# Patient Record
Sex: Female | Born: 1950
Health system: Southern US, Community
[De-identification: ages and names within clinical notes are randomized; demographics above are authoritative.]

## PROBLEM LIST (undated history)

## (undated) DIAGNOSIS — H547 Unspecified visual loss: Secondary | ICD-10-CM

## (undated) DIAGNOSIS — F209 Schizophrenia, unspecified: Secondary | ICD-10-CM

## (undated) DIAGNOSIS — E109 Type 1 diabetes mellitus without complications: Secondary | ICD-10-CM

## (undated) DIAGNOSIS — R42 Dizziness and giddiness: Secondary | ICD-10-CM

## (undated) DIAGNOSIS — K219 Gastro-esophageal reflux disease without esophagitis: Secondary | ICD-10-CM

## (undated) DIAGNOSIS — E785 Hyperlipidemia, unspecified: Secondary | ICD-10-CM

## (undated) DIAGNOSIS — G4730 Sleep apnea, unspecified: Secondary | ICD-10-CM

## (undated) DIAGNOSIS — I10 Essential (primary) hypertension: Secondary | ICD-10-CM

## (undated) DIAGNOSIS — I609 Nontraumatic subarachnoid hemorrhage, unspecified: Secondary | ICD-10-CM

## (undated) DIAGNOSIS — I480 Paroxysmal atrial fibrillation: Secondary | ICD-10-CM

## (undated) HISTORY — DX: Essential (primary) hypertension: I10

## (undated) HISTORY — DX: Unspecified visual loss: H54.7

## (undated) HISTORY — DX: Schizophrenia, unspecified: F20.9

## (undated) HISTORY — DX: Paroxysmal atrial fibrillation: I48.0

## (undated) HISTORY — DX: Nontraumatic subarachnoid hemorrhage, unspecified: I60.9

## (undated) HISTORY — DX: Hyperlipidemia, unspecified: E78.5

## (undated) HISTORY — DX: Dizziness and giddiness: R42

## (undated) HISTORY — DX: Sleep apnea, unspecified: G47.30

## (undated) HISTORY — PX: BRAIN TUMOR EXCISION: SHX577

## (undated) HISTORY — DX: Gastro-esophageal reflux disease without esophagitis: K21.9

## (undated) HISTORY — DX: Type 1 diabetes mellitus without complications: E10.9

---

## 1997-10-30 ENCOUNTER — Ambulatory Visit (HOSPITAL_COMMUNITY): Admission: RE | Admit: 1997-10-30 | Discharge: 1997-10-30 | Payer: Self-pay | Admitting: Cardiology

## 1998-04-30 ENCOUNTER — Encounter: Payer: Self-pay | Admitting: Neurological Surgery

## 1998-05-02 ENCOUNTER — Inpatient Hospital Stay (HOSPITAL_COMMUNITY): Admission: RE | Admit: 1998-05-02 | Discharge: 1998-05-14 | Payer: Self-pay | Admitting: Neurological Surgery

## 1998-05-03 ENCOUNTER — Encounter: Payer: Self-pay | Admitting: Neurological Surgery

## 1998-05-04 ENCOUNTER — Encounter: Payer: Self-pay | Admitting: Neurological Surgery

## 1998-06-08 ENCOUNTER — Encounter: Payer: Self-pay | Admitting: Emergency Medicine

## 1998-06-08 ENCOUNTER — Inpatient Hospital Stay (HOSPITAL_COMMUNITY): Admission: EM | Admit: 1998-06-08 | Discharge: 1998-06-13 | Payer: Self-pay | Admitting: Emergency Medicine

## 1998-07-31 ENCOUNTER — Encounter: Admission: RE | Admit: 1998-07-31 | Discharge: 1998-10-29 | Payer: Self-pay | Admitting: Radiation Oncology

## 1998-08-16 ENCOUNTER — Inpatient Hospital Stay (HOSPITAL_COMMUNITY): Admission: AD | Admit: 1998-08-16 | Discharge: 1998-08-22 | Payer: Self-pay | Admitting: *Deleted

## 1999-01-25 ENCOUNTER — Encounter: Payer: Self-pay | Admitting: Neurological Surgery

## 1999-01-25 ENCOUNTER — Ambulatory Visit (HOSPITAL_COMMUNITY): Admission: RE | Admit: 1999-01-25 | Discharge: 1999-01-25 | Payer: Self-pay | Admitting: Neurological Surgery

## 1999-02-11 ENCOUNTER — Encounter: Admission: RE | Admit: 1999-02-11 | Discharge: 1999-02-11 | Payer: Self-pay | Admitting: Infectious Diseases

## 1999-02-19 ENCOUNTER — Encounter: Admission: RE | Admit: 1999-02-19 | Discharge: 1999-02-19 | Payer: Self-pay | Admitting: Infectious Diseases

## 1999-02-20 ENCOUNTER — Encounter: Admission: RE | Admit: 1999-02-20 | Discharge: 1999-05-21 | Payer: Self-pay | Admitting: Radiation Oncology

## 1999-06-29 ENCOUNTER — Ambulatory Visit (HOSPITAL_COMMUNITY): Admission: RE | Admit: 1999-06-29 | Discharge: 1999-06-29 | Payer: Self-pay | Admitting: Radiation Oncology

## 1999-09-09 ENCOUNTER — Inpatient Hospital Stay (HOSPITAL_COMMUNITY): Admission: EM | Admit: 1999-09-09 | Discharge: 1999-09-11 | Payer: Self-pay | Admitting: *Deleted

## 2000-01-19 ENCOUNTER — Ambulatory Visit (HOSPITAL_COMMUNITY): Admission: RE | Admit: 2000-01-19 | Discharge: 2000-01-19 | Payer: Self-pay | Admitting: Radiation Oncology

## 2000-05-08 ENCOUNTER — Ambulatory Visit (HOSPITAL_COMMUNITY): Admission: RE | Admit: 2000-05-08 | Discharge: 2000-05-08 | Payer: Self-pay | Admitting: Radiation Oncology

## 2000-11-09 ENCOUNTER — Encounter: Payer: Self-pay | Admitting: Family Medicine

## 2000-11-09 ENCOUNTER — Ambulatory Visit (HOSPITAL_COMMUNITY): Admission: RE | Admit: 2000-11-09 | Discharge: 2000-11-09 | Payer: Self-pay | Admitting: Family Medicine

## 2002-05-01 ENCOUNTER — Encounter: Payer: Self-pay | Admitting: Family Medicine

## 2002-05-01 ENCOUNTER — Ambulatory Visit (HOSPITAL_COMMUNITY): Admission: RE | Admit: 2002-05-01 | Discharge: 2002-05-01 | Payer: Self-pay | Admitting: Family Medicine

## 2002-08-08 ENCOUNTER — Encounter: Admission: RE | Admit: 2002-08-08 | Discharge: 2002-08-08 | Payer: Self-pay | Admitting: Oncology

## 2003-03-12 ENCOUNTER — Emergency Department (HOSPITAL_COMMUNITY): Admission: EM | Admit: 2003-03-12 | Discharge: 2003-03-12 | Payer: Self-pay | Admitting: Internal Medicine

## 2003-06-21 ENCOUNTER — Ambulatory Visit (HOSPITAL_COMMUNITY): Admission: RE | Admit: 2003-06-21 | Discharge: 2003-06-21 | Payer: Self-pay | Admitting: Family Medicine

## 2003-10-17 ENCOUNTER — Encounter: Admission: RE | Admit: 2003-10-17 | Discharge: 2003-11-09 | Payer: Self-pay | Admitting: Oncology

## 2003-10-22 ENCOUNTER — Emergency Department (HOSPITAL_COMMUNITY): Admission: EM | Admit: 2003-10-22 | Discharge: 2003-10-22 | Payer: Self-pay | Admitting: Emergency Medicine

## 2003-11-20 ENCOUNTER — Ambulatory Visit: Payer: Self-pay | Admitting: Psychiatry

## 2003-12-06 ENCOUNTER — Ambulatory Visit (HOSPITAL_COMMUNITY): Admission: RE | Admit: 2003-12-06 | Discharge: 2003-12-06 | Payer: Self-pay | Admitting: General Surgery

## 2003-12-20 HISTORY — PX: US ECHOCARDIOGRAPHY: HXRAD669

## 2004-04-25 ENCOUNTER — Emergency Department (HOSPITAL_COMMUNITY): Admission: EM | Admit: 2004-04-25 | Discharge: 2004-04-25 | Payer: Self-pay | Admitting: Emergency Medicine

## 2004-05-15 ENCOUNTER — Ambulatory Visit: Payer: Self-pay | Admitting: Psychiatry

## 2004-10-27 ENCOUNTER — Ambulatory Visit (HOSPITAL_COMMUNITY): Admission: RE | Admit: 2004-10-27 | Discharge: 2004-10-27 | Payer: Self-pay | Admitting: General Surgery

## 2004-11-13 ENCOUNTER — Ambulatory Visit (HOSPITAL_COMMUNITY): Admission: RE | Admit: 2004-11-13 | Discharge: 2004-11-13 | Payer: Self-pay | Admitting: Family Medicine

## 2004-11-13 ENCOUNTER — Ambulatory Visit: Payer: Self-pay | Admitting: Psychiatry

## 2005-05-14 ENCOUNTER — Ambulatory Visit (HOSPITAL_COMMUNITY): Payer: Self-pay | Admitting: Psychiatry

## 2005-06-01 ENCOUNTER — Ambulatory Visit (HOSPITAL_COMMUNITY): Payer: Self-pay | Admitting: Oncology

## 2005-06-01 ENCOUNTER — Encounter: Admission: RE | Admit: 2005-06-01 | Discharge: 2005-06-01 | Payer: Self-pay | Admitting: Oncology

## 2005-06-10 ENCOUNTER — Encounter: Payer: Self-pay | Admitting: Cardiology

## 2005-08-13 ENCOUNTER — Ambulatory Visit (HOSPITAL_COMMUNITY): Payer: Self-pay | Admitting: Psychiatry

## 2005-11-16 ENCOUNTER — Ambulatory Visit (HOSPITAL_COMMUNITY): Admission: RE | Admit: 2005-11-16 | Discharge: 2005-11-16 | Payer: Self-pay | Admitting: Family Medicine

## 2005-11-17 ENCOUNTER — Ambulatory Visit (HOSPITAL_COMMUNITY): Payer: Self-pay | Admitting: Psychiatry

## 2006-03-02 ENCOUNTER — Ambulatory Visit (HOSPITAL_COMMUNITY): Payer: Self-pay | Admitting: Psychiatry

## 2006-05-25 ENCOUNTER — Ambulatory Visit (HOSPITAL_COMMUNITY): Payer: Self-pay | Admitting: Psychiatry

## 2006-08-17 ENCOUNTER — Ambulatory Visit (HOSPITAL_COMMUNITY): Payer: Self-pay | Admitting: Psychiatry

## 2006-11-11 ENCOUNTER — Ambulatory Visit (HOSPITAL_COMMUNITY): Payer: Self-pay | Admitting: Psychiatry

## 2006-11-19 ENCOUNTER — Ambulatory Visit (HOSPITAL_COMMUNITY): Admission: RE | Admit: 2006-11-19 | Discharge: 2006-11-19 | Payer: Self-pay | Admitting: Family Medicine

## 2006-12-01 ENCOUNTER — Ambulatory Visit (HOSPITAL_COMMUNITY): Admission: RE | Admit: 2006-12-01 | Discharge: 2006-12-01 | Payer: Self-pay | Admitting: Family Medicine

## 2007-02-01 ENCOUNTER — Ambulatory Visit (HOSPITAL_COMMUNITY): Payer: Self-pay | Admitting: Psychiatry

## 2007-02-01 HISTORY — PX: NM MYOCAR PERF WALL MOTION: HXRAD629

## 2007-04-28 ENCOUNTER — Ambulatory Visit (HOSPITAL_COMMUNITY): Payer: Self-pay | Admitting: Psychiatry

## 2007-06-06 ENCOUNTER — Ambulatory Visit (HOSPITAL_COMMUNITY): Admission: RE | Admit: 2007-06-06 | Discharge: 2007-06-06 | Payer: Self-pay | Admitting: Family Medicine

## 2007-07-21 ENCOUNTER — Ambulatory Visit (HOSPITAL_COMMUNITY): Payer: Self-pay | Admitting: Psychiatry

## 2007-11-24 ENCOUNTER — Ambulatory Visit (HOSPITAL_COMMUNITY): Payer: Self-pay | Admitting: Psychiatry

## 2008-03-20 ENCOUNTER — Ambulatory Visit (HOSPITAL_COMMUNITY): Payer: Self-pay | Admitting: Psychiatry

## 2008-07-12 ENCOUNTER — Ambulatory Visit (HOSPITAL_COMMUNITY): Payer: Self-pay | Admitting: Psychiatry

## 2008-10-30 ENCOUNTER — Ambulatory Visit (HOSPITAL_COMMUNITY): Payer: Self-pay | Admitting: Psychiatry

## 2009-01-29 ENCOUNTER — Ambulatory Visit (HOSPITAL_COMMUNITY): Payer: Self-pay | Admitting: Psychiatry

## 2009-04-25 ENCOUNTER — Ambulatory Visit (HOSPITAL_COMMUNITY): Payer: Self-pay | Admitting: Psychiatry

## 2009-07-23 ENCOUNTER — Ambulatory Visit (HOSPITAL_COMMUNITY): Payer: Self-pay | Admitting: Psychiatry

## 2009-10-17 ENCOUNTER — Ambulatory Visit (HOSPITAL_COMMUNITY): Payer: Self-pay | Admitting: Psychiatry

## 2009-11-21 ENCOUNTER — Ambulatory Visit (HOSPITAL_COMMUNITY): Payer: Self-pay | Admitting: Psychiatry

## 2010-01-23 ENCOUNTER — Ambulatory Visit (HOSPITAL_COMMUNITY): Payer: Self-pay | Admitting: Psychiatry

## 2010-04-22 ENCOUNTER — Encounter (HOSPITAL_COMMUNITY): Payer: Self-pay | Admitting: Psychiatry

## 2010-04-29 ENCOUNTER — Encounter (INDEPENDENT_AMBULATORY_CARE_PROVIDER_SITE_OTHER): Payer: BC Managed Care – PPO | Admitting: Psychiatry

## 2010-04-29 DIAGNOSIS — F333 Major depressive disorder, recurrent, severe with psychotic symptoms: Secondary | ICD-10-CM

## 2010-06-27 NOTE — Discharge Summary (Signed)
[place]. [facility]  Patient:    Rachel [name], Rachel [name]                 MRN: [id number] Adm. Date:  [id number] Disc. Date: [id number] Attending:  Lenise Herald H Dictator:   Marya Fossa, P.A. CC:         Lenise Herald, M.D.             Dr. Greta Doom, [place]                           Discharge Summary  ADDENDUM  DATE OF BIRTH:  [birth year]  A 2-D echocardiogram was performed on the patient before she left [facility].  This was read by Dr. Jenne Campus as normal LV size, normal systolic function with an EF of 60%.  Mild TR and mild MR.  Normal aortic valve. Mildly dilated and depressed RV systolic function.  No significant pericardial effusion. DD:  09/11/99 TD:  09/13/99 Job: [id number]

## 2010-06-27 NOTE — H&P (Signed)
Rachel [name], ZAHRADNIK          ACCOUNT NO.:  [id number]   MEDICAL RECORD NO.:  [id number]          PATIENT TYPE:  AMB   LOCATION:  DAY                           FACILITY:  [facility]   PHYSICIAN:  Jerolyn Shin C. Katrinka Blazing, M.D.   DATE OF BIRTH:  1950-12-02   DATE OF ADMISSION:  DATE OF DISCHARGE:  LH                                HISTORY & PHYSICAL   HISTORY OF PRESENT ILLNESS:  A 60 year old female referred for screening  colonoscopy.  The patient has no difficulty with bowel movements, except for  occasional episodes of constipation.  She has some gas pain.  There is no  nausea or vomiting.  No family history of colon cancer.   PAST HISTORY:  1.  Diabetes mellitus.  2.  Hypertension.  3.  Chronic atrial fibrillation.  4.  Schizophrenia.  5.  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   MEDICATIONS:  1.  Xanax.  2.  Paxil 30 mg daily.  3.  KCl 10 mEq daily.  4.  Diltiazem 180 mg daily.  5.  Pepcid 20 mg daily.  6.  Digoxin 0.125 mg daily.  7.  Aspirin 325 mg daily.  8.  Trazodone 50 mg q.h.s.  9.  Risperdal 1 mg q.h.s.  10. Neurontin 300 mg q.h.s.   SURGERY:  Decompression at the base of the brain for history of cytoma.   ALLERGIES:  1.  SULFA.  2.  IBUPROFEN.   PHYSICAL EXAMINATION:  VITAL SIGNS:  Blood pressure 120/80, pulse 76,  respirations 18, weight 212 pounds.  GENERAL:  The patient appears to be a somewhat withdrawn female with flat  affect.  She does __________.  HEENT:  Otherwise unremarkable.  NECK:  Supple.  CHEST:  Clear.  HEART:  Regular rate and rhythm without murmur, gallop, or rub.  ABDOMEN:  Soft, obese, nontender.  No masses.  EXTREMITIES:  No clubbing, cyanosis, or edema.  NEUROLOGIC:  Tight musculature in upper and lower extremities.  She had a  wide based, unsteady gait.  She has hyperreflexia in upper and lower  extremities.  There is no lateralizing motor weakness.   IMPRESSION:  1.  Need for screening colonoscopy.  2.  Diabetes mellitus.  3.   Hypertension.  4.  History of chronic atrial fibrillation.  5.  Schizophrenia.  6.  History of cytoma status post surgical decompression with radiation and      chemotherapy.   PLAN:  Screening colonoscopy.     Lero   LCS/MEDQ  D:  12/05/2003  T:  12/06/2003  Job:  [id number]

## 2010-06-27 NOTE — Discharge Summary (Signed)
[place]. [facility]  Patient:    Rachel [name], Rachel [name]                 MRN: [id number] Adm. Date:  [id number] Disc. Date: [id number] Attending:  Lenise Herald H Dictator:   Marya Fossa, P.A. CC:         Dr. Greta Doom, [place]   Discharge Summary  DATE OF BIRTH:  04/19/2050  ADMISSION DIAGNOSES: 1. New onset rapid atrial fibrillation, status post emergent direct current    cardioversion. 2. Cardiogenic shock, resolved. 3. Hypertension. 4. Schizophrenia. 5. History of brain tumor, status post resection and chemotherapy. 6. Insulin-dependent diabetes mellitus.  DISCHARGE DIAGNOSES: 1. Status post direct current cardioversion from rapid atrial fibrillation    with cardiogenic shock to normal sinus rhythm.  Not a Coumadin candidate    due to schizophrenia. 2. Cardiogenic shock, resolved. 3. Hypertension. 4. Schizophrenia. 5. History of brain tumor, status post resection and chemotherapy. 6. Insulin-dependent diabetes mellitus. 7. Hypokalemia, repleted.  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  She presented to the [facility] ER after having a syncopal episode while walking at home. It was witnessed.  The patient was lethargic and unable to fully answer questions.  In the emergency room, she was noted to be in atrial fibrillation with a rapid ventricular response with a ventricular rate 150-180 and systolic blood pressure of 70-80.  No history of coronary artery disease or arrhythmia per family.  No prior history of atrial fibrillation.  She does have a history of syncope in the past six months x 2 and told it was due to hypokalemia.  No chest pain.  Mild shortness of breath.  The patient underwent  emergency DC cardioversion x 1 with 300 joules to sinus tachycardia at 105-115 with a systolic pressure of 90 by Lenise Herald, M.D., at [facility] after being given Versed IV.  The patient was alert and oriented postprocedure and was to be transferred to [facility] for further management to rule out MI and check 2-D echocardiogram and TSH.  PROCEDURES:  Direct current cardioversion emergently at [facility] on September 09, 1999.  COMPLICATIONS:  None.  CONSULTATIONS:  Stefani Dama, M.D., to clear for anticoagulation.  HOSPITAL COURSE:  The patient was stabilized at [facility] after DC cardioversion for atrial fibrillation and hypotension and stabilized to sinus rhythm with stable blood pressure.  She was then transferred to [facility] to a telemetry bed.  Laboratory studies revealed a sodium of 137, potassium 3.7, BUN 8, creatinine 1.0, and glucose 156.  WBC 11.6, hemoglobin 11.4, platelets 192.  Cardiac enzymes negative, except for MB and mild troponin leak.  CK 82, 88, and 81. MB 8.5, 8.3, and 5.5.  Troponin I 0.29, 0.36, and 0.26.  Total cholesterol 155, triglycerides 53, HDL 68, LDL 76.  Drug screen positive for benzodiazepines.  TSH 1.36.  The patient remained stable and in sinus rhythm on p.o. Cardizem and IV heparin.  Digoxin p.o. was also started.  Stefani Dama, M.D., was called and he felt from a neurosurgical standpoint that the patient would be okay to go on anticoagulation status post craniotomy remotely.  However, because of her history of schizophrenia, Lenise Herald, M.D., felt that it  would be best to put the patient on aspirin only.  DISCHARGE MEDICATIONS:  1. Diltiazem CD 120 mg a day.  2. Enteric-coated aspirin 325 mg a day.  3. Trazodone 50 mg one-half a day.  4. Premarin 0.625 mg a day.  5. Provera 2.5 mg a day.  6. Pepcid 20 mg a day.  7. Paxil 30 mg a  day.  8. Lanoxin 0.125 mg a day.  9. Risperdal 1 mg a day. 10. Xanax 0.25 mg twice a day. 11. Insulin as at home.  SPECIAL INSTRUCTIONS:  She is to stop the Hyzaar as she had a K of 3.5 on September 10, 1999, which was treated with 40 mg of p.o. potassium.  FOLLOW-UP:  We will check a BMP and digoxin level as an outpatient next Wednesday.  Follow-up appointment with Lenise Herald, M.D., on September 30, 1999, at 11:55 a[place].  DIET:  She should follow a low-fat, low-cholesterol, low-salt, diabetic diet.  ACTIVITY:  She may perform activity as tolerated.  WOUND CARE:  She can apply hydrocortisone cream to her DC cardioversion burn if needed. DD:  09/11/99 TD:  09/13/99 Job: [id number]

## 2010-07-24 ENCOUNTER — Encounter (INDEPENDENT_AMBULATORY_CARE_PROVIDER_SITE_OTHER): Payer: BC Managed Care – PPO | Admitting: Psychiatry

## 2010-07-24 DIAGNOSIS — F333 Major depressive disorder, recurrent, severe with psychotic symptoms: Secondary | ICD-10-CM

## 2010-09-11 ENCOUNTER — Encounter (INDEPENDENT_AMBULATORY_CARE_PROVIDER_SITE_OTHER): Payer: BC Managed Care – PPO | Admitting: Psychiatry

## 2010-09-11 DIAGNOSIS — F333 Major depressive disorder, recurrent, severe with psychotic symptoms: Secondary | ICD-10-CM

## 2010-10-09 ENCOUNTER — Encounter (HOSPITAL_COMMUNITY): Payer: BC Managed Care – PPO | Admitting: Psychiatry

## 2010-10-28 ENCOUNTER — Encounter (INDEPENDENT_AMBULATORY_CARE_PROVIDER_SITE_OTHER): Payer: BC Managed Care – PPO | Admitting: Psychiatry

## 2010-10-28 DIAGNOSIS — F333 Major depressive disorder, recurrent, severe with psychotic symptoms: Secondary | ICD-10-CM

## 2010-11-27 ENCOUNTER — Encounter (INDEPENDENT_AMBULATORY_CARE_PROVIDER_SITE_OTHER): Payer: BC Managed Care – PPO | Admitting: Psychiatry

## 2010-11-27 DIAGNOSIS — F333 Major depressive disorder, recurrent, severe with psychotic symptoms: Secondary | ICD-10-CM

## 2011-01-15 ENCOUNTER — Ambulatory Visit (INDEPENDENT_AMBULATORY_CARE_PROVIDER_SITE_OTHER): Payer: BC Managed Care – PPO | Admitting: Psychiatry

## 2011-01-15 DIAGNOSIS — F331 Major depressive disorder, recurrent, moderate: Secondary | ICD-10-CM

## 2011-01-15 MED ORDER — PAROXETINE HCL 40 MG PO TABS: 40.0000 mg | ORAL_TABLET | Freq: Every day | ORAL | Status: DC

## 2011-01-15 MED ORDER — TRAZODONE HCL 150 MG PO TABS: 150.0000 mg | ORAL_TABLET | Freq: Every day | ORAL | Status: DC

## 2011-01-15 MED ORDER — ALPRAZOLAM 0.25 MG PO TABS: 0.2500 mg | ORAL_TABLET | Freq: Three times a day (TID) | ORAL | Status: DC | PRN

## 2011-01-15 NOTE — Progress Notes (Signed)
Patient came for her followup appointment. She comes with her husband. She is now taking Risperdal 1 mg at bedtime. As per husband patient is doing better on her current medication. She is still uses her left to 2 mg Risperdal which was given few months ago. Since her dose has been decreased she is using remaining Risperdal in half. She denies any anger or agitation or mood swings. She continues to have nightmare but they are less intense and less frequent. She reported no side effects of medication. Her blood pressure and blood sugar has been improved from the past. She reported no side effects of medication.  Mental status examination Patient is casually dressed and fairly groomed. Patient is legally blind in need her husband support and assistance for walking. She described her mood is anxious and her affect is mood congruent. She denies any active or passive suicidal thinking and homicidal thinking. Her thought process is logical linear and goal-directed. There are no psychotic symptoms present. She denies any auditory or visual hallucination. She's alert and oriented x3 her insight judgment and impulse control is okay. There no tremors shakes or extrapyramidal side effects noted.  Assessment Maj. depressive disorder with psychotic features and complete remission  Plan I recommended to try Risperdal 0.5 mg. Eventually we will discontinue her Risperdal a few months. Patient will continue her Xanax Paxil and trazodone at present dosage. A new prescription of all 3 medications are given. I explained risks and benefits of medication in detail. I recommended to call if she has any question or concern about the medication or if she start to feel worsening of her depression or any time having paranoid thinking or hallucinations. I will see her again in 3 months

## 2011-01-23 ENCOUNTER — Other Ambulatory Visit (HOSPITAL_COMMUNITY): Payer: Self-pay | Admitting: Psychiatry

## 2011-02-19 ENCOUNTER — Encounter (HOSPITAL_COMMUNITY): Payer: BC Managed Care – PPO | Admitting: Psychiatry

## 2011-02-19 ENCOUNTER — Ambulatory Visit (HOSPITAL_COMMUNITY): Payer: BC Managed Care – PPO | Admitting: Psychiatry

## 2011-03-05 ENCOUNTER — Ambulatory Visit (HOSPITAL_COMMUNITY)
Admission: RE | Admit: 2011-03-05 | Discharge: 2011-03-05 | Disposition: A | Discharge status: Home / Self Care | Payer: BC Managed Care – PPO | Source: Ambulatory Visit | Attending: Family Medicine | Admitting: Family Medicine

## 2011-03-05 ENCOUNTER — Other Ambulatory Visit (HOSPITAL_COMMUNITY): Payer: Self-pay | Admitting: Family Medicine

## 2011-03-05 DIAGNOSIS — R52 Pain, unspecified: Secondary | ICD-10-CM

## 2011-03-05 DIAGNOSIS — S199XXA Unspecified injury of neck, initial encounter: Secondary | ICD-10-CM | POA: Insufficient documentation

## 2011-03-05 DIAGNOSIS — W19XXXA Unspecified fall, initial encounter: Secondary | ICD-10-CM | POA: Insufficient documentation

## 2011-03-05 DIAGNOSIS — R221 Localized swelling, mass and lump, neck: Secondary | ICD-10-CM | POA: Insufficient documentation

## 2011-03-05 DIAGNOSIS — S0993XA Unspecified injury of face, initial encounter: Secondary | ICD-10-CM | POA: Insufficient documentation

## 2011-03-05 DIAGNOSIS — R220 Localized swelling, mass and lump, head: Secondary | ICD-10-CM | POA: Insufficient documentation

## 2011-03-05 DIAGNOSIS — R51 Headache: Secondary | ICD-10-CM | POA: Insufficient documentation

## 2011-04-14 ENCOUNTER — Ambulatory Visit (INDEPENDENT_AMBULATORY_CARE_PROVIDER_SITE_OTHER): Payer: BC Managed Care – PPO | Admitting: Psychiatry

## 2011-04-14 DIAGNOSIS — F329 Major depressive disorder, single episode, unspecified: Secondary | ICD-10-CM

## 2011-04-14 MED ORDER — ALPRAZOLAM 0.25 MG PO TABS: 0.2500 mg | ORAL_TABLET | Freq: Three times a day (TID) | ORAL | Status: DC | PRN

## 2011-04-14 MED ORDER — PAROXETINE HCL 40 MG PO TABS: 40.0000 mg | ORAL_TABLET | Freq: Every day | ORAL | Status: DC

## 2011-04-14 MED ORDER — RISPERIDONE 1 MG PO TABS: 1.0000 mg | ORAL_TABLET | Freq: Every day | ORAL | Status: DC

## 2011-04-14 MED ORDER — TRAZODONE HCL 150 MG PO TABS: 150.0000 mg | ORAL_TABLET | Freq: Every day | ORAL | Status: DC

## 2011-04-14 NOTE — Progress Notes (Signed)
Patient came for her followup appointment with her husband. She tried reducing Risperdal to 0.5 mg however she cannot sleep very well. She complained of racing thoughts nervousness and her depression getting worse. Patient is back on 1 mg of Risperdal. She is doing much better with 1 mg.  She denies any agitation anger or mood swings. She denies any paranoid thinking. There no tremors or shakes present. She has recently seen her primary care physician and now taking digoxin, Crestor and Diovan. I review her blood work which was done in January shows blood glucose of 163. She's also taking insulin husband is very concerned with lowering Risperdal and does not want to change the dose. Her mood and depression as much stable with 1 mg Risperdal.  Current psychiatric medication Paxil 40 mg daily Trazodone 150 mg at bedtime Xanax 0.25 mg 3 times a day Risperdal 1 mg at bedtime  Mental status examination Patient is casually dressed and fairly groomed. Patient is legally blind and need her husband`s support and assistance for walking. She described her mood is neutral and her affect is mood congruent. She denies any active or passive suicidal thinking and homicidal thinking. Her thought process is logical linear and goal-directed. There are no psychotic symptoms present. She denies any auditory or visual hallucination. She's alert and oriented x3 her insight judgment and impulse control is okay. There no tremors shakes or extrapyramidal side effects noted.  Assessment Axis I Maj. depressive disorder with psychotic features Axis II deferred Axis III see medical history Axis IV mild to moderate   Plan I will increase Risperdal to 1 mg to target the psychosis and depressive symptoms. At this time patient is not ready to take low dose of Risperdal. I encourage her to control her blood sugar with diet exercise and compliant with her insulin. Her weight is unchanged from the past. I've explained risks and  benefits of medication. I recommended to call if she has any question or concern about the medication or if she start to feel worsening of her depression or any time having paranoid thinking or hallucinations. I will see her again in 3 months

## 2011-07-14 ENCOUNTER — Ambulatory Visit (INDEPENDENT_AMBULATORY_CARE_PROVIDER_SITE_OTHER): Payer: BC Managed Care – PPO | Admitting: Psychiatry

## 2011-07-14 ENCOUNTER — Encounter (HOSPITAL_COMMUNITY): Payer: Self-pay | Admitting: Psychiatry

## 2011-07-14 VITALS — Wt 196.0 lb

## 2011-07-14 DIAGNOSIS — F329 Major depressive disorder, single episode, unspecified: Secondary | ICD-10-CM

## 2011-07-14 MED ORDER — ALPRAZOLAM 0.25 MG PO TABS: 0.2500 mg | ORAL_TABLET | Freq: Three times a day (TID) | ORAL | Status: DC | PRN

## 2011-07-14 MED ORDER — PAROXETINE HCL 40 MG PO TABS: 40.0000 mg | ORAL_TABLET | Freq: Every day | ORAL | Status: DC

## 2011-07-14 MED ORDER — RISPERIDONE 1 MG PO TABS: 1.0000 mg | ORAL_TABLET | Freq: Every day | ORAL | Status: DC

## 2011-07-14 MED ORDER — TRAZODONE HCL 150 MG PO TABS: 150.0000 mg | ORAL_TABLET | Freq: Every day | ORAL | Status: DC

## 2011-07-14 NOTE — Progress Notes (Signed)
Chief complaint Medication management and followup.  History of presenting illness Patient is 60 year-old Caucasian unemployed married female who came with her husband for her followup appointment .  Patient is legally blind.  She require support and assistance .  Her husband usually comes with her on appointments.  Patient is overall doing better on her current psychiatric medication.  She denies any recent agitation anger or any hallucination.  She has chronic insomnia however her paranoia and depression has been stable.  We had tried cutting down her Risperdal in the past however she started to have racing thoughts and paranoid thinking.  She is taking 1 mg of Risperdal and overall she is doing better.  She has no tremors or shakes.  Her last blood work was done in January 2013 by Dr. Loleta Chance.  Her glucose was 163 however her creatinine was normal get she has been seeing her primary care physician regularly.  Her last hemoglobin A1c was 8.3 which was done in August 2013.  Patient is scheduled to see her primary care physician in few weeks.  Her weight is 196 pounds.  Current psychiatric medication Paxil 40 mg daily Trazodone 150 mg at bedtime Xanax 0.25 mg 3 times a day Risperdal 1 mg at bedtime  Past psychiatric history Patient has been seeing in this office since 1998.  She was admitted at [facility] due to severe depression and psychotic features.  At that time she was having suicidal thoughts .  Since then she has been stable on current psychiatric medication.  Medical history Patient has history of vertigo, GERD, hyperlipidemia, diabetes mellitus.  Patient is also legally blind.  She see Dr. Jorge Ny for her annual checkup.  Mental status examination Patient is casually dressed and fairly groomed. Patient is legally blind and need her husband`s support and assistance for walking. She described her mood is neutral and her affect is mood congruent. She denies any active or passive  suicidal thinking and homicidal thinking. Her thought process is logical linear and goal-directed. There are no psychotic symptoms present. She denies any auditory or visual hallucination.  She has poverty of thought content.  She's alert and oriented x3 her insight judgment and impulse control is okay. There no tremors shakes or extrapyramidal side effects noted.  Assessment Axis I Maj. depressive disorder with psychotic features Axis II deferred Axis III see medical history Axis IV mild to moderate   Plan I will continue her current psychiatric medication.  Patient does not want to reduce her medication at this time.  I recommend to call us if she is a question or concern about the medication or if she feels worsening of the symptoms.  I will see her again in 3 months.   i

## 2011-10-01 ENCOUNTER — Encounter (HOSPITAL_COMMUNITY): Payer: Self-pay | Admitting: Psychiatry

## 2011-10-01 ENCOUNTER — Ambulatory Visit (INDEPENDENT_AMBULATORY_CARE_PROVIDER_SITE_OTHER): Payer: BC Managed Care – PPO | Admitting: Psychiatry

## 2011-10-01 DIAGNOSIS — F323 Major depressive disorder, single episode, severe with psychotic features: Secondary | ICD-10-CM

## 2011-10-01 DIAGNOSIS — F329 Major depressive disorder, single episode, unspecified: Secondary | ICD-10-CM

## 2011-10-01 MED ORDER — TRAZODONE HCL 150 MG PO TABS: 150.0000 mg | ORAL_TABLET | Freq: Every day | ORAL | Status: DC

## 2011-10-01 MED ORDER — PAROXETINE HCL 40 MG PO TABS: 40.0000 mg | ORAL_TABLET | Freq: Every day | ORAL | Status: DC

## 2011-10-01 MED ORDER — ALPRAZOLAM 0.25 MG PO TABS: 0.2500 mg | ORAL_TABLET | Freq: Three times a day (TID) | ORAL | Status: DC | PRN

## 2011-10-01 MED ORDER — RISPERIDONE 1 MG PO TABS: 1.0000 mg | ORAL_TABLET | Freq: Every day | ORAL | Status: DC

## 2011-10-01 NOTE — Progress Notes (Signed)
Chief complaint Medication management and followup.  History of presenting illness Patient came for her followup appointment with her husband.  She's been compliant with the medication and reported no side effects.  She's been seeing primary care physician Dr. Loleta Chance for management of diabetes.  She is scheduled to have blood work in October.  She likes her current psychiatric medication.  She has chronic insomnia.  She's also legally blind and require her husband support for walking.  Her husband is very supportive.  She denies any recent agitation anger mood swing.  She denies any recent paranoia or any hallucination.  She is no tremors or shakes.  Her last blood work which was done in January 2000 13 shows hemoglobin A1c 8.3.  She will have another blood work in October this year.  Current psychiatric medication Paxil 40 mg daily Trazodone 150 mg at bedtime Xanax 0.25 mg 3 times a day Risperdal 1 mg at bedtime  Past psychiatric history Patient has been seeing in this office since 1998.  She was admitted at [facility] due to severe depression and psychotic features.  At that time she was having suicidal thoughts .  Since then she has been stable on current psychiatric medication.  Medical history Patient has history of vertigo, GERD, hyperlipidemia, diabetes mellitus.  Patient is also legally blind.  She see Dr. Jorge Ny for her annual checkup.  Mental status examination Patient is casually dressed and fairly groomed. Patient is legally blind and need her husband`s support and assistance for walking. She described her mood is neutral and her affect is mood congruent. She denies any active or passive suicidal thinking and homicidal thinking. Her thought process is logical linear and goal-directed. There are no psychotic symptoms present. She denies any auditory or visual hallucination.  She has poverty of thought content.  She's alert and oriented x3 her insight judgment and impulse control  is okay. There no tremors shakes or extrapyramidal side effects noted.  Assessment Axis I Maj. depressive disorder with psychotic features Axis II deferred Axis III see medical history Axis IV mild to moderate   Plan I review her chart and last progress note.  She is fairly stable on her current psychiatric medication.  House continue her current psychiatric medication.  I encourage her to have her blood work faxed to [place] in the future.  I explained the risks and benefits of medication recommend to call us if she is a question or concern about the medication or if she feels worsening of the symptoms.  I will see her again in 3 months.

## 2011-12-29 ENCOUNTER — Ambulatory Visit (INDEPENDENT_AMBULATORY_CARE_PROVIDER_SITE_OTHER): Payer: BC Managed Care – PPO | Admitting: Psychiatry

## 2011-12-29 ENCOUNTER — Encounter (HOSPITAL_COMMUNITY): Payer: Self-pay | Admitting: Psychiatry

## 2011-12-29 VITALS — BP 130/84 | HR 68 | Ht 67.5 in | Wt 198.2 lb

## 2011-12-29 DIAGNOSIS — F5105 Insomnia due to other mental disorder: Secondary | ICD-10-CM | POA: Insufficient documentation

## 2011-12-29 DIAGNOSIS — R6882 Decreased libido: Secondary | ICD-10-CM

## 2011-12-29 DIAGNOSIS — F329 Major depressive disorder, single episode, unspecified: Secondary | ICD-10-CM

## 2011-12-29 DIAGNOSIS — F323 Major depressive disorder, single episode, severe with psychotic features: Secondary | ICD-10-CM

## 2011-12-29 MED ORDER — PAROXETINE HCL 40 MG PO TABS: 40.0000 mg | ORAL_TABLET | Freq: Every day | ORAL | Status: DC

## 2011-12-29 MED ORDER — RISPERIDONE 1 MG PO TABS: 1.0000 mg | ORAL_TABLET | Freq: Every day | ORAL | Status: DC

## 2011-12-29 MED ORDER — ALPRAZOLAM 0.25 MG PO TABS: 0.2500 mg | ORAL_TABLET | Freq: Three times a day (TID) | ORAL | Status: DC | PRN

## 2011-12-29 MED ORDER — CYPROHEPTADINE HCL 4 MG PO TABS: 4.0000 mg | ORAL_TABLET | ORAL | Status: DC | PRN

## 2011-12-29 MED ORDER — TRAZODONE HCL 150 MG PO TABS: 150.0000 mg | ORAL_TABLET | Freq: Every day | ORAL | Status: DC

## 2011-12-29 NOTE — Patient Instructions (Signed)
Try the Periactin about 2 hours before the spark returns.  May try two if one doesn't help enough.

## 2011-12-29 NOTE — Progress Notes (Signed)
Chief complaint Medication management and followup.  History of presenting illness Patient came for her followup appointment with her husband.  Her husband reports that she is doing okay on the current regimen.  She is on Trazodone, Neurontin, Risperdal and Xanax for insomna.  Everything else is going pretty well.  Her mood has been pretty positive.  They deny any problems with decreased libido.  They say that they are able to be sexually active.  Her anxiety is under good control.  Later the pt says that they do not have sex and her desire has faded pretty badly.  Current psychiatric medication Paxil 40 mg daily Trazodone 150 mg at bedtime Xanax 0.25 mg 3 times a day Risperdal 1 mg at bedtime  Past psychiatric history Patient has been seeing in this office since 1998.  She was admitted at [facility] due to severe depression and psychotic features.  At that time she was having suicidal thoughts .  Since then she has been stable on current psychiatric medication.  Medical history Patient has history of vertigo, GERD, hyperlipidemia, diabetes mellitus.  Patient is also legally blind.  She see Dr. Jorge Ny for her annual checkup.  Mental status examination Patient is casually dressed and fairly groomed. Patient is legally blind and need her husband`s support and assistance for walking. She described her mood is neutral and her affect is mood congruent. She denies any active or passive suicidal thinking and homicidal thinking. Her thought process is logical linear and goal-directed. There are no psychotic symptoms present. She denies any auditory or visual hallucination.  She has poverty of thought content.  She's alert and oriented x3 her insight judgment and impulse control is okay. There no tremors shakes or extrapyramidal side effects noted.  Assessment Axis I Maj. depressive disorder with psychotic features Axis II deferred Axis III see medical history Axis IV mild to moderate    Plan I reviewed CC, tobacco/med/surg Hx, meds effects/ side effects, problem list, therapies and responses also reviewed the FOUR medications that she is on for insomnia.  The husband seems to think that she needs all four to get to sleep.  Will add Periactin for the lack of libido.

## 2012-01-05 ENCOUNTER — Telehealth (HOSPITAL_COMMUNITY): Payer: Self-pay | Admitting: Psychiatry

## 2012-01-05 NOTE — Telephone Encounter (Signed)
Sent fax clarifying directions TID PRN

## 2012-01-26 ENCOUNTER — Telehealth (HOSPITAL_COMMUNITY): Payer: Self-pay | Admitting: Psychiatry

## 2012-01-26 NOTE — Telephone Encounter (Signed)
Husband called wanting to cancel the appointment for tomorrow and instead wanted to just schedule in March.  I am not willing to renew the Xanax for that long with out seeing her.  Relayed this info to Ruby who took the call.

## 2012-01-27 ENCOUNTER — Ambulatory Visit (HOSPITAL_COMMUNITY): Payer: Self-pay | Admitting: Psychiatry

## 2012-01-29 ENCOUNTER — Telehealth (HOSPITAL_COMMUNITY): Payer: Self-pay | Admitting: *Deleted

## 2012-04-01 ENCOUNTER — Encounter (HOSPITAL_COMMUNITY): Payer: Self-pay | Admitting: Psychiatry

## 2012-04-01 ENCOUNTER — Ambulatory Visit (INDEPENDENT_AMBULATORY_CARE_PROVIDER_SITE_OTHER): Payer: BC Managed Care – PPO | Admitting: Psychiatry

## 2012-04-01 VITALS — Wt 199.8 lb

## 2012-04-01 DIAGNOSIS — Z79899 Other long term (current) drug therapy: Secondary | ICD-10-CM

## 2012-04-01 DIAGNOSIS — R6882 Decreased libido: Secondary | ICD-10-CM

## 2012-04-01 DIAGNOSIS — F5105 Insomnia due to other mental disorder: Secondary | ICD-10-CM

## 2012-04-01 DIAGNOSIS — F32A Depression, unspecified: Secondary | ICD-10-CM

## 2012-04-01 DIAGNOSIS — F323 Major depressive disorder, single episode, severe with psychotic features: Secondary | ICD-10-CM

## 2012-04-01 DIAGNOSIS — F329 Major depressive disorder, single episode, unspecified: Secondary | ICD-10-CM

## 2012-04-01 LAB — HEMOGLOBIN A1C
Hgb A1c MFr Bld: 10.3 % — ABNORMAL HIGH (ref <5.7)
Mean Plasma Glucose: 249 mg/dL — ABNORMAL HIGH (ref <117)

## 2012-04-01 MED ORDER — RISPERIDONE 1 MG PO TABS: 1.0000 mg | ORAL_TABLET | Freq: Every day | ORAL | Status: DC

## 2012-04-01 MED ORDER — ALPRAZOLAM 0.25 MG PO TABS: 0.2500 mg | ORAL_TABLET | Freq: Three times a day (TID) | ORAL | Status: DC | PRN

## 2012-04-01 MED ORDER — PAROXETINE HCL 40 MG PO TABS: 40.0000 mg | ORAL_TABLET | Freq: Every day | ORAL | Status: DC

## 2012-04-01 MED ORDER — TRAZODONE HCL 150 MG PO TABS: 150.0000 mg | ORAL_TABLET | Freq: Every day | ORAL | Status: DC

## 2012-04-01 MED ORDER — CYPROHEPTADINE HCL 4 MG PO TABS: 4.0000 mg | ORAL_TABLET | ORAL | Status: DC | PRN

## 2012-04-01 NOTE — Patient Instructions (Signed)
Walking 8 minutes a day and increasing this gradually, BUT keeping a record and bringing that record in to the next visit would be very helpful.  Explore Pand[employer] on the Internet.for music that you like, that relaxes you.  Relaxation is the ultimate solution for you.  You can seek it through tub baths, bubble baths, essential oils or incense, walking or chatting with friends, listening to soft music, watching a candle burn and just letting all thoughts go and appreciating the true essence of the Creator.   Yoga is a very helpful exercise method.  On TV or on line [employer] is a source of high quality information about yoga and videos on yoga.  Renee Ramus is the world's number one video yoga instructor according to some experts.  There are exceptional health benefits that can be achieved through yoga.  The main principles of yoga is acceptance, no competition, no comparison, and no judgement.  It is exceptional in helping people meditate and get to a very relaxed state.   Call if problems or concerns.

## 2012-04-01 NOTE — Progress Notes (Addendum)
[facility] [id number] Progress Note Rachel [name] MRN: [id number] DOB: Jul 01, 1950 Age: 62 y.o.  Date: 04/01/2012 Start Time: 10:15 AM End Time: 10:40 AM  Chief Complaint: Chief Complaint  Patient presents with  . Depression  . Follow-up  . Medication Refill   Subjective: "I'm doing good". Depression 0/10 and Anxiety 0/10, where 1 is the best and 10 is the worst.  Pain is 0/10 also.  History of presenting illness Patient came for her followup appointment with her husband.  Apparently the insurance didn't cover the Periactin and they did not get that filled.  Pt reports that she is compliant with the psychotropic medications with good benefit and no noticeable side effects.  She has been having some headaches.  Tylenol usually helps them.  Discussed a walking plan for her.  She has a treadmill in the home.  Suggested that they get videos   Current psychiatric medication Paxil 40 mg daily Trazodone 150 mg at bedtime Xanax 0.25 mg 3 times a day Risperdal 1 mg at bedtime  Past psychiatric history Patient has been seeing in this office since 1998.  She was admitted at [facility] due to severe depression and psychotic features.  At that time she was having suicidal thoughts .  Since then she has been stable on current psychiatric medication.  Medical history Patient has history of vertigo, GERD, hyperlipidemia, diabetes mellitus.  Patient is also legally blind.  She see Dr. Jorge Ny for her annual checkup.  Mental status examination Patient is casually dressed and fairly groomed. Patient is legally blind and need her husband`s support and assistance for walking. She described her mood is neutral and her affect is mood congruent. She denies any active or passive suicidal thinking and homicidal thinking. Her thought process is logical linear and goal-directed. There are no psychotic symptoms present. She denies any auditory or visual hallucination.  She has poverty of  thought content.  She's alert and oriented x3 her insight judgment and impulse control is okay. There no tremors shakes or extrapyramidal side effects noted.  Lab Results: No results found for this or any previous visit (from the past 8736 hour(s)). Has labs ordered.  Will add A1c to that.   Assessment Axis I Maj. depressive disorder with psychotic features Axis II deferred Axis III see medical history Axis IV mild to moderate   Plan: I took her vitals.  I reviewed CC, tobacco/med/surg Hx, meds effects/ side effects, problem list, therapies and responses as well as current situation/symptoms discussed options. See orders and pt instructions for more details.  Medical Decision Making Problem Points:  Established problem, stable/improving (1), Review of last therapy session (1) and Review of psycho-social stressors (1) Data Points:  Review or order medicine tests (1) Review of medication regiment & side effects (2)  I certify that outpatient services furnished can reasonably be expected to improve the patient's condition.   Orson Aloe, MD, [facility]  Addendum:  04/05/2012 Hemoglobin A1c is elevated.  Left message on phone indicating the initials of pt and that a lab was of concern and needed to be reported/discussed with PCP. Orson Aloe, MD, [facility]  Addendum:  04/18/2012 Again called and spoke with pt and asked if she had discussed this with her PCP.  She reported that she had not yet.  She was encouraged to do so soon. Orson Aloe, MD, [facility]

## 2012-04-08 ENCOUNTER — Ambulatory Visit (HOSPITAL_COMMUNITY): Payer: Self-pay | Admitting: Psychiatry

## 2012-06-14 ENCOUNTER — Encounter (HOSPITAL_COMMUNITY): Payer: Self-pay | Admitting: Psychiatry

## 2012-06-14 ENCOUNTER — Ambulatory Visit (INDEPENDENT_AMBULATORY_CARE_PROVIDER_SITE_OTHER): Payer: BC Managed Care – PPO | Admitting: Psychiatry

## 2012-06-14 VITALS — BP 128/79 | HR 76 | Ht 66.5 in | Wt 197.0 lb

## 2012-06-14 DIAGNOSIS — R6882 Decreased libido: Secondary | ICD-10-CM

## 2012-06-14 DIAGNOSIS — F5105 Insomnia due to other mental disorder: Secondary | ICD-10-CM

## 2012-06-14 DIAGNOSIS — F323 Major depressive disorder, single episode, severe with psychotic features: Secondary | ICD-10-CM

## 2012-06-14 DIAGNOSIS — F329 Major depressive disorder, single episode, unspecified: Secondary | ICD-10-CM

## 2012-06-14 MED ORDER — PAROXETINE HCL 40 MG PO TABS: 40.0000 mg | ORAL_TABLET | Freq: Every day | ORAL | Status: DC

## 2012-06-14 MED ORDER — TRAZODONE HCL 100 MG PO TABS: 200.0000 mg | ORAL_TABLET | Freq: Every day | ORAL | Status: DC

## 2012-06-14 MED ORDER — CYPROHEPTADINE HCL 4 MG PO TABS: 4.0000 mg | ORAL_TABLET | ORAL | Status: DC | PRN

## 2012-06-14 MED ORDER — RISPERIDONE 1 MG PO TABS: 1.0000 mg | ORAL_TABLET | Freq: Every day | ORAL | Status: DC

## 2012-06-14 MED ORDER — ALPRAZOLAM 0.25 MG PO TABS: 0.2500 mg | ORAL_TABLET | Freq: Three times a day (TID) | ORAL | Status: DC | PRN

## 2012-06-14 NOTE — Progress Notes (Signed)
[facility] [address] Progress Note Rachel [name] MRN: [id number] DOB: November 24, 1950 Age: 62 y.o.  Date: 06/14/2012 Start Time: 1:18 PM End Time: 1:53 PM  Chief Complaint: Chief Complaint  Patient presents with  . Depression  . Follow-up  . Other  . Medication Refill   Subjective: "I'm doing good". Depression 2/10 and Anxiety 2/10, where 1 is the best and 10 is the worst.  Pain is 2/10 also, all over her body.  History of presenting illness Patient came for her followup appointment with her husband.  Pt reports that she is compliant with the psychotropic medications with good benefit and no noticeable side effects.  She has been having some headaches.  Periactin for sinuses was covered by insurance and it is helping her be more alert.  The periactin did not have any effect with the libido.  Discussed either adding Yohombine for the libido or switching out the antidepressant for Wellbutrin.  We sort of came to a conclusion that adding Jhonnie Garner might be the easiest first step for her libido, then we could switch to Wellbutrin next time.   She asks for more assistance in getting to sleep at night.  Discussed the possibilities of increasing Trazodone vs Risperdal for that.   She notes memory problems.  Discussed substituting Neurontin for the Xanax.  Husband felt that she took a long time to find the meds that work good for her.  Current psychiatric medication Paxil 40 mg daily Trazodone 150 mg at bedtime Xanax 0.25 mg 3 times a day Risperdal 1 mg at bedtime Periactin 4 mg 1 a day helps sinuses, will have Dr Loleta Chance take over writing this.   Past psychiatric history Patient has been seeing in this office since 1998.  She was admitted at [facility] due to severe depression and psychotic features.  At that time she was having suicidal thoughts .  Since then she has been stable on current psychiatric medication. Allergies: Allergies  Allergen Reactions  . Sulfa  Antibiotics Anaphylaxis  Medical History: Past Medical History  Diagnosis Date  . Diabetes mellitus type I   . Hyperlipidemia   . Vertigo   . GERD (gastroesophageal reflux disease)   Patient has history of vertigo, GERD, hyperlipidemia, diabetes mellitus.  Patient is also legally blind.  She see Dr. Jorge Ny for her annual checkup and will follow up for elevated Hemo A1c with him. Surgical History: Past Surgical History  Procedure Laterality Date  . Brain tumor excision    Reviewed during this appointment. Vitals: BP 128/79  Pulse 76  Ht 5' 6.5" (1.689 m)  Wt 197 lb (89.359 kg)  BMI 31.32 kg/m2 Wt down almost 3 pounds.  Mental status examination Patient is casually dressed and fairly groomed. Patient is legally blind and need her husband`s support and assistance for walking. She described her mood is neutral and her affect is mood congruent. She denies any active or passive suicidal thinking and homicidal thinking. Her thought process is logical linear and goal-directed. There are no psychotic symptoms present. She denies any auditory or visual hallucination.  She has poverty of thought content.  She's alert and oriented x3 her insight judgment and impulse control is okay. There no tremors shakes or extrapyramidal side effects noted.  Lab Results:  Results for orders placed in visit on 04/01/12 (from the past 8736 hour(s))  HEMOGLOBIN A1C   Collection Time    04/01/12 11:05 AM      Result Value Range   Hemoglobin A1C  10.3 (*) <5.7 %   Mean Plasma Glucose 249 (*) <117 mg/dL   Assessment Axis I Maj. depressive disorder with psychotic features Axis II deferred Axis III see medical history Axis IV mild to moderate   Plan: I took her vitals.  I reviewed CC, tobacco/med/surg Hx, meds effects/ side effects, problem list, therapies and responses as well as current situation/symptoms discussed options. Find a way to get insurance ot cover Yohimbine.  Increase Trazodone for more  sedation.  Cont other meds, have Dr Loleta Chance cover the Periactin.  See orders and pt instructions for more details.  MEDICATIONS this encounter: Meds ordered this encounter  Medications  . traZODone (DESYREL) 100 MG tablet    Sig: Take 2 tablets (200 mg total) by mouth at bedtime.    Dispense:  180 tablet    Refill:  0    90 day supply  . risperiDONE (RISPERDAL) 1 MG tablet    Sig: Take 1 tablet (1 mg total) by mouth daily.    Dispense:  90 tablet    Refill:  0  . PARoxetine (PAXIL) 40 MG tablet    Sig: Take 1 tablet (40 mg total) by mouth daily at 8 pm.    Dispense:  90 tablet    Refill:  0  . cyproheptadine (PERIACTIN) 4 MG tablet    Sig: Take 1 tablet (4 mg total) by mouth as needed (for sinus headaches.).    Dispense:  90 tablet    Refill:  0  . ALPRAZolam (XANAX) 0.25 MG tablet    Sig: Take 1 tablet (0.25 mg total) by mouth 3 (three) times daily as needed for sleep.    Dispense:  270 tablet    Refill:  0    Medical Decision Making Problem Points:  Established problem, stable/improving (1), Established problem, worsening (2), Review of last therapy session (1) and Review of psycho-social stressors (1) Data Points:  Review or order medicine tests (1) Review of medication regiment & side effects (2) Review of new medications or change in dosage (2)  I certify that outpatient services furnished can reasonably be expected to improve the patient's condition.   Orson Aloe, MD, [facility]

## 2012-06-14 NOTE — Patient Instructions (Addendum)
Ask Dr Loleta Chance to consider writing for the Periactin for her allergies.  It didn't work so well for the libido.  Look up on line info about cloudy consciousness and forgetfulness with Xanax as well as the use of Neurontin for anxiety in place of Xanax.

## 2012-06-20 ENCOUNTER — Ambulatory Visit (HOSPITAL_COMMUNITY): Payer: Self-pay | Admitting: Psychiatry

## 2012-07-06 ENCOUNTER — Other Ambulatory Visit: Payer: Self-pay | Admitting: *Deleted

## 2012-07-06 MED ORDER — DILTIAZEM HCL ER COATED BEADS 180 MG PO CP24: 180.0000 mg | ORAL_CAPSULE | Freq: Every day | ORAL | Status: DC

## 2012-07-08 ENCOUNTER — Other Ambulatory Visit: Payer: Self-pay | Admitting: *Deleted

## 2012-07-08 MED ORDER — DILTIAZEM HCL ER COATED BEADS 180 MG PO CP24: 180.0000 mg | ORAL_CAPSULE | Freq: Every day | ORAL | Status: DC

## 2012-09-15 ENCOUNTER — Ambulatory Visit (HOSPITAL_COMMUNITY): Payer: BC Managed Care – PPO | Admitting: Psychiatry

## 2012-09-15 ENCOUNTER — Ambulatory Visit (HOSPITAL_COMMUNITY): Payer: Self-pay | Admitting: Psychiatry

## 2012-09-29 ENCOUNTER — Encounter: Payer: Self-pay | Admitting: *Deleted

## 2012-10-03 ENCOUNTER — Encounter: Payer: Self-pay | Admitting: Cardiovascular Disease

## 2012-10-03 ENCOUNTER — Ambulatory Visit (INDEPENDENT_AMBULATORY_CARE_PROVIDER_SITE_OTHER): Payer: BC Managed Care – PPO | Admitting: Psychiatry

## 2012-10-03 ENCOUNTER — Encounter (HOSPITAL_COMMUNITY): Payer: Self-pay | Admitting: Psychiatry

## 2012-10-03 VITALS — BP 110/80 | Ht 67.0 in | Wt 194.0 lb

## 2012-10-03 DIAGNOSIS — F329 Major depressive disorder, single episode, unspecified: Secondary | ICD-10-CM

## 2012-10-03 DIAGNOSIS — F5105 Insomnia due to other mental disorder: Secondary | ICD-10-CM

## 2012-10-03 DIAGNOSIS — F200 Paranoid schizophrenia: Secondary | ICD-10-CM

## 2012-10-03 DIAGNOSIS — F489 Nonpsychotic mental disorder, unspecified: Secondary | ICD-10-CM

## 2012-10-03 MED ORDER — RISPERIDONE 1 MG PO TABS: 1.0000 mg | ORAL_TABLET | Freq: Every day | ORAL | Status: DC

## 2012-10-03 MED ORDER — PAROXETINE HCL 40 MG PO TABS: 40.0000 mg | ORAL_TABLET | Freq: Every day | ORAL | Status: DC

## 2012-10-03 MED ORDER — TRAZODONE HCL 100 MG PO TABS: 100.0000 mg | ORAL_TABLET | Freq: Every day | ORAL | Status: DC

## 2012-10-03 MED ORDER — ALPRAZOLAM 0.25 MG PO TABS: 0.2500 mg | ORAL_TABLET | Freq: Three times a day (TID) | ORAL | Status: DC | PRN

## 2012-10-03 NOTE — Patient Instructions (Signed)
Stop meclizine

## 2012-10-03 NOTE — Progress Notes (Signed)
Patient ID: Rachel [name], female   DOB: 1950/12/06, 62 y.o.   MRN: [id number] [facility] [address] Progress Note Rachel [name] MRN: [id number] DOB: 07-02-1950 Age: 62 y.o.  Date: 10/03/2012 Start Time: 1:18 PM End Time: 1:53 PM  Chief Complaint: Chief Complaint  Patient presents with  . Schizophrenia  . Medication Refill   Subjective: "I'm doing okay." This patient is a 62 year old married black female lives with her husband in [place]. She has not worked in years but is not on disability. She is a very poor historian and seems dull and oversedated today.  Apparently she has had mental illness for many years. At one point she was depressed but also had psychotic symptoms that sound congruent with schizophrenia. Her husband thinks she is doing pretty well on her current medications but she tends to sleep too much during the day. We've looked at her med list and found that her trazodone might be too high. She does better if she only takes 100 mg instead of 200 mg. She also takes meclizine in the morning which may be contributing to drowsiness. Neither one of them remember why she is on less.  The patient denies being depressed but her energy is poor and she is drowsy. She doesn't sleep well she'll start to have visual hallucinations at night. This happens if she sleeps throughout the whole day. She denies auditory hallucinations or paranoia.  Current psychiatric medication Paxil 40 mg daily Trazodone 100 mg at bedtime Xanax 0.25 mg 3 times a day Risperdal 1 mg at bedtime    Past psychiatric history Patient has been seeing in this office since 1998.  She was admitted at [facility] due to severe depression and psychotic features.  At that time she was having suicidal thoughts .  Since then she has been stable on current psychiatric medication. Allergies: Allergies  Allergen Reactions  . Sulfa Antibiotics Anaphylaxis  . Ibuprofen   Medical  History: Past Medical History  Diagnosis Date  . Diabetes mellitus type I   . Hyperlipidemia   . Vertigo   . GERD (gastroesophageal reflux disease)   . Systemic hypertension   . Paroxysmal atrial fibrillation   . Schizophrenia   Patient has history of vertigo, GERD, hyperlipidemia, diabetes mellitus.  Patient is also legally blind.  She see Dr. Jorge Ny for her annual checkup and will follow up for elevated Hemo A1c with him. Surgical History: Past Surgical History  Procedure Laterality Date  . Brain tumor excision    . US echocardiography  12/20/2003    mild mitral annular ca+,mild MR,TR,PI,AOV mildly sclerotic  . Nm myocar perf wall motion  02/01/2007    no significant ischemia  Reviewed during this appointment. Vitals: BP 110/80  Ht 5\' 7"  (1.702 m)  Wt 194 lb (87.998 kg)  BMI 30.38 kg/m2 Wt down almost 3 pounds.  Mental status examination Patient is casually dressed and fairly groomed. Patient is legally blind and need her husband`s support and assistance for walking. She described her mood is neutral and her affect is mood congruent. Wearing dark glasses and is constantly chewing, and shifting in her seat. Her affect is blunted. Her speech is sparse and she answers in monosyllables She denies any active or passive suicidal thinking and homicidal thinking. Her thought process is  Difficult to assess because she says a little There are no psychotic symptoms present. She denies any auditory or visual hallucination.  She has poverty of thought content.  She's alert and  oriented x3 her insight judgment and impulse control is okay. There no tremors shakes or extrapyramidal side effects noted.  Lab Results:  Results for orders placed in visit on 04/01/12 (from the past 8736 hour(s))  HEMOGLOBIN A1C   Collection Time    04/01/12 11:05 AM      Result Value Range   Hemoglobin A1C 10.3 (*) <5.7 %   Mean Plasma Glucose 249 (*) <117 mg/dL   Assessment Axis I probable schizophrenia Axis  II deferred Axis III see medical history Axis IV mild to moderate  Axis V 60 Plan: I took her vitals.  I reviewed CC, tobacco/med/surg Hx, meds effects/ side effects, problem list, therapies and responses as well as current situation/symptoms discussed options. Discontinue meclizine, continue the trazodone at the current dose of 100 mg each bedtime and continue other meds. She'll return in 3 months.  See orders and pt instructions for more details.  MEDICATIONS this encounter: Meds ordered this encounter  Medications  . ALPRAZolam (XANAX) 0.25 MG tablet    Sig: Take 1 tablet (0.25 mg total) by mouth 3 (three) times daily as needed for sleep.    Dispense:  270 tablet    Refill:  0  . DISCONTD: PARoxetine (PAXIL) 40 MG tablet    Sig: Take 1 tablet (40 mg total) by mouth daily at 8 pm.    Dispense:  90 tablet    Refill:  0  . DISCONTD: risperiDONE (RISPERDAL) 1 MG tablet    Sig: Take 1 tablet (1 mg total) by mouth daily.    Dispense:  90 tablet    Refill:  0  . DISCONTD: traZODone (DESYREL) 100 MG tablet    Sig: Take 1 tablet (100 mg total) by mouth at bedtime.    Dispense:  90 tablet    Refill:  0    90 day supply  . DISCONTD: PARoxetine (PAXIL) 40 MG tablet    Sig: Take 1 tablet (40 mg total) by mouth daily at 8 pm.    Dispense:  90 tablet    Refill:  0  . DISCONTD: risperiDONE (RISPERDAL) 1 MG tablet    Sig: Take 1 tablet (1 mg total) by mouth daily.    Dispense:  90 tablet    Refill:  0  . DISCONTD: traZODone (DESYREL) 100 MG tablet    Sig: Take 1 tablet (100 mg total) by mouth at bedtime.    Dispense:  90 tablet    Refill:  0    90 day supply  . traZODone (DESYREL) 100 MG tablet    Sig: Take 1 tablet (100 mg total) by mouth at bedtime.    Dispense:  90 tablet    Refill:  0    90 day supply  . DISCONTD: risperiDONE (RISPERDAL) 1 MG tablet    Sig: Take 1 tablet (1 mg total) by mouth daily.    Dispense:  90 tablet    Refill:  0  . PARoxetine (PAXIL) 40 MG tablet     Sig: Take 1 tablet (40 mg total) by mouth daily at 8 pm.    Dispense:  90 tablet    Refill:  0  . risperiDONE (RISPERDAL) 1 MG tablet    Sig: Take 1 tablet (1 mg total) by mouth daily.    Dispense:  90 tablet    Refill:  0    Medical Decision Making Problem Points:  Established problem, stable/improving (1), Established problem, worsening (2), Review of last therapy session (1) and  Review of psycho-social stressors (1) Data Points:  Review or order medicine tests (1) Review of medication regiment & side effects (2) Review of new medications or change in dosage (2)  I certify that outpatient services furnished can reasonably be expected to improve the patient's condition.   Diannia Ruder, MD

## 2012-10-04 ENCOUNTER — Encounter: Payer: Self-pay | Admitting: Cardiovascular Disease

## 2012-10-04 ENCOUNTER — Ambulatory Visit (INDEPENDENT_AMBULATORY_CARE_PROVIDER_SITE_OTHER): Payer: BC Managed Care – PPO | Admitting: Cardiovascular Disease

## 2012-10-04 VITALS — BP 126/60 | HR 76 | Resp 16 | Ht 67.0 in | Wt 194.1 lb

## 2012-10-04 DIAGNOSIS — E785 Hyperlipidemia, unspecified: Secondary | ICD-10-CM

## 2012-10-04 DIAGNOSIS — Z794 Long term (current) use of insulin: Secondary | ICD-10-CM

## 2012-10-04 DIAGNOSIS — I480 Paroxysmal atrial fibrillation: Secondary | ICD-10-CM

## 2012-10-04 DIAGNOSIS — Z8669 Personal history of other diseases of the nervous system and sense organs: Secondary | ICD-10-CM

## 2012-10-04 DIAGNOSIS — Z87898 Personal history of other specified conditions: Secondary | ICD-10-CM

## 2012-10-04 DIAGNOSIS — E119 Type 2 diabetes mellitus without complications: Secondary | ICD-10-CM

## 2012-10-04 DIAGNOSIS — I10 Essential (primary) hypertension: Secondary | ICD-10-CM

## 2012-10-04 DIAGNOSIS — I4891 Unspecified atrial fibrillation: Secondary | ICD-10-CM

## 2012-10-04 NOTE — Patient Instructions (Addendum)
Your physician recommends that you schedule a follow-up appointment in: ONE YEAR 

## 2012-10-11 ENCOUNTER — Encounter: Payer: Self-pay | Admitting: Cardiovascular Disease

## 2012-10-11 DIAGNOSIS — E785 Hyperlipidemia, unspecified: Secondary | ICD-10-CM | POA: Insufficient documentation

## 2012-10-11 DIAGNOSIS — E119 Type 2 diabetes mellitus without complications: Secondary | ICD-10-CM | POA: Insufficient documentation

## 2012-10-11 DIAGNOSIS — Z87898 Personal history of other specified conditions: Secondary | ICD-10-CM | POA: Insufficient documentation

## 2012-10-11 DIAGNOSIS — I480 Paroxysmal atrial fibrillation: Secondary | ICD-10-CM | POA: Insufficient documentation

## 2012-10-11 DIAGNOSIS — I10 Essential (primary) hypertension: Secondary | ICD-10-CM | POA: Insufficient documentation

## 2012-10-11 NOTE — Assessment & Plan Note (Signed)
Excellent control.   

## 2012-10-11 NOTE — Progress Notes (Signed)
Patient ID: Rachel [name], female   DOB: 17-Jan-1951, 62 y.o.   MRN: [id number]      Reason for office visit Followup atrial fibrillation  Rachel [name] is a 62 year old woman with neurological sequelae following surgery and chemotherapy for a extensive brain tumor. She is diagnosed with schizophrenia. She has had paroxysmal atrial fibrillation and has systemic hypertension and insulin requiring diabetes mellitus. From a cardiovascular standpoint there have been no new events since her last appointment one year ago. A large part of the review of systems is obtained from her husband since she is not very communicative. She does not appear to be in any distress and confirms her husband's reports with shakes or nods of the head.   Allergies  Allergen Reactions  . Sulfa Antibiotics Anaphylaxis  . Ibuprofen     Current Outpatient Prescriptions  Medication Sig Dispense Refill  . ALPRAZolam (XANAX) 0.25 MG tablet Take 1 tablet (0.25 mg total) by mouth 3 (three) times daily as needed for sleep.  270 tablet  0  . CRESTOR 10 MG tablet       . digoxin (LANOXIN) 0.125 MG tablet       . diltiazem (CARDIZEM CD) 180 MG 24 hr capsule Take 1 capsule (180 mg total) by mouth daily.  90 capsule  6  . famotidine (PEPCID) 20 MG tablet 20 mg daily.       Marland Kitchen gabapentin (NEURONTIN) 300 MG capsule Take 300 mg by mouth daily.       Marland Kitchen KLOR-CON 10 10 MEQ tablet Take 10 mEq by mouth daily.       Marland Kitchen NOVOLIN N 100 UNIT/ML injection Inject 12 Units into the skin 2 (two) times daily at 8 am and 10 pm.       . PARoxetine (PAXIL) 40 MG tablet Take 1 tablet (40 mg total) by mouth daily at 8 pm.  90 tablet  0  . risperiDONE (RISPERDAL) 1 MG tablet Take 1 tablet (1 mg total) by mouth daily.  90 tablet  0  . traZODone (DESYREL) 100 MG tablet Take 1 tablet (100 mg total) by mouth at bedtime.  90 tablet  0  . valsartan-hydrochlorothiazide (DIOVAN-HCT) 160-12.5 MG per tablet       . VOLTAREN 1 % GEL        No current  facility-administered medications for this visit.    Past Medical History  Diagnosis Date  . Diabetes mellitus type I   . Hyperlipidemia   . Vertigo   . GERD (gastroesophageal reflux disease)   . Systemic hypertension   . Paroxysmal atrial fibrillation   . Schizophrenia     Past Surgical History  Procedure Laterality Date  . Brain tumor excision    . US echocardiography  12/20/2003    mild mitral annular ca+,mild MR,TR,PI,AOV mildly sclerotic  . Nm myocar perf wall motion  02/01/2007    no significant ischemia    Family History  Problem Relation Age of Onset  . ADD / ADHD Neg Hx   . Alcohol abuse Neg Hx   . Drug abuse Neg Hx   . Anxiety disorder Neg Hx   . Bipolar disorder Neg Hx   . Dementia Neg Hx   . Depression Neg Hx   . OCD Neg Hx   . Paranoid behavior Neg Hx   . Schizophrenia Neg Hx   . Seizures Neg Hx   . Sexual abuse Neg Hx   . Physical abuse Neg Hx     History  Social History  . Marital Status: Married    Spouse Name: N/A    Number of Children: N/A  . Years of Education: N/A   Occupational History  . Not on file.   Social History Main Topics  . Smoking status: Never Smoker   . Smokeless tobacco: Never Used  . Alcohol Use: No  . Drug Use: No  . Sexual Activity: Not on file   Other Topics Concern  . Not on file   Social History Narrative  . No narrative on file    Review of systems: The patient specifically denies any chest pain at rest or with exertion, dyspnea at rest or with exertion, orthopnea, paroxysmal nocturnal dyspnea, syncope, palpitations, focal neurological deficits, intermittent claudication, unexplained weight gain, cough, hemoptysis or wheezing. She has rare ankle swelling  The patient also denies abdominal pain, nausea, vomiting, dysphagia, diarrhea, constipation, polyuria, polydipsia, dysuria, hematuria, frequency, urgency, abnormal bleeding or bruising, fever, chills, unexpected weight changes,  change in skin or hair  texture, change in voice quality, auditory or visual problems, allergic reactions or rashes, new musculoskeletal complaints other than usual "aches and pains".   PHYSICAL EXAM BP 126/60  Pulse 76  Resp 16  Ht 5\' 7"  (1.702 m)  Wt 194 lb 1.6 oz (88.043 kg)  BMI 30.39 kg/m2  General: Alert, oriented x3, no distress Head: no evidence of trauma, PERRL, EOMI, no exophtalmos or lid lag, no myxedema, no xanthelasma; normal ears, nose and oropharynx Neck: normal jugular venous pulsations and no hepatojugular reflux; brisk carotid pulses without delay and no carotid bruits Chest: clear to auscultation, no signs of consolidation by percussion or palpation, normal fremitus, symmetrical and full respiratory excursions Cardiovascular: normal position and quality of the apical impulse, regular rhythm, normal first and second heart sounds, no murmurs, rubs or gallops Abdomen: no tenderness or distention, no masses by palpation, no abnormal pulsatility or arterial bruits, normal bowel sounds, no hepatosplenomegaly Extremities: no clubbing, cyanosis or edema; 2+ radial, ulnar and brachial pulses bilaterally; 2+ right femoral, posterior tibial and dorsalis pedis pulses; 2+ left femoral, posterior tibial and dorsalis pedis pulses; no subclavian or femoral bruits Neurological: grossly nonfocal   EKG: Sinus rhythm, tiny Q wave in lead aVF, deep Q wave in lead 3 but no Q waves in leads 2 T-wave inversion from leads V2 to V6. QT C4 134 ms. Anterior T wave abnormality has waxed and waned in intensity on EKGs dating back many years.  Note normal myocardial perfusion by nuclear stress testing 2008 and normal left ventricular systolic function.  Lipid Panel January 2014 total cholesterol 142, triglycerides 58, HDL 59, LDL 71  BMET January 2014 creatinine 0.8, normal liver function tests and electrolytes    ASSESSMENT AND PLAN Paroxysmal atrial fibrillation No clinically apparent events. Low CHADS2Vasc2 score.  Would not recommend warfarin or a novel anticoagulant. Aspirin would be sufficient for stroke prophylaxis at this point. As she gets older than her risk of embolic events increases, the risk-benefit will have to be carefully analyzed. I do not think we can rule out her to be compliant with anticoagulation therapy, but her husband does take excellent care of her and she has not had any injuries or bleeding problems that I am aware of.  Dyslipidemia Excellent recent lipid profile despite the presence of borderline diabetes  HTN (hypertension) Excellent control    Orders Placed This Encounter  Procedures  . EKG 12-Lead   No orders of the defined types were placed in this encounter.  Holli Humbles, MD, [place] [facility] [place] [phone] office [phone] pager

## 2012-10-11 NOTE — Assessment & Plan Note (Addendum)
No clinically apparent events. Relatively low CHADS2Vasc2 score. Risks of stroke and bleeding fairly evenly balanced. Would not recommend warfarin or a novel anticoagulant. Aspirin would be sufficient for stroke prophylaxis at this point. As she gets older and her risk of embolic events increases, the risk-benefit will have to be carefully analyzed. I do not think we can rule out her to be compliant with anticoagulation therapy, but her husband does take excellent care of her and she has not had any injuries or bleeding problems that I am aware of.

## 2012-10-11 NOTE — Assessment & Plan Note (Signed)
Excellent recent lipid profile despite the presence of borderline diabetes

## 2012-12-30 ENCOUNTER — Encounter (HOSPITAL_COMMUNITY): Payer: Self-pay | Admitting: Psychiatry

## 2012-12-30 ENCOUNTER — Ambulatory Visit (INDEPENDENT_AMBULATORY_CARE_PROVIDER_SITE_OTHER): Payer: BC Managed Care – PPO | Admitting: Psychiatry

## 2012-12-30 VITALS — BP 130/84 | Ht 67.0 in | Wt 192.0 lb

## 2012-12-30 DIAGNOSIS — F329 Major depressive disorder, single episode, unspecified: Secondary | ICD-10-CM

## 2012-12-30 DIAGNOSIS — F5105 Insomnia due to other mental disorder: Secondary | ICD-10-CM

## 2012-12-30 DIAGNOSIS — F489 Nonpsychotic mental disorder, unspecified: Secondary | ICD-10-CM

## 2012-12-30 MED ORDER — TRAZODONE HCL 100 MG PO TABS: 100.0000 mg | ORAL_TABLET | Freq: Every day | ORAL | Status: DC

## 2012-12-30 MED ORDER — RISPERIDONE 1 MG PO TABS: 1.0000 mg | ORAL_TABLET | Freq: Every day | ORAL | Status: DC

## 2012-12-30 MED ORDER — PAROXETINE HCL 40 MG PO TABS: 40.0000 mg | ORAL_TABLET | Freq: Every day | ORAL | Status: DC

## 2012-12-30 MED ORDER — ALPRAZOLAM 0.25 MG PO TABS: ORAL_TABLET | ORAL | Status: DC

## 2012-12-30 NOTE — Progress Notes (Signed)
Patient ID: Rachel [name], female   DOB: 06-15-1950, 62 y.o.   MRN: [id number] Patient ID: Rachel [name], female   DOB: [birth year], 62 y.o.   MRN: [id number] [name] [address] Progress Note Rachel [name] MRN: [id number] DOB: 1950/07/08 Age: 62 y.o.  Date: 12/30/2012 Start Time: 1:18 PM End Time: 1:53 PM  Chief Complaint: Chief Complaint  Patient presents with  . Anxiety  . Depression  . Schizophrenia  . Follow-up   Subjective: "I'm doing okay." This patient is a 62 year old married black female lives with her husband in [place]. She has not worked in years but is not on disability. She is a very poor historian and seems dull and oversedated today.  Apparently she has had mental illness for many years. At one point she was depressed but also had psychotic symptoms that sound congruent with schizophrenia. Her husband thinks she is doing pretty well on her current medications but she tends to sleep too much during the day. We've looked at her med list and found that her trazodone might be too high. She does better if she only takes 100 mg instead of 200 mg. She also takes meclizine in the morning which may be contributing to drowsiness. Neither one of them remember why she is on less.  The patient denies being depressed but her energy is poor and she is drowsy. She doesn't sleep well she'll start to have visual hallucinations at night. This happens if she sleeps throughout the whole day. She denies auditory hallucinations or paranoia.  The patient returns after 3 months. She is again here with her husband. She is more alert today than she was last time is no longer drowsy. She claims she's not sleeping well but the husband states that she stays up to one in the morning but then sleeps until noon. He works the night shift and she is uncomfortable being alone. She is again rather blunted and doesn't say much but she is more responsive than she was last time. She  denies depression or auditory or visual hallucinations. She has significant visual problems and I suggested they look into services for the blind. Her husband did not seem very responsive to this. They both deny that she has any side effects her medication such as stiffness jerking or twitching  Current psychiatric medication Paxil 40 mg daily Trazodone 100 mg at bedtime Xanax 0.25 mg 3 times a day Risperdal 1 mg at bedtime    Past psychiatric history Patient has been seeing in this office since 1998.  She was admitted at [facility] due to severe depression and psychotic features.  At that time she was having suicidal thoughts .  Since then she has been stable on current psychiatric medication. Allergies: Allergies  Allergen Reactions  . Sulfa Antibiotics Anaphylaxis  . Ibuprofen   Medical History: Past Medical History  Diagnosis Date  . Diabetes mellitus type I   . Hyperlipidemia   . Vertigo   . GERD (gastroesophageal reflux disease)   . Systemic hypertension   . Paroxysmal atrial fibrillation   . Schizophrenia   Patient has history of vertigo, GERD, hyperlipidemia, diabetes mellitus.  Patient is also legally blind.  She see Dr. Jorge Ny for her annual checkup and will follow up for elevated Hemo A1c with him. Surgical History: Past Surgical History  Procedure Laterality Date  . Brain tumor excision    . US echocardiography  12/20/2003    mild mitral annular ca+,mild MR,TR,PI,AOV mildly sclerotic  .  Nm myocar perf wall motion  02/01/2007    no significant ischemia  Reviewed during this appointment. Vitals: BP 130/84  Ht 5\' 7"  (1.702 m)  Wt 192 lb (87.091 kg)  BMI 30.06 kg/m2 Wt down almost 3 pounds.  Mental status examination Patient is casually dressed and fairly groomed. Patient is legally blind and need her husband`s  assistance for walking. She described her mood is neutral and her affect is mood congruent. She is constantly chewing, and shifting in her seat.  Her affect is blunted. Her speech is sparse and she answers in monosyllables She denies any active or passive suicidal thinking and homicidal thinking. Her thought process is  Difficult to assess because she says a little There are no psychotic symptoms present. She denies any auditory or visual hallucination.  She has poverty of thought content.  She's alert and oriented x3 her insight judgment and impulse control is okay. There no tremors shakes or extrapyramidal side effects noted.  Lab Results:  Results for orders placed in visit on 04/01/12 (from the past 8736 hour(s))  HEMOGLOBIN A1C   Collection Time    04/01/12 11:05 AM      Result Value Range   Hemoglobin A1C 10.3 (*) <5.7 %   Mean Plasma Glucose 249 (*) <117 mg/dL   Assessment Axis I probable schizophrenia Axis II deferred Axis III see medical history Axis IV mild to moderate  Axis V 60 Plan: I took her vitals.  I reviewed CC, tobacco/med/surg Hx, meds effects/ side effects, problem list, therapies and responses as well as current situation/symptoms discussed options. Discontinue meclizine, continue the trazodone at the current dose of 100 mg each bedtime and continue other meds. She'll return in 3 months.  See orders and pt instructions for more details.  MEDICATIONS this encounter: Meds ordered this encounter  Medications  . traZODone (DESYREL) 100 MG tablet    Sig: Take 1 tablet (100 mg total) by mouth at bedtime.    Dispense:  90 tablet    Refill:  1    90 day supply  . risperiDONE (RISPERDAL) 1 MG tablet    Sig: Take 1 tablet (1 mg total) by mouth daily.    Dispense:  90 tablet    Refill:  1  . PARoxetine (PAXIL) 40 MG tablet    Sig: Take 1 tablet (40 mg total) by mouth daily at 8 pm.    Dispense:  90 tablet    Refill:  1  . ALPRAZolam (XANAX) 0.25 MG tablet    Sig: Take three times a day    Dispense:  270 tablet    Refill:  0    Medical Decision Making Problem Points:  Established problem,  stable/improving (1), Established problem, worsening (2), Review of last therapy session (1) and Review of psycho-social stressors (1) Data Points:  Review or order medicine tests (1) Review of medication regiment & side effects (2) Review of new medications or change in dosage (2)  I certify that outpatient services furnished can reasonably be expected to improve the patient's condition.   Diannia Ruder, MD

## 2013-01-16 ENCOUNTER — Telehealth: Payer: Self-pay | Admitting: Cardiovascular Disease

## 2013-01-16 MED ORDER — ROSUVASTATIN CALCIUM 10 MG PO TABS: 10.0000 mg | ORAL_TABLET | Freq: Every day | ORAL | Status: DC

## 2013-01-16 NOTE — Telephone Encounter (Signed)
Please refill Crestor at [facility]. Please call patient when done. Thanks.

## 2013-01-16 NOTE — Telephone Encounter (Signed)
E-sent prescription. Notified patient

## 2013-02-04 ENCOUNTER — Other Ambulatory Visit: Payer: Self-pay | Admitting: Cardiovascular Disease

## 2013-02-06 NOTE — Telephone Encounter (Signed)
Rx was sent to pharmacy electronically. 

## 2013-03-02 ENCOUNTER — Ambulatory Visit (INDEPENDENT_AMBULATORY_CARE_PROVIDER_SITE_OTHER): Payer: BC Managed Care – PPO | Admitting: Psychiatry

## 2013-03-02 ENCOUNTER — Encounter (HOSPITAL_COMMUNITY): Payer: Self-pay | Admitting: Psychiatry

## 2013-03-02 VITALS — BP 120/82 | Ht 67.0 in | Wt 189.0 lb

## 2013-03-02 DIAGNOSIS — F489 Nonpsychotic mental disorder, unspecified: Secondary | ICD-10-CM

## 2013-03-02 DIAGNOSIS — F200 Paranoid schizophrenia: Secondary | ICD-10-CM

## 2013-03-02 DIAGNOSIS — F5105 Insomnia due to other mental disorder: Secondary | ICD-10-CM

## 2013-03-02 DIAGNOSIS — F329 Major depressive disorder, single episode, unspecified: Secondary | ICD-10-CM

## 2013-03-02 MED ORDER — PAROXETINE HCL 40 MG PO TABS: 40.0000 mg | ORAL_TABLET | Freq: Every day | ORAL | Status: DC

## 2013-03-02 MED ORDER — ALPRAZOLAM 0.25 MG PO TABS: ORAL_TABLET | ORAL | Status: DC

## 2013-03-02 MED ORDER — RISPERIDONE 1 MG PO TABS: 1.0000 mg | ORAL_TABLET | Freq: Two times a day (BID) | ORAL | Status: DC

## 2013-03-02 MED ORDER — TRAZODONE HCL 100 MG PO TABS: 100.0000 mg | ORAL_TABLET | Freq: Every day | ORAL | Status: DC

## 2013-03-02 NOTE — Progress Notes (Signed)
Patient ID: DANIELLA DEWBERRY, female   DOB: January 05, 1951, 63 y.o.   MRN: [id number] Patient ID: GABRIELE ZWILLING, female   DOB: 08-11-1950, 63 y.o.   MRN: [id number] Patient ID: ADAIA MATTHIES, female   DOB: [birth year], 63 y.o.   MRN: [id number] [facility] [address] Progress Note LINNAEA AHN MRN: [id number] DOB: [birth year] Age: 63 y.o.  Date: 03/02/2013 Start Time: 1:18 PM End Time: 1:53 PM  Chief Complaint: Chief Complaint  Patient presents with  . Anxiety  . Depression  . Hallucinations  . Follow-up   Subjective: "I'm doing okay." This patient is a 63 year old married black female lives with her husband in [place]. She has not worked in years but is not on disability. She is a very poor historian and seems dull and oversedated today.  Apparently she has had mental illness for many years. At one point she was depressed but also had psychotic symptoms that sound congruent with schizophrenia. Her husband thinks she is doing pretty well on her current medications but she tends to sleep too much during the day. We've looked at her med list and found that her trazodone might be too high. She does better if she only takes 100 mg instead of 200 mg. She also takes meclizine in the morning which may be contributing to drowsiness. Neither one of them remember why she is on less.  The patient denies being depressed but her energy is poor and she is drowsy. She doesn't sleep well she'll start to have visual hallucinations at night. This happens if she sleeps throughout the whole day. She denies auditory hallucinations or paranoia.  The patient returns after 3 months. She she still looks somewhat blank and apathetic but this may be somewhat due to her visual impairment. Her husband states that right after Christmas she had visual hallucinations of children. This went on for about a week. He claims he called me about this but there is no record of it in the chart. The symptoms  resolved. I wonder if she has visualization because she cannot actually see very well. She does not have any auditory hallucinations right now. Her mood is generally good but she sleeps whenever her husband is at work but gets up and does things when he is home. She denies being depressed or suicidal. She denies any twitching or jerking but she still walks stiffly. She does not appear drowsy today like she has in the past. I told them both that we could increase Risperdal for the times when she does have some symptoms therefore I will give her enough to take 2 a day for these time periods  Current psychiatric medication Paxil 40 mg daily Trazodone 100 mg at bedtime Xanax 0.25 mg 3 times a day Risperdal 1 mg at bedtime    Past psychiatric history Patient has been seeing in this office since 1998.  She was admitted at [facility] due to severe depression and psychotic features.  At that time she was having suicidal thoughts .  Since then she has been stable on current psychiatric medication. Allergies: Allergies  Allergen Reactions  . Sulfa Antibiotics Anaphylaxis  . Ibuprofen   Medical History: Past Medical History  Diagnosis Date  . Diabetes mellitus type I   . Hyperlipidemia   . Vertigo   . GERD (gastroesophageal reflux disease)   . Systemic hypertension   . Paroxysmal atrial fibrillation   . Schizophrenia   Patient has history of vertigo, GERD, hyperlipidemia, diabetes mellitus.  Patient is also legally blind.  She see Dr. Karren Burly for her annual checkup and will follow up for elevated Hemo A1c with him. Surgical History: Past Surgical History  Procedure Laterality Date  . Brain tumor excision    . US echocardiography  12/20/2003    mild mitral annular ca+,mild MR,TR,PI,AOV mildly sclerotic  . Nm myocar perf wall motion  02/01/2007    no significant ischemia  Reviewed during this appointment. Vitals: BP 120/82  Ht 5\' 7"  (1.702 m)  Wt 189 lb (85.73 kg)  BMI 29.59  kg/m2 Wt down almost 3 pounds.  Mental status examination Patient is casually dressed and fairly groomed. Patient is legally blind and need her husband`s  assistance for walking. She described her mood is neutral and her affect is mood congruent. She is calm and not at all fidgety today. Her affect is blunted. Her speech is sparse and she answers in monosyllables She denies any active or passive suicidal thinking and homicidal thinking. Her thought process is  Difficult to assess because she says so little There are no psychotic symptoms present. She denies any auditory or visual hallucination.  She has poverty of thought content.  She's alert and oriented x3 her insight judgment and impulse control is okay. There no tremors shakes or extrapyramidal side effects noted.  Lab Results:  Results for orders placed in visit on 04/01/12 (from the past 8736 hour(s))  HEMOGLOBIN A1C   Collection Time    04/01/12 11:05 AM      Result Value Range   Hemoglobin A1C 10.3 (*) <5.7 %   Mean Plasma Glucose 249 (*) <117 mg/dL   Assessment Axis I probable schizophrenia Axis II deferred Axis III see medical history Axis IV mild to moderate  Axis V 60 Plan: I took her vitals.  I reviewed CC, tobacco/med/surg Hx, meds effects/ side effects, problem list, therapies and responses as well as current situation/symptoms discussed options. Discontinue meclizine, continue the trazodone , Paxil and Xanax at current dosages. She can continue Risperdal 1 mg per day but has been given enough to take an extra pill per day for hallucinations. Her husband will call me immediately if the hallucinations resume She'll return in 3 months.  See orders and pt instructions for more details.  MEDICATIONS this encounter: Meds ordered this encounter  Medications  . traZODone (DESYREL) 100 MG tablet    Sig: Take 1 tablet (100 mg total) by mouth at bedtime.    Dispense:  90 tablet    Refill:  1    90 day supply  . PARoxetine  (PAXIL) 40 MG tablet    Sig: Take 1 tablet (40 mg total) by mouth daily at 8 pm.    Dispense:  90 tablet    Refill:  1  . risperiDONE (RISPERDAL) 1 MG tablet    Sig: Take 1 tablet (1 mg total) by mouth 2 (two) times daily.    Dispense:  180 tablet    Refill:  1  . ALPRAZolam (XANAX) 0.25 MG tablet    Sig: Take three times a day    Dispense:  270 tablet    Refill:  0    Medical Decision Making Problem Points:  Established problem, stable/improving (1), Established problem, worsening (2), Review of last therapy session (1) and Review of psycho-social stressors (1) Data Points:  Review or order medicine tests (1) Review of medication regiment & side effects (2) Review of new medications or change in dosage (2)  I certify that  outpatient services furnished can reasonably be expected to improve the patient's condition.   Levonne Spiller, MD

## 2013-03-24 ENCOUNTER — Telehealth (HOSPITAL_COMMUNITY): Payer: Self-pay | Admitting: *Deleted

## 2013-03-24 NOTE — Telephone Encounter (Signed)
done

## 2013-03-27 ENCOUNTER — Ambulatory Visit (HOSPITAL_COMMUNITY)
Admission: RE | Admit: 2013-03-27 | Discharge: 2013-03-27 | Disposition: A | Discharge status: Home / Self Care | Payer: BC Managed Care – PPO | Source: Ambulatory Visit | Attending: Family Medicine | Admitting: Family Medicine

## 2013-03-27 ENCOUNTER — Other Ambulatory Visit (HOSPITAL_COMMUNITY): Payer: Self-pay | Admitting: Family Medicine

## 2013-03-27 DIAGNOSIS — IMO0002 Reserved for concepts with insufficient information to code with codable children: Secondary | ICD-10-CM | POA: Insufficient documentation

## 2013-03-27 DIAGNOSIS — M545 Low back pain, unspecified: Secondary | ICD-10-CM | POA: Insufficient documentation

## 2013-03-27 DIAGNOSIS — M47817 Spondylosis without myelopathy or radiculopathy, lumbosacral region: Secondary | ICD-10-CM | POA: Insufficient documentation

## 2013-03-27 DIAGNOSIS — M51379 Other intervertebral disc degeneration, lumbosacral region without mention of lumbar back pain or lower extremity pain: Secondary | ICD-10-CM | POA: Insufficient documentation

## 2013-03-27 DIAGNOSIS — W19XXXA Unspecified fall, initial encounter: Secondary | ICD-10-CM | POA: Insufficient documentation

## 2013-03-27 DIAGNOSIS — M549 Dorsalgia, unspecified: Secondary | ICD-10-CM

## 2013-03-27 DIAGNOSIS — M5137 Other intervertebral disc degeneration, lumbosacral region: Secondary | ICD-10-CM | POA: Insufficient documentation

## 2013-03-27 DIAGNOSIS — M4120 Other idiopathic scoliosis, site unspecified: Secondary | ICD-10-CM | POA: Insufficient documentation

## 2013-04-03 ENCOUNTER — Ambulatory Visit (HOSPITAL_COMMUNITY): Payer: Self-pay | Admitting: Psychiatry

## 2013-04-29 ENCOUNTER — Other Ambulatory Visit (HOSPITAL_COMMUNITY): Payer: Self-pay | Admitting: Psychiatry

## 2013-05-29 ENCOUNTER — Ambulatory Visit (HOSPITAL_COMMUNITY): Payer: Self-pay | Admitting: Psychiatry

## 2013-05-31 ENCOUNTER — Encounter (HOSPITAL_COMMUNITY): Payer: Self-pay | Admitting: Psychiatry

## 2013-05-31 ENCOUNTER — Ambulatory Visit (INDEPENDENT_AMBULATORY_CARE_PROVIDER_SITE_OTHER): Payer: BC Managed Care – PPO | Admitting: Psychiatry

## 2013-05-31 VITALS — BP 140/100 | Ht 67.0 in | Wt 187.0 lb

## 2013-05-31 DIAGNOSIS — H543 Unqualified visual loss, both eyes: Secondary | ICD-10-CM | POA: Insufficient documentation

## 2013-05-31 DIAGNOSIS — F329 Major depressive disorder, single episode, unspecified: Secondary | ICD-10-CM

## 2013-05-31 DIAGNOSIS — F411 Generalized anxiety disorder: Secondary | ICD-10-CM

## 2013-05-31 DIAGNOSIS — F3289 Other specified depressive episodes: Secondary | ICD-10-CM

## 2013-05-31 DIAGNOSIS — F200 Paranoid schizophrenia: Secondary | ICD-10-CM

## 2013-05-31 MED ORDER — FLUOXETINE HCL 40 MG PO CAPS: 40.0000 mg | ORAL_CAPSULE | Freq: Every morning | ORAL | Status: DC

## 2013-05-31 NOTE — Progress Notes (Signed)
Patient ID: Rachel [name], female   DOB: 04/07/1950, 63 y.o.   MRN: [id number] Patient ID: Rachel [name], female   DOB: 1950/04/28, 63 y.o.   MRN: [id number] Patient ID: Rachel [name], female   DOB: 08-31-1950, 63 y.o.   MRN: [id number] Patient ID: Rachel [name], female   DOB: 1950/07/11, 63 y.o.   MRN: [id number] [facility] [address] Progress Note Rachel [name] MRN: [id number] DOB: 01-Jan-1951 Age: 63 y.o.  Date: 05/31/2013 Start Time: 1:18 PM End Time: 1:53 PM  Chief Complaint: Chief Complaint  Patient presents with  . Anxiety  . Depression  . Schizophrenia   Subjective: "He's going to divorce me." This patient is a 63 year old married black female lives with her husband in [place]. She has not worked in years but is not on disability. She is a very poor historian and seems dull and oversedated today.  Apparently she has had mental illness for many years. At one point she was depressed but also had psychotic symptoms that sound congruent with schizophrenia. Her husband thinks she is doing pretty well on her current medications but she tends to sleep too much during the day. We've looked at her med list and found that her trazodone might be too high. She does better if she only takes 100 mg instead of 200 mg. She also takes meclizine in the morning which may be contributing to drowsiness. Neither one of them remember why she is on less.  The patient denies being depressed but her energy is poor and she is drowsy. She doesn't sleep well she'll start to have visual hallucinations at night. This happens if she sleeps throughout the whole day. She denies auditory hallucinations or paranoia.  The patient returns after 3 months. She seems to have even more trouble knowing where things are in her room in her vision seems worse. She's not had a brain scan in quite a while and her visual loss as originally due to a brain tumor. I suggested to her husband that  he take her back to her primary doctor and address this. Her mood seems to be lethargic and she sleeps all the time. However her husband works a rotating shift and she sleeps when he sleeps but sometimes also during the rest of the day. I've told him that we probably need to change her antidepressant to something more activating like Prozac and also cut down the trazodone. She has the delusional thought that he's going to divorce her and nothing could be further from the truth. I've instructed him to continue the Risperdal 2 mg per day every day-she's only been doing this every other day  Current psychiatric medication Paxil 40 mg daily Trazodone 100 mg at bedtime Xanax 0.25 mg 3 times a day Risperdal 1 mg at bedtime 2 mg every other day    Past psychiatric history Patient has been seeing in this office since 1998.  She was admitted at [facility] due to severe depression and psychotic features.  At that time she was having suicidal thoughts .  Since then she has been stable on current psychiatric medication. Allergies: Allergies  Allergen Reactions  . Sulfa Antibiotics Anaphylaxis  . Ibuprofen   Medical History: Past Medical History  Diagnosis Date  . Diabetes mellitus type I   . Hyperlipidemia   . Vertigo   . GERD (gastroesophageal reflux disease)   . Systemic hypertension   . Paroxysmal atrial fibrillation   . Schizophrenia   Patient has  history of vertigo, GERD, hyperlipidemia, diabetes mellitus.  Patient is also legally blind.  She see Dr. Karren Burly for her annual checkup and will follow up for elevated Hemo A1c with him. Surgical History: Past Surgical History  Procedure Laterality Date  . Brain tumor excision    . US echocardiography  12/20/2003    mild mitral annular ca+,mild MR,TR,PI,AOV mildly sclerotic  . Nm myocar perf wall motion  02/01/2007    no significant ischemia  Reviewed during this appointment. Vitals: BP 140/100  Ht 5\' 7"  (1.702 m)  Wt 187 lb (84.823  kg)  BMI 29.28 kg/m2 Wt down almost 3 pounds.  Mental status examination Patient is casually dressed and fairly groomed. Patient is legally blind and need her husband`s  assistance for walking. She described her mood as sad l and her affect is mood congruent. She is calm and not at all fidgety today. Her affect is blunted. Her speech is sparse and she answers in monosyllables She denies any active or passive suicidal thinking and homicidal thinking. Her thought process is significant for delusional thinking that her husband might divorce her. She is very blunted . She denies any auditory or visual hallucination.  She has poverty of thought content.  She's alert and oriented x3 her insight judgment and impulse control is okay. There no tremors shakes or extrapyramidal side effects noted.  Lab Results:  No results found for this or any previous visit (from the past 8736 hour(s)). Assessment Axis I probable schizophrenia Axis II deferred Axis III see medical history Axis IV mild to moderate  Axis V 60 Plan: I took her vitals.  I reviewed CC, tobacco/med/surg Hx, meds effects/ side effects, problem list, therapies and responses as well as current situation/symptoms discussed options. Discontinue Paxil and substitute Prozac 40 mg every morning. Only uses Xanax as needed and decrease trazodone to 50 mg each bedtime. I've again suggested that the husband contact the services for the blind to get her more activities.. She can continue Risperdal 2 mg per day at bedtime Her husband will call me immediately if the hallucinations resume She'll return in 6 week See orders and pt instructions for more details.  MEDICATIONS this encounter: Meds ordered this encounter  Medications  . FLUoxetine (PROZAC) 40 MG capsule    Sig: Take 1 capsule (40 mg total) by mouth every morning.    Dispense:  90 capsule    Refill:  2    Medical Decision Making Problem Points:  Established problem, stable/improving (1),  Established problem, worsening (2), Review of last therapy session (1) and Review of psycho-social stressors (1) Data Points:  Review or order medicine tests (1) Review of medication regiment & side effects (2) Review of new medications or change in dosage (2)  I certify that outpatient services furnished can reasonably be expected to improve the patient's condition.   Levonne Spiller, MD

## 2013-05-31 NOTE — Patient Instructions (Signed)
Cut trazodone in half Take Prozac in the am, all other meds in the pm Contact [facility] in [place]

## 2013-07-17 ENCOUNTER — Ambulatory Visit (INDEPENDENT_AMBULATORY_CARE_PROVIDER_SITE_OTHER): Payer: BC Managed Care – PPO | Admitting: Psychiatry

## 2013-07-17 ENCOUNTER — Encounter (HOSPITAL_COMMUNITY): Payer: Self-pay | Admitting: Psychiatry

## 2013-07-17 VITALS — BP 140/100 | Ht 67.0 in | Wt 187.0 lb

## 2013-07-17 DIAGNOSIS — F5105 Insomnia due to other mental disorder: Secondary | ICD-10-CM

## 2013-07-17 DIAGNOSIS — F209 Schizophrenia, unspecified: Secondary | ICD-10-CM

## 2013-07-17 DIAGNOSIS — F329 Major depressive disorder, single episode, unspecified: Secondary | ICD-10-CM

## 2013-07-17 DIAGNOSIS — F200 Paranoid schizophrenia: Secondary | ICD-10-CM

## 2013-07-17 MED ORDER — ALPRAZOLAM 0.25 MG PO TABS: ORAL_TABLET | ORAL | Status: DC

## 2013-07-17 NOTE — Progress Notes (Signed)
Patient ID: Rachel [name], female   DOB: [birth year], 63 y.o.   MRN: [id number] Patient ID: Rachel [name], female   DOB: 1950-08-17, 63 y.o.   MRN: [id number] Patient ID: Rachel [name], female   DOB: 12/27/1950, 63 y.o.   MRN: [id number] Patient ID: Rachel [name], female   DOB: 1950-12-30, 63 y.o.   MRN: [id number] Patient ID: Rachel [name], female   DOB: [birth year], 63 y.o.   MRN: [id number] [facility] [id number] Progress Note Rachel [name] MRN: [id number] DOB: 16-May-1950 Age: 63 y.o.  Date: 07/17/2013 Start Time: 1:18 PM End Time: 1:53 PM  Chief Complaint: Chief Complaint  Patient presents with  . Anxiety  . Depression  . Hallucinations  . Follow-up   Subjective: "She has more energy" This patient is a 63 year old married black female lives with her husband in [place]. She has not worked in years but is not on disability. She is a very poor historian and seems dull and oversedated today.  Apparently she has had mental illness for many years. At one point she was depressed but also had psychotic symptoms that sound congruent with schizophrenia. Her husband thinks she is doing pretty well on her current medications but she tends to sleep too much during the day. We've looked at her med list and found that her trazodone might be too high. She does better if she only takes 100 mg instead of 200 mg. She also takes meclizine in the morning which may be contributing to drowsiness. Neither one of them remember why she is on lthis.  The patient denies being depressed but her energy is poor and she is drowsy. She doesn't sleep well she'll start to have visual hallucinations at night. This happens if she sleeps throughout the whole day. She denies auditory hallucinations or paranoia.  The patient returns after 6 weeks. Last time she seems sluggish and was not sleeping at night and sleeping through the day. I changed her Paxil to Prozac cut down her  trazodone and suggested she take Risperdal 2 mg each bedtime. She is doing better now although her vision seems to be worse. Her husband claims she is seeing her primary doctor, Dr. Berdine Addison but nothing more is been done about it. She still harbors a delusion that her husband is going to leave her but it's not based on anything in reality. In discussing with her she seems to acknowledge this but then goes back to the fixed delusion. Overall however her husband thinks she is functioning better in terms of energy and sleep  Current psychiatric medication Prozac 40 mg daily Trazodone 50 mg at bedtime Xanax 0.25 mg 3 times a day Risperdal 2 mg at bedtime     Past psychiatric history Patient has been seeing in this office since 1998.  She was admitted at [facility] due to severe depression and psychotic features.  At that time she was having suicidal thoughts .  Since then she has been stable on current psychiatric medication. Allergies: Allergies  Allergen Reactions  . Sulfa Antibiotics Anaphylaxis  . Ibuprofen   Medical History: Past Medical History  Diagnosis Date  . Diabetes mellitus type I   . Hyperlipidemia   . Vertigo   . GERD (gastroesophageal reflux disease)   . Systemic hypertension   . Paroxysmal atrial fibrillation   . Schizophrenia   Patient has history of vertigo, GERD, hyperlipidemia, diabetes mellitus.  Patient is also legally blind.  She see Dr. Karren Burly for her  annual checkup and will follow up for elevated Hemo A1c with him. Surgical History: Past Surgical History  Procedure Laterality Date  . Brain tumor excision    . US echocardiography  12/20/2003    mild mitral annular ca+,mild MR,TR,PI,AOV mildly sclerotic  . Nm myocar perf wall motion  02/01/2007    no significant ischemia  Reviewed during this appointment. Vitals: BP 140/100  Ht 5\' 7"  (1.702 m)  Wt 187 lb (84.823 kg)  BMI 29.28 kg/m2 Wt down almost 3 pounds.  Mental status examination Patient is  casually dressed and fairly groomed. Patient is legally blind and need her husband`s  assistance for walking. She described her mood as fairly good.  She is calm and not at all fidgety today. Her affect is blunted. Her speech is sparse and she answers in monosyllables She denies any active or passive suicidal thinking and homicidal thinking. Her thought process is significant for delusional thinking that her husband might divorce her. She is very blunted . She denies any auditory or visual hallucination.  She has poverty of thought content.  She's alert and oriented x3 her insight judgment and impulse control is okay. There no tremors shakes or extrapyramidal side effects noted.  Lab Results:  No results found for this or any previous visit (from the past 8736 hour(s)). Assessment Axis Ischizophrenia Axis II deferred Axis III see medical history Axis IV mild to moderate  Axis V 60 Plan: I took her vitals.  I reviewed CC, tobacco/med/surg Hx, meds effects/ side effects, problem list, therapies and responses as well as current situation/symptoms discussed options. Continue Prozac 40 mg every morning. Only uses Xanax as needed and decrease trazodone to 50 mg each bedtime. I've again suggested that the husband contact the services for the blind to get her more activities.. She can continue Risperdal 2 mg per day at bedtime Her husband will call me immediately if the hallucinations resume She'll return in 3 months See orders and pt instructions for more details.  MEDICATIONS this encounter: Meds ordered this encounter  Medications  . ALPRAZolam (XANAX) 0.25 MG tablet    Sig: Take three times a day    Dispense:  270 tablet    Refill:  1    Medical Decision Making Problem Points:  Established problem, stable/improving (1), Established problem, worsening (2), Review of last therapy session (1) and Review of psycho-social stressors (1) Data Points:  Review or order medicine tests (1) Review of  medication regiment & side effects (2) Review of new medications or change in dosage (2)  I certify that outpatient services furnished can reasonably be expected to improve the patient's condition.   Levonne Spiller, MD

## 2013-08-25 ENCOUNTER — Ambulatory Visit (INDEPENDENT_AMBULATORY_CARE_PROVIDER_SITE_OTHER): Payer: BC Managed Care – PPO | Admitting: Psychiatry

## 2013-08-25 ENCOUNTER — Encounter (HOSPITAL_COMMUNITY): Payer: Self-pay | Admitting: Psychiatry

## 2013-08-25 VITALS — BP 140/98 | Ht 67.0 in | Wt 187.0 lb

## 2013-08-25 DIAGNOSIS — F200 Paranoid schizophrenia: Secondary | ICD-10-CM

## 2013-08-25 MED ORDER — PAROXETINE HCL 40 MG PO TABS: 40.0000 mg | ORAL_TABLET | Freq: Every day | ORAL | Status: DC

## 2013-08-25 MED ORDER — TRAZODONE HCL 150 MG PO TABS: 150.0000 mg | ORAL_TABLET | Freq: Every day | ORAL | Status: DC

## 2013-08-25 NOTE — Progress Notes (Signed)
Patient ID: Rachel [name], female   DOB: 21-Apr-1950, 63 y.o.   MRN: [id number] Patient ID: Rachel [name], female   DOB: 11-16-1950, 63 y.o.   MRN: [id number] Patient ID: Rachel [name], female   DOB: Jun 11, 1950, 63 y.o.   MRN: [id number] Patient ID: Rachel [name], female   DOB: 03-Sep-1950, 63 y.o.   MRN: [id number] Patient ID: Rachel [name], female   DOB: Dec 16, 1950, 63 y.o.   MRN: [id number] Patient ID: Rachel [name], female   DOB: [birth year], 63 y.o.   MRN: [id number] [facility] [address] Progress Note Rachel [name] MRN: [id number] DOB: [birth year] Age: 63 y.o.  Date: 08/25/2013 Start Time: 1:18 PM End Time: 1:53 PM  Chief Complaint: Chief Complaint  Patient presents with  . Schizophrenia  . Follow-up   Subjective: "She is not doing well This patient is a 63 year old married black female lives with her husband in [place]. She has not worked in years but is not on disability. She is a very poor historian and seems dull and oversedated today.  Apparently she has had mental illness for many years. At one point she was depressed but also had psychotic symptoms that sound congruent with schizophrenia. Her husband thinks she is doing pretty well on her current medications but she tends to sleep too much during the day. We've looked at her med list and found that her trazodone might be too high. She does better if she only takes 100 mg instead of 200 mg. She also takes meclizine in the morning which may be contributing to drowsiness. Neither one of them remember why she is on lthis.  The patient denies being depressed but her energy is poor and she is drowsy. She doesn't sleep well she'll start to have visual hallucinations at night. This happens if she sleeps throughout the whole day. She denies auditory hallucinations or paranoia.  The patient returns after four-weeks. She was seen as a work in today because her husband called and said she's not  doing well. She is to sleep a lot during the day so moved off her Risperdal to bedtime decreased her trazodone and changed her Paxil to Prozac. These changes are not done her well. She is more paranoid. She's convinced she heard some people in a restaurant talking about her picture hanging in a church. She is not sleeping at all at night on the lower dose of trazodone. She denies auditory or visual hallucinations  Current psychiatric medication Prozac 40 mg daily Trazodone 50 mg at bedtime Xanax 0.25 mg 3 times a day Risperdal 2 mg at bedtime     Past psychiatric history Patient has been seeing in this office since 1998.  She was admitted at [facility] due to severe depression and psychotic features.  At that time she was having suicidal thoughts .  Since then she has been stable on current psychiatric medication. Allergies: Allergies  Allergen Reactions  . Sulfa Antibiotics Anaphylaxis  . Ibuprofen   Medical History: Past Medical History  Diagnosis Date  . Diabetes mellitus type I   . Hyperlipidemia   . Vertigo   . GERD (gastroesophageal reflux disease)   . Systemic hypertension   . Paroxysmal atrial fibrillation   . Schizophrenia   Patient has history of vertigo, GERD, hyperlipidemia, diabetes mellitus.  Patient is also legally blind.  She see Dr. Karren Burly for her annual checkup and will follow up for elevated Hemo A1c with him. Surgical History: Past Surgical History  Procedure Laterality Date  . Brain tumor excision    . US echocardiography  12/20/2003    mild mitral annular ca+,mild MR,TR,PI,AOV mildly sclerotic  . Nm myocar perf wall motion  02/01/2007    no significant ischemia  Reviewed during this appointment. Vitals: BP 140/98  Ht 5\' 7"  (1.702 m)  Wt 187 lb (84.823 kg)  BMI 29.28 kg/m2 Wt down almost 3 pounds.  Mental status examination Patient is casually dressed and fairly groomed. Patient is legally blind and need her husband`s  assistance for walking.  She described her mood as ok.  She is calm and not at all fidgety today. Her affect is blunted. Her speech is sparse and she answers in monosyllables She denies any active or passive suicidal thinking and homicidal thinking. Her thought process is significant for delusional thinking . She is very blunted . She denies any auditory or visual hallucination.  She has poverty of thought content.  She's alert and oriented x3 her insight judgment and impulse control is okay. There no tremors shakes or extrapyramidal side effects noted.  Lab Results:  No results found for this or any previous visit (from the past 8736 hour(s)). Assessment Axis Ischizophrenia Axis II deferred Axis III see medical history Axis IV mild to moderate  Axis V 60 Plan: I took her vitals.  I reviewed CC, tobacco/med/surg Hx, meds effects/ side effects, problem list, therapies and responses as well as current situation/symptoms discussed options. She'll discontinue Prozac and will back to Paxil at 40 mg each bedtime. She'll restart trazodone at 150 mg each bedtime and continue Risperdal 2 mg each bedtime. She'll continue Xanax as needed and return to see me in four-week's. Her husband will call me if her situation worsens See orders and pt instructions for more details.  MEDICATIONS this encounter: Meds ordered this encounter  Medications  . traZODone (DESYREL) 150 MG tablet    Sig: Take 1 tablet (150 mg total) by mouth at bedtime.    Dispense:  90 tablet    Refill:  1  . PARoxetine (PAXIL) 40 MG tablet    Sig: Take 1 tablet (40 mg total) by mouth daily.    Dispense:  90 tablet    Refill:  2    Medical Decision Making Problem Points:  Established problem, stable/improving (1), Established problem, worsening (2), Review of last therapy session (1) and Review of psycho-social stressors (1) Data Points:  Review or order medicine tests (1) Review of medication regiment & side effects (2) Review of new medications or  change in dosage (2)  I certify that outpatient services furnished can reasonably be expected to improve the patient's condition.   Levonne Spiller, MD

## 2013-08-28 ENCOUNTER — Telehealth (HOSPITAL_COMMUNITY): Payer: Self-pay | Admitting: *Deleted

## 2013-08-29 NOTE — Telephone Encounter (Signed)
done

## 2013-09-11 ENCOUNTER — Encounter: Payer: Self-pay | Admitting: Cardiovascular Disease

## 2013-09-11 ENCOUNTER — Ambulatory Visit (INDEPENDENT_AMBULATORY_CARE_PROVIDER_SITE_OTHER): Payer: BC Managed Care – PPO | Admitting: Cardiovascular Disease

## 2013-09-11 VITALS — BP 128/88 | HR 61 | Resp 16 | Ht 68.0 in | Wt 188.6 lb

## 2013-09-11 DIAGNOSIS — E782 Mixed hyperlipidemia: Secondary | ICD-10-CM

## 2013-09-11 DIAGNOSIS — Z79899 Other long term (current) drug therapy: Secondary | ICD-10-CM

## 2013-09-11 DIAGNOSIS — I4891 Unspecified atrial fibrillation: Secondary | ICD-10-CM

## 2013-09-11 DIAGNOSIS — I480 Paroxysmal atrial fibrillation: Secondary | ICD-10-CM

## 2013-09-11 NOTE — Progress Notes (Signed)
Patient ID: Rachel [name], female   DOB: [birth year], 63 y.o.   MRN: [id number]     Reason for office visit Hypertension, hyperlipidemia, history of paroxysmal atrial fibrillation  Rachel [name] is a 63 year old woman with neurological sequelae following surgery and chemotherapy for a extensive brain tumor. She is diagnosed with schizophrenia. She has had paroxysmal atrial fibrillation and has systemic hypertension and insulin requiring diabetes mellitus. From a cardiovascular standpoint there have been no new events since her last appointment one year ago. As always, a large part of the review of systems is obtained from her husband since she is not very communicative. She does not appear to be in any distress. Today she actually spoke more than usual. She does not endorse any complaints   Allergies  Allergen Reactions  . Sulfa Antibiotics Anaphylaxis  . Ibuprofen     Current Outpatient Prescriptions  Medication Sig Dispense Refill  . ALPRAZolam (XANAX) 0.25 MG tablet Take three times a day  270 tablet  1  . digoxin (LANOXIN) 0.125 MG tablet TAKE 1 TABLET DAILY  90 tablet  2  . diltiazem (CARDIZEM CD) 180 MG 24 hr capsule Take 1 capsule (180 mg total) by mouth daily.  90 capsule  6  . famotidine (PEPCID) 20 MG tablet 20 mg daily.       Marland Kitchen gabapentin (NEURONTIN) 300 MG capsule Take 300 mg by mouth daily.       Marland Kitchen KLOR-CON 10 10 MEQ tablet Take 10 mEq by mouth daily.       . meclizine (ANTIVERT) 25 MG tablet Take 25 mg by mouth daily.      Marland Kitchen NOVOLIN N 100 UNIT/ML injection Inject 12 Units into the skin 2 (two) times daily at 8 am and 10 pm.       . PARoxetine (PAXIL) 40 MG tablet Take 1 tablet (40 mg total) by mouth daily.  90 tablet  2  . risperiDONE (RISPERDAL) 1 MG tablet Take 1 tablet (1 mg total) by mouth 2 (two) times daily.  180 tablet  1  . rosuvastatin (CRESTOR) 10 MG tablet Take 1 tablet (10 mg total) by mouth daily.  90 tablet  3  . traZODone (DESYREL) 150 MG tablet Take 1 tablet  (150 mg total) by mouth at bedtime.  90 tablet  1   No current facility-administered medications for this visit.    Past Medical History  Diagnosis Date  . Diabetes mellitus type I   . Hyperlipidemia   . Vertigo   . GERD (gastroesophageal reflux disease)   . Systemic hypertension   . Paroxysmal atrial fibrillation   . Schizophrenia     Past Surgical History  Procedure Laterality Date  . Brain tumor excision    . US echocardiography  12/20/2003    mild mitral annular ca+,mild MR,TR,PI,AOV mildly sclerotic  . Nm myocar perf wall motion  02/01/2007    no significant ischemia    Family History  Problem Relation Age of Onset  . ADD / ADHD Neg Hx   . Alcohol abuse Neg Hx   . Drug abuse Neg Hx   . Anxiety disorder Neg Hx   . Bipolar disorder Neg Hx   . Dementia Neg Hx   . Depression Neg Hx   . OCD Neg Hx   . Paranoid behavior Neg Hx   . Schizophrenia Neg Hx   . Seizures Neg Hx   . Sexual abuse Neg Hx   . Physical abuse Neg Hx  History   Social History  . Marital Status: Married    Spouse Name: N/A    Number of Children: N/A  . Years of Education: N/A   Occupational History  . Not on file.   Social History Main Topics  . Smoking status: Never Smoker   . Smokeless tobacco: Never Used  . Alcohol Use: No  . Drug Use: No  . Sexual Activity: Not on file   Other Topics Concern  . Not on file   Social History Narrative  . No narrative on file    Review of systems: The patient specifically denies any chest pain at rest or with exertion, dyspnea at rest or with exertion, orthopnea, paroxysmal nocturnal dyspnea, syncope, palpitations, focal neurological deficits, intermittent claudication, lower extremity edema, unexplained weight gain, cough, hemoptysis or wheezing.  The patient also denies abdominal pain, nausea, vomiting, dysphagia, diarrhea, constipation, polyuria, polydipsia, dysuria, hematuria, frequency, urgency, abnormal bleeding or bruising, fever,  chills, unexpected weight changes, mood swings, change in skin or hair texture, change in voice quality, auditory or visual problems, allergic reactions or rashes, new musculoskeletal complaints other than usual "aches and pains".   PHYSICAL EXAM BP 128/88  Pulse 61  Resp 16  Ht 5\' 8"  (1.727 m)  Wt 188 lb 9.6 oz (85.548 kg)  BMI 28.68 kg/m2  General: Alert, oriented x3, no distress Head: no evidence of trauma, PERRL, EOMI, no exophtalmos or lid lag, no myxedema, no xanthelasma; normal ears, nose and oropharynx Neck: normal jugular venous pulsations and no hepatojugular reflux; brisk carotid pulses without delay and no carotid bruits Chest: clear to auscultation, no signs of consolidation by percussion or palpation, normal fremitus, symmetrical and full respiratory excursions Cardiovascular: normal position and quality of the apical impulse, regular rhythm, normal first and second heart sounds, no murmurs, rubs or gallops Abdomen: no tenderness or distention, no masses by palpation, no abnormal pulsatility or arterial bruits, normal bowel sounds, no hepatosplenomegaly Extremities: no clubbing, cyanosis or edema; 2+ radial, ulnar and brachial pulses bilaterally; 2+ right femoral, posterior tibial and dorsalis pedis pulses; 2+ left femoral, posterior tibial and dorsalis pedis pulses; no subclavian or femoral bruits Neurological: grossly nonfocal   EKG: Sinus rhythm, normal QT interval  Lipid Panel  January 2014 total cholesterol 142, triglycerides 58, HDL 59, LDL 71  BMET  January 2014 creatinine 0.8, normal liver function tests and electrolytes   ASSESSMENT AND PLAN Paroxysmal atrial fibrillation  No clinically apparent events. Atrial fibrillation has not been documented in many years. Relatively Low CHADS2Vasc2 score. Would not recommend warfarin or a novel anticoagulant. Aspirin would be sufficient for stroke prophylaxis at this point. As she gets olderand her risk of embolic events  increases, the risk-benefit will have to be carefully analyzed. I do not think we can rely on her to be compliant with anticoagulation therapy, but her husband does take excellent care of her and she has not had any injuries or bleeding problems that I am aware of.  Dyslipidemia  Excellent recent lipid profile despite the presence of borderline diabetes . Time to recheck HTN (hypertension)  Excellent control   Orders Placed This Encounter  Procedures  . Lipid panel  . Comprehensive metabolic panel  . EKG 12-Lead   Meds ordered this encounter  Medications  . meclizine (ANTIVERT) 25 MG tablet    Sig: Take 25 mg by mouth daily.    Holli Humbles, MD, [place] [phone] office [phone] pager

## 2013-09-11 NOTE — Patient Instructions (Signed)
Your physician recommends that you return for lab work in: FASTING at [facility].  No appointment is required.  Dr. Sallyanne Kuster recommends that you schedule a follow-up appointment in: One year.

## 2013-09-12 ENCOUNTER — Telehealth: Payer: Self-pay | Admitting: *Deleted

## 2013-09-12 LAB — COMPREHENSIVE METABOLIC PANEL
ALK PHOS: 63 U/L (ref 39–117)
ALT: 8 U/L (ref 0–35)
AST: 13 U/L (ref 0–37)
Albumin: 3.9 g/dL (ref 3.5–5.2)
BILIRUBIN TOTAL: 0.5 mg/dL (ref 0.2–1.2)
BUN: 7 mg/dL (ref 6–23)
CO2: 29 mEq/L (ref 19–32)
CREATININE: 0.81 mg/dL (ref 0.50–1.10)
Calcium: 9.9 mg/dL (ref 8.4–10.5)
Chloride: 102 mEq/L (ref 96–112)
Glucose, Bld: 123 mg/dL — ABNORMAL HIGH (ref 70–99)
Potassium: 4 mEq/L (ref 3.5–5.3)
SODIUM: 137 meq/L (ref 135–145)
TOTAL PROTEIN: 6.7 g/dL (ref 6.0–8.3)

## 2013-09-12 LAB — LIPID PANEL
CHOL/HDL RATIO: 2.2 ratio
Cholesterol: 127 mg/dL (ref 0–200)
HDL: 59 mg/dL (ref >39)
LDL CALC: 59 mg/dL (ref 0–99)
Triglycerides: 45 mg/dL (ref <150)
VLDL: 9 mg/dL (ref 0–40)

## 2013-09-12 NOTE — Telephone Encounter (Signed)
Message copied by Tressa Busman on Tue Sep 12, 2013 12:50 PM ------      Message from: Sanda Klein      Created: Tue Sep 12, 2013  8:55 AM       Labs are ok, except for elevated glucose. Lipids are great ------

## 2013-09-12 NOTE — Telephone Encounter (Signed)
Lab results called to patient.  Voiced understanding.

## 2013-09-25 ENCOUNTER — Other Ambulatory Visit: Payer: Self-pay | Admitting: Cardiovascular Disease

## 2013-09-26 ENCOUNTER — Ambulatory Visit (INDEPENDENT_AMBULATORY_CARE_PROVIDER_SITE_OTHER): Payer: BC Managed Care – PPO | Admitting: Psychiatry

## 2013-09-26 ENCOUNTER — Encounter (HOSPITAL_COMMUNITY): Payer: Self-pay | Admitting: Psychiatry

## 2013-09-26 VITALS — BP 137/80 | HR 83 | Ht 68.0 in | Wt 187.4 lb

## 2013-09-26 DIAGNOSIS — F200 Paranoid schizophrenia: Secondary | ICD-10-CM

## 2013-09-26 MED ORDER — RISPERIDONE 2 MG PO TABS: 2.0000 mg | ORAL_TABLET | Freq: Every day | ORAL | Status: DC

## 2013-09-26 MED ORDER — ALPRAZOLAM 0.25 MG PO TABS: 0.2500 mg | ORAL_TABLET | Freq: Three times a day (TID) | ORAL | Status: DC

## 2013-09-26 MED ORDER — TRAZODONE HCL 150 MG PO TABS: 75.0000 mg | ORAL_TABLET | Freq: Every day | ORAL | Status: DC

## 2013-09-26 NOTE — Telephone Encounter (Signed)
Rx was sent to pharmacy electronically. 

## 2013-09-26 NOTE — Progress Notes (Signed)
Patient ID: Rachel [name], female   DOB: [birth year], 63 y.o.   MRN: [id number] Patient ID: Rachel [name], female   DOB: 10-Jul-1950, 63 y.o.   MRN: [id number] Patient ID: Rachel [name], female   DOB: 05-05-1950, 63 y.o.   MRN: [id number] Patient ID: Rachel [name], female   DOB: 08/19/1950, 63 y.o.   MRN: [id number] Patient ID: Rachel [name], female   DOB: [birth year], 63 y.o.   MRN: [id number] Patient ID: Rachel [name], female   DOB: [birth year], 63 y.o.   MRN: [id number] Patient ID: Rachel [name], female   DOB: [birth year], 63 y.o.   MRN: [id number] [facility] [id number] Progress Note Rachel [name] MRN: [id number] DOB: 01-31-1951 Age: 63 y.o.  Date: 09/26/2013 Start Time: 1:18 PM End Time: 1:53 PM  Chief Complaint: Chief Complaint  Patient presents with  . Schizophrenia  . Follow-up   Subjective: "She is doing better This patient is a 63 year old married black female lives with her husband in [place]. She has not worked in years but is not on disability. She is a very poor historian   Apparently she has had mental illness for many years. At one point she was depressed but also had psychotic symptoms that sound congruent with schizophrenia. Her husband thinks she is doing pretty well on her current medications but she tends to sleep too much during the day. We've looked at her med list and found that her trazodone might be too high. She does better if she only takes 100 mg instead of 200 mg. She also takes meclizine in the morning which may be contributing to drowsiness. Neither one of them remember why she is on lthis.  The patient denies being depressed but her energy is poor and she is drowsy. She doesn't sleep well she'll start to have visual hallucinations at night. This happens if she sleeps throughout the whole day. She denies auditory hallucinations or paranoia.  The patient returns after four-weeks. She is here with her husband.  Last time she was not doing well and was more paranoid and felt that people were talking about her. I increased her Risperdal to 2 mg and also increased her trazodone and went back to Paxil. She's no longer is paranoid that harbors a fixed delusion that her husband is going to divorce her. They have been married for 40 years and is never given any indication of this but she will not listen. She still has variable sleep because she often sleeps during the day and wants to stay up at night. I encouraged her to set a regular bedtime between 10 and 11 PM.  Current psychiatric medication paxil 40 mg daily Trazodone 75 mg at bedtime Xanax 0.25 mg 3 times a day Risperdal 2 mg at bedtime     Past psychiatric history Patient has been seeing in this office since 1998.  She was admitted at [facility] due to severe depression and psychotic features.  At that time she was having suicidal thoughts .  Since then she has been stable on current psychiatric medication. Allergies: Allergies  Allergen Reactions  . Sulfa Antibiotics Anaphylaxis  . Ibuprofen   Medical History: Past Medical History  Diagnosis Date  . Diabetes mellitus type I   . Hyperlipidemia   . Vertigo   . GERD (gastroesophageal reflux disease)   . Systemic hypertension   . Paroxysmal atrial fibrillation   . Schizophrenia   Patient has history of vertigo, GERD, hyperlipidemia, diabetes mellitus.  Patient is also legally blind.  She see Dr. Karren Burly for her annual checkup and will follow up for elevated Hemo A1c with him. Surgical History: Past Surgical History  Procedure Laterality Date  . Brain tumor excision    . US echocardiography  12/20/2003    mild mitral annular ca+,mild MR,TR,PI,AOV mildly sclerotic  . Nm myocar perf wall motion  02/01/2007    no significant ischemia  Reviewed during this appointment. Vitals: BP 137/80  Pulse 83  Ht 5\' 8"  (1.727 m)  Wt 187 lb 6.4 oz (85.004 kg)  BMI 28.50 kg/m2 Wt down almost 3  pounds.  Mental status examination Patient is casually dressed and fairly groomed. Patient is legally blind and need her husband`s  assistance for walking. She described her mood as ok.  She is calm and not at all fidgety today. Her affect is blunted. Her speech is sparse and she answers in monosyllables She denies any active or passive suicidal thinking and homicidal thinking. Her thought process is significant for delusional thinking . She is very blunted . She denies any auditory or visual hallucination.  She has poverty of thought content.  She's alert and oriented x3 her insight judgment and impulse control is okay. There no tremors shakes or extrapyramidal side effects noted.  Lab Results:  Results for orders placed in visit on 09/11/13 (from the past 8736 hour(s))  LIPID PANEL   Collection Time    09/11/13 11:01 AM      Result Value Ref Range   Cholesterol 127  0 - 200 mg/dL   Triglycerides 45  <150 mg/dL   HDL 59  >39 mg/dL   Total CHOL/HDL Ratio 2.2     VLDL 9  0 - 40 mg/dL   LDL Cholesterol 59  0 - 99 mg/dL  COMPREHENSIVE METABOLIC PANEL   Collection Time    09/11/13 11:01 AM      Result Value Ref Range   Sodium 137  135 - 145 mEq/L   Potassium 4.0  3.5 - 5.3 mEq/L   Chloride 102  96 - 112 mEq/L   CO2 29  19 - 32 mEq/L   Glucose, Bld 123 (*) 70 - 99 mg/dL   BUN 7  6 - 23 mg/dL   Creat 0.81  0.50 - 1.10 mg/dL   Total Bilirubin 0.5  0.2 - 1.2 mg/dL   Alkaline Phosphatase 63  39 - 117 U/L   AST 13  0 - 37 U/L   ALT 8  0 - 35 U/L   Total Protein 6.7  6.0 - 8.3 g/dL   Albumin 3.9  3.5 - 5.2 g/dL   Calcium 9.9  8.4 - 10.5 mg/dL   Assessment Axis Ischizophrenia Axis II deferred Axis III see medical history Axis IV mild to moderate  Axis V 60 Plan: I took her vitals.  I reviewed CC, tobacco/med/surg Hx, meds effects/ side effects, problem list, therapies and responses as well as current situation/symptoms discussed options. She'll continue Paxil at 40 mg each bedtime.   trazodone at 75 mg each bedtime and continue Risperdal 2 mg each bedtime. She'll continue Xanax as needed and return to see me in 3 months Her husband will call me if her situation worsens See orders and pt instructions for more details.  MEDICATIONS this encounter: Meds ordered this encounter  Medications  . DISCONTD: risperiDONE (RISPERDAL) 2 MG tablet    Sig: Take 2 mg by mouth at bedtime.  . ALPRAZolam (XANAX) 0.25 MG tablet  Sig: Take 0.25 mg by mouth 3 (three) times daily.  Marland Kitchen DISCONTD: traZODone (DESYREL) 150 MG tablet    Sig: Take 75 mg by mouth at bedtime.  . risperiDONE (RISPERDAL) 2 MG tablet    Sig: Take 1 tablet (2 mg total) by mouth at bedtime.    Dispense:  90 tablet    Refill:  2  . traZODone (DESYREL) 150 MG tablet    Sig: Take 0.5 tablets (75 mg total) by mouth at bedtime.    Dispense:  45 tablet    Refill:  2    Medical Decision Making Problem Points:  Established problem, stable/improving (1), Established problem, worsening (2), Review of last therapy session (1) and Review of psycho-social stressors (1) Data Points:  Review or order medicine tests (1) Review of medication regiment & side effects (2) Review of new medications or change in dosage (2)  I certify that outpatient services furnished can reasonably be expected to improve the patient's condition.   Levonne Spiller, MD

## 2013-09-29 ENCOUNTER — Telehealth: Payer: Self-pay | Admitting: Cardiovascular Disease

## 2013-09-29 MED ORDER — DILTIAZEM HCL ER COATED BEADS 180 MG PO CP24: 180.0000 mg | ORAL_CAPSULE | Freq: Every day | ORAL | Status: DC

## 2013-09-29 NOTE — Telephone Encounter (Signed)
Rx refill sent to patient pharmacy   

## 2013-09-29 NOTE — Telephone Encounter (Signed)
Pt need her Diltiazem 180 mg #90 called in to [facility] please.

## 2013-10-13 ENCOUNTER — Ambulatory Visit (HOSPITAL_COMMUNITY): Payer: Self-pay | Admitting: Psychiatry

## 2013-12-04 ENCOUNTER — Emergency Department (HOSPITAL_COMMUNITY): Payer: BC Managed Care – PPO

## 2013-12-04 ENCOUNTER — Encounter (HOSPITAL_COMMUNITY): Payer: Self-pay | Admitting: Emergency Medicine

## 2013-12-04 ENCOUNTER — Inpatient Hospital Stay (HOSPITAL_COMMUNITY)
Admission: EM | Admit: 2013-12-04 | Discharge: 2013-12-05 | DRG: 074 | Disposition: A | Discharge status: Home / Self Care | Payer: BC Managed Care – PPO | Attending: Internal Medicine | Admitting: Internal Medicine

## 2013-12-04 DIAGNOSIS — H540 Blindness, both eyes: Secondary | ICD-10-CM | POA: Diagnosis present

## 2013-12-04 DIAGNOSIS — Z8669 Personal history of other diseases of the nervous system and sense organs: Secondary | ICD-10-CM | POA: Diagnosis not present

## 2013-12-04 DIAGNOSIS — R29810 Facial weakness: Secondary | ICD-10-CM | POA: Diagnosis present

## 2013-12-04 DIAGNOSIS — E1042 Type 1 diabetes mellitus with diabetic polyneuropathy: Secondary | ICD-10-CM | POA: Diagnosis present

## 2013-12-04 DIAGNOSIS — I480 Paroxysmal atrial fibrillation: Secondary | ICD-10-CM | POA: Diagnosis present

## 2013-12-04 DIAGNOSIS — I10 Essential (primary) hypertension: Secondary | ICD-10-CM | POA: Diagnosis present

## 2013-12-04 DIAGNOSIS — E785 Hyperlipidemia, unspecified: Secondary | ICD-10-CM | POA: Diagnosis present

## 2013-12-04 DIAGNOSIS — Z794 Long term (current) use of insulin: Secondary | ICD-10-CM

## 2013-12-04 DIAGNOSIS — F209 Schizophrenia, unspecified: Secondary | ICD-10-CM | POA: Diagnosis present

## 2013-12-04 DIAGNOSIS — Z79899 Other long term (current) drug therapy: Secondary | ICD-10-CM | POA: Diagnosis not present

## 2013-12-04 DIAGNOSIS — Z7982 Long term (current) use of aspirin: Secondary | ICD-10-CM

## 2013-12-04 DIAGNOSIS — K219 Gastro-esophageal reflux disease without esophagitis: Secondary | ICD-10-CM | POA: Diagnosis present

## 2013-12-04 DIAGNOSIS — G510 Bell's palsy: Secondary | ICD-10-CM

## 2013-12-04 DIAGNOSIS — D763 Other histiocytosis syndromes: Secondary | ICD-10-CM | POA: Diagnosis present

## 2013-12-04 DIAGNOSIS — N390 Urinary tract infection, site not specified: Secondary | ICD-10-CM | POA: Diagnosis present

## 2013-12-04 DIAGNOSIS — R4182 Altered mental status, unspecified: Secondary | ICD-10-CM

## 2013-12-04 DIAGNOSIS — R531 Weakness: Secondary | ICD-10-CM | POA: Diagnosis not present

## 2013-12-04 DIAGNOSIS — Z87898 Personal history of other specified conditions: Secondary | ICD-10-CM

## 2013-12-04 DIAGNOSIS — I739 Peripheral vascular disease, unspecified: Secondary | ICD-10-CM | POA: Diagnosis present

## 2013-12-04 LAB — DIGOXIN LEVEL: DIGOXIN LVL: 0.7 ng/mL — AB (ref 0.8–2.0)

## 2013-12-04 LAB — URINALYSIS, ROUTINE W REFLEX MICROSCOPIC
Bilirubin Urine: NEGATIVE
GLUCOSE, UA: NEGATIVE mg/dL
Ketones, ur: 15 mg/dL — AB
LEUKOCYTES UA: NEGATIVE
Nitrite: NEGATIVE
PH: 6 (ref 5.0–8.0)
PROTEIN: NEGATIVE mg/dL
SPECIFIC GRAVITY, URINE: 1.025 (ref 1.005–1.030)
Urobilinogen, UA: 0.2 mg/dL (ref 0.0–1.0)

## 2013-12-04 LAB — CBC WITH DIFFERENTIAL/PLATELET
BASOS ABS: 0 10*3/uL (ref 0.0–0.1)
BASOS PCT: 1 % (ref 0–1)
EOS ABS: 0.2 10*3/uL (ref 0.0–0.7)
Eosinophils Relative: 3 % (ref 0–5)
HCT: 39.1 % (ref 36.0–46.0)
HEMOGLOBIN: 13.1 g/dL (ref 12.0–15.0)
Lymphocytes Relative: 41 % (ref 12–46)
Lymphs Abs: 2.9 10*3/uL (ref 0.7–4.0)
MCH: 30 pg (ref 26.0–34.0)
MCHC: 33.5 g/dL (ref 30.0–36.0)
MCV: 89.5 fL (ref 78.0–100.0)
MONO ABS: 0.7 10*3/uL (ref 0.1–1.0)
MONOS PCT: 10 % (ref 3–12)
NEUTROS PCT: 45 % (ref 43–77)
Neutro Abs: 3.2 10*3/uL (ref 1.7–7.7)
Platelets: 262 10*3/uL (ref 150–400)
RBC: 4.37 MIL/uL (ref 3.87–5.11)
RDW: 12.6 % (ref 11.5–15.5)
WBC: 7.1 10*3/uL (ref 4.0–10.5)

## 2013-12-04 LAB — COMPREHENSIVE METABOLIC PANEL
ALBUMIN: 3.8 g/dL (ref 3.5–5.2)
ALT: 9 U/L (ref 0–35)
AST: 14 U/L (ref 0–37)
Alkaline Phosphatase: 74 U/L (ref 39–117)
Anion gap: 10 (ref 5–15)
BUN: 5 mg/dL — ABNORMAL LOW (ref 6–23)
CO2: 27 mEq/L (ref 19–32)
CREATININE: 0.81 mg/dL (ref 0.50–1.10)
Calcium: 10.1 mg/dL (ref 8.4–10.5)
Chloride: 102 mEq/L (ref 96–112)
GFR calc Af Amer: 89 mL/min — ABNORMAL LOW (ref >90)
GFR calc non Af Amer: 77 mL/min — ABNORMAL LOW (ref >90)
Glucose, Bld: 139 mg/dL — ABNORMAL HIGH (ref 70–99)
Potassium: 3.4 mEq/L — ABNORMAL LOW (ref 3.7–5.3)
Sodium: 139 mEq/L (ref 137–147)
TOTAL PROTEIN: 7.5 g/dL (ref 6.0–8.3)
Total Bilirubin: 0.4 mg/dL (ref 0.3–1.2)

## 2013-12-04 LAB — RAPID URINE DRUG SCREEN, HOSP PERFORMED
Amphetamines: NOT DETECTED
Barbiturates: NOT DETECTED
Benzodiazepines: POSITIVE — AB
Cocaine: NOT DETECTED
Opiates: NOT DETECTED
Tetrahydrocannabinol: NOT DETECTED

## 2013-12-04 LAB — URINE MICROSCOPIC-ADD ON

## 2013-12-04 LAB — PROTIME-INR
INR: 1.03 (ref 0.00–1.49)
PROTHROMBIN TIME: 13.6 s (ref 11.6–15.2)

## 2013-12-04 LAB — ETHANOL

## 2013-12-04 LAB — GLUCOSE, CAPILLARY: GLUCOSE-CAPILLARY: 208 mg/dL — AB (ref 70–99)

## 2013-12-04 LAB — CBG MONITORING, ED: Glucose-Capillary: 118 mg/dL — ABNORMAL HIGH (ref 70–99)

## 2013-12-04 MED ORDER — GADOBENATE DIMEGLUMINE 529 MG/ML IV SOLN
15.0000 mL | Freq: Once | INTRAVENOUS | Status: AC | PRN
  Administered 2013-12-04: 15 mL via INTRAVENOUS

## 2013-12-04 MED ORDER — GABAPENTIN 300 MG PO CAPS
300.0000 mg | ORAL_CAPSULE | Freq: Every day | ORAL | Status: DC
  Administered 2013-12-04: 300 mg via ORAL
  Filled 2013-12-04: qty 1

## 2013-12-04 MED ORDER — INSULIN ASPART 100 UNIT/ML ~~LOC~~ SOLN
0.0000 [IU] | Freq: Every day | SUBCUTANEOUS | Status: DC
  Administered 2013-12-04: 2 [IU] via SUBCUTANEOUS

## 2013-12-04 MED ORDER — ALPRAZOLAM 0.25 MG PO TABS
0.2500 mg | ORAL_TABLET | Freq: Three times a day (TID) | ORAL | Status: DC
  Administered 2013-12-04 – 2013-12-05 (×2): 0.25 mg via ORAL
  Filled 2013-12-04 (×2): qty 1

## 2013-12-04 MED ORDER — MECLIZINE HCL 12.5 MG PO TABS
25.0000 mg | ORAL_TABLET | Freq: Every day | ORAL | Status: DC
  Administered 2013-12-05: 25 mg via ORAL
  Filled 2013-12-04: qty 2

## 2013-12-04 MED ORDER — DEXTROSE 5 % IV SOLN
1.0000 g | INTRAVENOUS | Status: DC
  Administered 2013-12-04: 1 g via INTRAVENOUS
  Filled 2013-12-04 (×2): qty 10

## 2013-12-04 MED ORDER — ONDANSETRON HCL 4 MG/2ML IJ SOLN: 4.0000 mg | Freq: Four times a day (QID) | INTRAMUSCULAR | Status: DC | PRN

## 2013-12-04 MED ORDER — DILTIAZEM HCL ER COATED BEADS 180 MG PO CP24
180.0000 mg | ORAL_CAPSULE | Freq: Every day | ORAL | Status: DC
  Administered 2013-12-05: 180 mg via ORAL
  Filled 2013-12-04: qty 1

## 2013-12-04 MED ORDER — TRAZODONE HCL 50 MG PO TABS
75.0000 mg | ORAL_TABLET | Freq: Every day | ORAL | Status: DC
  Administered 2013-12-04: 75 mg via ORAL
  Filled 2013-12-04: qty 2

## 2013-12-04 MED ORDER — ROSUVASTATIN CALCIUM 10 MG PO TABS
10.0000 mg | ORAL_TABLET | Freq: Every day | ORAL | Status: DC
  Administered 2013-12-04: 10 mg via ORAL
  Filled 2013-12-04: qty 1

## 2013-12-04 MED ORDER — DIGOXIN 125 MCG PO TABS
0.1250 mg | ORAL_TABLET | Freq: Every day | ORAL | Status: DC
  Administered 2013-12-05: 0.125 mg via ORAL
  Filled 2013-12-04: qty 1

## 2013-12-04 MED ORDER — ASPIRIN 325 MG PO TABS
325.0000 mg | ORAL_TABLET | Freq: Every day | ORAL | Status: DC
  Administered 2013-12-05: 325 mg via ORAL
  Filled 2013-12-04: qty 1

## 2013-12-04 MED ORDER — RISPERIDONE 1 MG PO TABS
2.0000 mg | ORAL_TABLET | Freq: Every day | ORAL | Status: DC
  Administered 2013-12-04: 2 mg via ORAL
  Filled 2013-12-04: qty 2

## 2013-12-04 MED ORDER — ONDANSETRON HCL 4 MG PO TABS: 4.0000 mg | ORAL_TABLET | Freq: Four times a day (QID) | ORAL | Status: DC | PRN

## 2013-12-04 MED ORDER — INSULIN NPH (HUMAN) (ISOPHANE) 100 UNIT/ML ~~LOC~~ SUSP
12.0000 [IU] | Freq: Two times a day (BID) | SUBCUTANEOUS | Status: DC
  Administered 2013-12-04 – 2013-12-05 (×2): 12 [IU] via SUBCUTANEOUS
  Filled 2013-12-04: qty 10

## 2013-12-04 MED ORDER — FAMOTIDINE 20 MG PO TABS: 20.0000 mg | ORAL_TABLET | Freq: Every day | ORAL | Status: DC

## 2013-12-04 MED ORDER — FAMOTIDINE 20 MG PO TABS
20.0000 mg | ORAL_TABLET | Freq: Every day | ORAL | Status: DC
  Administered 2013-12-05: 20 mg via ORAL
  Filled 2013-12-04: qty 1

## 2013-12-04 MED ORDER — PAROXETINE HCL 20 MG PO TABS
40.0000 mg | ORAL_TABLET | Freq: Every day | ORAL | Status: DC
  Administered 2013-12-04 – 2013-12-05 (×2): 40 mg via ORAL
  Filled 2013-12-04 (×2): qty 2

## 2013-12-04 MED ORDER — DEXTROSE 5 % IV SOLN
INTRAVENOUS | Status: AC
  Filled 2013-12-04: qty 10

## 2013-12-04 MED ORDER — ACETAMINOPHEN 500 MG PO TABS
1000.0000 mg | ORAL_TABLET | Freq: Four times a day (QID) | ORAL | Status: DC | PRN
  Administered 2013-12-04 – 2013-12-05 (×2): 1000 mg via ORAL
  Filled 2013-12-04 (×2): qty 2

## 2013-12-04 MED ORDER — HEPARIN SODIUM (PORCINE) 5000 UNIT/ML IJ SOLN
5000.0000 [IU] | Freq: Three times a day (TID) | INTRAMUSCULAR | Status: DC
  Administered 2013-12-04 – 2013-12-05 (×2): 5000 [IU] via SUBCUTANEOUS
  Filled 2013-12-04 (×2): qty 1

## 2013-12-04 MED ORDER — INSULIN ASPART 100 UNIT/ML ~~LOC~~ SOLN
0.0000 [IU] | Freq: Three times a day (TID) | SUBCUTANEOUS | Status: DC
  Administered 2013-12-05: 4 [IU] via SUBCUTANEOUS
  Administered 2013-12-05: 3 [IU] via SUBCUTANEOUS

## 2013-12-04 MED ORDER — POTASSIUM CHLORIDE CRYS ER 10 MEQ PO TBCR
10.0000 meq | EXTENDED_RELEASE_TABLET | Freq: Every day | ORAL | Status: DC
  Administered 2013-12-05: 10 meq via ORAL
  Filled 2013-12-04: qty 1

## 2013-12-04 MED ORDER — INSULIN NPH (HUMAN) (ISOPHANE) 100 UNIT/ML ~~LOC~~ SUSP
SUBCUTANEOUS | Status: AC
  Filled 2013-12-04: qty 10

## 2013-12-04 MED ORDER — MECLIZINE HCL 12.5 MG PO TABS: 25.0000 mg | ORAL_TABLET | Freq: Every day | ORAL | Status: DC

## 2013-12-04 NOTE — ED Provider Notes (Signed)
CSN: [id number]     Arrival date & time 12/04/13  1339 History  This chart was scribed for [employer]. Alvino Chapel, MD by Rayfield Citizen, ED Scribe. This patient was seen in room APA15/APA15 and the patient's care was started at 1:59 PM.   Level 5 Caveat; AMS    Chief Complaint  Patient presents with  . Facial Droop   The history is provided by the patient and a relative. No language interpreter was used.    HPI Comments: Rachel [name] is a 63 y.o. female who presents to the Emergency Department complaining of weakness and facial droop for 3-4 days. She also reports a right sided headache, beginning earlier today.   Patient is blind in her left eye; this is a baseline condition for her.   Patient has a history of brain surgery; she had a tumor removed. She was last seen for this issue 3 years P[facility]). According to her companion, patient's baseline condition is alert, oriented, and talkative.   Past Medical History  Diagnosis Date  . Diabetes mellitus type I   . Hyperlipidemia   . Vertigo   . GERD (gastroesophageal reflux disease)   . Systemic hypertension   . Paroxysmal atrial fibrillation   . Schizophrenia    Past Surgical History  Procedure Laterality Date  . Brain tumor excision    . US echocardiography  12/20/2003    mild mitral annular ca+,mild MR,TR,PI,AOV mildly sclerotic  . Nm myocar perf wall motion  02/01/2007    no significant ischemia   Family History  Problem Relation Age of Onset  . ADD / ADHD Neg Hx   . Alcohol abuse Neg Hx   . Drug abuse Neg Hx   . Anxiety disorder Neg Hx   . Bipolar disorder Neg Hx   . Dementia Neg Hx   . Depression Neg Hx   . OCD Neg Hx   . Paranoid behavior Neg Hx   . Schizophrenia Neg Hx   . Seizures Neg Hx   . Sexual abuse Neg Hx   . Physical abuse Neg Hx    History  Substance Use Topics  . Smoking status: Never Smoker   . Smokeless tobacco: Never Used  . Alcohol Use: No   OB History   Grav Para Term  Preterm Abortions TAB SAB Ect Mult Living                 Review of Systems  Unable to perform ROS Neurological: Positive for facial asymmetry, speech difficulty, weakness and headaches.    Allergies  Sulfa antibiotics and Ibuprofen  Home Medications   Prior to Admission medications   Medication Sig Start Date End Date Taking? Authorizing Provider  acetaminophen (TYLENOL) 500 MG tablet Take 1,000 mg by mouth every 6 (six) hours as needed for mild pain.   Yes Historical Provider, MD  ALPRAZolam (XANAX) 0.25 MG tablet Take 1 tablet (0.25 mg total) by mouth 3 (three) times daily. 09/26/13  Yes Levonne Spiller, MD  aspirin 325 MG tablet Take 325 mg by mouth daily.   Yes Historical Provider, MD  digoxin (LANOXIN) 0.125 MG tablet Take 1 tablet (0.125 mg total) by mouth daily. 09/26/13  Yes Mihai Croitoru, MD  diltiazem (CARDIZEM CD) 180 MG 24 hr capsule Take 1 capsule (180 mg total) by mouth daily. 09/29/13  Yes Mihai Croitoru, MD  famotidine (PEPCID) 20 MG tablet Take 20 mg by mouth daily.  01/08/11  Yes Historical Provider, MD  gabapentin (  NEURONTIN) 300 MG capsule Take 300 mg by mouth at bedtime.  01/08/11  Yes Historical Provider, MD  KLOR-CON 10 10 MEQ tablet Take 10 mEq by mouth daily.  04/06/11  Yes Historical Provider, MD  meclizine (ANTIVERT) 25 MG tablet Take 25 mg by mouth daily. 07/23/13  Yes Historical Provider, MD  NOVOLIN N 100 UNIT/ML injection Inject 12 Units into the skin 2 (two) times daily at 8 am and 10 pm.  11/25/10  Yes Historical Provider, MD  PARoxetine (PAXIL) 40 MG tablet Take 1 tablet (40 mg total) by mouth daily. 08/25/13 08/25/14 Yes Levonne Spiller, MD  risperiDONE (RISPERDAL) 2 MG tablet Take 1 tablet (2 mg total) by mouth at bedtime. 09/26/13  Yes Levonne Spiller, MD  rosuvastatin (CRESTOR) 10 MG tablet Take 1 tablet (10 mg total) by mouth daily. 01/16/13  Yes Mihai Croitoru, MD  traZODone (DESYREL) 150 MG tablet Take 0.5 tablets (75 mg total) by mouth at bedtime. 09/26/13  Yes  Levonne Spiller, MD   BP 150/82  Pulse 63  Temp(Src) 97.9 F (36.6 C) (Oral)  Resp 17  SpO2 95% Physical Exam  Nursing note and vitals reviewed. Constitutional: She appears well-developed and well-nourished.  HENT:  Head: Normocephalic and atraumatic.  Neck: No tracheal deviation present.  Cardiovascular: Normal rate.   Pulmonary/Chest: Effort normal.  Neurological:  Patient is awake but appears somewhat confused. Right-sided facial droop. Pupils are both reactive. Extraocular movements intact. Some drooping of right eyelid. Good grip strength bilaterally. Difficulty with ambulation with a shuffling gait. Finger-nose appears intact bilaterally, however she had some difficulty on the left side that resolved when the vision went more towards her right eye. She is reportedly blind in her left eye. Good strength lateral lower extremities  Skin: Skin is warm and dry.  Psychiatric: She has a normal mood and affect. Her behavior is normal.    ED Course  Procedures   DIAGNOSTIC STUDIES: COORDINATION OF CARE: 2:06 PM Discussed treatment plan with pt at bedside; patient will need to undergo further study to rule out CVA. Patient and companion agreed to plan.   Labs Review Labs Reviewed  COMPREHENSIVE METABOLIC PANEL - Abnormal; Notable for the following:    Potassium 3.4 (*)    Glucose, Bld 139 (*)    BUN 5 (*)    GFR calc non Af Amer 77 (*)    GFR calc Af Amer 89 (*)    All other components within normal limits  DIGOXIN LEVEL - Abnormal; Notable for the following:    Digoxin Level 0.7 (*)    All other components within normal limits  URINE RAPID DRUG SCREEN (HOSP PERFORMED) - Abnormal; Notable for the following:    Benzodiazepines POSITIVE (*)    All other components within normal limits  URINALYSIS, ROUTINE W REFLEX MICROSCOPIC - Abnormal; Notable for the following:    Hgb urine dipstick SMALL (*)    Ketones, ur 15 (*)    All other components within normal limits  URINE  MICROSCOPIC-ADD ON - Abnormal; Notable for the following:    Squamous Epithelial / LPF FEW (*)    Bacteria, UA FEW (*)    All other components within normal limits  CBG MONITORING, ED - Abnormal; Notable for the following:    Glucose-Capillary 118 (*)    All other components within normal limits  URINE CULTURE  CBC WITH DIFFERENTIAL  PROTIME-INR  ETHANOL  CBC  CREATININE, SERUM  TSH  COMPREHENSIVE METABOLIC PANEL    Imaging Review  Dg Chest 1 View  12/04/2013   CLINICAL DATA:  Decreased level of consciousness and difficulty walking for 2 days.  EXAM: CHEST - 1 VIEW  COMPARISON:  04/25/2004  FINDINGS: The heart is mildly enlarged but stable. There is tortuosity, ectasia and calcification of the thoracic aorta. Mild central vascular congestion without overt pulmonary edema. Low lung volumes with vascular crowding and bibasilar atelectasis. There is progressive elevation of the right hemidiaphragm. The bony thorax is intact.  IMPRESSION: Mild cardiac enlargement and moderate central vascular congestion. No overt pulmonary edema.  Low lung volumes with vascular crowding and bibasilar atelectasis.   Electronically Signed   By: Kalman Jewels M.D.   On: 12/04/2013 14:29   Ct Head Wo Contrast  12/04/2013   CLINICAL DATA:  Altered mental status, decreased level of consciousness, difficulty walking for 2 days, LEFT and RIGHT side weakness, personal history of diabetes mellitus type 1, systemic hypertension, atrial fibrillation  EXAM: CT HEAD WITHOUT CONTRAST  TECHNIQUE: Contiguous axial images were obtained from the base of the skull through the vertex without intravenous contrast.  COMPARISON:  04/25/2004  FINDINGS: Prior suboccipital craniotomy into foramen magnum.  Generalized atrophy.  Normal ventricular morphology.  No midline shift or mass effect.  Normal appearance of brain parenchyma.  No intracranial hemorrhage, mass lesion, evidence acute infarction or extra-axial fluid collection.  Bones  and sinuses otherwise unremarkable.  IMPRESSION: Prior suboccipital craniotomy.  Generalized atrophy.  No acute Intracranial abnormalities.   Electronically Signed   By: Lavonia Dana M.D.   On: 12/04/2013 14:26   Mr Jeri Cos EN Contrast  12/04/2013   CLINICAL DATA:  Altered mental status. Craniotomy for tumor removal. Decreased level of consciousness. Difficulty walking.  EXAM: MRI HEAD WITHOUT AND WITH CONTRAST  TECHNIQUE: Multiplanar, multiecho pulse sequences of the brain and surrounding structures were obtained without and with intravenous contrast.  CONTRAST:  60mL MULTIHANCE GADOBENATE DIMEGLUMINE 529 MG/ML IV SOLN  COMPARISON:  CT head earlier today.  MR head performed 10/27/2004.  FINDINGS: Stable suboccipital craniectomy. No acute stroke, hemorrhage, hydrocephalus, or extra-axial fluid. Generalized atrophy with chronic microvascular ischemic change. Scattered foci of chronic hemorrhage, predominantly posterior fossa in location, likely postoperative.  Redemonstrated are abnormal extra-axial foci of enhancement in the RIGHT cavernous sinus, and LEFT middle cranial fossa anteriorly unchanged in 2006 in this patient with a diagnosis of Rosai-Dorfman disease. Both lesions display mild restricted diffusion consistent with a histoproliferative origin. The more easily measurable lesion, in the LEFT middle cranial fossa, is unchanged.  Flow voids are maintained. No significant sinus fluid accumulation. No significant orbital pathology. Partial empty sella.  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  No evidence for acute stroke or significant change since 2006.   Electronically Signed   By: Rolla Flatten M.D.   On: 12/04/2013 16:47     EKG Interpretation   Date/Time:  Monday December 04 2013 13:50:58 EDT Ventricular Rate:  71 PR Interval:  167 QRS Duration: 105 QT  Interval:  415 QTC Calculation: 451 R Axis:   -34 Text Interpretation:  Sinus rhythm RSR' in V1 or V2, probably normal  variant Inferior infarct, old Confirmed by Alvino Chapel  MD, Jan Olano [phone])  on 12/04/2013 2:45:57 PM      MDM   Final diagnoses:  Altered mental status  Weakness  UTI (lower urinary tract infection)   Patient was facial droop and some altered mental  status. Also may have some difficulty with walking. Head CT reassuring and an MRI done to previous history of Rosai-dorfman. MRI stable without stroke. Will admit to internal medicine. Did have possible UTI  I personally performed the services described in this documentation, which was scribed in my presence. The recorded information has been reviewed and is accurate.     Jasper Riling. Alvino Chapel, MD 12/04/13 [phone]

## 2013-12-04 NOTE — ED Notes (Signed)
Decreased loc and difficulty walking for 2 days.  Weaker on left.ext

## 2013-12-04 NOTE — Consult Note (Addendum)
[place] A. Merlene Laughter, MD     [email or web address]          Rachel [name] is an 63 y.o. female.   ASSESSMENT/PLAN: 1. Difacial paresis worse on the right side. This may explain some of the poor verbal response of the patient. Etiology likely due to underlying brain tumor. Outpatient consultation with her neurosurgeon is suggested.  2. Marked psychomotor slowing of unclear etiology. My suspicion is that this is due to long-standing history of neuroleptic use to treat schizophrenia. I would suggest that the dose of respirdal be reduced to 0.5 mg. Dementia labs will be obtained. An EEG will be obtained.  3. Likely diabetic polyneuropathy.   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 The patient has had this diagnosis for many years. Review of the records indicate that she underwent suboccipital craniectomy for CNS involvement. It appears that the patient had these lesions which were diagnosed and the brain biopsy indicated that diagnosis. She apparently was treated with the chemotherapy. Review of her CT scan and MRI are reports indicates that the lesion has remained unchanged from a radiographic standpoint. She has not seen her neurosurgeon in many years. The history is obtained from speaking to the patient's husband. The patient was quite slow in answering questions and the husband essentially provided all the history. I did question the husband about this and probably and he told me that this is her baseline cognition. They have been married for over 40 years. I did voiced my concern to him that thought she was slow cognitively. However, again he told the that she's been laid this ever since they've married and ever since she had her brain surgery. The patient does not report any other complaints other than posterior headache especially the left side. It appears that the  patient decided to seek medical attention on the urging of her husband. She developed facial asymmetry and some slurring of her speech but 3 days ago. It appeared that the onset was rather abrupt but got somewhat worse and it resulted in them coming to the emergency room. The chart reports that she may have had some confusion with the husband does not corroborate this. 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 She sees of her ophthalmologist Dr. Gershon Crane at least once a year. She has not seen her neurosurgeon in several years.  GENERAL: She is in no acute distress. She seemed to have marked psychomotor slowing.  HEENT: Supple. Atraumatic normocephalic.   ABDOMEN: soft  EXTREMITIES: No edema   BACK: Normal.  SKIN: Normal by inspection.    MENTAL STATUS: She is awake and alert. She has marked bradyphrenia and generally slow to respond. Speech is markedly dysarthric.  CRANIAL NERVES: Pupils are equal, round and reactive to light although there appears to be subtle evidence of a right afebrile and pupillary defect; extra ocular movements are full, there is no significant nystagmus; visual fields are full- L-- markedly impaired vision involving the right eye( shadows only); there is a markedly impaired facial movements bilaterally and involving the upper and lower facial muscles; there is flattening of the nasolabial fold on the right ; uvula is midline; shoulder elevation is normal.  MOTOR: Normal tone, bulk and strength; no pronator drift.  COORDINATION: Left finger to nose is normal, right finger to nose is normal, No rest tremor; no intention tremor; no postural tremor;  no bradykinesia.  REFLEXES: Deep tendon reflexes are symmetrical and normal in the upper extremities but markedly diminished in the legs. Babinski reflexes are flexor bilaterally.   SENSATION: Normal to light touch.     Blood pressure 158/74, pulse 70, temperature 98.4 F (36.9 C), temperature  source Oral, resp. rate 18, height 5' 9"  (1.753 m), weight 83.008 kg (183 lb), SpO2 99.00%.  Past Medical History  Diagnosis Date  . Diabetes mellitus type I   . Hyperlipidemia   . Vertigo   . GERD (gastroesophageal reflux disease)   . Systemic hypertension   . Paroxysmal atrial fibrillation   . Schizophrenia     2012 ADMISSION DIAGNOSES:  1. New onset rapid atrial fibrillation, status post emergent direct current  cardioversion.  2. Cardiogenic shock, resolved.  3. Hypertension.  4. Schizophrenia.  5. History of brain tumor, status post resection and chemotherapy.  6. Insulin-dependent diabetes mellitus.  DISCHARGE DIAGNOSES:  1. Status post direct current cardioversion from rapid atrial fibrillation  with cardiogenic shock to normal sinus rhythm. Not a Coumadin candidate  due to schizophrenia.  2. Cardiogenic shock, resolved.  3. Hypertension.  4. Schizophrenia.  5. History of brain tumor, status post resection and chemotherapy.  6. Insulin-dependent diabetes mellitus.  7. Hypokalemia, repleted.   Past Surgical History  Procedure Laterality Date  . Brain tumor excision    . US echocardiography  12/20/2003    mild mitral annular ca+,mild MR,TR,PI,AOV mildly sclerotic  . Nm myocar perf wall motion  02/01/2007    no significant ischemia    Family History  Problem Relation Age of Onset  . ADD / ADHD Neg Hx   . Alcohol abuse Neg Hx   . Drug abuse Neg Hx   . Anxiety disorder Neg Hx   . Bipolar disorder Neg Hx   . Dementia Neg Hx   . Depression Neg Hx   . OCD Neg Hx   . Paranoid behavior Neg Hx   . Schizophrenia Neg Hx   . Seizures Neg Hx   . Sexual abuse Neg Hx   . Physical abuse Neg Hx     Social History:  reports that she has never smoked. She has never used smokeless tobacco. She reports that she does not drink alcohol or use illicit drugs.  Allergies:  Allergies  Allergen Reactions  . Sulfa Antibiotics Anaphylaxis  . Ibuprofen Other (See Comments)     unknown    Medications: Prior to Admission medications   Medication Sig Start Date End Date Taking? Authorizing Provider  acetaminophen (TYLENOL) 500 MG tablet Take 1,000 mg by mouth every 6 (six) hours as needed for mild pain.   Yes Historical Provider, MD  ALPRAZolam (XANAX) 0.25 MG tablet Take 1 tablet (0.25 mg total) by mouth 3 (three) times daily. 09/26/13  Yes Levonne Spiller, MD  aspirin 325 MG tablet Take 325 mg by mouth daily.   Yes Historical Provider, MD  digoxin (LANOXIN) 0.125 MG tablet Take 1 tablet (0.125 mg total) by mouth daily. 09/26/13  Yes Mihai Croitoru, MD  diltiazem (CARDIZEM CD) 180 MG 24 hr capsule Take 1 capsule (180 mg total) by mouth daily. 09/29/13  Yes Mihai Croitoru, MD  famotidine (PEPCID) 20 MG tablet Take 20 mg by mouth daily.  01/08/11  Yes Historical Provider, MD  gabapentin (NEURONTIN) 300 MG capsule Take 300 mg by mouth at bedtime.  01/08/11  Yes Historical Provider, MD  KLOR-CON 10 10 MEQ tablet Take 10 mEq by mouth daily.  04/06/11  Yes Historical Provider, MD  meclizine (ANTIVERT) 25 MG tablet Take 25 mg by mouth daily. 07/23/13  Yes Historical Provider, MD  NOVOLIN N 100 UNIT/ML injection Inject 12 Units into the skin 2 (two) times daily at 8 am and 10 pm.  11/25/10  Yes Historical Provider, MD  PARoxetine (PAXIL) 40 MG tablet Take 1 tablet (40 mg total) by mouth daily. 08/25/13 08/25/14 Yes Levonne Spiller, MD  risperiDONE (RISPERDAL) 2 MG tablet Take 1 tablet (2 mg total) by mouth at bedtime. 09/26/13  Yes Levonne Spiller, MD  rosuvastatin (CRESTOR) 10 MG tablet Take 1 tablet (10 mg total) by mouth daily. 01/16/13  Yes Mihai Croitoru, MD  traZODone (DESYREL) 150 MG tablet Take 0.5 tablets (75 mg total) by mouth at bedtime. 09/26/13  Yes Levonne Spiller, MD    Scheduled Meds: . ALPRAZolam  0.25 mg Oral TID  . aspirin  325 mg Oral Daily  . cefTRIAXone (ROCEPHIN)  IV  1 g Intravenous Q24H  . [START ON 12/05/2013] digoxin  0.125 mg Oral Daily  . [START ON 12/05/2013]  diltiazem  180 mg Oral Daily  . [START ON 12/05/2013] famotidine  20 mg Oral Daily  . gabapentin  300 mg Oral QHS  . heparin  5,000 Units Subcutaneous 3 times per day  . [START ON 12/05/2013] insulin aspart  0-20 Units Subcutaneous TID WC  . insulin aspart  0-5 Units Subcutaneous QHS  . insulin NPH Human  12 Units Subcutaneous BID AC & HS  . [START ON 12/05/2013] meclizine  25 mg Oral Daily  . PARoxetine  40 mg Oral Daily  . [START ON 12/05/2013] potassium chloride  10 mEq Oral Daily  . risperiDONE  2 mg Oral QHS  . rosuvastatin  10 mg Oral q1800  . traZODone  75 mg Oral QHS   Continuous Infusions:  PRN Meds:.acetaminophen, ondansetron (ZOFRAN) IV, ondansetron     Results for orders placed during the hospital encounter of 12/04/13 (from the past 48 hour(s))  CBG MONITORING, ED     Status: Abnormal   Collection Time    12/04/13  1:46 PM      Result Value Ref Range   Glucose-Capillary 118 (*) 70 - 99 mg/dL  CBC WITH DIFFERENTIAL     Status: None   Collection Time    12/04/13  2:00 PM      Result Value Ref Range   WBC 7.1  4.0 - 10.5 K/uL   RBC 4.37  3.87 - 5.11 MIL/uL   Hemoglobin 13.1  12.0 - 15.0 g/dL   HCT 39.1  36.0 - 46.0 %   MCV 89.5  78.0 - 100.0 fL   MCH 30.0  26.0 - 34.0 pg   MCHC 33.5  30.0 - 36.0 g/dL   RDW 12.6  11.5 - 15.5 %   Platelets 262  150 - 400 K/uL   Neutrophils Relative % 45  43 - 77 %   Neutro Abs 3.2  1.7 - 7.7 K/uL   Lymphocytes Relative 41  12 - 46 %   Lymphs Abs 2.9  0.7 - 4.0 K/uL   Monocytes Relative 10  3 - 12 %   Monocytes Absolute 0.7  0.1 - 1.0 K/uL   Eosinophils Relative 3  0 - 5 %   Eosinophils Absolute 0.2  0.0 - 0.7 K/uL   Basophils Relative 1  0 - 1 %   Basophils Absolute 0.0  0.0 - 0.1 K/uL  COMPREHENSIVE METABOLIC PANEL  Status: Abnormal   Collection Time    12/04/13  2:00 PM      Result Value Ref Range   Sodium 139  137 - 147 mEq/L   Potassium 3.4 (*) 3.7 - 5.3 mEq/L   Chloride 102  96 - 112 mEq/L   CO2 27  19 - 32  mEq/L   Glucose, Bld 139 (*) 70 - 99 mg/dL   BUN 5 (*) 6 - 23 mg/dL   Creatinine, Ser 0.81  0.50 - 1.10 mg/dL   Calcium 10.1  8.4 - 10.5 mg/dL   Total Protein 7.5  6.0 - 8.3 g/dL   Albumin 3.8  3.5 - 5.2 g/dL   AST 14  0 - 37 U/L   ALT 9  0 - 35 U/L   Alkaline Phosphatase 74  39 - 117 U/L   Total Bilirubin 0.4  0.3 - 1.2 mg/dL   GFR calc non Af Amer 77 (*) >90 mL/min   GFR calc Af Amer 89 (*) >90 mL/min   Comment: (NOTE)     The eGFR has been calculated using the CKD EPI equation.     This calculation has not been validated in all clinical situations.     eGFR's persistently <90 mL/min signify possible Chronic Kidney     Disease.   Anion gap 10  5 - 15  PROTIME-INR     Status: None   Collection Time    12/04/13  2:00 PM      Result Value Ref Range   Prothrombin Time 13.6  11.6 - 15.2 seconds   INR 1.03  0.00 - 1.49  DIGOXIN LEVEL     Status: Abnormal   Collection Time    12/04/13  2:00 PM      Result Value Ref Range   Digoxin Level 0.7 (*) 0.8 - 2.0 ng/mL  ETHANOL     Status: None   Collection Time    12/04/13  2:00 PM      Result Value Ref Range   Alcohol, Ethyl (B) <11  0 - 11 mg/dL   Comment:            LOWEST DETECTABLE LIMIT FOR     SERUM ALCOHOL IS 11 mg/dL     FOR MEDICAL PURPOSES ONLY  URINE RAPID DRUG SCREEN (HOSP PERFORMED)     Status: Abnormal   Collection Time    12/04/13  3:18 PM      Result Value Ref Range   Opiates NONE DETECTED  NONE DETECTED   Cocaine NONE DETECTED  NONE DETECTED   Benzodiazepines POSITIVE (*) NONE DETECTED   Amphetamines NONE DETECTED  NONE DETECTED   Tetrahydrocannabinol NONE DETECTED  NONE DETECTED   Barbiturates NONE DETECTED  NONE DETECTED   Comment:            DRUG SCREEN FOR MEDICAL PURPOSES     ONLY.  IF CONFIRMATION IS NEEDED     FOR ANY PURPOSE, NOTIFY LAB     WITHIN 5 DAYS.                LOWEST DETECTABLE LIMITS     FOR URINE DRUG SCREEN     Drug Class       Cutoff (ng/mL)     Amphetamine      1000      Barbiturate      200     Benzodiazepine   093     Tricyclics       235  Opiates          300     Cocaine          300     THC              50  URINALYSIS, ROUTINE W REFLEX MICROSCOPIC     Status: Abnormal   Collection Time    12/04/13  3:18 PM      Result Value Ref Range   Color, Urine YELLOW  YELLOW   APPearance CLEAR  CLEAR   Specific Gravity, Urine 1.025  1.005 - 1.030   pH 6.0  5.0 - 8.0   Glucose, UA NEGATIVE  NEGATIVE mg/dL   Hgb urine dipstick SMALL (*) NEGATIVE   Bilirubin Urine NEGATIVE  NEGATIVE   Ketones, ur 15 (*) NEGATIVE mg/dL   Protein, ur NEGATIVE  NEGATIVE mg/dL   Urobilinogen, UA 0.2  0.0 - 1.0 mg/dL   Nitrite NEGATIVE  NEGATIVE   Leukocytes, UA NEGATIVE  NEGATIVE  URINE MICROSCOPIC-ADD ON     Status: Abnormal   Collection Time    12/04/13  3:18 PM      Result Value Ref Range   Squamous Epithelial / LPF FEW (*) RARE   WBC, UA 3-6  <3 WBC/hpf   RBC / HPF 3-6  <3 RBC/hpf   Bacteria, UA FEW (*) RARE    Studies/Results:  BRAIN MRI Stable suboccipital craniectomy. No acute stroke, hemorrhage,  hydrocephalus, or extra-axial fluid. Generalized atrophy with  chronic microvascular ischemic change. Scattered foci of chronic  hemorrhage, predominantly posterior fossa in location, likely  postoperative.  Redemonstrated are abnormal extra-axial foci of enhancement in the  RIGHT cavernous sinus, and LEFT middle cranial fossa anteriorly  unchanged in 2006 in this patient with a diagnosis of Rosai-Dorfman  disease. Both lesions display mild restricted diffusion consistent  with a histoproliferative origin. The more easily measurable lesion,  in the LEFT middle cranial fossa, is unchanged.  Flow voids are maintained. No significant sinus fluid accumulation.  No significant orbital pathology. Partial empty sella.  IMPRESSION:  Unchanged findings of Rosai-Dorfman, an idiopathic  histoproliferative disorder which in this patient involves the RIGHT  cavernous sinus  and LEFT middle cranial fossa.  No evidence for acute stroke or significant change since 2006.    BRAIN MRI 2006 Clinical Data: Rosai-Dorfman Disease. Worsening headache. Diminishing vision along with neck pain.  MRI BRAIN WITHOUT AND WITH CONTRAST:  Technique: Multiplanar and multiecho pulse sequences of the brain and surrounding structures were obtained according to standard protocol before and after administration of intravenous contrast.  Contrast: 20 cc Omniscan.  Comparison: Head CT 04/25/04 and digitized MRI 05/08/00.  Findings: Sagittal images reveal previous posterior fossa craniectomy; this was performed to obtain diagnostic tissue using the diagnosis of Rosai-Dorfman Disease. There is a fairly prominent empty sella with downward displacement of the optic chiasm and stretching of the pituitary stalk. T2-weighted images show mild atrophy. FLAIR images show mild changes of small vessel disease. T1-weighted images unremarkable. Diffusion images are negative for acute stroke.  Post infusion images show two prior areas of abnormal enhancement. On the right, there is enhancing soft tissue in the cavernous sinus surrounding the internal carotid artery. It is possible that this extends anteriorly to involve a portion of the optic nerve as well. This appearance is stable over the three-year interval. A second area of abnormal enhancement measuring 15 x 20 mm is located along the greater sphenoid wing on the left. While superficially this resembles  a meningioma, it is likely to represent a manifestation of Rosai-Dorfman Disease with abnormal cellular accumulation in the meninges. This too is stable over the three-year interval.  IMPRESSION:  1. Stable enhancing skull base lesions involving the right cavernous sinus and left greater sphenoid wing, consistent with Rosai-Dorfman Disease.  2. Stable appearance status post suboccipital craniectomy.  3. Stable empty sella appearance.  4. Mild atrophy and  small vessel disease.   The scan is reviewed in person. There is enhancement involving the right Sinus and the left anterior temporal area. Both of these are extra-axial and diffusely enhancing. There is moderate generalized atrophy. There is mild to periventricular confluent leukoencephalopathy.     [place] A. Merlene Laughter, M.D.  Diplomate, [employer] adviser of Psychiatry and Neurology ( Neurology). 12/04/2013, 7:30 PM

## 2013-12-04 NOTE — H&P (Addendum)
[facility] History and Physical  Rachel [name] WYO:[id number] DOB: [birth year] DOA: 12/04/2013  Referring physician: ER PCP: Maggie Font, MD   Chief Complaint: Slurred speech  HPI: Rachel [name] is a 63 y.o. female  This is a 63 year old lady who has a history of brain surgery several years ago and now presents with 2 day history of slurred speech. The patient herself cannot give me any clear history. Her husband gives me the history. There is no apparent weakness in any limb. She is able to walk without difficulty. Evaluation in the emergency room shows an MRI which is negative for acute pathology but does show findings consistent with Rosai-Dorfman, and idiopathic history of proliferative disorder. This involves the right cavernous sinus and the left middle cranial fossa. She was noted to have facial weakness and now she is being admitted for further investigation.  Review of Systems:  Patient unable to give me any clear history.   Past Medical History  Diagnosis Date  . Diabetes mellitus type I   . Hyperlipidemia   . Vertigo   . GERD (gastroesophageal reflux disease)   . Systemic hypertension   . Paroxysmal atrial fibrillation   . Schizophrenia    Past Surgical History  Procedure Laterality Date  . Brain tumor excision    . US echocardiography  12/20/2003    mild mitral annular ca+,mild MR,TR,PI,AOV mildly sclerotic  . Nm myocar perf wall motion  02/01/2007    no significant ischemia   Social History:  reports that she has never smoked. She has never used smokeless tobacco. She reports that she does not drink alcohol or use illicit drugs.  Allergies  Allergen Reactions  . Sulfa Antibiotics Anaphylaxis  . Ibuprofen Other (See Comments)    unknown    Family History  Problem Relation Age of Onset  . ADD / ADHD Neg Hx   . Alcohol abuse Neg Hx   . Drug abuse Neg Hx   . Anxiety disorder Neg Hx   . Bipolar disorder Neg Hx   . Dementia Neg Hx   .  Depression Neg Hx   . OCD Neg Hx   . Paranoid behavior Neg Hx   . Schizophrenia Neg Hx   . Seizures Neg Hx   . Sexual abuse Neg Hx   . Physical abuse Neg Hx      Prior to Admission medications   Medication Sig Start Date End Date Taking? Authorizing Provider  acetaminophen (TYLENOL) 500 MG tablet Take 1,000 mg by mouth every 6 (six) hours as needed for mild pain.   Yes Historical Provider, MD  ALPRAZolam (XANAX) 0.25 MG tablet Take 1 tablet (0.25 mg total) by mouth 3 (three) times daily. 09/26/13  Yes Levonne Spiller, MD  aspirin 325 MG tablet Take 325 mg by mouth daily.   Yes Historical Provider, MD  digoxin (LANOXIN) 0.125 MG tablet Take 1 tablet (0.125 mg total) by mouth daily. 09/26/13  Yes Mihai Croitoru, MD  diltiazem (CARDIZEM CD) 180 MG 24 hr capsule Take 1 capsule (180 mg total) by mouth daily. 09/29/13  Yes Mihai Croitoru, MD  famotidine (PEPCID) 20 MG tablet Take 20 mg by mouth daily.  01/08/11  Yes Historical Provider, MD  gabapentin (NEURONTIN) 300 MG capsule Take 300 mg by mouth at bedtime.  01/08/11  Yes Historical Provider, MD  KLOR-CON 10 10 MEQ tablet Take 10 mEq by mouth daily.  04/06/11  Yes Historical Provider, MD  meclizine (ANTIVERT) 25 MG tablet Take 25 mg  by mouth daily. 07/23/13  Yes Historical Provider, MD  NOVOLIN N 100 UNIT/ML injection Inject 12 Units into the skin 2 (two) times daily at 8 am and 10 pm.  11/25/10  Yes Historical Provider, MD  PARoxetine (PAXIL) 40 MG tablet Take 1 tablet (40 mg total) by mouth daily. 08/25/13 08/25/14 Yes Levonne Spiller, MD  risperiDONE (RISPERDAL) 2 MG tablet Take 1 tablet (2 mg total) by mouth at bedtime. 09/26/13  Yes Levonne Spiller, MD  rosuvastatin (CRESTOR) 10 MG tablet Take 1 tablet (10 mg total) by mouth daily. 01/16/13  Yes Mihai Croitoru, MD  traZODone (DESYREL) 150 MG tablet Take 0.5 tablets (75 mg total) by mouth at bedtime. 09/26/13  Yes Levonne Spiller, MD   Physical Exam: Filed Vitals:   12/04/13 1357 12/04/13 1457 12/04/13 1700  12/04/13 1800  BP:   142/81 150/82  Pulse:   63 63  Temp: 97.9 F (36.6 C) 97.9 F (36.6 C)    TempSrc: Oral     Resp:   17 17  SpO2:   100% 95%    Wt Readings from Last 3 Encounters:  09/26/13 85.004 kg (187 lb 6.4 oz)  09/11/13 85.548 kg (188 lb 9.6 oz)  08/25/13 84.823 kg (187 lb)    General:  Appears calm and comfortable Eyes: PERRL, normal lids, irises & conjunctiva ENT: grossly normal hearing, lips & tongue Neck: no LAD, masses or thyromegaly Cardiovascular: RRR, no m/r/g. No LE edema. Telemetry: SR, no arrhythmias  Respiratory: CTA bilaterally, no w/r/r. Normal respiratory effort. Abdomen: soft, ntnd Skin: no rash or induration seen on limited exam Musculoskeletal: grossly normal tone BUE/BLE Psychiatric: grossly normal mood and affect, speech fluent and appropriate Neurologic: appears to have a right facial droop, probably lower motor neuron. 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 No apparent limb weakness.           Labs on Admission:  Basic Metabolic Panel:  Recent Labs Lab 12/04/13 1400  NA 139  K 3.4*  CL 102  CO2 27  GLUCOSE 139*  BUN 5*  CREATININE 0.81  CALCIUM 10.1   Liver Function Tests:  Recent Labs Lab 12/04/13 1400  AST 14  ALT 9  ALKPHOS 74  BILITOT 0.4  PROT 7.5  ALBUMIN 3.8   No results found for this basename: LIPASE, AMYLASE,  in the last 168 hours No results found for this basename: AMMONIA,  in the last 168 hours CBC:  Recent Labs Lab 12/04/13 1400  WBC 7.1  NEUTROABS 3.2  HGB 13.1  HCT 39.1  MCV 89.5  PLT 262   Cardiac Enzymes: No results found for this basename: CKTOTAL, CKMB, CKMBINDEX, TROPONINI,  in the last 168 hours  BNP (last 3 results) No results found for this basename: PROBNP,  in the last 8760 hours CBG:  Recent Labs Lab 12/04/13 1346  GLUCAP 118*    Radiological Exams on Admission: Dg Chest 1 View  12/04/2013   CLINICAL DATA:  Decreased level of consciousness and difficulty walking for 2  days.  EXAM: CHEST - 1 VIEW  COMPARISON:  04/25/2004  FINDINGS: The heart is mildly enlarged but stable. There is tortuosity, ectasia and calcification of the thoracic aorta. Mild central vascular congestion without overt pulmonary edema. Low lung volumes with vascular crowding and bibasilar atelectasis. There is progressive elevation of the right hemidiaphragm. The bony thorax is intact.  IMPRESSION: Mild cardiac enlargement and moderate central vascular congestion. No overt pulmonary edema.  Low lung volumes with vascular  crowding and bibasilar atelectasis.   Electronically Signed   By: Kalman Jewels M.D.   On: 12/04/2013 14:29   Ct Head Wo Contrast  12/04/2013   CLINICAL DATA:  Altered mental status, decreased level of consciousness, difficulty walking for 2 days, LEFT and RIGHT side weakness, personal history of diabetes mellitus type 1, systemic hypertension, atrial fibrillation  EXAM: CT HEAD WITHOUT CONTRAST  TECHNIQUE: Contiguous axial images were obtained from the base of the skull through the vertex without intravenous contrast.  COMPARISON:  04/25/2004  FINDINGS: Prior suboccipital craniotomy into foramen magnum.  Generalized atrophy.  Normal ventricular morphology.  No midline shift or mass effect.  Normal appearance of brain parenchyma.  No intracranial hemorrhage, mass lesion, evidence acute infarction or extra-axial fluid collection.  Bones and sinuses otherwise unremarkable.  IMPRESSION: Prior suboccipital craniotomy.  Generalized atrophy.  No acute Intracranial abnormalities.   Electronically Signed   By: Lavonia Dana M.D.   On: 12/04/2013 14:26   Mr Jeri Cos OA Contrast  12/04/2013   CLINICAL DATA:  Altered mental status. Craniotomy for tumor removal. Decreased level of consciousness. Difficulty walking.  EXAM: MRI HEAD WITHOUT AND WITH CONTRAST  TECHNIQUE: Multiplanar, multiecho pulse sequences of the brain and surrounding structures were obtained without and with intravenous contrast.   CONTRAST:  63mL MULTIHANCE GADOBENATE DIMEGLUMINE 529 MG/ML IV SOLN  COMPARISON:  CT head earlier today.  MR head performed 10/27/2004.  FINDINGS: Stable suboccipital craniectomy. No acute stroke, hemorrhage, hydrocephalus, or extra-axial fluid. Generalized atrophy with chronic microvascular ischemic change. Scattered foci of chronic hemorrhage, predominantly posterior fossa in location, likely postoperative.  Redemonstrated are abnormal extra-axial foci of enhancement in the RIGHT cavernous sinus, and LEFT middle cranial fossa anteriorly unchanged in 2006 in this patient with a diagnosis of Rosai-Dorfman disease. Both lesions display mild restricted diffusion consistent with a histoproliferative origin. The more easily measurable lesion, in the LEFT middle cranial fossa, is unchanged.  Flow voids are maintained. No significant sinus fluid accumulation. No significant orbital pathology. Partial empty sella.  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  No evidence for acute stroke or significant change since 2006.   Electronically Signed   By: Rolla Flatten M.D.   On: 12/04/2013 16:47     Assessment/Plan   1. Right facial palsy, appears to be a Bell's palsy. 2. UTI. 3. Previous history of brain tumor excision, details of this are not available. 4. Proximal atrial fibrillation. 5. Hypertension.  Plan: 1. Admit to medical floor. 2. Neurology consultation. 3. Intravenous antibiotics for UTI.  Further recommendations will depend on patient's hospital progress.  Code : Full code.   DVT Prophylaxis: heparin.   Family ComI discussed the plan with the patient's husband at the bedside.   Disposition Plan :home when medically stable.   Time spent 60 minutes.  Doree Albee [facility] Pager [phone].

## 2013-12-04 NOTE — ED Notes (Signed)
Pt remains in MRI 

## 2013-12-05 ENCOUNTER — Encounter: Payer: Self-pay | Admitting: Neurology

## 2013-12-05 ENCOUNTER — Other Ambulatory Visit (HOSPITAL_COMMUNITY): Payer: Self-pay

## 2013-12-05 DIAGNOSIS — I10 Essential (primary) hypertension: Secondary | ICD-10-CM

## 2013-12-05 LAB — VITAMIN B12: VITAMIN B 12: 237 pg/mL (ref 211–911)

## 2013-12-05 LAB — COMPREHENSIVE METABOLIC PANEL
ALK PHOS: 69 U/L (ref 39–117)
ALT: 7 U/L (ref 0–35)
ANION GAP: 10 (ref 5–15)
AST: 14 U/L (ref 0–37)
Albumin: 3.4 g/dL — ABNORMAL LOW (ref 3.5–5.2)
BUN: 5 mg/dL — ABNORMAL LOW (ref 6–23)
CALCIUM: 10.1 mg/dL (ref 8.4–10.5)
CO2: 29 meq/L (ref 19–32)
Chloride: 104 mEq/L (ref 96–112)
Creatinine, Ser: 0.77 mg/dL (ref 0.50–1.10)
GFR calc Af Amer: >90 mL/min (ref >90)
GFR, EST NON AFRICAN AMERICAN: 89 mL/min — AB (ref >90)
Glucose, Bld: 94 mg/dL (ref 70–99)
POTASSIUM: 3.6 meq/L — AB (ref 3.7–5.3)
SODIUM: 143 meq/L (ref 137–147)
Total Bilirubin: 0.5 mg/dL (ref 0.3–1.2)
Total Protein: 7 g/dL (ref 6.0–8.3)

## 2013-12-05 LAB — CK TOTAL AND CKMB (NOT AT ARMC)
CK, MB: 2.1 ng/mL (ref 0.3–4.0)
Relative Index: 2 (ref 0.0–2.5)
Total CK: 103 U/L (ref 7–177)

## 2013-12-05 LAB — RPR

## 2013-12-05 LAB — GLUCOSE, CAPILLARY
Glucose-Capillary: 125 mg/dL — ABNORMAL HIGH (ref 70–99)
Glucose-Capillary: 187 mg/dL — ABNORMAL HIGH (ref 70–99)

## 2013-12-05 LAB — TSH: TSH: 1.51 u[IU]/mL (ref 0.350–4.500)

## 2013-12-05 LAB — HIV ANTIBODY (ROUTINE TESTING W REFLEX): HIV 1&2 Ab, 4th Generation: NONREACTIVE

## 2013-12-05 LAB — HOMOCYSTEINE: HOMOCYSTEINE-NORM: 11.9 umol/L (ref 4.0–15.4)

## 2013-12-05 MED ORDER — CIPROFLOXACIN HCL 500 MG PO TABS: 500.0000 mg | ORAL_TABLET | Freq: Two times a day (BID) | ORAL | Status: DC

## 2013-12-05 NOTE — Progress Notes (Signed)
Patient unable to have EEG due to weave. Nurse aware of weave.

## 2013-12-05 NOTE — Progress Notes (Signed)
UR chart review completed.  

## 2013-12-05 NOTE — Care Management Note (Signed)
    Page 1 of 1   12/05/2013     12:01:31 PM CARE MANAGEMENT NOTE 12/05/2013  Patient:  Rachel [name], Rachel [name]   Account Number:  [id number]  Date Initiated:  12/05/2013  Documentation initiated by:  Theophilus Kinds  Subjective/Objective Assessment:   Pt admitted from home with facial droop, ? CVA. Pt lives with her husband and will return home at discharge. Pt is fairly independent with ADl's.     Action/Plan:   No CM needs noted.   Anticipated DC Date:  12/06/2013   Anticipated DC Plan:  [place]  CM consult      Choice offered to / List presented to:             Status of service:  Completed, signed off Medicare Important Message given?   (If response is "NO", the following Medicare IM given date fields will be blank) Date Medicare IM given:   Medicare IM given by:   Date Additional Medicare IM given:   Additional Medicare IM given by:    Discharge Disposition:  HOME/SELF CARE  Per UR Regulation:    If discussed at Long Length of Stay Meetings, dates discussed:    Comments:  12/05/13 [place], RN BSN CM

## 2013-12-05 NOTE — Discharge Summary (Signed)
Physician Discharge Summary  Rachel [name] YHC:[id number] DOB: [birth year] DOA: 12/04/2013  PCP: Maggie Font, MD  Admit date: 12/04/2013 Discharge date: 12/05/2013  Time spent: 45 minutes  Recommendations for Outpatient Follow-up:  -Will be discharged home today. -Advised to follow-up with primary provider in 2 weeks. -As per neurology recommendations will need to follow-up with her neurosurgeon.   Discharge Diagnoses:  Active Problems:   HTN (hypertension)   Paroxysmal atrial fibrillation   H/O brain tumor   Facial droop   Facial palsy   Discharge Condition: Stable and improved  Filed Weights   12/04/13 1834  Weight: 83.008 kg (183 lb)    History of present illness:  This is a 63 year old lady who has a history of brain surgery several years ago and now presents with 2 day history of slurred speech. The patient herself cannot give me any clear history. Her husband gives me the history. There is no apparent weakness in any limb. She is able to walk without difficulty. Evaluation in the emergency room shows an MRI which is negative for acute pathology but does show findings consistent with Rosai-Dorfman, and idiopathic history of proliferative disorder. This involves the right cavernous sinus and the left middle cranial fossa.  She was noted to have facial weakness and now she is being admitted for further investigation.   Hospital Course:   Bilateral facial paresis worse on the right side -I believe this explains some of the poor verbal response that the patient is exhibiting. Neurology agrees. -Believe this is secondary to underlying brain disorder. -Recommend outpatient follow-up with her neurosurgeon.  Marked psychomotor slowing  -believe secondary to long-term use of neuroleptic for her schizophrenia. Her Risperdal dose as been decreased to 0.5 mg.  Patient's husband confirms that all of these findings with the exception of the slurred speech are chronic  in nature and have been present for over 15 years.   Procedures:  None   Consultations:  Neurology, Dr. Merlene Laughter  Discharge Instructions  Discharge Instructions   Increase activity slowly    Complete by:  As directed             Medication List         acetaminophen 500 MG tablet  Commonly known as:  TYLENOL  Take 1,000 mg by mouth every 6 (six) hours as needed for mild pain.     ALPRAZolam 0.25 MG tablet  Commonly known as:  XANAX  Take 1 tablet (0.25 mg total) by mouth 3 (three) times daily.     aspirin 325 MG tablet  Take 325 mg by mouth daily.     ciprofloxacin 500 MG tablet  Commonly known as:  CIPRO  Take 1 tablet (500 mg total) by mouth 2 (two) times daily.     digoxin 0.125 MG tablet  Commonly known as:  LANOXIN  Take 1 tablet (0.125 mg total) by mouth daily.     diltiazem 180 MG 24 hr capsule  Commonly known as:  CARDIZEM CD  Take 1 capsule (180 mg total) by mouth daily.     famotidine 20 MG tablet  Commonly known as:  PEPCID  Take 20 mg by mouth daily.     gabapentin 300 MG capsule  Commonly known as:  NEURONTIN  Take 300 mg by mouth at bedtime.     KLOR-CON 10 10 MEQ tablet  Generic drug:  potassium chloride  Take 10 mEq by mouth daily.     meclizine 25 MG tablet  Commonly known as:  ANTIVERT  Take 25 mg by mouth daily.     NOVOLIN N 100 UNIT/ML injection  Generic drug:  insulin NPH Human  Inject 12 Units into the skin 2 (two) times daily at 8 am and 10 pm.     PARoxetine 40 MG tablet  Commonly known as:  PAXIL  Take 1 tablet (40 mg total) by mouth daily.     risperiDONE 2 MG tablet  Commonly known as:  RISPERDAL  Take 1 tablet (2 mg total) by mouth at bedtime.     rosuvastatin 10 MG tablet  Commonly known as:  CRESTOR  Take 1 tablet (10 mg total) by mouth daily.     traZODone 150 MG tablet  Commonly known as:  DESYREL  Take 0.5 tablets (75 mg total) by mouth at bedtime.       Allergies  Allergen Reactions  . Sulfa  Antibiotics Anaphylaxis  . Ibuprofen Other (See Comments)    unknown       Follow-up Information   Follow up with HILL,GERALD K, MD. Schedule an appointment as soon as possible for a visit in 2 weeks.   Specialty:  Family Medicine   Contact information:   [address] [place] [address] [phone]        The results of significant diagnostics from this hospitalization (including imaging, microbiology, ancillary and laboratory) are listed below for reference.    Significant Diagnostic Studies: Dg Chest 1 View  12/04/2013   CLINICAL DATA:  Decreased level of consciousness and difficulty walking for 2 days.  EXAM: CHEST - 1 VIEW  COMPARISON:  04/25/2004  FINDINGS: The heart is mildly enlarged but stable. There is tortuosity, ectasia and calcification of the thoracic aorta. Mild central vascular congestion without overt pulmonary edema. Low lung volumes with vascular crowding and bibasilar atelectasis. There is progressive elevation of the right hemidiaphragm. The bony thorax is intact.  IMPRESSION: Mild cardiac enlargement and moderate central vascular congestion. No overt pulmonary edema.  Low lung volumes with vascular crowding and bibasilar atelectasis.   Electronically Signed   By: Kalman Jewels M.D.   On: 12/04/2013 14:29   Ct Head Wo Contrast  12/04/2013   CLINICAL DATA:  Altered mental status, decreased level of consciousness, difficulty walking for 2 days, LEFT and RIGHT side weakness, personal history of diabetes mellitus type 1, systemic hypertension, atrial fibrillation  EXAM: CT HEAD WITHOUT CONTRAST  TECHNIQUE: Contiguous axial images were obtained from the base of the skull through the vertex without intravenous contrast.  COMPARISON:  04/25/2004  FINDINGS: Prior suboccipital craniotomy into foramen magnum.  Generalized atrophy.  Normal ventricular morphology.  No midline shift or mass effect.  Normal appearance of brain parenchyma.  No intracranial hemorrhage, mass  lesion, evidence acute infarction or extra-axial fluid collection.  Bones and sinuses otherwise unremarkable.  IMPRESSION: Prior suboccipital craniotomy.  Generalized atrophy.  No acute Intracranial abnormalities.   Electronically Signed   By: Lavonia Dana M.D.   On: 12/04/2013 14:26   Mr Jeri Cos LM Contrast  12/04/2013   CLINICAL DATA:  Altered mental status. Craniotomy for tumor removal. Decreased level of consciousness. Difficulty walking.  EXAM: MRI HEAD WITHOUT AND WITH CONTRAST  TECHNIQUE: Multiplanar, multiecho pulse sequences of the brain and surrounding structures were obtained without and with intravenous contrast.  CONTRAST:  57mL MULTIHANCE GADOBENATE DIMEGLUMINE 529 MG/ML IV SOLN  COMPARISON:  CT head earlier today.  MR head performed 10/27/2004.  FINDINGS: Stable suboccipital  craniectomy. No acute stroke, hemorrhage, hydrocephalus, or extra-axial fluid. Generalized atrophy with chronic microvascular ischemic change. Scattered foci of chronic hemorrhage, predominantly posterior fossa in location, likely postoperative.  Redemonstrated are abnormal extra-axial foci of enhancement in the RIGHT cavernous sinus, and LEFT middle cranial fossa anteriorly unchanged in 2006 in this patient with a diagnosis of Rosai-Dorfman disease. Both lesions display mild restricted diffusion consistent with a histoproliferative origin. The more easily measurable lesion, in the LEFT middle cranial fossa, is unchanged.  Flow voids are maintained. No significant sinus fluid accumulation. No significant orbital pathology. Partial empty sella.  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  No evidence for acute stroke or significant change since 2006.   Electronically Signed   By: Rolla Flatten M.D.   On: 12/04/2013 16:47    Microbiology: No results found for this or any previous visit (from  the past 240 hour(s)).   Labs: Basic Metabolic Panel:  Recent Labs Lab 12/04/13 1400 12/05/13 0529  NA 139 143  K 3.4* 3.6*  CL 102 104  CO2 27 29  GLUCOSE 139* 94  BUN 5* 5*  CREATININE 0.81 0.77  CALCIUM 10.1 10.1   Liver Function Tests:  Recent Labs Lab 12/04/13 1400 12/05/13 0529  AST 14 14  ALT 9 7  ALKPHOS 74 69  BILITOT 0.4 0.5  PROT 7.5 7.0  ALBUMIN 3.8 3.4*   No results found for this basename: LIPASE, AMYLASE,  in the last 168 hours No results found for this basename: AMMONIA,  in the last 168 hours CBC:  Recent Labs Lab 12/04/13 1400  WBC 7.1  NEUTROABS 3.2  HGB 13.1  HCT 39.1  MCV 89.5  PLT 262   Cardiac Enzymes: No results found for this basename: CKTOTAL, CKMB, CKMBINDEX, TROPONINI,  in the last 168 hours BNP: BNP (last 3 results) No results found for this basename: PROBNP,  in the last 8760 hours CBG:  Recent Labs Lab 12/04/13 1346 12/04/13 2056 12/05/13 0723 12/05/13 1127  GLUCAP 118* 208* 125* 187*       Signed:  HERNANDEZ ACOSTA,ESTELA  [facility] Pager: [phone] 12/05/2013, 1:43 PM

## 2013-12-06 LAB — URINE CULTURE
Colony Count: NO GROWTH
Culture: NO GROWTH

## 2013-12-08 ENCOUNTER — Other Ambulatory Visit: Payer: Self-pay | Admitting: Cardiovascular Disease

## 2013-12-11 NOTE — Telephone Encounter (Signed)
Rx was sent to pharmacy electronically. 

## 2013-12-26 ENCOUNTER — Encounter (HOSPITAL_COMMUNITY): Payer: Self-pay | Admitting: Psychiatry

## 2013-12-26 ENCOUNTER — Ambulatory Visit (INDEPENDENT_AMBULATORY_CARE_PROVIDER_SITE_OTHER): Payer: BC Managed Care – PPO | Admitting: Psychiatry

## 2013-12-26 VITALS — BP 141/88 | HR 90 | Ht 69.0 in | Wt 189.0 lb

## 2013-12-26 DIAGNOSIS — F200 Paranoid schizophrenia: Secondary | ICD-10-CM

## 2013-12-26 DIAGNOSIS — F209 Schizophrenia, unspecified: Secondary | ICD-10-CM

## 2013-12-26 MED ORDER — PAROXETINE HCL 40 MG PO TABS: 40.0000 mg | ORAL_TABLET | Freq: Every day | ORAL | Status: DC

## 2013-12-26 MED ORDER — TRAZODONE HCL 150 MG PO TABS: 75.0000 mg | ORAL_TABLET | Freq: Every day | ORAL | Status: DC

## 2013-12-26 MED ORDER — ALPRAZOLAM 0.25 MG PO TABS
0.2500 mg | ORAL_TABLET | Freq: Three times a day (TID) | ORAL | Status: DC
Start: 2013-12-26 — End: 2014-03-29

## 2013-12-26 MED ORDER — RISPERIDONE 2 MG PO TABS: 2.0000 mg | ORAL_TABLET | Freq: Every day | ORAL | Status: DC

## 2013-12-26 NOTE — Progress Notes (Signed)
Patient ID: Rachel [name], female   DOB: 05/28/1950, 63 y.o.   MRN: [id number] Patient ID: Rachel [name], female   DOB: 12-03-1950, 63 y.o.   MRN: [id number] Patient ID: Rachel [name], female   DOB: August 16, 1950, 63 y.o.   MRN: [id number] Patient ID: Rachel [name], female   DOB: December 11, 1950, 63 y.o.   MRN: [id number] Patient ID: Rachel [name], female   DOB: 04-05-1950, 63 y.o.   MRN: [id number] Patient ID: Rachel [name], female   DOB: 1950/03/09, 63 y.o.   MRN: [id number] Patient ID: Rachel [name], female   DOB: 1950-11-03, 63 y.o.   MRN: [id number] Patient ID: Rachel [name], female   DOB: 10/08/1950, 63 y.o.   MRN: [id number] [facility] [id number] Progress Note Rachel [name] MRN: [id number] DOB: 03/06/1950 Age: 63 y.o.  Date: 12/26/2013 Start Time: 1:18 PM End Time: 1:53 PM  Chief Complaint: Chief Complaint  Patient presents with  . Schizophrenia  . Anxiety  . Follow-up   Subjective: "She is doing better This patient is a 63 year old married black female lives with her husband in [place]. She has not worked in years but is not on disability. She is a very poor historian   Apparently she has had mental illness for many years. At one point she was depressed but also had psychotic symptoms that sound congruent with schizophrenia. Her husband thinks she is doing pretty well on her current medications but she tends to sleep too much during the day. We've looked at her med list and found that her trazodone might be too high. She does better if she only takes 100 mg instead of 200 mg. She also takes meclizine in the morning which may be contributing to drowsiness. Neither one of them remember why she is on lthis.  The patient denies being depressed but her energy is poor and she is drowsy. She doesn't sleep well she'll start to have visual hallucinations at night. This happens if she sleeps throughout the whole day. She denies auditory  hallucinations or paranoia.  The patient returns after 3 months. She was hospitalized at the end of last month because of altered mental status. She did have a neurology consult. She had a urinary tract infection and this may have been the culprit. She does have [place] there is no acute change in her brain scan. The neurologist suggested a decrease in her respiratory but this was not done at the time of discharge. She's still somewhat delusional and thinks that someone at the hospital tried to hurt her. She points to the mark left by the IV insertion to prove this. She's no longer talking about her husband leaving her. She is sleeping well. Her husband thinks she is back to her baseline.  Current psychiatric medication paxil 40 mg daily Trazodone 75 mg at bedtime Xanax 0.25 mg 3 times a day Risperdal 2 mg at bedtime     Past psychiatric history Patient has been seeing in this office since 1998.  She was admitted at [facility] due to severe depression and psychotic features.  At that time she was having suicidal thoughts .  Since then she has been stable on current psychiatric medication. Allergies: Allergies  Allergen Reactions  . Sulfa Antibiotics Anaphylaxis  . Ibuprofen Other (See Comments)    unknown  Medical History: Past Medical History  Diagnosis Date  . Diabetes mellitus type I   . Hyperlipidemia   . Vertigo   .  GERD (gastroesophageal reflux disease)   . Systemic hypertension   . Paroxysmal atrial fibrillation   . Schizophrenia   Patient has history of vertigo, GERD, hyperlipidemia, diabetes mellitus.  Patient is also legally blind.  She see Dr. Karren Burly for her annual checkup and will follow up for elevated Hemo A1c with him. Surgical History: Past Surgical History  Procedure Laterality Date  . Brain tumor excision    . US echocardiography  12/20/2003    mild mitral annular ca+,mild MR,TR,PI,AOV mildly sclerotic  . Nm myocar perf wall motion   02/01/2007    no significant ischemia  Reviewed during this appointment. Vitals: BP 141/88 mmHg  Pulse 90  Ht 5\' 9"  (1.753 m)  Wt 189 lb (85.73 kg)  BMI 27.90 kg/m2 Wt down almost 3 pounds.  Mental status examination Patient is casually dressed and fairly groomed. Patient is legally blind and need her husband`s  assistance for walking. She described her mood as ok.  She is calm and not at all fidgety today. Her affect is blunted. Her speech is sparse and she answers in monosyllables She denies any active or passive suicidal thinking and homicidal thinking. Her thought process is significant for delusional thinking particularly stating that someone named Rachel [name] came into her hospital room and took down all her personal information. She is very blunted . She denies any auditory or visual hallucination.  She has poverty of thought content.  She's alert and oriented x3 her insight judgment and impulse control is okay. There no tremors shakes or extrapyramidal side effects noted.  Lab Results:  Results for orders placed or performed during the hospital encounter of 12/04/13 (from the past 8736 hour(s))  CBG monitoring, ED   Collection Time: 12/04/13  1:46 PM  Result Value Ref Range   Glucose-Capillary 118 (H) 70 - 99 mg/dL  CBC with Differential   Collection Time: 12/04/13  2:00 PM  Result Value Ref Range   WBC 7.1 4.0 - 10.5 K/uL   RBC 4.37 3.87 - 5.11 MIL/uL   Hemoglobin 13.1 12.0 - 15.0 g/dL   HCT 39.1 36.0 - 46.0 %   MCV 89.5 78.0 - 100.0 fL   MCH 30.0 26.0 - 34.0 pg   MCHC 33.5 30.0 - 36.0 g/dL   RDW 12.6 11.5 - 15.5 %   Platelets 262 150 - 400 K/uL   Neutrophils Relative % 45 43 - 77 %   Neutro Abs 3.2 1.7 - 7.7 K/uL   Lymphocytes Relative 41 12 - 46 %   Lymphs Abs 2.9 0.7 - 4.0 K/uL   Monocytes Relative 10 3 - 12 %   Monocytes Absolute 0.7 0.1 - 1.0 K/uL   Eosinophils Relative 3 0 - 5 %   Eosinophils Absolute 0.2 0.0 - 0.7 K/uL   Basophils Relative 1 0 - 1 %   Basophils  Absolute 0.0 0.0 - 0.1 K/uL  Comprehensive metabolic panel   Collection Time: 12/04/13  2:00 PM  Result Value Ref Range   Sodium 139 137 - 147 mEq/L   Potassium 3.4 (L) 3.7 - 5.3 mEq/L   Chloride 102 96 - 112 mEq/L   CO2 27 19 - 32 mEq/L   Glucose, Bld 139 (H) 70 - 99 mg/dL   BUN 5 (L) 6 - 23 mg/dL   Creatinine, Ser 0.81 0.50 - 1.10 mg/dL   Calcium 10.1 8.4 - 10.5 mg/dL   Total Protein 7.5 6.0 - 8.3 g/dL   Albumin 3.8 3.5 - 5.2 g/dL  AST 14 0 - 37 U/L   ALT 9 0 - 35 U/L   Alkaline Phosphatase 74 39 - 117 U/L   Total Bilirubin 0.4 0.3 - 1.2 mg/dL   GFR calc non Af Amer 77 (L) >90 mL/min   GFR calc Af Amer 89 (L) >90 mL/min   Anion gap 10 5 - 15  Protime-INR   Collection Time: 12/04/13  2:00 PM  Result Value Ref Range   Prothrombin Time 13.6 11.6 - 15.2 seconds   INR 1.03 0.00 - 1.49  Digoxin level   Collection Time: 12/04/13  2:00 PM  Result Value Ref Range   Digoxin Level 0.7 (L) 0.8 - 2.0 ng/mL  Ethanol   Collection Time: 12/04/13  2:00 PM  Result Value Ref Range   Alcohol, Ethyl (B) <11 0 - 11 mg/dL  TSH   Collection Time: 12/04/13  2:00 PM  Result Value Ref Range   TSH 1.510 0.350 - 4.500 uIU/mL  Urine culture   Collection Time: 12/04/13  3:18 PM  Result Value Ref Range   Specimen Description URINE, CLEAN CATCH    Special Requests NONE    Culture  Setup Time      12/05/2013 19:38 Performed at [place] Performed at [facility]    Culture NO GROWTH Performed at [facility]    Report Status 12/06/2013 FINAL   Urine Drug Screen   Collection Time: 12/04/13  3:18 PM  Result Value Ref Range   Opiates NONE DETECTED NONE DETECTED   Cocaine NONE DETECTED NONE DETECTED   Benzodiazepines POSITIVE (A) NONE DETECTED   Amphetamines NONE DETECTED NONE DETECTED   Tetrahydrocannabinol NONE DETECTED NONE DETECTED   Barbiturates NONE DETECTED NONE DETECTED  Urinalysis, Routine w reflex microscopic   Collection Time:  12/04/13  3:18 PM  Result Value Ref Range   Color, Urine YELLOW YELLOW   APPearance CLEAR CLEAR   Specific Gravity, Urine 1.025 1.005 - 1.030   pH 6.0 5.0 - 8.0   Glucose, UA NEGATIVE NEGATIVE mg/dL   Hgb urine dipstick SMALL (A) NEGATIVE   Bilirubin Urine NEGATIVE NEGATIVE   Ketones, ur 15 (A) NEGATIVE mg/dL   Protein, ur NEGATIVE NEGATIVE mg/dL   Urobilinogen, UA 0.2 0.0 - 1.0 mg/dL   Nitrite NEGATIVE NEGATIVE   Leukocytes, UA NEGATIVE NEGATIVE  Urine microscopic-add on   Collection Time: 12/04/13  3:18 PM  Result Value Ref Range   Squamous Epithelial / LPF FEW (A) RARE   WBC, UA 3-6 <3 WBC/hpf   RBC / HPF 3-6 <3 RBC/hpf   Bacteria, UA FEW (A) RARE  Glucose, capillary   Collection Time: 12/04/13  8:56 PM  Result Value Ref Range   Glucose-Capillary 208 (H) 70 - 99 mg/dL   Comment 1 Notify RN   Comprehensive metabolic panel   Collection Time: 12/05/13  5:29 AM  Result Value Ref Range   Sodium 143 137 - 147 mEq/L   Potassium 3.6 (L) 3.7 - 5.3 mEq/L   Chloride 104 96 - 112 mEq/L   CO2 29 19 - 32 mEq/L   Glucose, Bld 94 70 - 99 mg/dL   BUN 5 (L) 6 - 23 mg/dL   Creatinine, Ser 0.77 0.50 - 1.10 mg/dL   Calcium 10.1 8.4 - 10.5 mg/dL   Total Protein 7.0 6.0 - 8.3 g/dL   Albumin 3.4 (L) 3.5 - 5.2 g/dL   AST 14 0 - 37 U/L   ALT  7 0 - 35 U/L   Alkaline Phosphatase 69 39 - 117 U/L   Total Bilirubin 0.5 0.3 - 1.2 mg/dL   GFR calc non Af Amer 89 (L) >90 mL/min   GFR calc Af Amer >90 >90 mL/min   Anion gap 10 5 - 15  Vitamin B12   Collection Time: 12/05/13  5:29 AM  Result Value Ref Range   Vitamin B-12 237 211 - 911 pg/mL  RPR   Collection Time: 12/05/13  5:29 AM  Result Value Ref Range   RPR NON REAC NON REAC  Homocysteine   Collection Time: 12/05/13  5:29 AM  Result Value Ref Range   Homocysteine 11.9 4.0 - 15.4 umol/L  CK total and CKMB (cardiac)   Collection Time: 12/05/13  5:29 AM  Result Value Ref Range   Total CK 103 7 - 177 U/L   CK, MB 2.1 0.3 - 4.0 ng/mL    Relative Index 2.0 0.0 - 2.5  HIV antibody   Collection Time: 12/05/13  5:29 AM  Result Value Ref Range   HIV 1&2 Ab, 4th Generation NONREACTIVE NONREACTIVE  Glucose, capillary   Collection Time: 12/05/13  7:23 AM  Result Value Ref Range   Glucose-Capillary 125 (H) 70 - 99 mg/dL  Glucose, capillary   Collection Time: 12/05/13 11:27 AM  Result Value Ref Range   Glucose-Capillary 187 (H) 70 - 99 mg/dL  Results for orders placed or performed in visit on 09/11/13 (from the past 8736 hour(s))  Lipid panel   Collection Time: 09/11/13 11:01 AM  Result Value Ref Range   Cholesterol 127 0 - 200 mg/dL   Triglycerides 45 <150 mg/dL   HDL 59 >39 mg/dL   Total CHOL/HDL Ratio 2.2 Ratio   VLDL 9 0 - 40 mg/dL   LDL Cholesterol 59 0 - 99 mg/dL  Comprehensive metabolic panel   Collection Time: 09/11/13 11:01 AM  Result Value Ref Range   Sodium 137 135 - 145 mEq/L   Potassium 4.0 3.5 - 5.3 mEq/L   Chloride 102 96 - 112 mEq/L   CO2 29 19 - 32 mEq/L   Glucose, Bld 123 (H) 70 - 99 mg/dL   BUN 7 6 - 23 mg/dL   Creat 0.81 0.50 - 1.10 mg/dL   Total Bilirubin 0.5 0.2 - 1.2 mg/dL   Alkaline Phosphatase 63 39 - 117 U/L   AST 13 0 - 37 U/L   ALT 8 0 - 35 U/L   Total Protein 6.7 6.0 - 8.3 g/dL   Albumin 3.9 3.5 - 5.2 g/dL   Calcium 9.9 8.4 - 10.5 mg/dL   Assessment Axis Ischizophrenia Axis II deferred Axis III see medical history Axis IV mild to moderate  Axis V 60 Plan: I took her vitals.  I reviewed CC, tobacco/med/surg Hx, meds effects/ side effects, problem list, therapies and responses as well as current situation/symptoms discussed options. She'll continue Paxil at 40 mg each bedtime.  trazodone at 75 mg each bedtime and continue Risperdal 2 mg each bedtime.I don't think that her planting is from the respite all she was like this even on a lower dose and her delusional thinking got worse. She'll continue Xanax as needed and return to see me in 3 months Her husband will call me if her  situation worsens See orders and pt instructions for more details.  MEDICATIONS this encounter: Meds ordered this encounter  Medications  . traZODone (DESYREL) 150 MG tablet    Sig: Take 0.5 tablets (  75 mg total) by mouth at bedtime.    Dispense:  45 tablet    Refill:  2  . risperiDONE (RISPERDAL) 2 MG tablet    Sig: Take 1 tablet (2 mg total) by mouth at bedtime.    Dispense:  90 tablet    Refill:  2  . PARoxetine (PAXIL) 40 MG tablet    Sig: Take 1 tablet (40 mg total) by mouth daily.    Dispense:  90 tablet    Refill:  2  . ALPRAZolam (XANAX) 0.25 MG tablet    Sig: Take 1 tablet (0.25 mg total) by mouth 3 (three) times daily.    Dispense:  270 tablet    Refill:  1    Medical Decision Making Problem Points:  Established problem, stable/improving (1), Established problem, worsening (2), Review of last therapy session (1) and Review of psycho-social stressors (1) Data Points:  Review or order medicine tests (1) Review of medication regiment & side effects (2) Review of new medications or change in dosage (2)  I certify that outpatient services furnished can reasonably be expected to improve the patient's condition.   Levonne Spiller, MD

## 2014-01-03 ENCOUNTER — Other Ambulatory Visit: Payer: Self-pay

## 2014-01-03 MED ORDER — ROSUVASTATIN CALCIUM 10 MG PO TABS: 10.0000 mg | ORAL_TABLET | Freq: Every day | ORAL | Status: DC

## 2014-01-03 NOTE — Telephone Encounter (Signed)
Rx sent to pharmacy   

## 2014-03-28 ENCOUNTER — Ambulatory Visit (HOSPITAL_COMMUNITY): Payer: Self-pay | Admitting: Psychiatry

## 2014-03-29 ENCOUNTER — Ambulatory Visit (INDEPENDENT_AMBULATORY_CARE_PROVIDER_SITE_OTHER): Payer: BLUE CROSS/BLUE SHIELD | Admitting: Psychiatry

## 2014-03-29 ENCOUNTER — Encounter (HOSPITAL_COMMUNITY): Payer: Self-pay | Admitting: Psychiatry

## 2014-03-29 VITALS — BP 138/79 | HR 95 | Ht 69.0 in | Wt 187.0 lb

## 2014-03-29 DIAGNOSIS — F200 Paranoid schizophrenia: Secondary | ICD-10-CM | POA: Diagnosis not present

## 2014-03-29 MED ORDER — PAROXETINE HCL 40 MG PO TABS: 40.0000 mg | ORAL_TABLET | Freq: Every day | ORAL | Status: DC

## 2014-03-29 MED ORDER — TRAZODONE HCL 150 MG PO TABS: 75.0000 mg | ORAL_TABLET | Freq: Every day | ORAL | Status: DC

## 2014-03-29 MED ORDER — ALPRAZOLAM 0.25 MG PO TABS: 0.2500 mg | ORAL_TABLET | Freq: Three times a day (TID) | ORAL | Status: DC

## 2014-03-29 MED ORDER — RISPERIDONE 2 MG PO TABS: 2.0000 mg | ORAL_TABLET | Freq: Every day | ORAL | Status: DC

## 2014-03-29 NOTE — Progress Notes (Signed)
Patient ID: AMBREEN TUFTE, female   DOB: [birth year], 64 y.o.   MRN: [id number] Patient ID: CURTIS URIARTE, female   DOB: 03-01-1950, 64 y.o.   MRN: [id number] Patient ID: CHASELYNN KEPPLE, female   DOB: Dec 19, 1950, 64 y.o.   MRN: [id number] Patient ID: DRUSILLA WAMPOLE, female   DOB: February 11, 1950, 64 y.o.   MRN: [id number] Patient ID: SAHARAH SHERROW, female   DOB: Jun 24, 1950, 64 y.o.   MRN: [id number] Patient ID: MIKAELAH TROSTLE, female   DOB: 22-Nov-1950, 64 y.o.   MRN: [id number] Patient ID: Cuthbert COHICK, female   DOB: 1950-06-26, 64 y.o.   MRN: [id number] Patient ID: MONESHA MONREAL, female   DOB: 01-Sep-1950, 64 y.o.   MRN: [id number] Patient ID: LATRONDA SPINK, female   DOB: [birth year], 64 y.o.   MRN: [id number] [facility] [id number] Progress Note LYNETTE TOPETE MRN: [id number] DOB: 25-Oct-1950 Age: 64 y.o.  Date: 03/29/2014 Start Time: 1:18 PM End Time: 1:53 PM  Chief Complaint: Chief Complaint  Patient presents with  . Schizophrenia  . Follow-up   Subjective: "She is doing better This patient is a 64 year old married black female lives with her husband in [place]. She has not worked in years but is not on disability. She is a very poor historian   Apparently she has had mental illness for many years. At one point she was depressed but also had psychotic symptoms that sound congruent with schizophrenia. Her husband thinks she is doing pretty well on her current medications but she tends to sleep too much during the day. We've looked at her med list and found that her trazodone might be too high. She does better if she only takes 100 mg instead of 200 mg. She also takes meclizine in the morning which may be contributing to drowsiness. Neither one of them remember why she is on lthis.  The patient denies being depressed but her energy is poor and she is drowsy. She doesn't sleep well she'll start to have visual hallucinations at night. This  happens if she sleeps throughout the whole day. She denies auditory hallucinations or paranoia.  The patient returns after 3 months with her husband. She is wearing dark glasses and says very little. According to husband, her mood is stable and she is sleeping well. She has not made any paranoid statements and denies auditory hallucinations/ No twitches or jerks  Current psychiatric medication paxil 40 mg daily Trazodone 75 mg at bedtime Xanax 0.25 mg 3 times a day Risperdal 2 mg at bedtime     Past psychiatric history Patient has been seeing in this office since 1998.  She was admitted at [facility] due to severe depression and psychotic features.  At that time she was having suicidal thoughts .  Since then she has been stable on current psychiatric medication. Allergies: Allergies  Allergen Reactions  . Sulfa Antibiotics Anaphylaxis  . Ibuprofen Other (See Comments)    unknown  Medical History: Past Medical History  Diagnosis Date  . Diabetes mellitus type I   . Hyperlipidemia   . Vertigo   . GERD (gastroesophageal reflux disease)   . Systemic hypertension   . Paroxysmal atrial fibrillation   . Schizophrenia   . Blindness   Patient has history of vertigo, GERD, hyperlipidemia, diabetes mellitus.  Patient is also legally blind.  She see Dr. Karren Burly for her annual checkup and will follow up for elevated Hemo A1c with him. Surgical History: Past Surgical History  Procedure Laterality Date  . Brain tumor excision    . US echocardiography  12/20/2003    mild mitral annular ca+,mild MR,TR,PI,AOV mildly sclerotic  . Nm myocar perf wall motion  02/01/2007    no significant ischemia  Reviewed during this appointment. Vitals: BP 138/79 mmHg  Pulse 95  Ht 5\' 9"  (1.753 m)  Wt 187 lb (84.823 kg)  BMI 27.60 kg/m2 Wt down almost 3 pounds.  Mental status examination Patient is casually dressed and fairly groomed. Patient is legally blind and need her husband`s  assistance  for walking. She described her mood as ok.  She is calm and not at all fidgety today. Her affect is blunted. Her speech is sparse and she answers in monosyllables She denies any active or passive suicidal thinking and homicidal thinking. Her thought process is  Negative for paranoid statementsShe is very blunted . She denies any auditory or visual hallucination.  She has poverty of thought content.  She's alert and oriented x3 her insight judgment and impulse control is okay. There no tremors shakes or extrapyramidal side effects noted.  Lab Results:  Results for orders placed or performed during the hospital encounter of 12/04/13 (from the past 8736 hour(s))  CBG monitoring, ED   Collection Time: 12/04/13  1:46 PM  Result Value Ref Range   Glucose-Capillary 118 (H) 70 - 99 mg/dL  CBC with Differential   Collection Time: 12/04/13  2:00 PM  Result Value Ref Range   WBC 7.1 4.0 - 10.5 K/uL   RBC 4.37 3.87 - 5.11 MIL/uL   Hemoglobin 13.1 12.0 - 15.0 g/dL   HCT 39.1 36.0 - 46.0 %   MCV 89.5 78.0 - 100.0 fL   MCH 30.0 26.0 - 34.0 pg   MCHC 33.5 30.0 - 36.0 g/dL   RDW 12.6 11.5 - 15.5 %   Platelets 262 150 - 400 K/uL   Neutrophils Relative % 45 43 - 77 %   Neutro Abs 3.2 1.7 - 7.7 K/uL   Lymphocytes Relative 41 12 - 46 %   Lymphs Abs 2.9 0.7 - 4.0 K/uL   Monocytes Relative 10 3 - 12 %   Monocytes Absolute 0.7 0.1 - 1.0 K/uL   Eosinophils Relative 3 0 - 5 %   Eosinophils Absolute 0.2 0.0 - 0.7 K/uL   Basophils Relative 1 0 - 1 %   Basophils Absolute 0.0 0.0 - 0.1 K/uL  Comprehensive metabolic panel   Collection Time: 12/04/13  2:00 PM  Result Value Ref Range   Sodium 139 137 - 147 mEq/L   Potassium 3.4 (L) 3.7 - 5.3 mEq/L   Chloride 102 96 - 112 mEq/L   CO2 27 19 - 32 mEq/L   Glucose, Bld 139 (H) 70 - 99 mg/dL   BUN 5 (L) 6 - 23 mg/dL   Creatinine, Ser 0.81 0.50 - 1.10 mg/dL   Calcium 10.1 8.4 - 10.5 mg/dL   Total Protein 7.5 6.0 - 8.3 g/dL   Albumin 3.8 3.5 - 5.2 g/dL   AST 14 0  - 37 U/L   ALT 9 0 - 35 U/L   Alkaline Phosphatase 74 39 - 117 U/L   Total Bilirubin 0.4 0.3 - 1.2 mg/dL   GFR calc non Af Amer 77 (L) >90 mL/min   GFR calc Af Amer 89 (L) >90 mL/min   Anion gap 10 5 - 15  Protime-INR   Collection Time: 12/04/13  2:00 PM  Result Value Ref Range   Prothrombin  Time 13.6 11.6 - 15.2 seconds   INR 1.03 0.00 - 1.49  Digoxin level   Collection Time: 12/04/13  2:00 PM  Result Value Ref Range   Digoxin Level 0.7 (L) 0.8 - 2.0 ng/mL  Ethanol   Collection Time: 12/04/13  2:00 PM  Result Value Ref Range   Alcohol, Ethyl (B) <11 0 - 11 mg/dL  TSH   Collection Time: 12/04/13  2:00 PM  Result Value Ref Range   TSH 1.510 0.350 - 4.500 uIU/mL  Urine culture   Collection Time: 12/04/13  3:18 PM  Result Value Ref Range   Specimen Description URINE, CLEAN CATCH    Special Requests NONE    Culture  Setup Time      12/05/2013 19:38 Performed at [place] Performed at [facility]    Culture NO GROWTH Performed at [facility]    Report Status 12/06/2013 FINAL   Urine Drug Screen   Collection Time: 12/04/13  3:18 PM  Result Value Ref Range   Opiates NONE DETECTED NONE DETECTED   Cocaine NONE DETECTED NONE DETECTED   Benzodiazepines POSITIVE (A) NONE DETECTED   Amphetamines NONE DETECTED NONE DETECTED   Tetrahydrocannabinol NONE DETECTED NONE DETECTED   Barbiturates NONE DETECTED NONE DETECTED  Urinalysis, Routine w reflex microscopic   Collection Time: 12/04/13  3:18 PM  Result Value Ref Range   Color, Urine YELLOW YELLOW   APPearance CLEAR CLEAR   Specific Gravity, Urine 1.025 1.005 - 1.030   pH 6.0 5.0 - 8.0   Glucose, UA NEGATIVE NEGATIVE mg/dL   Hgb urine dipstick SMALL (A) NEGATIVE   Bilirubin Urine NEGATIVE NEGATIVE   Ketones, ur 15 (A) NEGATIVE mg/dL   Protein, ur NEGATIVE NEGATIVE mg/dL   Urobilinogen, UA 0.2 0.0 - 1.0 mg/dL   Nitrite NEGATIVE NEGATIVE   Leukocytes, UA NEGATIVE NEGATIVE   Urine microscopic-add on   Collection Time: 12/04/13  3:18 PM  Result Value Ref Range   Squamous Epithelial / LPF FEW (A) RARE   WBC, UA 3-6 <3 WBC/hpf   RBC / HPF 3-6 <3 RBC/hpf   Bacteria, UA FEW (A) RARE  Glucose, capillary   Collection Time: 12/04/13  8:56 PM  Result Value Ref Range   Glucose-Capillary 208 (H) 70 - 99 mg/dL   Comment 1 Notify RN   Comprehensive metabolic panel   Collection Time: 12/05/13  5:29 AM  Result Value Ref Range   Sodium 143 137 - 147 mEq/L   Potassium 3.6 (L) 3.7 - 5.3 mEq/L   Chloride 104 96 - 112 mEq/L   CO2 29 19 - 32 mEq/L   Glucose, Bld 94 70 - 99 mg/dL   BUN 5 (L) 6 - 23 mg/dL   Creatinine, Ser 0.77 0.50 - 1.10 mg/dL   Calcium 10.1 8.4 - 10.5 mg/dL   Total Protein 7.0 6.0 - 8.3 g/dL   Albumin 3.4 (L) 3.5 - 5.2 g/dL   AST 14 0 - 37 U/L   ALT 7 0 - 35 U/L   Alkaline Phosphatase 69 39 - 117 U/L   Total Bilirubin 0.5 0.3 - 1.2 mg/dL   GFR calc non Af Amer 89 (L) >90 mL/min   GFR calc Af Amer >90 >90 mL/min   Anion gap 10 5 - 15  Vitamin B12   Collection Time: 12/05/13  5:29 AM  Result Value Ref Range   Vitamin B-12 237 211 - 911 pg/mL  RPR  Collection Time: 12/05/13  5:29 AM  Result Value Ref Range   RPR Ser Ql NON REAC NON REAC  Homocysteine   Collection Time: 12/05/13  5:29 AM  Result Value Ref Range   Homocysteine 11.9 4.0 - 15.4 umol/L  CK total and CKMB (cardiac)   Collection Time: 12/05/13  5:29 AM  Result Value Ref Range   Total CK 103 7 - 177 U/L   CK, MB 2.1 0.3 - 4.0 ng/mL   Relative Index 2.0 0.0 - 2.5  HIV antibody   Collection Time: 12/05/13  5:29 AM  Result Value Ref Range   HIV 1&2 Ab, 4th Generation NONREACTIVE NONREACTIVE  Glucose, capillary   Collection Time: 12/05/13  7:23 AM  Result Value Ref Range   Glucose-Capillary 125 (H) 70 - 99 mg/dL  Glucose, capillary   Collection Time: 12/05/13 11:27 AM  Result Value Ref Range   Glucose-Capillary 187 (H) 70 - 99 mg/dL  Results for orders placed or performed  in visit on 09/11/13 (from the past 8736 hour(s))  Lipid panel   Collection Time: 09/11/13 11:01 AM  Result Value Ref Range   Cholesterol 127 0 - 200 mg/dL   Triglycerides 45 <150 mg/dL   HDL 59 >39 mg/dL   Total CHOL/HDL Ratio 2.2 Ratio   VLDL 9 0 - 40 mg/dL   LDL Cholesterol 59 0 - 99 mg/dL  Comprehensive metabolic panel   Collection Time: 09/11/13 11:01 AM  Result Value Ref Range   Sodium 137 135 - 145 mEq/L   Potassium 4.0 3.5 - 5.3 mEq/L   Chloride 102 96 - 112 mEq/L   CO2 29 19 - 32 mEq/L   Glucose, Bld 123 (H) 70 - 99 mg/dL   BUN 7 6 - 23 mg/dL   Creat 0.81 0.50 - 1.10 mg/dL   Total Bilirubin 0.5 0.2 - 1.2 mg/dL   Alkaline Phosphatase 63 39 - 117 U/L   AST 13 0 - 37 U/L   ALT 8 0 - 35 U/L   Total Protein 6.7 6.0 - 8.3 g/dL   Albumin 3.9 3.5 - 5.2 g/dL   Calcium 9.9 8.4 - 10.5 mg/dL   Assessment Axis Ischizophrenia Axis II deferred Axis III see medical history Axis IV mild to moderate  Axis V 60 Plan: I took her vitals.  I reviewed CC, tobacco/med/surg Hx, meds effects/ side effects, problem list, therapies and responses as well as current situation/symptoms discussed options. She'll continue Paxil at 40 mg each bedtime.  trazodone at 75 mg each bedtime and continue Risperdal 2 mg each bedtime.. She'll continue Xanax as needed and return to see me in 3 months Her husband will call me if her situation worsens See orders and pt instructions for more details.  MEDICATIONS this encounter: Meds ordered this encounter  Medications  . PARoxetine (PAXIL) 40 MG tablet    Sig: Take 1 tablet (40 mg total) by mouth daily.    Dispense:  90 tablet    Refill:  2  . risperiDONE (RISPERDAL) 2 MG tablet    Sig: Take 1 tablet (2 mg total) by mouth at bedtime.    Dispense:  90 tablet    Refill:  2  . traZODone (DESYREL) 150 MG tablet    Sig: Take 0.5 tablets (75 mg total) by mouth at bedtime.    Dispense:  45 tablet    Refill:  2  . ALPRAZolam (XANAX) 0.25 MG tablet    Sig:  Take 1 tablet (0.25 mg  total) by mouth 3 (three) times daily.    Dispense:  270 tablet    Refill:  1    Medical Decision Making Problem Points:  Established problem, stable/improving (1), Established problem, worsening (2), Review of last therapy session (1) and Review of psycho-social stressors (1) Data Points:  Review or order medicine tests (1) Review of medication regiment & side effects (2) Review of new medications or change in dosage (2)  I certify that outpatient services furnished can reasonably be expected to improve the patient's condition.   Levonne Spiller, MD

## 2014-06-27 ENCOUNTER — Encounter (HOSPITAL_COMMUNITY): Payer: Self-pay | Admitting: Psychiatry

## 2014-06-27 ENCOUNTER — Ambulatory Visit (INDEPENDENT_AMBULATORY_CARE_PROVIDER_SITE_OTHER): Payer: BLUE CROSS/BLUE SHIELD | Admitting: Psychiatry

## 2014-06-27 VITALS — BP 124/74 | HR 71 | Ht 69.0 in | Wt 185.8 lb

## 2014-06-27 DIAGNOSIS — F200 Paranoid schizophrenia: Secondary | ICD-10-CM | POA: Diagnosis not present

## 2014-06-27 MED ORDER — ALPRAZOLAM 0.25 MG PO TABS: 0.2500 mg | ORAL_TABLET | Freq: Three times a day (TID) | ORAL | Status: DC

## 2014-06-27 MED ORDER — TRAZODONE HCL 150 MG PO TABS
75.0000 mg | ORAL_TABLET | Freq: Every day | ORAL | Status: DC
Start: 2014-06-27 — End: 2014-10-09

## 2014-06-27 MED ORDER — PAROXETINE HCL 40 MG PO TABS: 40.0000 mg | ORAL_TABLET | Freq: Every day | ORAL | Status: DC

## 2014-06-27 MED ORDER — RISPERIDONE 2 MG PO TABS: 2.0000 mg | ORAL_TABLET | Freq: Every day | ORAL | Status: DC

## 2014-06-27 NOTE — Progress Notes (Signed)
Patient ID: Rachel [name], female   DOB: October 13, 1950, 64 y.o.   MRN: [id number] Patient ID: Rachel [name], female   DOB: 07/02/1950, 65 y.o.   MRN: [id number] Patient ID: Rachel [name], female   DOB: Apr 04, 1950, 64 y.o.   MRN: [id number] Patient ID: Rachel [name], female   DOB: 05-14-1950, 64 y.o.   MRN: [id number] Patient ID: Rachel [name], female   DOB: 1950-09-10, 64 y.o.   MRN: [id number] Patient ID: Rachel [name], female   DOB: 07/07/1950, 64 y.o.   MRN: [id number] Patient ID: Rachel [name], female   DOB: 1951/02/07, 64 y.o.   MRN: [id number] Patient ID: Rachel [name], female   DOB: 1950/07/27, 64 y.o.   MRN: [id number] Patient ID: Rachel [name], female   DOB: March 19, 1950, 64 y.o.   MRN: [id number] Patient ID: Rachel [name], female   DOB: Sep 30, 1950, 64 y.o.   MRN: [id number] [facility] [id number] Progress Note Rachel [name] MRN: [id number] DOB: [birth year] Age: 64 y.o.  Date: 06/27/2014 Start Time: 1:18 PM End Time: 1:53 PM  Chief Complaint: Chief Complaint  Patient presents with  . Paranoid   Subjective: "She is doing better This patient is a 64 year old married black female lives with her husband in [place]. She has not worked in years but is not on disability. She is a very poor historian   Apparently she has had mental illness for many years. At one point she was depressed but also had psychotic symptoms that sound congruent with schizophrenia. Her husband thinks she is doing pretty well on her current medications but she tends to sleep too much during the day. We've looked at her med list and found that her trazodone might be too high. She does better if she only takes 100 mg instead of 200 mg. She also takes meclizine in the morning which may be contributing to drowsiness. Neither one of them remember why she is on lthis.  The patient denies being depressed but her energy is poor and she is drowsy. She doesn't  sleep well she'll start to have visual hallucinations at night. This happens if she sleeps throughout the whole day. She denies auditory hallucinations or paranoia.  The patient returns after 3 months with her husband. As usual her affect is very blunted and she says very little. However she denies auditory or visual hallucinations or paranoia and her husband concurs. She's fallen a few times when she's gotten up out of bed and I urged him to help her. She is blind in both eyes and obviously can't see where she is at. Her mood is generally pretty good.  Current psychiatric medication paxil 40 mg daily Trazodone 75 mg at bedtime Xanax 0.25 mg 3 times a day Risperdal 2 mg at bedtime     Past psychiatric history Patient has been seeing in this office since 1998.  She was admitted at [facility] due to severe depression and psychotic features.  At that time she was having suicidal thoughts .  Since then she has been stable on current psychiatric medication. Allergies: Allergies  Allergen Reactions  . Sulfa Antibiotics Anaphylaxis  . Ibuprofen Other (See Comments)    unknown  Medical History: Past Medical History  Diagnosis Date  . Diabetes mellitus type I   . Hyperlipidemia   . Vertigo   . GERD (gastroesophageal reflux disease)   . Systemic hypertension   . Paroxysmal atrial fibrillation   . Schizophrenia   . Blindness  Patient has history of vertigo, GERD, hyperlipidemia, diabetes mellitus.  Patient is also legally blind.  She see Dr. Karren Burly for her annual checkup and will follow up for elevated Hemo A1c with him. Surgical History: Past Surgical History  Procedure Laterality Date  . Brain tumor excision    . US echocardiography  12/20/2003    mild mitral annular ca+,mild MR,TR,PI,AOV mildly sclerotic  . Nm myocar perf wall motion  02/01/2007    no significant ischemia  Reviewed during this appointment. Vitals: BP 124/74 mmHg  Pulse 71  Ht 5\' 9"  (1.753 m)  Wt 84.278  kg (185 lb 12.8 oz)  BMI 27.43 kg/m2 Wt down almost 3 pounds.  Mental status examination Patient is casually dressed and fairly groomed. Patient is legally blind and need her husband`s  assistance for walking. She described her mood as ok.  She is calm and not at all fidgety today. Her affect is blunted. Her speech is sparse and she answers in monosyllables She denies any active or passive suicidal thinking and homicidal thinking. Her thought process is  Negative for paranoid statementsShe is very blunted . She denies any auditory or visual hallucination.  She has poverty of thought content.  She's alert and oriented x3 her insight judgment and impulse control is okay. There no tremors shakes or extrapyramidal side effects noted.  Lab Results:  Results for orders placed or performed during the hospital encounter of 12/04/13 (from the past 8736 hour(s))  CBG monitoring, ED   Collection Time: 12/04/13  1:46 PM  Result Value Ref Range   Glucose-Capillary 118 (H) 70 - 99 mg/dL  CBC with Differential   Collection Time: 12/04/13  2:00 PM  Result Value Ref Range   WBC 7.1 4.0 - 10.5 K/uL   RBC 4.37 3.87 - 5.11 MIL/uL   Hemoglobin 13.1 12.0 - 15.0 g/dL   HCT 39.1 36.0 - 46.0 %   MCV 89.5 78.0 - 100.0 fL   MCH 30.0 26.0 - 34.0 pg   MCHC 33.5 30.0 - 36.0 g/dL   RDW 12.6 11.5 - 15.5 %   Platelets 262 150 - 400 K/uL   Neutrophils Relative % 45 43 - 77 %   Neutro Abs 3.2 1.7 - 7.7 K/uL   Lymphocytes Relative 41 12 - 46 %   Lymphs Abs 2.9 0.7 - 4.0 K/uL   Monocytes Relative 10 3 - 12 %   Monocytes Absolute 0.7 0.1 - 1.0 K/uL   Eosinophils Relative 3 0 - 5 %   Eosinophils Absolute 0.2 0.0 - 0.7 K/uL   Basophils Relative 1 0 - 1 %   Basophils Absolute 0.0 0.0 - 0.1 K/uL  Comprehensive metabolic panel   Collection Time: 12/04/13  2:00 PM  Result Value Ref Range   Sodium 139 137 - 147 mEq/L   Potassium 3.4 (L) 3.7 - 5.3 mEq/L   Chloride 102 96 - 112 mEq/L   CO2 27 19 - 32 mEq/L   Glucose,  Bld 139 (H) 70 - 99 mg/dL   BUN 5 (L) 6 - 23 mg/dL   Creatinine, Ser 0.81 0.50 - 1.10 mg/dL   Calcium 10.1 8.4 - 10.5 mg/dL   Total Protein 7.5 6.0 - 8.3 g/dL   Albumin 3.8 3.5 - 5.2 g/dL   AST 14 0 - 37 U/L   ALT 9 0 - 35 U/L   Alkaline Phosphatase 74 39 - 117 U/L   Total Bilirubin 0.4 0.3 - 1.2 mg/dL   GFR calc non  Af Amer 77 (L) >90 mL/min   GFR calc Af Amer 89 (L) >90 mL/min   Anion gap 10 5 - 15  Protime-INR   Collection Time: 12/04/13  2:00 PM  Result Value Ref Range   Prothrombin Time 13.6 11.6 - 15.2 seconds   INR 1.03 0.00 - 1.49  Digoxin level   Collection Time: 12/04/13  2:00 PM  Result Value Ref Range   Digoxin Level 0.7 (L) 0.8 - 2.0 ng/mL  Ethanol   Collection Time: 12/04/13  2:00 PM  Result Value Ref Range   Alcohol, Ethyl (B) <11 0 - 11 mg/dL  TSH   Collection Time: 12/04/13  2:00 PM  Result Value Ref Range   TSH 1.510 0.350 - 4.500 uIU/mL  Urine culture   Collection Time: 12/04/13  3:18 PM  Result Value Ref Range   Specimen Description URINE, CLEAN CATCH    Special Requests NONE    Culture  Setup Time      12/05/2013 19:38 Performed at [place] Performed at [facility]    Culture NO GROWTH Performed at [facility]    Report Status 12/06/2013 FINAL   Urine Drug Screen   Collection Time: 12/04/13  3:18 PM  Result Value Ref Range   Opiates NONE DETECTED NONE DETECTED   Cocaine NONE DETECTED NONE DETECTED   Benzodiazepines POSITIVE (A) NONE DETECTED   Amphetamines NONE DETECTED NONE DETECTED   Tetrahydrocannabinol NONE DETECTED NONE DETECTED   Barbiturates NONE DETECTED NONE DETECTED  Urinalysis, Routine w reflex microscopic   Collection Time: 12/04/13  3:18 PM  Result Value Ref Range   Color, Urine YELLOW YELLOW   APPearance CLEAR CLEAR   Specific Gravity, Urine 1.025 1.005 - 1.030   pH 6.0 5.0 - 8.0   Glucose, UA NEGATIVE NEGATIVE mg/dL   Hgb urine dipstick SMALL (A) NEGATIVE   Bilirubin  Urine NEGATIVE NEGATIVE   Ketones, ur 15 (A) NEGATIVE mg/dL   Protein, ur NEGATIVE NEGATIVE mg/dL   Urobilinogen, UA 0.2 0.0 - 1.0 mg/dL   Nitrite NEGATIVE NEGATIVE   Leukocytes, UA NEGATIVE NEGATIVE  Urine microscopic-add on   Collection Time: 12/04/13  3:18 PM  Result Value Ref Range   Squamous Epithelial / LPF FEW (A) RARE   WBC, UA 3-6 <3 WBC/hpf   RBC / HPF 3-6 <3 RBC/hpf   Bacteria, UA FEW (A) RARE  Glucose, capillary   Collection Time: 12/04/13  8:56 PM  Result Value Ref Range   Glucose-Capillary 208 (H) 70 - 99 mg/dL   Comment 1 Notify RN   Comprehensive metabolic panel   Collection Time: 12/05/13  5:29 AM  Result Value Ref Range   Sodium 143 137 - 147 mEq/L   Potassium 3.6 (L) 3.7 - 5.3 mEq/L   Chloride 104 96 - 112 mEq/L   CO2 29 19 - 32 mEq/L   Glucose, Bld 94 70 - 99 mg/dL   BUN 5 (L) 6 - 23 mg/dL   Creatinine, Ser 0.77 0.50 - 1.10 mg/dL   Calcium 10.1 8.4 - 10.5 mg/dL   Total Protein 7.0 6.0 - 8.3 g/dL   Albumin 3.4 (L) 3.5 - 5.2 g/dL   AST 14 0 - 37 U/L   ALT 7 0 - 35 U/L   Alkaline Phosphatase 69 39 - 117 U/L   Total Bilirubin 0.5 0.3 - 1.2 mg/dL   GFR calc non Af Amer 89 (L) >90 mL/min   GFR  calc Af Amer >90 >90 mL/min   Anion gap 10 5 - 15  Vitamin B12   Collection Time: 12/05/13  5:29 AM  Result Value Ref Range   Vitamin B-12 237 211 - 911 pg/mL  RPR   Collection Time: 12/05/13  5:29 AM  Result Value Ref Range   RPR Ser Ql NON REAC NON REAC  Homocysteine   Collection Time: 12/05/13  5:29 AM  Result Value Ref Range   Homocysteine 11.9 4.0 - 15.4 umol/L  CK total and CKMB (cardiac)   Collection Time: 12/05/13  5:29 AM  Result Value Ref Range   Total CK 103 7 - 177 U/L   CK, MB 2.1 0.3 - 4.0 ng/mL   Relative Index 2.0 0.0 - 2.5  HIV antibody   Collection Time: 12/05/13  5:29 AM  Result Value Ref Range   HIV 1&2 Ab, 4th Generation NONREACTIVE NONREACTIVE  Glucose, capillary   Collection Time: 12/05/13  7:23 AM  Result Value Ref Range    Glucose-Capillary 125 (H) 70 - 99 mg/dL  Glucose, capillary   Collection Time: 12/05/13 11:27 AM  Result Value Ref Range   Glucose-Capillary 187 (H) 70 - 99 mg/dL  Results for orders placed or performed in visit on 09/11/13 (from the past 8736 hour(s))  Lipid panel   Collection Time: 09/11/13 11:01 AM  Result Value Ref Range   Cholesterol 127 0 - 200 mg/dL   Triglycerides 45 <150 mg/dL   HDL 59 >39 mg/dL   Total CHOL/HDL Ratio 2.2 Ratio   VLDL 9 0 - 40 mg/dL   LDL Cholesterol 59 0 - 99 mg/dL  Comprehensive metabolic panel   Collection Time: 09/11/13 11:01 AM  Result Value Ref Range   Sodium 137 135 - 145 mEq/L   Potassium 4.0 3.5 - 5.3 mEq/L   Chloride 102 96 - 112 mEq/L   CO2 29 19 - 32 mEq/L   Glucose, Bld 123 (H) 70 - 99 mg/dL   BUN 7 6 - 23 mg/dL   Creat 0.81 0.50 - 1.10 mg/dL   Total Bilirubin 0.5 0.2 - 1.2 mg/dL   Alkaline Phosphatase 63 39 - 117 U/L   AST 13 0 - 37 U/L   ALT 8 0 - 35 U/L   Total Protein 6.7 6.0 - 8.3 g/dL   Albumin 3.9 3.5 - 5.2 g/dL   Calcium 9.9 8.4 - 10.5 mg/dL   Assessment Axis Ischizophrenia Axis II deferred Axis III see medical history Axis IV mild to moderate  Axis V 60 Plan: I took her vitals.  I reviewed CC, tobacco/med/surg Hx, meds effects/ side effects, problem list, therapies and responses as well as current situation/symptoms discussed options. She'll continue Paxil at 40 mg each bedtime for depression.  trazodone at 75 mg each bedtime for sleep and continue Risperdal 2 mg each bedtime for schizophrenia.. She'll continue Xanax as needed for anxiety and return to see me in 3 months Her husband will call me if her situation worsens See orders and pt instructions for more details.  MEDICATIONS this encounter: Meds ordered this encounter  Medications  . traZODone (DESYREL) 150 MG tablet    Sig: Take 0.5 tablets (75 mg total) by mouth at bedtime.    Dispense:  45 tablet    Refill:  2  . risperiDONE (RISPERDAL) 2 MG tablet    Sig:  Take 1 tablet (2 mg total) by mouth at bedtime.    Dispense:  90 tablet    Refill:  2  . PARoxetine (PAXIL) 40 MG tablet    Sig: Take 1 tablet (40 mg total) by mouth daily.    Dispense:  90 tablet    Refill:  2  . ALPRAZolam (XANAX) 0.25 MG tablet    Sig: Take 1 tablet (0.25 mg total) by mouth 3 (three) times daily.    Dispense:  270 tablet    Refill:  1    Medical Decision Making Problem Points:  Established problem, stable/improving (1), Established problem, worsening (2), Review of last therapy session (1) and Review of psycho-social stressors (1) Data Points:  Review or order medicine tests (1) Review of medication regiment & side effects (2) Review of new medications or change in dosage (2)  I certify that outpatient services furnished can reasonably be expected to improve the patient's condition.   Levonne Spiller, MD

## 2014-07-21 ENCOUNTER — Other Ambulatory Visit: Payer: Self-pay | Admitting: Cardiovascular Disease

## 2014-07-23 NOTE — Telephone Encounter (Signed)
E SENT TO PHARMACY 

## 2014-09-21 ENCOUNTER — Ambulatory Visit (INDEPENDENT_AMBULATORY_CARE_PROVIDER_SITE_OTHER): Payer: BLUE CROSS/BLUE SHIELD | Admitting: Cardiovascular Disease

## 2014-09-21 ENCOUNTER — Encounter: Payer: Self-pay | Admitting: Cardiovascular Disease

## 2014-09-21 VITALS — BP 144/90 | HR 87 | Ht 68.0 in | Wt 179.0 lb

## 2014-09-21 DIAGNOSIS — I10 Essential (primary) hypertension: Secondary | ICD-10-CM | POA: Diagnosis not present

## 2014-09-21 DIAGNOSIS — E785 Hyperlipidemia, unspecified: Secondary | ICD-10-CM

## 2014-09-21 DIAGNOSIS — I480 Paroxysmal atrial fibrillation: Secondary | ICD-10-CM | POA: Diagnosis not present

## 2014-09-21 NOTE — Progress Notes (Signed)
Patient ID: Rachel [name], female   DOB: 09-16-1950, 64 y.o.   MRN: [id number]     Cardiology Office Note   Date:  09/22/2014   ID:  Rachel [name], DOB Jul 04, 1950, MRN [id number]  PCP:  Maggie Font, MD  Cardiologist:   Sanda Klein, MD   Chief Complaint  Patient presents with  . Annual Exam    Patient has felt light headed, dizzy, and SOB.      History of Present Illness: Rachel [name] is a 64 y.o. female who presents for  Follow-up for history of paroxysmal atrial fibrillation , hypertension and hyperlipidemia. She has significant neuropsychiatric problems that include schizophrenia and excision of a brain tumor in the remote past.    She has not had any palpitations and atrial fibrillation has not been documented in a very long time. She does not have signs or symptoms of congestive heart failure and has never complained of anginal chest pain. She denies syncope, new focal neurological deficits, intermittent claudication or lower  Extremity edema. She does experience occasional dizziness and lightheadedness. She is extremely sedentary.    Past Medical History  Diagnosis Date  . Diabetes mellitus type I   . Hyperlipidemia   . Vertigo   . GERD (gastroesophageal reflux disease)   . Systemic hypertension   . Paroxysmal atrial fibrillation   . Schizophrenia   . Blindness     Past Surgical History  Procedure Laterality Date  . Brain tumor excision    . US echocardiography  12/20/2003    mild mitral annular ca+,mild MR,TR,PI,AOV mildly sclerotic  . Nm myocar perf wall motion  02/01/2007    no significant ischemia     Current Outpatient Prescriptions  Medication Sig Dispense Refill  . acetaminophen (TYLENOL) 500 MG tablet Take 1,000 mg by mouth every 6 (six) hours as needed for mild pain.    Marland Kitchen ALPRAZolam (XANAX) 0.25 MG tablet Take 1 tablet (0.25 mg total) by mouth 3 (three) times daily. 270 tablet 1  . aspirin 325 MG tablet Take 325 mg by mouth  daily.    . digoxin (LANOXIN) 0.125 MG tablet Take 1 tablet (0.125 mg total) by mouth daily. 90 tablet 2  . diltiazem (CARDIZEM CD) 180 MG 24 hr capsule TAKE 1 CAPSULE DAILY 90 capsule 0  . famotidine (PEPCID) 20 MG tablet Take 20 mg by mouth daily.     Marland Kitchen gabapentin (NEURONTIN) 300 MG capsule Take 300 mg by mouth at bedtime.     Marland Kitchen KLOR-CON 10 10 MEQ tablet Take 10 mEq by mouth daily.     . meclizine (ANTIVERT) 25 MG tablet Take 25 mg by mouth daily.    Marland Kitchen NOVOLIN N 100 UNIT/ML injection Inject 12 Units into the skin 2 (two) times daily at 8 am and 10 pm.     . PARoxetine (PAXIL) 40 MG tablet Take 1 tablet (40 mg total) by mouth daily. 90 tablet 2  . risperiDONE (RISPERDAL) 2 MG tablet Take 1 tablet (2 mg total) by mouth at bedtime. 90 tablet 2  . rosuvastatin (CRESTOR) 10 MG tablet Take 1 tablet (10 mg total) by mouth daily. 90 tablet 3  . traZODone (DESYREL) 150 MG tablet Take 0.5 tablets (75 mg total) by mouth at bedtime. 45 tablet 2   No current facility-administered medications for this visit.    Allergies:   Sulfa antibiotics and Ibuprofen    Social History:  The patient  reports that she has never smoked. She has never  used smokeless tobacco. She reports that she does not drink alcohol or use illicit drugs.   Family History:  The patient's family history is negative for ADD / ADHD, Alcohol abuse, Drug abuse, Anxiety disorder, Bipolar disorder, Dementia, Depression, OCD, Paranoid behavior, Schizophrenia, Seizures, Sexual abuse, and Physical abuse.    ROS:  Please see the history of present illness.    Otherwise, review of systems positive for none.   All other systems are reviewed and negative.    PHYSICAL EXAM: VS:  BP 144/90 mmHg  Pulse 87  Ht 5\' 8"  (1.727 m)  Wt 179 lb (81.194 kg)  BMI 27.22 kg/m2 , BMI Body mass index is 27.22 kg/(m^2).  General: Alert, oriented x3, no distress Head: no evidence of trauma, PERRL, EOMI, no exophtalmos or lid lag, no myxedema, no  xanthelasma; normal ears, nose and oropharynx Neck: normal jugular venous pulsations and no hepatojugular reflux; brisk carotid pulses without delay and no carotid bruits Chest: clear to auscultation, no signs of consolidation by percussion or palpation, normal fremitus, symmetrical and full respiratory excursions Cardiovascular: normal position and quality of the apical impulse, regular rhythm, normal first and second heart sounds, no  murmurs, rubs or gallops Abdomen: no tenderness or distention, no masses by palpation, no abnormal pulsatility or arterial bruits, normal bowel sounds, no hepatosplenomegaly Extremities: no clubbing, cyanosis or edema; 2+ radial, ulnar and brachial pulses bilaterally; 2+ right femoral, posterior tibial and dorsalis pedis pulses; 2+ left femoral, posterior tibial and dorsalis pedis pulses; no subclavian or femoral bruits Neurological: grossly nonfocal Psych: euthymic mood, full affect   EKG:  EKG is ordered today. The ekg ordered today demonstrates  Normal sinus rhythm, left atrial lead normality, incomplete right bundle branch block and left anterior fascicular block with left axis deviation, QTC 435 ms   Recent Labs: 12/04/2013: Hemoglobin 13.1; Platelets 262; TSH 1.510 12/05/2013: ALT 7; BUN 5*; Creatinine, Ser 0.77; Potassium 3.6*; Sodium 143    Lipid Panel    Component Value Date/Time   CHOL 127 09/11/2013 1101   TRIG 45 09/11/2013 1101   HDL 59 09/11/2013 1101   CHOLHDL 2.2 09/11/2013 1101   VLDL 9 09/11/2013 1101   LDLCALC 59 09/11/2013 1101      Wt Readings from Last 3 Encounters:  09/21/14 179 lb (81.194 kg)  06/27/14 185 lb 12.8 oz (84.278 kg)  03/29/14 187 lb (84.823 kg)    .   ASSESSMENT AND PLAN:  1. Paroxysmal atrial fibrillation  No clinically apparent events. Atrial fibrillation has not been documented in many years. Relatively Low CHADS2Vasc2 score. Would not recommend warfarin or a novel anticoagulant. Aspirin would be  sufficient for stroke prophylaxis at this point. As she gets olderand her risk of embolic events increases, the risk-benefit will have to be carefully analyzed. I do not think we can rely on her to be compliant with anticoagulation therapy, but her husband does take excellent care of her and she has not had any injuries or bleeding problems that I am aware of.  Dyslipidemia  Excellent recent lipid profile HTN (hypertension)   borderline elevation in blood pressure today , usually much better control   Current medicines are reviewed at length with the patient today.  The patient does not have concerns regarding medicines.  The following changes have been made:  no change  Labs/ tests ordered today include:  No orders of the defined types were placed in this encounter.    Patient Instructions  Dr. Sallyanne Kuster recommends that you  schedule a follow-up appointment in:  ONE YEAR.       Mikael Spray, MD  09/22/2014 6:28 PM    Sanda Klein, MD, [facility] [phone] office [phone] pager

## 2014-09-21 NOTE — Patient Instructions (Signed)
Dr. Croitoru recommends that you schedule a follow-up appointment in: ONE YEAR   

## 2014-10-01 ENCOUNTER — Ambulatory Visit (HOSPITAL_COMMUNITY): Payer: Self-pay | Admitting: Psychiatry

## 2014-10-04 ENCOUNTER — Other Ambulatory Visit: Payer: Self-pay | Admitting: Cardiovascular Disease

## 2014-10-05 NOTE — Telephone Encounter (Signed)
Rx(s) sent to pharmacy electronically.  

## 2014-10-09 ENCOUNTER — Encounter (HOSPITAL_COMMUNITY): Payer: Self-pay | Admitting: Psychiatry

## 2014-10-09 ENCOUNTER — Ambulatory Visit (INDEPENDENT_AMBULATORY_CARE_PROVIDER_SITE_OTHER): Payer: BLUE CROSS/BLUE SHIELD | Admitting: Psychiatry

## 2014-10-09 VITALS — BP 143/81 | HR 81 | Ht 68.0 in | Wt 186.8 lb

## 2014-10-09 DIAGNOSIS — F209 Schizophrenia, unspecified: Secondary | ICD-10-CM

## 2014-10-09 DIAGNOSIS — F200 Paranoid schizophrenia: Secondary | ICD-10-CM

## 2014-10-09 MED ORDER — ALPRAZOLAM 0.25 MG PO TABS: 0.2500 mg | ORAL_TABLET | Freq: Three times a day (TID) | ORAL | Status: DC

## 2014-10-09 MED ORDER — TRAZODONE HCL 150 MG PO TABS: 75.0000 mg | ORAL_TABLET | Freq: Every day | ORAL | Status: DC

## 2014-10-09 MED ORDER — PAROXETINE HCL 40 MG PO TABS: 40.0000 mg | ORAL_TABLET | Freq: Every day | ORAL | Status: DC

## 2014-10-09 MED ORDER — RISPERIDONE 2 MG PO TABS: 2.0000 mg | ORAL_TABLET | Freq: Every day | ORAL | Status: DC

## 2014-10-09 NOTE — Progress Notes (Signed)
Patient ID: Rachel [name], female   DOB: 28-May-1950, 64 y.o.   MRN: [id number] Patient ID: Rachel [name], female   DOB: Sep 15, 1950, 64 y.o.   MRN: [id number] Patient ID: Rachel [name], female   DOB: 12-Nov-1950, 64 y.o.   MRN: [id number] Patient ID: Rachel [name], female   DOB: February 12, 1950, 64 y.o.   MRN: [id number] Patient ID: Rachel [name], female   DOB: 1950/10/19, 64 y.o.   MRN: [id number] Patient ID: Rachel [name], female   DOB: October 22, 1950, 64 y.o.   MRN: [id number] Patient ID: Rachel [name], female   DOB: 03/18/1950, 64 y.o.   MRN: [id number] Patient ID: Rachel [name], female   DOB: 08-Nov-1950, 64 y.o.   MRN: [id number] Patient ID: Rachel [name], female   DOB: Jul 20, 1950, 64 y.o.   MRN: [id number] Patient ID: Rachel [name], female   DOB: 08-May-1950, 64 y.o.   MRN: [id number] Patient ID: Rachel [name], female   DOB: 03-Feb-1951, 64 y.o.   MRN: [id number] [facility] [id number] Progress Note Rachel [name] MRN: [id number] DOB: 03-02-1950 Age: 64 y.o.  Date: 10/09/2014 Start Time: 1:18 PM End Time: 1:53 PM  Chief Complaint: Chief Complaint  Patient presents with  . Schizophrenia  . Depression  . Follow-up   Subjective: "She is doing better This patient is a 64 year old married black female lives with her husband in [place]. She has not worked in years but is not on disability. She is a very poor historian   Apparently she has had mental illness for many years. At one point she was depressed but also had psychotic symptoms that sound congruent with schizophrenia. Her husband thinks she is doing pretty well on her current medications but she tends to sleep too much during the day. We've looked at her med list and found that her trazodone might be too high. She does better if she only takes 100 mg instead of 200 mg. She also takes meclizine in the morning which may be contributing to drowsiness. Neither one of them  remember why she is on lthis.  The patient denies being depressed but her energy is poor and she is drowsy. She doesn't sleep well she'll start to have visual hallucinations at night. This happens if she sleeps throughout the whole day. She denies auditory hallucinations or paranoia.  The patient returns after 3 months with her husband. As usual her affect is very blunted and she says very little. However she denies auditory or visual hallucinations or paranoia and her husband concurs. She states that they're going to the funeral of her husband's father who died recently. She shows very little emotion about this. She sleeps better when her husband is home but has more difficulty when he works at night. Overall however her mood has been stable. She denies any symptoms of jerking or twitching in her muscles  Current psychiatric medication paxil 40 mg daily Trazodone 75 mg at bedtime Xanax 0.25 mg 3 times a day Risperdal 2 mg at bedtime     Past psychiatric history Patient has been seeing in this office since 1998.  She was admitted at [facility] due to severe depression and psychotic features.  At that time she was having suicidal thoughts .  Since then she has been stable on current psychiatric medication. Allergies: Allergies  Allergen Reactions  . Sulfa Antibiotics Anaphylaxis  . Ibuprofen Other (See Comments)    unknown  Medical History: Past Medical History  Diagnosis Date  .  Diabetes mellitus type I   . Hyperlipidemia   . Vertigo   . GERD (gastroesophageal reflux disease)   . Systemic hypertension   . Paroxysmal atrial fibrillation   . Schizophrenia   . Blindness   Patient has history of vertigo, GERD, hyperlipidemia, diabetes mellitus.  Patient is also legally blind.  She see Dr. Karren Burly for her annual checkup and will follow up for elevated Hemo A1c with him. Surgical History: Past Surgical History  Procedure Laterality Date  . Brain tumor excision    . US  echocardiography  12/20/2003    mild mitral annular ca+,mild MR,TR,PI,AOV mildly sclerotic  . Nm myocar perf wall motion  02/01/2007    no significant ischemia  Reviewed during this appointment. Vitals: BP 143/81 mmHg  Pulse 81  Ht 5\' 8"  (1.727 m)  Wt 186 lb 12.8 oz (84.732 kg)  BMI 28.41 kg/m2 Wt down almost 3 pounds.  Mental status examination Patient is casually dressed and fairly groomed. Patient is legally blind and need her husband`s  assistance for walking. She described her mood as ok.  She is calm and not at all fidgety today. Her affect is blunted. Her speech is sparse and she answers in monosyllables She denies any active or passive suicidal thinking and homicidal thinking. Her thought process is  Negative for paranoid statementsShe is very blunted . She denies any auditory or visual hallucination.  She has poverty of thought content.  She's alert and oriented x3 her insight judgment and impulse control is okay. There no tremors shakes or extrapyramidal side effects noted.  Lab Results:  Results for orders placed or performed during the hospital encounter of 12/04/13 (from the past 8736 hour(s))  CBG monitoring, ED   Collection Time: 12/04/13  1:46 PM  Result Value Ref Range   Glucose-Capillary 118 (H) 70 - 99 mg/dL  CBC with Differential   Collection Time: 12/04/13  2:00 PM  Result Value Ref Range   WBC 7.1 4.0 - 10.5 K/uL   RBC 4.37 3.87 - 5.11 MIL/uL   Hemoglobin 13.1 12.0 - 15.0 g/dL   HCT 39.1 36.0 - 46.0 %   MCV 89.5 78.0 - 100.0 fL   MCH 30.0 26.0 - 34.0 pg   MCHC 33.5 30.0 - 36.0 g/dL   RDW 12.6 11.5 - 15.5 %   Platelets 262 150 - 400 K/uL   Neutrophils Relative % 45 43 - 77 %   Neutro Abs 3.2 1.7 - 7.7 K/uL   Lymphocytes Relative 41 12 - 46 %   Lymphs Abs 2.9 0.7 - 4.0 K/uL   Monocytes Relative 10 3 - 12 %   Monocytes Absolute 0.7 0.1 - 1.0 K/uL   Eosinophils Relative 3 0 - 5 %   Eosinophils Absolute 0.2 0.0 - 0.7 K/uL   Basophils Relative 1 0 - 1 %    Basophils Absolute 0.0 0.0 - 0.1 K/uL  Comprehensive metabolic panel   Collection Time: 12/04/13  2:00 PM  Result Value Ref Range   Sodium 139 137 - 147 mEq/L   Potassium 3.4 (L) 3.7 - 5.3 mEq/L   Chloride 102 96 - 112 mEq/L   CO2 27 19 - 32 mEq/L   Glucose, Bld 139 (H) 70 - 99 mg/dL   BUN 5 (L) 6 - 23 mg/dL   Creatinine, Ser 0.81 0.50 - 1.10 mg/dL   Calcium 10.1 8.4 - 10.5 mg/dL   Total Protein 7.5 6.0 - 8.3 g/dL   Albumin 3.8 3.5 - 5.2  g/dL   AST 14 0 - 37 U/L   ALT 9 0 - 35 U/L   Alkaline Phosphatase 74 39 - 117 U/L   Total Bilirubin 0.4 0.3 - 1.2 mg/dL   GFR calc non Af Amer 77 (L) >90 mL/min   GFR calc Af Amer 89 (L) >90 mL/min   Anion gap 10 5 - 15  Protime-INR   Collection Time: 12/04/13  2:00 PM  Result Value Ref Range   Prothrombin Time 13.6 11.6 - 15.2 seconds   INR 1.03 0.00 - 1.49  Digoxin level   Collection Time: 12/04/13  2:00 PM  Result Value Ref Range   Digoxin Level 0.7 (L) 0.8 - 2.0 ng/mL  Ethanol   Collection Time: 12/04/13  2:00 PM  Result Value Ref Range   Alcohol, Ethyl (B) <11 0 - 11 mg/dL  TSH   Collection Time: 12/04/13  2:00 PM  Result Value Ref Range   TSH 1.510 0.350 - 4.500 uIU/mL  Urine culture   Collection Time: 12/04/13  3:18 PM  Result Value Ref Range   Specimen Description URINE, CLEAN CATCH    Special Requests NONE    Culture  Setup Time      12/05/2013 19:38 Performed at [place] Performed at [facility]    Culture NO GROWTH Performed at [facility]    Report Status 12/06/2013 FINAL   Urine Drug Screen   Collection Time: 12/04/13  3:18 PM  Result Value Ref Range   Opiates NONE DETECTED NONE DETECTED   Cocaine NONE DETECTED NONE DETECTED   Benzodiazepines POSITIVE (A) NONE DETECTED   Amphetamines NONE DETECTED NONE DETECTED   Tetrahydrocannabinol NONE DETECTED NONE DETECTED   Barbiturates NONE DETECTED NONE DETECTED  Urinalysis, Routine w reflex microscopic    Collection Time: 12/04/13  3:18 PM  Result Value Ref Range   Color, Urine YELLOW YELLOW   APPearance CLEAR CLEAR   Specific Gravity, Urine 1.025 1.005 - 1.030   pH 6.0 5.0 - 8.0   Glucose, UA NEGATIVE NEGATIVE mg/dL   Hgb urine dipstick SMALL (A) NEGATIVE   Bilirubin Urine NEGATIVE NEGATIVE   Ketones, ur 15 (A) NEGATIVE mg/dL   Protein, ur NEGATIVE NEGATIVE mg/dL   Urobilinogen, UA 0.2 0.0 - 1.0 mg/dL   Nitrite NEGATIVE NEGATIVE   Leukocytes, UA NEGATIVE NEGATIVE  Urine microscopic-add on   Collection Time: 12/04/13  3:18 PM  Result Value Ref Range   Squamous Epithelial / LPF FEW (A) RARE   WBC, UA 3-6 <3 WBC/hpf   RBC / HPF 3-6 <3 RBC/hpf   Bacteria, UA FEW (A) RARE  Glucose, capillary   Collection Time: 12/04/13  8:56 PM  Result Value Ref Range   Glucose-Capillary 208 (H) 70 - 99 mg/dL   Comment 1 Notify RN   Comprehensive metabolic panel   Collection Time: 12/05/13  5:29 AM  Result Value Ref Range   Sodium 143 137 - 147 mEq/L   Potassium 3.6 (L) 3.7 - 5.3 mEq/L   Chloride 104 96 - 112 mEq/L   CO2 29 19 - 32 mEq/L   Glucose, Bld 94 70 - 99 mg/dL   BUN 5 (L) 6 - 23 mg/dL   Creatinine, Ser 0.77 0.50 - 1.10 mg/dL   Calcium 10.1 8.4 - 10.5 mg/dL   Total Protein 7.0 6.0 - 8.3 g/dL   Albumin 3.4 (L) 3.5 - 5.2 g/dL   AST 14 0 - 37 U/L  ALT 7 0 - 35 U/L   Alkaline Phosphatase 69 39 - 117 U/L   Total Bilirubin 0.5 0.3 - 1.2 mg/dL   GFR calc non Af Amer 89 (L) >90 mL/min   GFR calc Af Amer >90 >90 mL/min   Anion gap 10 5 - 15  Vitamin B12   Collection Time: 12/05/13  5:29 AM  Result Value Ref Range   Vitamin B-12 237 211 - 911 pg/mL  RPR   Collection Time: 12/05/13  5:29 AM  Result Value Ref Range   RPR Ser Ql NON REAC NON REAC  Homocysteine   Collection Time: 12/05/13  5:29 AM  Result Value Ref Range   Homocysteine 11.9 4.0 - 15.4 umol/L  CK total and CKMB (cardiac)   Collection Time: 12/05/13  5:29 AM  Result Value Ref Range   Total CK 103 7 - 177 U/L   CK, MB  2.1 0.3 - 4.0 ng/mL   Relative Index 2.0 0.0 - 2.5  HIV antibody   Collection Time: 12/05/13  5:29 AM  Result Value Ref Range   HIV 1&2 Ab, 4th Generation NONREACTIVE NONREACTIVE  Glucose, capillary   Collection Time: 12/05/13  7:23 AM  Result Value Ref Range   Glucose-Capillary 125 (H) 70 - 99 mg/dL  Glucose, capillary   Collection Time: 12/05/13 11:27 AM  Result Value Ref Range   Glucose-Capillary 187 (H) 70 - 99 mg/dL   Assessment Axis Ischizophrenia Axis II deferred Axis III see medical history Axis IV mild to moderate  Axis V 60 Plan: I took her vitals.  I reviewed CC, tobacco/med/surg Hx, meds effects/ side effects, problem list, therapies and responses as well as current situation/symptoms discussed options. She'll continue Paxil at 40 mg each bedtime for depression.  trazodone at 75 mg each bedtime for sleep and continue Risperdal 2 mg each bedtime for schizophrenia.. She'll continue Xanax as needed for anxiety and return to see me in 3 months Her husband will call me if her situation worsens See orders and pt instructions for more details.  MEDICATIONS this encounter: Meds ordered this encounter  Medications  . aspirin 81 MG tablet    Sig: Take 81 mg by mouth daily.  . traZODone (DESYREL) 150 MG tablet    Sig: Take 0.5 tablets (75 mg total) by mouth at bedtime.    Dispense:  45 tablet    Refill:  2  . risperiDONE (RISPERDAL) 2 MG tablet    Sig: Take 1 tablet (2 mg total) by mouth at bedtime.    Dispense:  90 tablet    Refill:  2  . PARoxetine (PAXIL) 40 MG tablet    Sig: Take 1 tablet (40 mg total) by mouth daily.    Dispense:  90 tablet    Refill:  2  . ALPRAZolam (XANAX) 0.25 MG tablet    Sig: Take 1 tablet (0.25 mg total) by mouth 3 (three) times daily.    Dispense:  270 tablet    Refill:  1    Medical Decision Making Problem Points:  Established problem, stable/improving (1), Established problem, worsening (2), Review of last therapy session (1) and  Review of psycho-social stressors (1) Data Points:  Review or order medicine tests (1) Review of medication regiment & side effects (2) Review of new medications or change in dosage (2)  I certify that outpatient services furnished can reasonably be expected to improve the patient's condition.   Levonne Spiller, MD

## 2014-10-31 ENCOUNTER — Other Ambulatory Visit: Payer: Self-pay | Admitting: *Deleted

## 2014-10-31 MED ORDER — DIGOXIN 125 MCG PO TABS: 0.1250 mg | ORAL_TABLET | Freq: Every day | ORAL | Status: DC

## 2014-12-24 ENCOUNTER — Other Ambulatory Visit: Payer: Self-pay

## 2014-12-24 MED ORDER — ROSUVASTATIN CALCIUM 10 MG PO TABS: 10.0000 mg | ORAL_TABLET | Freq: Every day | ORAL | Status: DC

## 2015-01-02 ENCOUNTER — Ambulatory Visit (INDEPENDENT_AMBULATORY_CARE_PROVIDER_SITE_OTHER): Payer: BLUE CROSS/BLUE SHIELD | Admitting: Psychiatry

## 2015-01-02 ENCOUNTER — Encounter (HOSPITAL_COMMUNITY): Payer: Self-pay | Admitting: Psychiatry

## 2015-01-02 VITALS — BP 140/100 | Ht 68.0 in | Wt 181.0 lb

## 2015-01-02 DIAGNOSIS — F200 Paranoid schizophrenia: Secondary | ICD-10-CM

## 2015-01-02 MED ORDER — PAROXETINE HCL 40 MG PO TABS: 40.0000 mg | ORAL_TABLET | Freq: Every day | ORAL | Status: DC

## 2015-01-02 MED ORDER — RISPERIDONE 2 MG PO TABS: 2.0000 mg | ORAL_TABLET | Freq: Every day | ORAL | Status: DC

## 2015-01-02 MED ORDER — TRAZODONE HCL 150 MG PO TABS: 75.0000 mg | ORAL_TABLET | Freq: Every day | ORAL | Status: DC

## 2015-01-02 MED ORDER — ALPRAZOLAM 0.25 MG PO TABS: 0.2500 mg | ORAL_TABLET | Freq: Three times a day (TID) | ORAL | Status: DC

## 2015-01-02 NOTE — Progress Notes (Signed)
Patient ID: Rachel [name], female   DOB: [birth year], 64 y.o.   MRN: [id number] Patient ID: Rachel [name], female   DOB: December 16, 1950, 64 y.o.   MRN: [id number] Patient ID: Rachel [name], female   DOB: [birth year], 64 y.o.   MRN: [id number] Patient ID: Rachel [name], female   DOB: 1950/03/15, 64 y.o.   MRN: [id number] Patient ID: Rachel [name], female   DOB: 1950-06-19, 64 y.o.   MRN: [id number] Patient ID: Rachel [name], female   DOB: 23-Jan-1951, 64 y.o.   MRN: [id number] Patient ID: Rachel [name], female   DOB: 27-Oct-1950, 64 y.o.   MRN: [id number] Patient ID: Rachel [name], female   DOB: [birth year], 64 y.o.   MRN: [id number] Patient ID: Rachel [name], female   DOB: 10/30/1950, 64 y.o.   MRN: [id number] Patient ID: Rachel [name], female   DOB: 07-Dec-1950, 64 y.o.   MRN: [id number] Patient ID: Rachel [name], female   DOB: [birth year], 64 y.o.   MRN: [id number] Patient ID: Rachel [name], female   DOB: 1950-07-22, 64 y.o.   MRN: [id number] [facility] [id number] Progress Note Rachel [name] MRN: [id number] DOB: Jan 22, 1951 Age: 64 y.o.  Date: 01/02/2015 Start Time: 1:18 PM End Time: 1:53 PM  Chief Complaint: Chief Complaint  Patient presents with  . Schizophrenia  . Follow-up   Subjective: "She is doing ok" This patient is a 64 year old married black female lives with her husband in [place]. She has not worked in years but is not on disability. She is a very poor historian   Apparently she has had mental illness for many years. At one point she was depressed but also had psychotic symptoms that sound congruent with schizophrenia. Her husband thinks she is doing pretty well on her current medications but she tends to sleep too much during the day. We've looked at her med list and found that her trazodone might be too high. She does better if she only takes 100 mg instead of 200 mg. She also takes meclizine in the  morning which may be contributing to drowsiness. Neither one of them remember why she is on lthis.  The patient denies being depressed but her energy is poor and she is drowsy. She doesn't sleep well she'll start to have visual hallucinations at night. This happens if she sleeps throughout the whole day. She denies auditory hallucinations or paranoia.  The patient returns after 3 months with her husband. As usual her affect is very blunted and she says very little. However she denies auditory or visual hallucinations or paranoia and her husband concurs. She states that she has a bad cold and doesn't feel well. She denies being depressed or having any thoughts of suicide. She denies twitching or jerking in her muscles or any tremor.      Past psychiatric history Patient has been seeing in this office since 1998.  She was admitted at [facility] due to severe depression and psychotic features.  At that time she was having suicidal thoughts .  Since then she has been stable on current psychiatric medication. Allergies: Allergies  Allergen Reactions  . Sulfa Antibiotics Anaphylaxis  . Ibuprofen Other (See Comments)    unknown  Medical History: Past Medical History  Diagnosis Date  . Diabetes mellitus type I ([place])   . Hyperlipidemia   . Vertigo   . GERD (gastroesophageal reflux disease)   . Systemic hypertension   . Paroxysmal atrial fibrillation ([facility])   .  Schizophrenia ([place])   . Blindness   Patient has history of vertigo, GERD, hyperlipidemia, diabetes mellitus.  Patient is also legally blind.  She see Dr. Karren Burly for her annual checkup and will follow up for elevated Hemo A1c with him. Surgical History: Past Surgical History  Procedure Laterality Date  . Brain tumor excision    . US echocardiography  12/20/2003    mild mitral annular ca+,mild MR,TR,PI,AOV mildly sclerotic  . Nm myocar perf wall motion  02/01/2007    no significant ischemia  Reviewed during this  appointment. Vitals: BP 140/100 mmHg  Ht 5\' 8"  (1.727 m)  Wt 181 lb (82.101 kg)  BMI 27.53 kg/m2 Wt down almost 3 pounds.  Mental status examination Patient is casually dressed and fairly groomed. Patient is legally blind and need her husband`s  assistance for walking. She described her mood as ok.  She is calm and not at all fidgety today. Her affect is blunted. Her speech is sparse and she answers in monosyllables She denies any active or passive suicidal thinking and homicidal thinking. Her thought process is  Negative for paranoid statementsShe is very blunted . She denies any auditory or visual hallucination.  She has poverty of thought content.  She's alert and oriented x3 her insight judgment and impulse control is okay. There no tremors shakes or extrapyramidal side effects noted.  Lab Results:  No results found for this or any previous visit (from the past 8736 hour(s)). Assessment Axis Ischizophrenia Axis II deferred Axis III see medical history Axis IV mild to moderate  Axis V 60 Plan: I took her vitals.  I reviewed CC, tobacco/med/surg Hx, meds effects/ side effects, problem list, therapies and responses as well as current situation/symptoms discussed options. She'll continue Paxil at 40 mg each bedtime for depression.  trazodone at 75 mg each bedtime for sleep and continue Risperdal 2 mg each bedtime for schizophrenia.. She'll continue Xanax as needed for anxiety and return to see me in 4 months Her husband will call me if her situation worsens See orders and pt instructions for more details.  MEDICATIONS this encounter: Meds ordered this encounter  Medications  . PARoxetine (PAXIL) 40 MG tablet    Sig: Take 1 tablet (40 mg total) by mouth daily.    Dispense:  90 tablet    Refill:  2  . risperiDONE (RISPERDAL) 2 MG tablet    Sig: Take 1 tablet (2 mg total) by mouth at bedtime.    Dispense:  90 tablet    Refill:  2  . traZODone (DESYREL) 150 MG tablet    Sig: Take 0.5  tablets (75 mg total) by mouth at bedtime.    Dispense:  45 tablet    Refill:  2  . ALPRAZolam (XANAX) 0.25 MG tablet    Sig: Take 1 tablet (0.25 mg total) by mouth 3 (three) times daily.    Dispense:  270 tablet    Refill:  1    Medical Decision Making Problem Points:  Established problem, stable/improving (1), Established problem, worsening (2), Review of last therapy session (1) and Review of psycho-social stressors (1) Data Points:  Review or order medicine tests (1) Review of medication regiment & side effects (2) Review of new medications or change in dosage (2)  I certify that outpatient services furnished can reasonably be expected to improve the patient's condition.   Levonne Spiller, MD

## 2015-04-26 ENCOUNTER — Encounter (HOSPITAL_COMMUNITY): Payer: Self-pay | Admitting: Psychiatry

## 2015-04-26 ENCOUNTER — Ambulatory Visit (INDEPENDENT_AMBULATORY_CARE_PROVIDER_SITE_OTHER): Payer: BLUE CROSS/BLUE SHIELD | Admitting: Psychiatry

## 2015-04-26 VITALS — BP 131/87 | HR 86 | Ht 68.0 in | Wt 177.0 lb

## 2015-04-26 DIAGNOSIS — F200 Paranoid schizophrenia: Secondary | ICD-10-CM

## 2015-04-26 MED ORDER — TRAZODONE HCL 150 MG PO TABS: 75.0000 mg | ORAL_TABLET | Freq: Every day | ORAL | Status: DC

## 2015-04-26 MED ORDER — PAROXETINE HCL 40 MG PO TABS: 40.0000 mg | ORAL_TABLET | Freq: Every day | ORAL | Status: DC

## 2015-04-26 MED ORDER — RISPERIDONE 2 MG PO TABS: 2.0000 mg | ORAL_TABLET | Freq: Every day | ORAL | Status: DC

## 2015-04-26 MED ORDER — ALPRAZOLAM 0.25 MG PO TABS: 0.2500 mg | ORAL_TABLET | Freq: Three times a day (TID) | ORAL | Status: DC

## 2015-04-26 NOTE — Progress Notes (Signed)
Patient ID: Rachel [name], female   DOB: May 23, 1950, 65 y.o.   MRN: [id number] Patient ID: Rachel [name], female   DOB: 1950-09-15, 65 y.o.   MRN: [id number] Patient ID: Rachel [name], female   DOB: Jul 21, 1950, 65 y.o.   MRN: [id number] Patient ID: Rachel [name], female   DOB: 04/11/1950, 65 y.o.   MRN: [id number] Patient ID: Rachel [name], female   DOB: 04-27-1950, 65 y.o.   MRN: [id number] Patient ID: Rachel [name], female   DOB: [birth year], 65 y.o.   MRN: [id number] Patient ID: Rachel [name], female   DOB: 10-15-1950, 65 y.o.   MRN: [id number] Patient ID: Rachel [name], female   DOB: 1950-06-01, 65 y.o.   MRN: [id number] Patient ID: Rachel [name], female   DOB: 07-17-1950, 65 y.o.   MRN: [id number] Patient ID: Rachel [name], female   DOB: 23-Oct-1950, 65 y.o.   MRN: [id number] Patient ID: Rachel [name], female   DOB: 1950/03/20, 65 y.o.   MRN: [id number] Patient ID: Rachel [name], female   DOB: Aug 14, 1950, 65 y.o.   MRN: [id number] Patient ID: Rachel [name], female   DOB: 12-30-1950, 65 y.o.   MRN: [id number] [facility] [id number] Progress Note Rachel [name] MRN: [id number] DOB: 02/17/1950 Age: 65 y.o.  Date: 04/26/2015 Start Time: 1:18 PM End Time: 1:53 PM  Chief Complaint: Chief Complaint  Patient presents with  . Schizophrenia  . Follow-up   Subjective: "She is doing ok" This patient is a 65 year old married black female lives with her husband in [place]. She has not worked in years but is not on disability. She is a very poor historian   Apparently she has had mental illness for many years. At one point she was depressed but also had psychotic symptoms that sound congruent with schizophrenia. Her husband thinks she is doing pretty well on her current medications but she tends to sleep too much during the day. We've looked at her med list and found that her trazodone might be too high. She does  better if she only takes 100 mg instead of 200 mg. She also takes meclizine in the morning which may be contributing to drowsiness. Neither one of them remember why she is on lthis.  The patient denies being depressed but her energy is poor and she is drowsy. She doesn't sleep well she'll start to have visual hallucinations at night. This happens if she sleeps throughout the whole day. She denies auditory hallucinations or paranoia.  The patient returns after 3 months with her husband. As usual her affect is very blunted and she says very little. She is almost blind and has difficulty just sitting down in a chair. However she denies auditory or visual hallucinations or paranoia and her husband concurs.She denies being depressed or having any thoughts of suicide. She denies twitching or jerking in her muscles or any tremor.      Past psychiatric history Patient has been seeing in this office since 1998.  She was admitted at [facility] due to severe depression and psychotic features.  At that time she was having suicidal thoughts .  Since then she has been stable on current psychiatric medication. Allergies: Allergies  Allergen Reactions  . Sulfa Antibiotics Anaphylaxis  . Ibuprofen Other (See Comments)    unknown  Medical History: Past Medical History  Diagnosis Date  . Diabetes mellitus type I ([place])   . Hyperlipidemia   . Vertigo   . GERD (  gastroesophageal reflux disease)   . Systemic hypertension   . Paroxysmal atrial fibrillation (HCC)   . Schizophrenia ([place])   . Blindness   Patient has history of vertigo, GERD, hyperlipidemia, diabetes mellitus.  Patient is also legally blind.  She see Dr. Karren Burly for her annual checkup and will follow up for elevated Hemo A1c with him. Surgical History: Past Surgical History  Procedure Laterality Date  . Brain tumor excision    . US echocardiography  12/20/2003    mild mitral annular ca+,mild MR,TR,PI,AOV mildly sclerotic  . Nm myocar  perf wall motion  02/01/2007    no significant ischemia  Reviewed during this appointment. Vitals: BP 131/87 mmHg  Pulse 86  Ht 5\' 8"  (1.727 m)  Wt 177 lb (80.287 kg)  BMI 26.92 kg/m2  SpO2 94% Wt down almost 3 pounds.  Mental status examination Patient is casually dressed and fairly groomed. Patient is legally blind and need her husband`s  assistance for walking. She described her mood as ok.  She is calm and not at all fidgety today. Her affect is blunted. Her speech is sparse and she answers in monosyllables She denies any active or passive suicidal thinking and homicidal thinking. Her thought process is  Negative for paranoid statementsShe is very blunted . She denies any auditory or visual hallucination.  She has poverty of thought content.  She's alert and oriented x3 her insight judgment and impulse control is okay. There no tremors shakes or extrapyramidal side effects noted.  Lab Results:  No results found for this or any previous visit (from the past 8736 hour(s)). Assessment Axis Ischizophrenia Axis II deferred Axis III see medical history Axis IV mild to moderate  Axis V 60 Plan: I took her vitals.  I reviewed CC, tobacco/med/surg Hx, meds effects/ side effects, problem list, therapies and responses as well as current situation/symptoms discussed options. She'll continue Paxil at 40 mg each bedtime for depression.  trazodone at 75 mg each bedtime for sleep and continue Risperdal 2 mg each bedtime for schizophrenia.. She'll continue Xanax as needed for anxiety and return to see me in 4 months Her husband will call me if her situation worsens See orders and pt instructions for more details.  MEDICATIONS this encounter: Meds ordered this encounter  Medications  . traZODone (DESYREL) 150 MG tablet    Sig: Take 0.5 tablets (75 mg total) by mouth at bedtime.    Dispense:  45 tablet    Refill:  3  . risperiDONE (RISPERDAL) 2 MG tablet    Sig: Take 1 tablet (2 mg total) by  mouth at bedtime.    Dispense:  90 tablet    Refill:  2  . PARoxetine (PAXIL) 40 MG tablet    Sig: Take 1 tablet (40 mg total) by mouth daily.    Dispense:  90 tablet    Refill:  2  . ALPRAZolam (XANAX) 0.25 MG tablet    Sig: Take 1 tablet (0.25 mg total) by mouth 3 (three) times daily.    Dispense:  270 tablet    Refill:  1    Medical Decision Making Problem Points:  Established problem, stable/improving (1), Established problem, worsening (2), Review of last therapy session (1) and Review of psycho-social stressors (1) Data Points:  Review or order medicine tests (1) Review of medication regiment & side effects (2) Review of new medications or change in dosage (2)  I certify that outpatient services furnished can reasonably be expected to improve the patient's  condition.   Levonne Spiller, MD

## 2015-07-16 ENCOUNTER — Other Ambulatory Visit (HOSPITAL_COMMUNITY): Payer: Self-pay | Admitting: Family Medicine

## 2015-07-16 DIAGNOSIS — R519 Headache, unspecified: Secondary | ICD-10-CM

## 2015-07-16 DIAGNOSIS — R51 Headache: Principal | ICD-10-CM

## 2015-07-26 ENCOUNTER — Ambulatory Visit (HOSPITAL_COMMUNITY)
Admission: RE | Admit: 2015-07-26 | Discharge: 2015-07-26 | Disposition: A | Discharge status: Home / Self Care | Payer: BLUE CROSS/BLUE SHIELD | Source: Ambulatory Visit | Attending: Family Medicine | Admitting: Family Medicine

## 2015-07-26 DIAGNOSIS — R51 Headache: Secondary | ICD-10-CM | POA: Diagnosis not present

## 2015-07-26 DIAGNOSIS — R519 Headache, unspecified: Secondary | ICD-10-CM

## 2015-08-15 ENCOUNTER — Emergency Department (HOSPITAL_COMMUNITY)
Admission: EM | Admit: 2015-08-15 | Discharge: 2015-08-18 | Disposition: A | Discharge status: Other Acute Inpatient Hospital | Payer: BLUE CROSS/BLUE SHIELD | Attending: Emergency Medicine | Admitting: Emergency Medicine

## 2015-08-15 ENCOUNTER — Encounter (HOSPITAL_COMMUNITY): Payer: Self-pay | Admitting: Emergency Medicine

## 2015-08-15 ENCOUNTER — Emergency Department (HOSPITAL_COMMUNITY): Payer: BLUE CROSS/BLUE SHIELD

## 2015-08-15 ENCOUNTER — Telehealth (HOSPITAL_COMMUNITY): Payer: Self-pay | Admitting: *Deleted

## 2015-08-15 DIAGNOSIS — R441 Visual hallucinations: Secondary | ICD-10-CM | POA: Insufficient documentation

## 2015-08-15 DIAGNOSIS — Z7982 Long term (current) use of aspirin: Secondary | ICD-10-CM | POA: Insufficient documentation

## 2015-08-15 DIAGNOSIS — E785 Hyperlipidemia, unspecified: Secondary | ICD-10-CM | POA: Insufficient documentation

## 2015-08-15 DIAGNOSIS — I10 Essential (primary) hypertension: Secondary | ICD-10-CM | POA: Diagnosis not present

## 2015-08-15 DIAGNOSIS — Z7984 Long term (current) use of oral hypoglycemic drugs: Secondary | ICD-10-CM | POA: Diagnosis not present

## 2015-08-15 DIAGNOSIS — I4891 Unspecified atrial fibrillation: Secondary | ICD-10-CM | POA: Diagnosis not present

## 2015-08-15 DIAGNOSIS — E119 Type 2 diabetes mellitus without complications: Secondary | ICD-10-CM | POA: Insufficient documentation

## 2015-08-15 DIAGNOSIS — R4182 Altered mental status, unspecified: Secondary | ICD-10-CM | POA: Diagnosis present

## 2015-08-15 DIAGNOSIS — Z79899 Other long term (current) drug therapy: Secondary | ICD-10-CM | POA: Diagnosis not present

## 2015-08-15 LAB — COMPREHENSIVE METABOLIC PANEL
ALBUMIN: 3.7 g/dL (ref 3.5–5.0)
ALT: 13 U/L — AB (ref 14–54)
AST: 15 U/L (ref 15–41)
Alkaline Phosphatase: 58 U/L (ref 38–126)
Anion gap: 6 (ref 5–15)
BILIRUBIN TOTAL: 0.5 mg/dL (ref 0.3–1.2)
BUN: 6 mg/dL (ref 6–20)
CALCIUM: 9.6 mg/dL (ref 8.9–10.3)
CO2: 24 mmol/L (ref 22–32)
CREATININE: 0.72 mg/dL (ref 0.44–1.00)
Chloride: 106 mmol/L (ref 101–111)
GFR calc Af Amer: >60 mL/min (ref >60)
Glucose, Bld: 145 mg/dL — ABNORMAL HIGH (ref 65–99)
POTASSIUM: 3.5 mmol/L (ref 3.5–5.1)
SODIUM: 136 mmol/L (ref 135–145)
Total Protein: 7.1 g/dL (ref 6.5–8.1)

## 2015-08-15 LAB — URINALYSIS, ROUTINE W REFLEX MICROSCOPIC
BILIRUBIN URINE: NEGATIVE
Glucose, UA: NEGATIVE mg/dL
Ketones, ur: NEGATIVE mg/dL
Nitrite: NEGATIVE
Protein, ur: NEGATIVE mg/dL
SPECIFIC GRAVITY, URINE: 1.01 (ref 1.005–1.030)
pH: 7.5 (ref 5.0–8.0)

## 2015-08-15 LAB — CBC
HCT: 38.3 % (ref 36.0–46.0)
HEMOGLOBIN: 12.8 g/dL (ref 12.0–15.0)
MCH: 29.8 pg (ref 26.0–34.0)
MCHC: 33.4 g/dL (ref 30.0–36.0)
MCV: 89.3 fL (ref 78.0–100.0)
Platelets: 233 10*3/uL (ref 150–400)
RBC: 4.29 MIL/uL (ref 3.87–5.11)
RDW: 12.3 % (ref 11.5–15.5)
WBC: 7.7 10*3/uL (ref 4.0–10.5)

## 2015-08-15 LAB — DIGOXIN LEVEL: DIGOXIN LVL: 0.4 ng/mL — AB (ref 0.8–2.0)

## 2015-08-15 LAB — RAPID URINE DRUG SCREEN, HOSP PERFORMED
AMPHETAMINES: NOT DETECTED
Barbiturates: NOT DETECTED
Benzodiazepines: POSITIVE — AB
Cocaine: NOT DETECTED
Opiates: NOT DETECTED
TETRAHYDROCANNABINOL: NOT DETECTED

## 2015-08-15 LAB — URINE MICROSCOPIC-ADD ON

## 2015-08-15 LAB — CBG MONITORING, ED: Glucose-Capillary: 226 mg/dL — ABNORMAL HIGH (ref 65–99)

## 2015-08-15 LAB — ETHANOL

## 2015-08-15 MED ORDER — FAMOTIDINE 20 MG PO TABS
20.0000 mg | ORAL_TABLET | Freq: Every day | ORAL | Status: DC
  Administered 2015-08-15 – 2015-08-18 (×3): 20 mg via ORAL
  Filled 2015-08-15 (×3): qty 1

## 2015-08-15 MED ORDER — ALPRAZOLAM 0.5 MG PO TABS
0.2500 mg | ORAL_TABLET | Freq: Three times a day (TID) | ORAL | Status: DC
  Administered 2015-08-15 – 2015-08-18 (×5): 0.25 mg via ORAL
  Filled 2015-08-15 (×5): qty 1

## 2015-08-15 MED ORDER — ROSUVASTATIN CALCIUM 10 MG PO TABS
10.0000 mg | ORAL_TABLET | Freq: Every day | ORAL | Status: DC
  Administered 2015-08-15 – 2015-08-18 (×3): 10 mg via ORAL
  Filled 2015-08-15 (×4): qty 1

## 2015-08-15 MED ORDER — DILTIAZEM HCL ER COATED BEADS 180 MG PO CP24
180.0000 mg | ORAL_CAPSULE | Freq: Every day | ORAL | Status: DC
  Administered 2015-08-17 – 2015-08-18 (×2): 180 mg via ORAL
  Filled 2015-08-15 (×2): qty 1

## 2015-08-15 MED ORDER — DILTIAZEM HCL ER COATED BEADS 180 MG PO CP24
ORAL_CAPSULE | ORAL | Status: AC
  Filled 2015-08-15: qty 1

## 2015-08-15 MED ORDER — RISPERIDONE 1 MG PO TABS
ORAL_TABLET | ORAL | Status: AC
  Filled 2015-08-15: qty 2

## 2015-08-15 MED ORDER — PAROXETINE HCL 20 MG PO TABS
ORAL_TABLET | ORAL | Status: AC
  Filled 2015-08-15: qty 2

## 2015-08-15 MED ORDER — RISPERIDONE 1 MG PO TABS
2.0000 mg | ORAL_TABLET | Freq: Every day | ORAL | Status: DC
  Administered 2015-08-15: 2 mg via ORAL
  Filled 2015-08-15 (×4): qty 2

## 2015-08-15 MED ORDER — INSULIN NPH (HUMAN) (ISOPHANE) 100 UNIT/ML ~~LOC~~ SUSP
12.0000 [IU] | Freq: Two times a day (BID) | SUBCUTANEOUS | Status: DC
  Administered 2015-08-16 – 2015-08-17 (×3): 12 [IU] via SUBCUTANEOUS
  Filled 2015-08-15: qty 10

## 2015-08-15 MED ORDER — TRAZODONE HCL 50 MG PO TABS
75.0000 mg | ORAL_TABLET | Freq: Every day | ORAL | Status: DC
  Administered 2015-08-15 – 2015-08-17 (×3): 75 mg via ORAL
  Filled 2015-08-15 (×3): qty 2

## 2015-08-15 MED ORDER — DIGOXIN 125 MCG PO TABS
0.1250 mg | ORAL_TABLET | Freq: Every day | ORAL | Status: DC
  Administered 2015-08-17 – 2015-08-18 (×2): 0.125 mg via ORAL
  Filled 2015-08-15: qty 1

## 2015-08-15 MED ORDER — PAROXETINE HCL 20 MG PO TABS
40.0000 mg | ORAL_TABLET | Freq: Every day | ORAL | Status: DC
  Administered 2015-08-15 – 2015-08-18 (×3): 40 mg via ORAL
  Filled 2015-08-15 (×4): qty 2

## 2015-08-15 NOTE — ED Notes (Signed)
TTS in progress 

## 2015-08-15 NOTE — ED Notes (Signed)
Pt assisted to BR by staff. Required verbal cueing to transfer from bed to chair and chair to commode

## 2015-08-15 NOTE — Telephone Encounter (Signed)
Pt husband called office and lm on vm stating pt is having problems and left number for office to call back. Called number that was provided on office voicemail and no response. Office will call back at another time.

## 2015-08-15 NOTE — ED Provider Notes (Signed)
CSN: [id number]     Arrival date & time 08/15/15  23 History   First MD Initiated Contact with Patient 08/15/15 1355     Chief Complaint  Patient presents with  . Altered Mental Status     (Consider location/radiation/quality/duration/timing/severity/associated sxs/prior Treatment) HPI Level V caveat applies due to altered mental status. Per husband patient has a history of schizophrenia and has had no recent medicine changes. He states she she has not slept for 3 nights and becoming increasingly confused and delusional as well as having visual hallucinations. She also is becoming increasingly lethargic as well. Denies any fever or chills. No vomiting or diarrhea. Past Medical History  Diagnosis Date  . Diabetes mellitus type I ([place])   . Hyperlipidemia   . Vertigo   . GERD (gastroesophageal reflux disease)   . Systemic hypertension   . Paroxysmal atrial fibrillation (HCC)   . Schizophrenia ([place])   . Blindness    Past Surgical History  Procedure Laterality Date  . Brain tumor excision    . US echocardiography  12/20/2003    mild mitral annular ca+,mild MR,TR,PI,AOV mildly sclerotic  . Nm myocar perf wall motion  02/01/2007    no significant ischemia   Family History  Problem Relation Age of Onset  . ADD / ADHD Neg Hx   . Alcohol abuse Neg Hx   . Drug abuse Neg Hx   . Anxiety disorder Neg Hx   . Bipolar disorder Neg Hx   . Dementia Neg Hx   . Depression Neg Hx   . OCD Neg Hx   . Paranoid behavior Neg Hx   . Schizophrenia Neg Hx   . Seizures Neg Hx   . Sexual abuse Neg Hx   . Physical abuse Neg Hx    Social History  Substance Use Topics  . Smoking status: Never Smoker   . Smokeless tobacco: Never Used  . Alcohol Use: No   OB History    No data available     Review of Systems  Unable to perform ROS: Mental status change      Allergies  Sulfa antibiotics and Ibuprofen  Home Medications   Prior to Admission medications   Medication Sig Start Date End  Date Taking? Authorizing Provider  acetaminophen (TYLENOL) 500 MG tablet Take 1,000 mg by mouth every 6 (six) hours as needed for mild pain.   Yes Historical Provider, MD  ALPRAZolam (XANAX) 0.25 MG tablet Take 1 tablet (0.25 mg total) by mouth 3 (three) times daily. 04/26/15  Yes Cloria Spring, MD  aspirin 81 MG tablet Take 81 mg by mouth daily.   Yes Historical Provider, MD  digoxin (LANOXIN) 0.125 MG tablet Take 1 tablet (0.125 mg total) by mouth daily. 10/31/14  Yes Mihai Croitoru, MD  diltiazem (CARDIZEM CD) 180 MG 24 hr capsule Take 1 capsule (180 mg total) by mouth daily. 10/05/14  Yes Mihai Croitoru, MD  famotidine (PEPCID) 20 MG tablet Take 20 mg by mouth daily.  01/08/11  Yes Historical Provider, MD  KLOR-CON 10 10 MEQ tablet Take 10 mEq by mouth daily.  04/06/11  Yes Historical Provider, MD  meclizine (ANTIVERT) 25 MG tablet Take 25 mg by mouth daily. 07/23/13  Yes Historical Provider, MD  NOVOLIN N 100 UNIT/ML injection Inject 12 Units into the skin 2 (two) times daily at 8 am and 10 pm.  11/25/10  Yes Historical Provider, MD  PARoxetine (PAXIL) 40 MG tablet Take 1 tablet (40 mg total) by mouth  daily. 04/26/15 04/25/16 Yes Cloria Spring, MD  risperiDONE (RISPERDAL) 2 MG tablet Take 1 tablet (2 mg total) by mouth at bedtime. 04/26/15  Yes Cloria Spring, MD  rosuvastatin (CRESTOR) 10 MG tablet Take 1 tablet (10 mg total) by mouth daily. 12/24/14  Yes Mihai Croitoru, MD  traZODone (DESYREL) 150 MG tablet Take 0.5 tablets (75 mg total) by mouth at bedtime. 04/26/15  Yes Cloria Spring, MD   BP 142/90 mmHg  Pulse 78  Temp(Src) 99.1 F (37.3 C) (Oral)  Resp 20  Ht 5\' 8"  (1.727 m)  Wt 177 lb (80.287 kg)  BMI 26.92 kg/m2  SpO2 100% Physical Exam  Constitutional: She appears well-developed and well-nourished. No distress.  HENT:  Head: Normocephalic and atraumatic.  Mouth/Throat: Oropharynx is clear and moist.  Eyes: EOM are normal. Pupils are equal, round, and reactive to light.  Neck:  Normal range of motion. Neck supple.  Cardiovascular: Normal rate and regular rhythm.   Pulmonary/Chest: Effort normal and breath sounds normal. No respiratory distress. She has no wheezes. She has no rales. She exhibits no tenderness.  Abdominal: Soft. Bowel sounds are normal. She exhibits no distension and no mass. There is tenderness (mild suprapubic tenderness with palpation.). There is no rebound and no guarding.  Musculoskeletal: Normal range of motion. She exhibits no edema or tenderness.  Neurological: She is alert.  Patient is oriented only to person. She follows simple commands. She is moving all of her extremities without focal deficit. Sensation appears grossly intact. Mildly lethargic with delayed responsiveness.  Skin: Skin is warm and dry. No rash noted. No erythema.  Nursing note and vitals reviewed.   ED Course  Procedures (including critical care time) Labs Review Labs Reviewed  COMPREHENSIVE METABOLIC PANEL - Abnormal; Notable for the following:    Glucose, Bld 145 (*)    ALT 13 (*)    All other components within normal limits  URINALYSIS, ROUTINE W REFLEX MICROSCOPIC (NOT AT [facility]) - Abnormal; Notable for the following:    Hgb urine dipstick SMALL (*)    Leukocytes, UA TRACE (*)    All other components within normal limits  URINE RAPID DRUG SCREEN, HOSP PERFORMED - Abnormal; Notable for the following:    Benzodiazepines POSITIVE (*)    All other components within normal limits  DIGOXIN LEVEL - Abnormal; Notable for the following:    Digoxin Level 0.4 (*)    All other components within normal limits  URINE MICROSCOPIC-ADD ON - Abnormal; Notable for the following:    Squamous Epithelial / LPF 0-5 (*)    Bacteria, UA RARE (*)    All other components within normal limits  CBC  ETHANOL    Imaging Review Dg Chest 2 View  08/15/2015  CLINICAL DATA:  Fever, altered mental status EXAM: CHEST  2 VIEW COMPARISON:  10/20 6/3 FINDINGS: Cardiomegaly again noted. No acute  infiltrate or pleural effusion. No pulmonary edema. Mild osteopenia thoracic spine. IMPRESSION: No active cardiopulmonary disease. Electronically Signed   By: Lahoma Crocker M.D.   On: 08/15/2015 13:51   I have personally reviewed and evaluated these images and lab results as part of my medical decision-making.   EKG Interpretation None      MDM   Final diagnoses:  Hallucinations, visual   Vital signs and laboratory workup with no acute findings. Medically cleared for psychiatric evaluation.   Seen by TTS and inpatient psychiatric treatment recommended.  Julianne Rice, MD 08/15/15 2134

## 2015-08-15 NOTE — ED Notes (Signed)
Husband state pt has not been sleeping at night and become increasingly confused. Pt alert to person, disoriented to place and time

## 2015-08-15 NOTE — BH Assessment (Addendum)
Tele Assessment Note   Rachel [name] is a 65 y.o. female who presents voluntarily to [place], accompanied by her husband, Rachel [name], due to altered mental status bought on by lack of sleep. Pt is a poor historian and not completely oriented. When asked if she knows why she is at the ED, pt replied "checking on my kidney". Pt presents as near catatonic and answers most questions with "I don't know" or something unintelligible.  Pt's husband is the primary historian for this assessment. Husband reports that pt has not slept in 3 days. Husband indicates that pt will stay up all night telling him of VH she is experiencing and having delusional type speech. Pt has a hx of paranoid schizophrenia and is followed by an OP psychiatrist. Husband states that pt has been taking all of her medication as prescribed and there had been no recent medication changes.   Diagnosis: Schizophrenia, paranoid type  Past Medical History:  Past Medical History  Diagnosis Date  . Diabetes mellitus type I ([place])   . Hyperlipidemia   . Vertigo   . GERD (gastroesophageal reflux disease)   . Systemic hypertension   . Paroxysmal atrial fibrillation (HCC)   . Schizophrenia ([place])   . Blindness     Past Surgical History  Procedure Laterality Date  . Brain tumor excision    . US echocardiography  12/20/2003    mild mitral annular ca+,mild MR,TR,PI,AOV mildly sclerotic  . Nm myocar perf wall motion  02/01/2007    no significant ischemia    Family History:  Family History  Problem Relation Age of Onset  . ADD / ADHD Neg Hx   . Alcohol abuse Neg Hx   . Drug abuse Neg Hx   . Anxiety disorder Neg Hx   . Bipolar disorder Neg Hx   . Dementia Neg Hx   . Depression Neg Hx   . OCD Neg Hx   . Paranoid behavior Neg Hx   . Schizophrenia Neg Hx   . Seizures Neg Hx   . Sexual abuse Neg Hx   . Physical abuse Neg Hx     Social History:  reports that she has never smoked. She has never used smokeless tobacco. She reports  that she does not drink alcohol or use illicit drugs.  Additional Social History:  Alcohol / Drug Use Pain Medications: see PTA meds Prescriptions: see PTA meds Over the Counter: see PTA meds History of alcohol / drug use?: No history of alcohol / drug abuse  CIWA: CIWA-Ar BP: 141/73 mmHg Pulse Rate: (!) 56 COWS:    PATIENT STRENGTHS: (choose at least two) Average or above average intelligence Motivation for treatment/growth Supportive family/friends  Allergies:  Allergies  Allergen Reactions  . Sulfa Antibiotics Anaphylaxis  . Ibuprofen Other (See Comments)    unknown    Home Medications:  (Not in a hospital admission)  OB/GYN Status:  No LMP recorded. Patient has had a hysterectomy.  General Assessment Data Location of Assessment: [facility] TTS Assessment: In system Is this a Tele or Face-to-Face Assessment?: Tele Assessment Is this an Initial Assessment or a Re-assessment for this encounter?: Initial Assessment Marital status: Married Is patient pregnant?: No Pregnancy Status: No Living Arrangements: Spouse/significant other Can pt return to current living arrangement?: Yes Admission Status: Voluntary Is patient capable of signing voluntary admission?: Yes Referral Source: Self/Family/Friend Insurance type: [place] Living Arrangements: Spouse/significant other Name of Psychiatrist: Tanyka Alders  Education Status Is patient  currently in school?: No  Risk to self with the past 6 months Suicidal Ideation: No Has patient been a risk to self within the past 6 months prior to admission? : No Suicidal Intent: No Has patient had any suicidal intent within the past 6 months prior to admission? : No Is patient at risk for suicide?: No Suicidal Plan?: No Has patient had any suicidal plan within the past 6 months prior to admission? : No Access to Means: No What has been your use of drugs/alcohol within the last 12 months?: none Previous  Attempts/Gestures: No How many times?: 0 Intentional Self Injurious Behavior: None Family Suicide History: Unknown Recent stressful life event(s): Other (Comment) (no sleep) Persecutory voices/beliefs?: No Depression: No Depression Symptoms: Insomnia Substance abuse history and/or treatment for substance abuse?: No Suicide prevention information given to non-admitted patients: Not applicable  Risk to Others within the past 6 months Homicidal Ideation: No Does patient have any lifetime risk of violence toward others beyond the six months prior to admission? : No Thoughts of Harm to Others: No Current Homicidal Intent: No Current Homicidal Plan: No Access to Homicidal Means: No History of harm to others?: No Assessment of Violence: None Noted Does patient have access to weapons?: No Criminal Charges Pending?: No Does patient have a court date: No Is patient on probation?: No  Psychosis Hallucinations: Visual Delusions: Unspecified  Mental Status Report Appearance/Hygiene: Unremarkable Eye Contact: Poor Motor Activity: Unremarkable Speech: Soft, Slow Level of Consciousness: Other (Comment) (minimally responsive) Mood: Other (Comment) (confused) Affect: Appropriate to circumstance Anxiety Level: None Thought Processes: Coherent Judgement: Partial Orientation: Person Obsessive Compulsive Thoughts/Behaviors: None  Cognitive Functioning Concentration: Unable to Assess Memory: Unable to Assess IQ: Average Insight: see judgement above Impulse Control: Good Appetite: Good Sleep: Decreased Total Hours of Sleep: 0 Vegetative Symptoms: None  ADLScreening [facility] Assessment Services) Patient's cognitive ability adequate to safely complete daily activities?: Yes Patient able to express need for assistance with ADLs?: Yes Independently performs ADLs?: Yes (appropriate for developmental age)  Prior Inpatient Therapy Prior Inpatient Therapy: No  Prior Outpatient  Therapy Prior Outpatient Therapy: No Does patient have an ACCT team?: No Does patient have Intensive In-House Services?  : No Does patient have [facility] services? : No Does patient have P4CC services?: No  ADL Screening (condition at time of admission) Patient's cognitive ability adequate to safely complete daily activities?: Yes Is the patient deaf or have difficulty hearing?: No Does the patient have difficulty seeing, even when wearing glasses/contacts?: No Does the patient have difficulty concentrating, remembering, or making decisions?: Yes Patient able to express need for assistance with ADLs?: Yes Does the patient have difficulty dressing or bathing?: No Independently performs ADLs?: Yes (appropriate for developmental age) Does the patient have difficulty walking or climbing stairs?: No Weakness of Legs: None Weakness of Arms/Hands: None  Home Assistive Devices/Equipment Home Assistive Devices/Equipment: None  Therapy Consults (therapy consults require a physician order) PT Evaluation Needed: No OT Evalulation Needed: No SLP Evaluation Needed: No Abuse/Neglect Assessment (Assessment to be complete while patient is alone) Physical Abuse: Denies Verbal Abuse: Denies Sexual Abuse: Denies Exploitation of patient/patient's resources: Denies Self-Neglect: Denies Values / Beliefs Cultural Requests During Hospitalization: None Spiritual Requests During Hospitalization: None Consults Spiritual Care Consult Needed: No Social Work Consult Needed: No Regulatory affairs officer (For Healthcare) Does patient have an advance directive?: No Would patient like information on creating an advanced directive?: No - patient declined information    Additional Information 1:1 In Past 12 Months?: No  CIRT Risk: No Elopement Risk: No Does patient have medical clearance?: Yes     Disposition:  Disposition Initial Assessment Completed for this Encounter: Yes (consulted with Catalina Pizza,  DNP) Disposition of Patient: Inpatient treatment program Type of inpatient treatment program: Adult (TTS to seek placement)  Rexene Edison 08/15/2015 4:42 PM

## 2015-08-15 NOTE — ED Notes (Signed)
notify Zailynn Mccombe at [phone] for any status changes on the pt.

## 2015-08-15 NOTE — ED Notes (Signed)
husband states she is taking her medication, having hallucinations due to lack of sleep.

## 2015-08-16 LAB — CBG MONITORING, ED
GLUCOSE-CAPILLARY: 152 mg/dL — AB (ref 65–99)
GLUCOSE-CAPILLARY: 270 mg/dL — AB (ref 65–99)
GLUCOSE-CAPILLARY: 350 mg/dL — AB (ref 65–99)
Glucose-Capillary: 161 mg/dL — ABNORMAL HIGH (ref 65–99)

## 2015-08-16 MED ORDER — BENZTROPINE MESYLATE 1 MG PO TABS
0.5000 mg | ORAL_TABLET | Freq: Two times a day (BID) | ORAL | Status: DC
  Administered 2015-08-16 – 2015-08-18 (×4): 0.5 mg via ORAL
  Filled 2015-08-16 (×4): qty 1

## 2015-08-16 MED ORDER — OLANZAPINE 5 MG PO TABS
2.5000 mg | ORAL_TABLET | Freq: Two times a day (BID) | ORAL | Status: DC
  Administered 2015-08-16 – 2015-08-18 (×4): 2.5 mg via ORAL
  Filled 2015-08-16 (×4): qty 1

## 2015-08-16 NOTE — ED Notes (Signed)
Family currently aiding pt with eating, denied need of assistance. Pt has to be prompted multiple times to answer questions, flat affect, and blank stare.

## 2015-08-16 NOTE — ED Notes (Signed)
Pt remains sleeping. Pt husband requests we allow patient to rest/sleep as much as possible due to her insomnia over the past 3 days. Breakfast tray at bedside and RN called pharmacy to request 0800 insulin dose. RN spoke with Husband, [name], at bedside and he requested insulin not be given until after patient has eat a meal, RN assured husband no insulin will be given until pt taking po well. RN updated husband that [facility] is continuing to seek geropsychiatry inpatient placement.

## 2015-08-16 NOTE — ED Notes (Signed)
Pt ambulated to restroom with assistance X1, had to have multiple verbal cues. Gait was slow and shuffling.

## 2015-08-16 NOTE — ED Notes (Addendum)
Pt sleeping. Pt husband requests not to wake patient until lunch tray arrives and attempt po medications at that time.   RN verified with [facility] seeking inpatient placement.

## 2015-08-16 NOTE — Consult Note (Signed)
Consult Note for Medication Management   Per tele-assessment note-Rachel [name] is a 65 y.o. female who presents voluntarily to [facility], accompanied by her husband, [name], due to altered mental status bought on by lack of sleep. Pt is a poor historian and not completely oriented. When asked if she knows why she is at the ED, pt replied "checking on my kidney". Pt presents as near catatonic and answers most questions with "I don't know" or something unintelligible. Pt's husband is the primary historian for this assessment. Husband reports that pt has not slept in 3 days. Husband indicates that pt will stay up all night telling him of VH she is experiencing and having delusional type speech. Pt has a hx of paranoid schizophrenia and is followed by an OP psychiatrist. Husband states that pt has been taking all of her medication as prescribed and there had been no recent medication changes.   Diagnosis: Schizophrenia, paranoid type  Past Medical History:  Past Medical History  Diagnosis Date  . Diabetes mellitus type I ([place])   . Hyperlipidemia   . Vertigo   . GERD (gastroesophageal reflux disease)   . Systemic hypertension   . Paroxysmal atrial fibrillation (HCC)   . Schizophrenia ([place])   . Blindness     Past Surgical History  Procedure Laterality Date  . Brain tumor excision    . US echocardiography  12/20/2003    mild mitral annular ca+,mild MR,TR,PI,AOV mildly sclerotic  . Nm myocar perf wall motion  02/01/2007    no significant ischemia    Family History:  Family History  Problem Relation Age of Onset  . ADD / ADHD Neg Hx   . Alcohol abuse Neg Hx   . Drug abuse Neg Hx   . Anxiety disorder Neg Hx   . Bipolar disorder Neg Hx   . Dementia Neg Hx   . Depression Neg Hx   . OCD Neg Hx   . Paranoid behavior Neg Hx   . Schizophrenia Neg Hx   . Seizures Neg Hx   . Sexual  abuse Neg Hx   . Physical abuse Neg Hx     Social History:  reports that she has never smoked. She has never used smokeless tobacco. She reports that she does not drink alcohol or use illicit drugs.  Additional Social History: Alcohol / Drug Use Pain Medications: see PTA meds Prescriptions: see PTA meds Over the Counter: see PTA meds History of alcohol / drug use?: No history of alcohol / drug abuse  CIWA: CIWA-Ar BP: 141/73 mmHg Pulse Rate: (!) 56 COWS:    PATIENT STRENGTHS: (choose at least two) Average or above average intelligence Motivation for treatment/growth Supportive family/friends  Allergies:  Allergies  Allergen Reactions  . Sulfa Antibiotics Anaphylaxis  . Ibuprofen Other (See Comments)    unknown          I agree with current treatment plan on 08/16/2015- Psychiatric evaluation follow-up, chart reviewed and case discussed with the MD Dwyane Dee, [facility] and Treatment team. Medication management adjustment made. Reviewed the information documented and agree with the treatment plan.   Treatment Plan Summary: Daily contact with patient to assess and evaluate symptoms and progress in treatment, Medication management and Plan alcohol induced mood disorder:  -Crisis stabilization  -Medication management:  Continue Xanax 0.25mg  PO TID and Trazodone 75 mg PO Q HS. Discontinue Risperdal 2 mg PO QHS Start Zyprexa 2.5mg  PO BID (with titration) Start cogentin 0.5mg  PO BID for ESP -Individual and substance abuse  counseling Daily contact with patient to assess and evaluate symptoms and progress in treatment and Medication management

## 2015-08-16 NOTE — ED Notes (Signed)
Patient given total bath this morning. Patient resting at this time with eyes closed no distress noted.

## 2015-08-16 NOTE — ED Notes (Signed)
Pt continues to sleep. Husband at bedside

## 2015-08-16 NOTE — [facility] Counselor (Signed)
Referrals faxed to: [facility], [place] [facility]

## 2015-08-16 NOTE — ED Notes (Signed)
Pt is being fed lunch by husband or family member.

## 2015-08-16 NOTE — ED Notes (Signed)
Pt walked to the bathroom and did very well, given verbal cues.

## 2015-08-16 NOTE — ED Notes (Signed)
Pt is resting with eyes closed, appears to be in no distress. Respirations are even and unlabored. Family is at bedside. AC called to bring pt a dinner meal.

## 2015-08-17 LAB — CBG MONITORING, ED
GLUCOSE-CAPILLARY: 320 mg/dL — AB (ref 65–99)
Glucose-Capillary: 139 mg/dL — ABNORMAL HIGH (ref 65–99)

## 2015-08-17 NOTE — ED Notes (Signed)
Called pharmacy regarding missing medication. Will send to ED.

## 2015-08-17 NOTE — ED Notes (Signed)
Pt assisted to restroom with assistance X1, required cues. Shuffling and slow gait. Pt becoming more verbal, requesting restroom and beverage. Denies any pain. Room and linens cleaned, pt put into new scrub pants.

## 2015-08-17 NOTE — ED Notes (Signed)
Husband at bedside, supportive.

## 2015-08-17 NOTE — ED Notes (Signed)
Pt denies any needs at this time, has been prompted about restroom, snack, or beverage.

## 2015-08-17 NOTE — ED Notes (Signed)
Pt resting with eyes closed, appears to be in no distress. Respirations are even and unlabored.  

## 2015-08-17 NOTE — ED Notes (Signed)
Pt ambulated to bathroom with assistance of husband, pt directed by verbal cues and had shuffling gait.  Pt currently back in bed eating with assistance of husband. Pt alert, not oriented to place or situation.

## 2015-08-17 NOTE — ED Notes (Signed)
MD Mesner notified of CBG of 270, no further orders.

## 2015-08-17 NOTE — ED Notes (Signed)
MD Mesner notified of CBG of 350. Give ordered insulin and check CBG in 1-2 hours.

## 2015-08-18 LAB — CBG MONITORING, ED
GLUCOSE-CAPILLARY: 109 mg/dL — AB (ref 65–99)
GLUCOSE-CAPILLARY: 206 mg/dL — AB (ref 65–99)

## 2015-08-18 NOTE — ED Notes (Signed)
Spoke with Rachel [name] at this time she did receive POA paperwork at this time . Rachel [name]

## 2015-08-18 NOTE — ED Provider Notes (Signed)
Patient accepted to [facility]. Accepting is Dr. Geanie Kenning.  Sherwood Gambler, MD 08/18/15 [phone]

## 2015-08-18 NOTE — ED Notes (Signed)
Frankey Poot with [facility] [phone] called in reference to husband having POA or guardianship over pt if so bed available at [facility] aware of same st this time . Lattie Haw

## 2015-08-18 NOTE — ED Notes (Signed)
Faxed POA paperwork to [facility] at this time Attention to [facility] in the intake Dept. [phone] intake # [phone] Rachel [name]

## 2015-08-18 NOTE — ED Notes (Signed)
Patient ambulatory to restroom with assistance of husband.  

## 2015-08-18 NOTE — ED Notes (Signed)
In with breakfast tray.   Pt states she is not hungry.

## 2015-08-18 NOTE — ED Notes (Signed)
Patient assisted to restroom without issues

## 2015-08-18 NOTE — ED Notes (Signed)
Patient ambulatory to restroom with husband assistance.

## 2015-08-18 NOTE — ED Notes (Signed)
Per Shirlee Limerick at [facility] pt was accepted at this time by [facility] jones at this time call report and transport after 7 pm . Per Shirlee Limerick report can be called to [phone]. Lattie Haw

## 2015-08-18 NOTE — ED Notes (Signed)
Accepted at [facility]. Call report to [phone] Shirlee Limerick).  Fax: [phone].

## 2015-08-18 NOTE — ED Notes (Signed)
Eating dinner tray  °

## 2015-08-26 ENCOUNTER — Ambulatory Visit (INDEPENDENT_AMBULATORY_CARE_PROVIDER_SITE_OTHER): Payer: BLUE CROSS/BLUE SHIELD | Admitting: Psychiatry

## 2015-08-26 ENCOUNTER — Encounter (HOSPITAL_COMMUNITY): Payer: Self-pay | Admitting: Psychiatry

## 2015-08-26 VITALS — BP 133/80 | HR 78 | Ht 68.0 in | Wt 174.0 lb

## 2015-08-26 DIAGNOSIS — F200 Paranoid schizophrenia: Secondary | ICD-10-CM | POA: Diagnosis not present

## 2015-08-26 MED ORDER — TRAZODONE HCL 100 MG PO TABS: 100.0000 mg | ORAL_TABLET | Freq: Every day | ORAL | Status: DC

## 2015-08-26 MED ORDER — PAROXETINE HCL 40 MG PO TABS
40.0000 mg | ORAL_TABLET | Freq: Every day | ORAL | Status: DC
Start: 2015-08-26 — End: 2015-12-19

## 2015-08-26 MED ORDER — ALPRAZOLAM 0.25 MG PO TABS: 0.2500 mg | ORAL_TABLET | Freq: Three times a day (TID) | ORAL | Status: DC

## 2015-08-26 MED ORDER — RISPERIDONE 2 MG PO TABS: 2.0000 mg | ORAL_TABLET | Freq: Every day | ORAL | Status: DC

## 2015-08-26 NOTE — Progress Notes (Signed)
Patient ID: Rachel [name], female   DOB: 1950-05-23, 65 y.o.   MRN: [id number] Patient ID: Rachel [name], female   DOB: 09-02-1950, 65 y.o.   MRN: [id number] Patient ID: Rachel [name], female   DOB: 12/09/1950, 65 y.o.   MRN: [id number] Patient ID: Rachel [name], female   DOB: 02-18-1950, 65 y.o.   MRN: [id number] Patient ID: Rachel [name], female   DOB: [birth year], 65 y.o.   MRN: [id number] Patient ID: Rachel [name], female   DOB: 09/27/1950, 65 y.o.   MRN: [id number] Patient ID: Rachel [name], female   DOB: 08-10-1950, 65 y.o.   MRN: [id number] Patient ID: Rachel [name], female   DOB: 24-Jul-1950, 65 y.o.   MRN: [id number] Patient ID: Rachel [name], female   DOB: 02/09/1951, 65 y.o.   MRN: [id number] Patient ID: Rachel [name], female   DOB: 05/07/1950, 65 y.o.   MRN: [id number] Patient ID: Rachel [name], female   DOB: 09-02-1950, 65 y.o.   MRN: [id number] Patient ID: Rachel [name], female   DOB: [birth year], 65 y.o.   MRN: [id number] Patient ID: Rachel [name], female   DOB: 04-26-1950, 65 y.o.   MRN: [id number] Patient ID: Rachel [name], female   DOB: [birth year], 65 y.o.   MRN: [id number] [facility] [id number] Progress Note Rachel [name] MRN: [id number] DOB: 01-Jul-1950 Age: 65 y.o.  Date: 08/26/2015 Start Time: 1:18 PM End Time: 1:53 PM  Chief Complaint: Chief Complaint  Patient presents with  . Schizophrenia  . Follow-up   Subjective: "She Was in the hospital." This patient is a 65 year old married black female lives with her husband in [place]. She has not worked in years but is not on disability. She is a very poor historian   Apparently she has had mental illness for many years. At one point she was depressed but also had psychotic symptoms that sound congruent with schizophrenia. Her husband thinks she is doing pretty well on her current medications but she tends to sleep too much during the  day. We've looked at her med list and found that her trazodone might be too high. She does better if she only takes 100 mg instead of 200 mg. She also takes meclizine in the morning which may be contributing to drowsiness. Neither one of them remember why she is on lthis.  The patient denies being depressed but her energy is poor and she is drowsy. She doesn't sleep well she'll start to have visual hallucinations at night. This happens if she sleeps throughout the whole day. She denies auditory hallucinations or paranoia.  The patient returns after 3 months with her husband. She was in the hospital at [facility] last week because she hadn't slept in 3 days and began to have visual hallucinations. Interestingly none of her psychiatric medications were changed. She got out of the hospital last Friday and is slept pretty well over the weekend but is having more trouble initiating sleep. She is still taking trazodone 75 mg at bedtime and I suggested increasing it 200 mg and her husband concurs. She denies any hallucinations today and does not voice any delusions. As usual she is very blunted.      Past psychiatric history Patient has been seeing in this office since 1998.  She was admitted at [facility] due to severe depression and psychotic features.  At that time she was having suicidal thoughts .  Since then she has been stable on current psychiatric  medication. Allergies: Allergies  Allergen Reactions  . Sulfa Antibiotics Anaphylaxis  . Ibuprofen Other (See Comments)    unknown  Medical History: Past Medical History  Diagnosis Date  . Diabetes mellitus type I ([place])   . Hyperlipidemia   . Vertigo   . GERD (gastroesophageal reflux disease)   . Systemic hypertension   . Paroxysmal atrial fibrillation (HCC)   . Schizophrenia ([place])   . Blindness   Patient has history of vertigo, GERD, hyperlipidemia, diabetes mellitus.  Patient is also legally blind.  She see Dr. Karren Burly for her  annual checkup and will follow up for elevated Hemo A1c with him. Surgical History: Past Surgical History  Procedure Laterality Date  . Brain tumor excision    . US echocardiography  12/20/2003    mild mitral annular ca+,mild MR,TR,PI,AOV mildly sclerotic  . Nm myocar perf wall motion  02/01/2007    no significant ischemia  Reviewed during this appointment. Vitals: BP 133/80 mmHg  Pulse 78  Ht 5\' 8"  (1.727 m)  Wt 174 lb (78.926 kg)  BMI 26.46 kg/m2  SpO2 94% Wt down almost 3 pounds.  Mental status examination Patient is casually dressed and fairly groomed. Patient is legally blind and need her husband`s  assistance for walking. She described her mood as ok.  She is calm and not at all fidgety today. Her affect is blunted. Her speech is sparse and she answers in monosyllables She denies any active or passive suicidal thinking and homicidal thinking. Her thought process is  Negative for paranoid statementsShe is very blunted . She denies any auditory or visual hallucination.  She has poverty of thought content.  She's alert and oriented x3 her insight judgment and impulse control is okay. There no tremors shakes or extrapyramidal side effects noted.  Lab Results:  Results for orders placed or performed during the hospital encounter of 08/15/15 (from the past 8736 hour(s))  Urinalysis, Routine w reflex microscopic (not at [facility])   Collection Time: 08/15/15  1:27 PM  Result Value Ref Range   Color, Urine YELLOW YELLOW   APPearance CLEAR CLEAR   Specific Gravity, Urine 1.010 1.005 - 1.030   pH 7.5 5.0 - 8.0   Glucose, UA NEGATIVE NEGATIVE mg/dL   Hgb urine dipstick SMALL (A) NEGATIVE   Bilirubin Urine NEGATIVE NEGATIVE   Ketones, ur NEGATIVE NEGATIVE mg/dL   Protein, ur NEGATIVE NEGATIVE mg/dL   Nitrite NEGATIVE NEGATIVE   Leukocytes, UA TRACE (A) NEGATIVE  Urine rapid drug screen (hosp performed)   Collection Time: 08/15/15  1:27 PM  Result Value Ref Range   Opiates NONE DETECTED  NONE DETECTED   Cocaine NONE DETECTED NONE DETECTED   Benzodiazepines POSITIVE (A) NONE DETECTED   Amphetamines NONE DETECTED NONE DETECTED   Tetrahydrocannabinol NONE DETECTED NONE DETECTED   Barbiturates NONE DETECTED NONE DETECTED  Urine microscopic-add on   Collection Time: 08/15/15  1:27 PM  Result Value Ref Range   Squamous Epithelial / LPF 0-5 (A) NONE SEEN   WBC, UA 0-5 0 - 5 WBC/hpf   RBC / HPF 0-5 0 - 5 RBC/hpf   Bacteria, UA RARE (A) NONE SEEN  Comprehensive metabolic panel   Collection Time: 08/15/15  2:01 PM  Result Value Ref Range   Sodium 136 135 - 145 mmol/L   Potassium 3.5 3.5 - 5.1 mmol/L   Chloride 106 101 - 111 mmol/L   CO2 24 22 - 32 mmol/L   Glucose, Bld 145 (H) 65 - 99 mg/dL  BUN 6 6 - 20 mg/dL   Creatinine, Ser 0.72 0.44 - 1.00 mg/dL   Calcium 9.6 8.9 - 10.3 mg/dL   Total Protein 7.1 6.5 - 8.1 g/dL   Albumin 3.7 3.5 - 5.0 g/dL   AST 15 15 - 41 U/L   ALT 13 (L) 14 - 54 U/L   Alkaline Phosphatase 58 38 - 126 U/L   Total Bilirubin 0.5 0.3 - 1.2 mg/dL   GFR calc non Af Amer >60 >60 mL/min   GFR calc Af Amer >60 >60 mL/min   Anion gap 6 5 - 15  CBC   Collection Time: 08/15/15  2:01 PM  Result Value Ref Range   WBC 7.7 4.0 - 10.5 K/uL   RBC 4.29 3.87 - 5.11 MIL/uL   Hemoglobin 12.8 12.0 - 15.0 g/dL   HCT 38.3 36.0 - 46.0 %   MCV 89.3 78.0 - 100.0 fL   MCH 29.8 26.0 - 34.0 pg   MCHC 33.4 30.0 - 36.0 g/dL   RDW 12.3 11.5 - 15.5 %   Platelets 233 150 - 400 K/uL  Digoxin level   Collection Time: 08/15/15  2:01 PM  Result Value Ref Range   Digoxin Level 0.4 (L) 0.8 - 2.0 ng/mL  Ethanol   Collection Time: 08/15/15  2:27 PM  Result Value Ref Range   Alcohol, Ethyl (B) <5 <5 mg/dL  CBG monitoring, ED   Collection Time: 08/15/15 11:02 PM  Result Value Ref Range   Glucose-Capillary 226 (H) 65 - 99 mg/dL   Comment 1 Notify RN    Comment 2 Document in Chart   CBG monitoring, ED   Collection Time: 08/16/15  6:02 AM  Result Value Ref Range    Glucose-Capillary 161 (H) 65 - 99 mg/dL   Comment 1 Notify RN    Comment 2 Document in Chart   CBG monitoring, ED   Collection Time: 08/16/15  7:18 AM  Result Value Ref Range   Glucose-Capillary 152 (H) 65 - 99 mg/dL  CBG monitoring, ED   Collection Time: 08/16/15  9:48 PM  Result Value Ref Range   Glucose-Capillary 350 (H) 65 - 99 mg/dL  CBG monitoring, ED   Collection Time: 08/16/15 11:42 PM  Result Value Ref Range   Glucose-Capillary 270 (H) 65 - 99 mg/dL  CBG monitoring, ED   Collection Time: 08/17/15  7:55 AM  Result Value Ref Range   Glucose-Capillary 139 (H) 65 - 99 mg/dL  CBG monitoring, ED   Collection Time: 08/17/15  9:58 PM  Result Value Ref Range   Glucose-Capillary 320 (H) 65 - 99 mg/dL  CBG monitoring, ED   Collection Time: 08/18/15  9:32 AM  Result Value Ref Range   Glucose-Capillary 109 (H) 65 - 99 mg/dL  CBG monitoring, ED   Collection Time: 08/18/15  6:22 PM  Result Value Ref Range   Glucose-Capillary 206 (H) 65 - 99 mg/dL   Comment 1 Document in Chart    Assessment Axis Ischizophrenia Axis II deferred Axis III see medical history Axis IV mild to moderate  Axis V 60 Plan: I took her vitals.  I reviewed CC, tobacco/med/surg Hx, meds effects/ side effects, problem list, therapies and responses as well as current situation/symptoms discussed options. She'll continue Paxil at 40 mg each bedtime for depression.  trazodone Will be increased to 100 mg each bedtime for sleep and continue Risperdal 2 mg each bedtime for schizophrenia.. She'll continue Xanax as needed for anxiety and return to see  me in 4 weeks Her husband will call me if her situation worsens See orders and pt instructions for more details.  MEDICATIONS this encounter: Meds ordered this encounter  Medications  . risperiDONE (RISPERDAL) 2 MG tablet    Sig: Take 1 tablet (2 mg total) by mouth at bedtime.    Dispense:  90 tablet    Refill:  2  . PARoxetine (PAXIL) 40 MG tablet    Sig: Take 1  tablet (40 mg total) by mouth daily.    Dispense:  90 tablet    Refill:  2  . traZODone (DESYREL) 100 MG tablet    Sig: Take 1 tablet (100 mg total) by mouth at bedtime.    Dispense:  30 tablet    Refill:  2  . ALPRAZolam (XANAX) 0.25 MG tablet    Sig: Take 1 tablet (0.25 mg total) by mouth 3 (three) times daily.    Dispense:  270 tablet    Refill:  1    Medical Decision Making Problem Points:  Established problem, stable/improving (1), Established problem, worsening (2), Review of last therapy session (1) and Review of psycho-social stressors (1) Data Points:  Review or order medicine tests (1) Review of medication regiment & side effects (2) Review of new medications or change in dosage (2)  I certify that outpatient services furnished can reasonably be expected to improve the patient's condition.   Levonne Spiller, MD

## 2015-09-17 ENCOUNTER — Inpatient Hospital Stay (HOSPITAL_COMMUNITY)
Admission: EM | Admit: 2015-09-17 | Discharge: 2015-09-26 | DRG: 085 | Disposition: A | Discharge status: Skilled Nursing Facility | Payer: BLUE CROSS/BLUE SHIELD | Attending: Neurology | Admitting: Neurology

## 2015-09-17 ENCOUNTER — Emergency Department (HOSPITAL_COMMUNITY): Payer: BLUE CROSS/BLUE SHIELD

## 2015-09-17 ENCOUNTER — Inpatient Hospital Stay (HOSPITAL_COMMUNITY): Payer: BLUE CROSS/BLUE SHIELD

## 2015-09-17 ENCOUNTER — Encounter (HOSPITAL_COMMUNITY): Payer: Self-pay | Admitting: Emergency Medicine

## 2015-09-17 DIAGNOSIS — E785 Hyperlipidemia, unspecified: Secondary | ICD-10-CM | POA: Diagnosis present

## 2015-09-17 DIAGNOSIS — R4189 Other symptoms and signs involving cognitive functions and awareness: Secondary | ICD-10-CM | POA: Diagnosis present

## 2015-09-17 DIAGNOSIS — H540 Blindness, both eyes: Secondary | ICD-10-CM | POA: Diagnosis present

## 2015-09-17 DIAGNOSIS — G8191 Hemiplegia, unspecified affecting right dominant side: Secondary | ICD-10-CM | POA: Diagnosis present

## 2015-09-17 DIAGNOSIS — Z6828 Body mass index (BMI) 28.0-28.9, adult: Secondary | ICD-10-CM | POA: Diagnosis not present

## 2015-09-17 DIAGNOSIS — I10 Essential (primary) hypertension: Secondary | ICD-10-CM | POA: Diagnosis present

## 2015-09-17 DIAGNOSIS — I480 Paroxysmal atrial fibrillation: Secondary | ICD-10-CM | POA: Diagnosis present

## 2015-09-17 DIAGNOSIS — Z794 Long term (current) use of insulin: Secondary | ICD-10-CM | POA: Diagnosis not present

## 2015-09-17 DIAGNOSIS — E1065 Type 1 diabetes mellitus with hyperglycemia: Secondary | ICD-10-CM | POA: Diagnosis present

## 2015-09-17 DIAGNOSIS — S066X0A Traumatic subarachnoid hemorrhage without loss of consciousness, initial encounter: Secondary | ICD-10-CM

## 2015-09-17 DIAGNOSIS — G8194 Hemiplegia, unspecified affecting left nondominant side: Secondary | ICD-10-CM

## 2015-09-17 DIAGNOSIS — R29810 Facial weakness: Secondary | ICD-10-CM | POA: Diagnosis present

## 2015-09-17 DIAGNOSIS — R402214 Coma scale, best verbal response, none, 24 hours or more after hospital admission: Secondary | ICD-10-CM | POA: Diagnosis not present

## 2015-09-17 DIAGNOSIS — R402114 Coma scale, eyes open, never, 24 hours or more after hospital admission: Secondary | ICD-10-CM | POA: Diagnosis not present

## 2015-09-17 DIAGNOSIS — D763 Other histiocytosis syndromes: Secondary | ICD-10-CM | POA: Diagnosis not present

## 2015-09-17 DIAGNOSIS — Z0000 Encounter for general adult medical examination without abnormal findings: Secondary | ICD-10-CM

## 2015-09-17 DIAGNOSIS — R402314 Coma scale, best motor response, none, 24 hours or more after hospital admission: Secondary | ICD-10-CM | POA: Diagnosis not present

## 2015-09-17 DIAGNOSIS — Z91013 Allergy to seafood: Secondary | ICD-10-CM | POA: Diagnosis not present

## 2015-09-17 DIAGNOSIS — Z888 Allergy status to other drugs, medicaments and biological substances status: Secondary | ICD-10-CM | POA: Diagnosis not present

## 2015-09-17 DIAGNOSIS — E119 Type 2 diabetes mellitus without complications: Secondary | ICD-10-CM | POA: Diagnosis not present

## 2015-09-17 DIAGNOSIS — W19XXXA Unspecified fall, initial encounter: Secondary | ICD-10-CM | POA: Diagnosis present

## 2015-09-17 DIAGNOSIS — Z833 Family history of diabetes mellitus: Secondary | ICD-10-CM

## 2015-09-17 DIAGNOSIS — Z7982 Long term (current) use of aspirin: Secondary | ICD-10-CM | POA: Diagnosis not present

## 2015-09-17 DIAGNOSIS — Z23 Encounter for immunization: Secondary | ICD-10-CM

## 2015-09-17 DIAGNOSIS — Z882 Allergy status to sulfonamides status: Secondary | ICD-10-CM | POA: Diagnosis not present

## 2015-09-17 DIAGNOSIS — H547 Unspecified visual loss: Secondary | ICD-10-CM

## 2015-09-17 DIAGNOSIS — E663 Overweight: Secondary | ICD-10-CM | POA: Diagnosis present

## 2015-09-17 DIAGNOSIS — F209 Schizophrenia, unspecified: Secondary | ICD-10-CM

## 2015-09-17 DIAGNOSIS — R29898 Other symptoms and signs involving the musculoskeletal system: Secondary | ICD-10-CM

## 2015-09-17 DIAGNOSIS — Z9181 History of falling: Secondary | ICD-10-CM

## 2015-09-17 DIAGNOSIS — G939 Disorder of brain, unspecified: Secondary | ICD-10-CM | POA: Diagnosis not present

## 2015-09-17 DIAGNOSIS — G9340 Encephalopathy, unspecified: Secondary | ICD-10-CM | POA: Diagnosis present

## 2015-09-17 DIAGNOSIS — Z8249 Family history of ischemic heart disease and other diseases of the circulatory system: Secondary | ICD-10-CM

## 2015-09-17 DIAGNOSIS — K219 Gastro-esophageal reflux disease without esophagitis: Secondary | ICD-10-CM | POA: Diagnosis present

## 2015-09-17 DIAGNOSIS — I454 Nonspecific intraventricular block: Secondary | ICD-10-CM | POA: Diagnosis not present

## 2015-09-17 DIAGNOSIS — Z881 Allergy status to other antibiotic agents status: Secondary | ICD-10-CM | POA: Diagnosis not present

## 2015-09-17 DIAGNOSIS — I609 Nontraumatic subarachnoid hemorrhage, unspecified: Secondary | ICD-10-CM | POA: Diagnosis present

## 2015-09-17 DIAGNOSIS — S066X0S Traumatic subarachnoid hemorrhage without loss of consciousness, sequela: Secondary | ICD-10-CM | POA: Diagnosis not present

## 2015-09-17 DIAGNOSIS — G9389 Other specified disorders of brain: Secondary | ICD-10-CM

## 2015-09-17 LAB — CBC WITH DIFFERENTIAL/PLATELET
BASOS ABS: 0.1 10*3/uL (ref 0.0–0.1)
BASOS PCT: 1 %
EOS ABS: 0.1 10*3/uL (ref 0.0–0.7)
EOS PCT: 1 %
HEMATOCRIT: 42 % (ref 36.0–46.0)
HEMOGLOBIN: 13.9 g/dL (ref 12.0–15.0)
Lymphocytes Relative: 18 %
Lymphs Abs: 1.9 10*3/uL (ref 0.7–4.0)
MCH: 30.2 pg (ref 26.0–34.0)
MCHC: 33.1 g/dL (ref 30.0–36.0)
MCV: 91.3 fL (ref 78.0–100.0)
MONO ABS: 0.8 10*3/uL (ref 0.1–1.0)
MONOS PCT: 8 %
Neutro Abs: 7.3 10*3/uL (ref 1.7–7.7)
Neutrophils Relative %: 72 %
Platelets: 255 10*3/uL (ref 150–400)
RBC: 4.6 MIL/uL (ref 3.87–5.11)
RDW: 12.8 % (ref 11.5–15.5)
WBC: 10.2 10*3/uL (ref 4.0–10.5)

## 2015-09-17 LAB — COMPREHENSIVE METABOLIC PANEL
ALBUMIN: 3.8 g/dL (ref 3.5–5.0)
ALK PHOS: 56 U/L (ref 38–126)
ALT: 15 U/L (ref 14–54)
AST: 19 U/L (ref 15–41)
Anion gap: 7 (ref 5–15)
BUN: 6 mg/dL (ref 6–20)
CALCIUM: 10.2 mg/dL (ref 8.9–10.3)
CO2: 25 mmol/L (ref 22–32)
CREATININE: 0.89 mg/dL (ref 0.44–1.00)
Chloride: 105 mmol/L (ref 101–111)
GFR calc non Af Amer: >60 mL/min (ref >60)
GLUCOSE: 266 mg/dL — AB (ref 65–99)
Potassium: 3.9 mmol/L (ref 3.5–5.1)
SODIUM: 137 mmol/L (ref 135–145)
Total Bilirubin: 0.5 mg/dL (ref 0.3–1.2)
Total Protein: 6.7 g/dL (ref 6.5–8.1)

## 2015-09-17 LAB — PROTIME-INR
INR: 1.01
PROTHROMBIN TIME: 13.3 s (ref 11.4–15.2)

## 2015-09-17 LAB — URINALYSIS, ROUTINE W REFLEX MICROSCOPIC
BILIRUBIN URINE: NEGATIVE
Glucose, UA: >1000 mg/dL — AB
KETONES UR: 15 mg/dL — AB
Leukocytes, UA: NEGATIVE
Nitrite: NEGATIVE
PH: 5 (ref 5.0–8.0)
Protein, ur: 30 mg/dL — AB
SPECIFIC GRAVITY, URINE: 1.029 (ref 1.005–1.030)

## 2015-09-17 LAB — URINE MICROSCOPIC-ADD ON

## 2015-09-17 LAB — I-STAT TROPONIN, ED: TROPONIN I, POC: 0 ng/mL (ref 0.00–0.08)

## 2015-09-17 LAB — DIGOXIN LEVEL: Digoxin Level: 0.6 ng/mL — ABNORMAL LOW (ref 0.8–2.0)

## 2015-09-17 MED ORDER — GADOBENATE DIMEGLUMINE 529 MG/ML IV SOLN
18.0000 mL | Freq: Once | INTRAVENOUS | Status: AC | PRN
  Administered 2015-09-17: 18 mL via INTRAVENOUS

## 2015-09-17 MED ORDER — PNEUMOCOCCAL VAC POLYVALENT 25 MCG/0.5ML IJ INJ
0.5000 mL | INJECTION | INTRAMUSCULAR | Status: AC
  Administered 2015-09-18: 0.5 mL via INTRAMUSCULAR
  Filled 2015-09-17: qty 0.5

## 2015-09-17 MED ORDER — PANTOPRAZOLE SODIUM 40 MG PO TBEC
40.0000 mg | DELAYED_RELEASE_TABLET | Freq: Every day | ORAL | Status: DC
  Administered 2015-09-18: 40 mg via ORAL
  Filled 2015-09-17: qty 1

## 2015-09-17 MED ORDER — SODIUM CHLORIDE 0.9 % IV SOLN: INTRAVENOUS | Status: DC

## 2015-09-17 MED ORDER — ACETAMINOPHEN 325 MG PO TABS
650.0000 mg | ORAL_TABLET | ORAL | Status: DC | PRN
  Administered 2015-09-23: 650 mg via ORAL
  Filled 2015-09-17: qty 2

## 2015-09-17 MED ORDER — SENNOSIDES-DOCUSATE SODIUM 8.6-50 MG PO TABS
1.0000 | ORAL_TABLET | Freq: Two times a day (BID) | ORAL | Status: DC
  Administered 2015-09-17 – 2015-09-26 (×17): 1 via ORAL
  Filled 2015-09-17 (×18): qty 1

## 2015-09-17 MED ORDER — NICARDIPINE HCL IN NACL 20-0.86 MG/200ML-% IV SOLN: 0.0000 mg/h | INTRAVENOUS | Status: DC

## 2015-09-17 MED ORDER — STROKE: EARLY STAGES OF RECOVERY BOOK
Freq: Once | Status: AC
  Administered 2015-09-17: 23:00:00
  Filled 2015-09-17: qty 1

## 2015-09-17 MED ORDER — ALPRAZOLAM 0.25 MG PO TABS
0.2500 mg | ORAL_TABLET | Freq: Three times a day (TID) | ORAL | Status: DC
  Administered 2015-09-17 – 2015-09-26 (×24): 0.25 mg via ORAL
  Filled 2015-09-17 (×25): qty 1

## 2015-09-17 MED ORDER — LABETALOL HCL 5 MG/ML IV SOLN: 20.0000 mg | Freq: Once | INTRAVENOUS | Status: DC

## 2015-09-17 MED ORDER — TRAZODONE HCL 50 MG PO TABS
75.0000 mg | ORAL_TABLET | Freq: Every day | ORAL | Status: DC
  Administered 2015-09-17 – 2015-09-18 (×2): 75 mg via ORAL
  Filled 2015-09-17 (×2): qty 1
  Filled 2015-09-17: qty 2

## 2015-09-17 MED ORDER — SODIUM CHLORIDE 0.9 % IV SOLN
INTRAVENOUS | Status: DC
  Administered 2015-09-17: 23:00:00 via INTRAVENOUS

## 2015-09-17 MED ORDER — RISPERIDONE 0.5 MG PO TABS
2.0000 mg | ORAL_TABLET | Freq: Every day | ORAL | Status: DC
  Administered 2015-09-17 – 2015-09-25 (×8): 2 mg via ORAL
  Filled 2015-09-17: qty 4
  Filled 2015-09-17: qty 1
  Filled 2015-09-17 (×6): qty 4
  Filled 2015-09-17: qty 1
  Filled 2015-09-17: qty 4

## 2015-09-17 MED ORDER — ACETAMINOPHEN 650 MG RE SUPP: 650.0000 mg | RECTAL | Status: DC | PRN

## 2015-09-17 MED ORDER — PAROXETINE HCL 20 MG PO TABS
40.0000 mg | ORAL_TABLET | Freq: Every day | ORAL | Status: DC
  Administered 2015-09-17 – 2015-09-25 (×8): 40 mg via ORAL
  Filled 2015-09-17 (×10): qty 2

## 2015-09-17 NOTE — ED Triage Notes (Signed)
Husband stated, shes had some leg weakness for the last 2 days, and she gets tired real easy.

## 2015-09-17 NOTE — H&P (Signed)
Admission H&P    Chief Complaint: Onset right-sided weakness.  HPI: Rachel [name] is an 65 y.o. female with a history of Rosai-Dorfman disease, resection of an occipital mass lesion, hypertension, paroxysmal atrial fibrillation, hyperlipidemia, schizophrenia and diabetes mellitus, presenting with new onset weakness involving right leg for 2 days. She has no previous history of stroke no TIA. She's been taking aspirin 81 mg per day. MRI of the brain showed findings indicative of acute to subacute left frontoparietal subarachnoid hemorrhage, and addition to enhancing mass lesions in the left middle cranial fossa and left lateral temporal region. Plaque-like enhancement was noted involving anterior middle cranial fossa on the right, as well as an enhancing lesion involving right optic nerve. Postsurgical changes in the occipital region noted. Patient has had no change in speech and no symptoms involving the right upper extremity. She has chronic right facial weakness which is unchanged.  LSN: 09/14/2015 tPA Given: No: Acute SAH mRankin:  Past Medical History:  Diagnosis Date  . Blindness   . Diabetes mellitus type I ([place])   . GERD (gastroesophageal reflux disease)   . Hyperlipidemia   . Paroxysmal atrial fibrillation ([facility])   . Schizophrenia ([place])   . Systemic hypertension   . Vertigo     Past Surgical History:  Procedure Laterality Date  . BRAIN TUMOR EXCISION    . NM MYOCAR PERF WALL MOTION  02/01/2007   no significant ischemia  . US ECHOCARDIOGRAPHY  12/20/2003   mild mitral annular ca+,mild MR,TR,PI,AOV mildly sclerotic    Family History  Problem Relation Age of Onset  . ADD / ADHD Neg Hx   . Alcohol abuse Neg Hx   . Drug abuse Neg Hx   . Anxiety disorder Neg Hx   . Bipolar disorder Neg Hx   . Dementia Neg Hx   . Depression Neg Hx   . OCD Neg Hx   . Paranoid behavior Neg Hx   . Schizophrenia Neg Hx   . Seizures Neg Hx   . Sexual abuse Neg Hx   . Physical abuse Neg  Hx    Social History:  reports that she has never smoked. She has never used smokeless tobacco. She reports that she does not drink alcohol or use drugs.  Allergies:  Allergies  Allergen Reactions  . Sulfa Antibiotics Anaphylaxis  . Ibuprofen Other (See Comments)    unknown    Medications: Preadmission medications were reviewed by me.  ROS: History obtained from spouse  General ROS: negative for - chills, fatigue, fever, night sweats, weight gain or weight loss Psychological ROS: negative for - behavioral disorder, hallucinations, memory difficulties, mood swings or suicidal ideation Ophthalmic ROS: Chronic blindness, right side ENT ROS: negative for - epistaxis, nasal discharge, oral lesions, sore throat, tinnitus or vertigo Allergy and Immunology ROS: negative for - hives or itchy/watery eyes Hematological and Lymphatic ROS: negative for - bleeding problems, bruising or swollen lymph nodes Endocrine ROS: negative for - galactorrhea, hair pattern changes, polydipsia/polyuria or temperature intolerance Respiratory ROS: negative for - cough, hemoptysis, shortness of breath or wheezing Cardiovascular ROS: negative for - chest pain, dyspnea on exertion, edema or irregular heartbeat Gastrointestinal ROS: negative for - abdominal pain, diarrhea, hematemesis, nausea/vomiting or stool incontinence Genito-Urinary ROS: negative for - dysuria, hematuria, incontinence or urinary frequency/urgency Musculoskeletal ROS: negative for - joint swelling or muscular weakness Neurological ROS: as noted in HPI Dermatological ROS: negative for rash and skin lesion changes  Physical Examination: Blood pressure 141/85, pulse 65, temperature 98.3  F (36.8 C), temperature source Oral, resp. rate 18, height 5' 7"  (1.702 m), weight 81.6 kg (180 lb), SpO2 100 %.  HEENT-  Normocephalic, no lesions, without obvious abnormality.  Normal external eye and conjunctiva.  Normal TM's bilaterally.  Normal auditory  canals and external ears. Normal external nose, mucus membranes and septum.  Normal pharynx. Neck supple with no masses, nodes, nodules or enlargement. Cardiovascular - regular rate and rhythm, S1, S2 normal, no murmur, click, rub or gallop Lungs - chest clear, no wheezing, rales, normal symmetric air entry, Heart exam - S1, S2 normal, no murmur, no gallop, rate regular Abdomen - soft, non-tender; bowel sounds normal; no masses,  no organomegaly Extremities - no joint deformities, effusion, or inflammation and no edema  Neurologic Examination: Mental Status: Alert, disoriented to time, no acute distress.  Speech fluent without evidence of aphasia. Able to follow commands without difficulty. Cranial Nerves: II-Visual fields were normal with testing of left eye. III/IV/VI-Pupils were equal and reacted. Extraocular movements were full and conjugate.    V/VII-no facial numbness; moderate right lower facial weakness. VIII-normal. X-normal speech. XI: trapezius strength/neck flexion strength normal bilaterally XII-midline tongue extension with normal strength. Motor: Drift of left extremities, leg greater than arm; motor exam otherwise unremarkable. Sensory: Normal throughout. Deep Tendon Reflexes: 1+ and symmetric in upper extremities and absent in lower extremities. Plantars: Flexor on the left and mute on the right Cerebellar: Normal finger-to-nose testing. Carotid auscultation: Normal  Results for orders placed or performed during the hospital encounter of 09/17/15 (from the past 48 hour(s))  Comprehensive metabolic panel     Status: Abnormal   Collection Time: 09/17/15 12:31 PM  Result Value Ref Range   Sodium 137 135 - 145 mmol/L   Potassium 3.9 3.5 - 5.1 mmol/L   Chloride 105 101 - 111 mmol/L   CO2 25 22 - 32 mmol/L   Glucose, Bld 266 (H) 65 - 99 mg/dL   BUN 6 6 - 20 mg/dL   Creatinine, Ser 0.89 0.44 - 1.00 mg/dL   Calcium 10.2 8.9 - 10.3 mg/dL   Total Protein 6.7 6.5 - 8.1 g/dL    Albumin 3.8 3.5 - 5.0 g/dL   AST 19 15 - 41 U/L   ALT 15 14 - 54 U/L   Alkaline Phosphatase 56 38 - 126 U/L   Total Bilirubin 0.5 0.3 - 1.2 mg/dL   GFR calc non Af Amer >60 >60 mL/min   GFR calc Af Amer >60 >60 mL/min    Comment: (NOTE) The eGFR has been calculated using the CKD EPI equation. This calculation has not been validated in all clinical situations. eGFR's persistently <60 mL/min signify possible Chronic Kidney Disease.    Anion gap 7 5 - 15  CBC with Differential     Status: None   Collection Time: 09/17/15 12:31 PM  Result Value Ref Range   WBC 10.2 4.0 - 10.5 K/uL   RBC 4.60 3.87 - 5.11 MIL/uL   Hemoglobin 13.9 12.0 - 15.0 g/dL   HCT 42.0 36.0 - 46.0 %   MCV 91.3 78.0 - 100.0 fL   MCH 30.2 26.0 - 34.0 pg   MCHC 33.1 30.0 - 36.0 g/dL   RDW 12.8 11.5 - 15.5 %   Platelets 255 150 - 400 K/uL   Neutrophils Relative % 72 %   Neutro Abs 7.3 1.7 - 7.7 K/uL   Lymphocytes Relative 18 %   Lymphs Abs 1.9 0.7 - 4.0 K/uL   Monocytes Relative  8 %   Monocytes Absolute 0.8 0.1 - 1.0 K/uL   Eosinophils Relative 1 %   Eosinophils Absolute 0.1 0.0 - 0.7 K/uL   Basophils Relative 1 %   Basophils Absolute 0.1 0.0 - 0.1 K/uL  I-stat troponin, ED     Status: None   Collection Time: 09/17/15  3:51 PM  Result Value Ref Range   Troponin i, poc 0.00 0.00 - 0.08 ng/mL   Comment 3            Comment: Due to the release kinetics of cTnI, a negative result within the first hours of the onset of symptoms does not rule out myocardial infarction with certainty. If myocardial infarction is still suspected, repeat the test at appropriate intervals.   Protime-INR     Status: None   Collection Time: 09/17/15  4:04 PM  Result Value Ref Range   Prothrombin Time 13.3 11.4 - 15.2 seconds   INR 1.01   Digoxin level     Status: Abnormal   Collection Time: 09/17/15  4:04 PM  Result Value Ref Range   Digoxin Level 0.6 (L) 0.8 - 2.0 ng/mL  Urinalysis, Routine w reflex microscopic (not at  [facility])     Status: Abnormal   Collection Time: 09/17/15  4:58 PM  Result Value Ref Range   Color, Urine AMBER (A) YELLOW    Comment: BIOCHEMICALS MAY BE AFFECTED BY COLOR   APPearance CLEAR CLEAR   Specific Gravity, Urine 1.029 1.005 - 1.030   pH 5.0 5.0 - 8.0   Glucose, UA >1000 (A) NEGATIVE mg/dL   Hgb urine dipstick TRACE (A) NEGATIVE   Bilirubin Urine NEGATIVE NEGATIVE   Ketones, ur 15 (A) NEGATIVE mg/dL   Protein, ur 30 (A) NEGATIVE mg/dL   Nitrite NEGATIVE NEGATIVE   Leukocytes, UA NEGATIVE NEGATIVE  Urine microscopic-add on     Status: Abnormal   Collection Time: 09/17/15  4:58 PM  Result Value Ref Range   Squamous Epithelial / LPF 6-30 (A) NONE SEEN   WBC, UA 0-5 0 - 5 WBC/hpf   RBC / HPF 0-5 0 - 5 RBC/hpf   Bacteria, UA FEW (A) NONE SEEN   Urine-Other MUCOUS PRESENT    Mr Jeri Cos Wo Contrast  Addendum Date: 09/17/2015   ADDENDUM REPORT: 09/17/2015 19:18 ADDENDUM: I discussed with Dr. Lita Mains that the abnormality involving the medial gyri of the posterior left frontal -parietal lobe (central sulcus region) is new compared to 12/04/2013 MR and not appreciated as hyperdense blood on the 07/26/2015 CT. Therefore this needs to be treated as if this represents subarachnoid hemorrhage (acute to subacute). Close follow-up MR imaging in the next 2 weeks would prove helpful. Electronically Signed   By: Genia Del M.D.   On: 09/17/2015 19:18   Result Date: 09/17/2015 CLINICAL DATA:  65 year old diabetic female with hyperlipidemia presenting with leg weakness for 2 days. Rosai-Dorfman disease. Initial encounter. EXAM: MRI HEAD WITHOUT AND WITH CONTRAST TECHNIQUE: Multiplanar, multiecho pulse sequences of the brain and surrounding structures were obtained without and with intravenous contrast. CONTRAST:  77m MULTIHANCE GADOBENATE DIMEGLUMINE 529 MG/ML IV SOLN COMPARISON:  07/26/2015 head CT.  12/05/2014 brain MR. FINDINGS: Progressive extra-axial/ dural enhancement consistent with  patient's history of Rosai-Dorfman disease. These areas are consistent with lymphoproliferative changes. Findings include: Diffuse linear dural enhancement. Anterior left middle cranial fossa 1.5 x 2.5 x 2.6 cm extra-axial enhancing mass previously measuring up to 2.2 cm. Lateral left temporal region 1 x 1.2 x 1 cm  enhancing lesion previously not visualized. Plaque-like enhancement anterior right middle cranial fossa. Involving the right cavernous sinus. Enhancement along the optic canal greater on the right with extension along the posterior aspect of the right optic nerve. Abnormality involving the medial aspect of the posterior left frontal -parietal lobe is T1 hyperintense with gradient abnormality suggesting this may represent blood or melanin. Atypical extension of lymphoproliferative changes also a consideration. As there is no loss of volume, hemorrhagic infarct is a less likely consideration. No acute thrombotic infarct seen separate from this region. Remote occipital surgery with hemorrhagic breakdown products in the operative site. Global atrophy without hydrocephalus. Major intracranial vascular structures are patent. Partially empty expanded sella. Mild prominence peri optic spaces. Minimal flattening therefore mild increased intracranial pressure not excluded. Appearance unchanged. IMPRESSION: Progressive extra-axial/ dural enhancement consistent with patient's history of Rosai-Dorfman disease. These areas are consistent with lymphoproliferative changes. Findings include: Diffuse linear dural enhancement. Anterior left middle cranial fossa 1.5 x 2.5 x 2.6 cm extra-axial enhancing mass previously measuring up to 2.2 cm. Lateral left temporal region 1 x 1.2 x 1 cm enhancing lesion previously not visualized. Plaque-like enhancement anterior right middle cranial fossa. Involving the right cavernous sinus. Enhancement along the optic canal greater on the right with extension along the posterior aspect of  the right optic nerve. Abnormality involving the medial aspect of the posterior left frontal -parietal lobe is T1 hyperintense with gradient abnormality suggesting this may represent blood or melanin. Atypical extension of lymphoproliferative changes also a consideration. As there is no loss of volume, hemorrhagic infarct is a less likely consideration. No acute thrombotic infarct seen separate from this region. Remote occipital surgery with hemorrhagic breakdown products in the operative site. Global atrophy without hydrocephalus. Partially empty sella and slightly prominent peri optic spaces. Electronically Signed: By: Genia Del M.D. On: 09/17/2015 19:02    Assessment: 65 y.o. female with multiple risk factors for stroke as well as history Rosai-Dorfman disease, presenting with acute left frontoparietal medial subarachnoid hemorrhage.  Stroke Risk Factors - atrial fibrillation, diabetes mellitus, family history, hyperlipidemia and hypertension  Plan: 1. HgbA1c, fasting lipid panel 2. MRI, MRA  of the brain without contrast 3. PT consult, OT consult, Speech consult 4. Echocardiogram 5. Carotid dopplers 6. Prophylactic therapy-None 7. Risk factor modification 8. Telemetry monitoring  C.R. Nicole Kindred, MD [facility] [phone]  09/17/2015, 8:40 PM

## 2015-09-17 NOTE — ED Provider Notes (Signed)
[facility] DEPT Provider Note   CSN: [id number] Arrival date & time: 09/17/15  1122  First Provider Contact:  First MD Initiated Contact with Patient 09/17/15 1519        History   Chief Complaint Chief Complaint  Patient presents with  . Extremity Weakness    HPI Rachel [name] is a 65 y.o. female.  HPI Level V caveat due to baseline schizophrenia. Patient presents with her husband who is primary caretaker. States that the patient has had 2-3 days of difficulty walking due to right leg weakness and being off balance. No known falls or other trauma. No recent changes to the patient's medication. Patient is at her mental baseline. No recent fever or chills. No vomiting or diarrhea. No cough. Past Medical History:  Diagnosis Date  . Blindness   . Diabetes mellitus type I ([place])   . GERD (gastroesophageal reflux disease)   . Hyperlipidemia   . Paroxysmal atrial fibrillation ([facility])   . Schizophrenia ([place])   . Systemic hypertension   . Vertigo     Patient Active Problem List   Diagnosis Date Noted  . SAH (subarachnoid hemorrhage) ([place]) 09/17/2015  . Facial droop 12/04/2013  . 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 Blind in both eyes 05/31/2013  . HTN (hypertension) 10/11/2012  . Paroxysmal atrial fibrillation ([place]) 10/11/2012  . Dyslipidemia 10/11/2012  . H/O brain tumor 10/11/2012  . Insomnia due to mental disorder 12/29/2011  . Libido, decreased 12/29/2011  . Depression 04/14/2011    Past Surgical History:  Procedure Laterality Date  . BRAIN TUMOR EXCISION    . NM MYOCAR PERF WALL MOTION  02/01/2007   no significant ischemia  . US ECHOCARDIOGRAPHY  12/20/2003   mild mitral annular ca+,mild MR,TR,PI,AOV mildly sclerotic    OB History    No data available       Home Medications    Prior to Admission medications   Medication Sig Start Date End Date Taking? Authorizing Provider  acetaminophen (TYLENOL) 500 MG tablet Take 1,000 mg by mouth every 6 (six) hours as  needed for mild pain.   Yes Historical Provider, MD  ALPRAZolam (XANAX) 0.25 MG tablet Take 1 tablet (0.25 mg total) by mouth 3 (three) times daily. 08/26/15  Yes Cloria Spring, MD  aspirin 81 MG tablet Take 81 mg by mouth daily at 6 PM.    Yes Historical Provider, MD  digoxin (LANOXIN) 0.125 MG tablet Take 1 tablet (0.125 mg total) by mouth daily. 10/31/14  Yes Mihai Croitoru, MD  diltiazem (CARDIZEM CD) 180 MG 24 hr capsule Take 1 capsule (180 mg total) by mouth daily. 10/05/14  Yes Mihai Croitoru, MD  famotidine (PEPCID) 20 MG tablet Take 20 mg by mouth daily.  01/08/11  Yes Historical Provider, MD  insulin NPH Human (HUMULIN N,NOVOLIN N) 100 UNIT/ML injection Inject 12-14 Units into the skin 2 (two) times daily. 08/23/15  Yes Historical Provider, MD  KLOR-CON 10 10 MEQ tablet Take 10 mEq by mouth daily.  04/06/11  Yes Historical Provider, MD  loratadine (CLARITIN) 10 MG tablet Take 10 mg by mouth daily as needed for allergies.  09/11/15  Yes Historical Provider, MD  meclizine (ANTIVERT) 25 MG tablet Take 25 mg by mouth daily. 07/23/13  Yes Historical Provider, MD  PARoxetine (PAXIL) 40 MG tablet Take 1 tablet (40 mg total) by mouth daily. Patient taking differently: Take 40 mg by mouth at bedtime.  08/26/15 08/25/16 Yes Cloria Spring, MD  risperiDONE (RISPERDAL) 2 MG tablet  Take 1 tablet (2 mg total) by mouth at bedtime. 08/26/15  Yes Cloria Spring, MD  rosuvastatin (CRESTOR) 10 MG tablet Take 1 tablet (10 mg total) by mouth daily. Patient taking differently: Take 10 mg by mouth at bedtime.  12/24/14  Yes Mihai Croitoru, MD  traZODone (DESYREL) 150 MG tablet Take 75 mg by mouth at bedtime. 07/24/15  Yes Historical Provider, MD    Family History Family History  Problem Relation Age of Onset  . ADD / ADHD Neg Hx   . Alcohol abuse Neg Hx   . Drug abuse Neg Hx   . Anxiety disorder Neg Hx   . Bipolar disorder Neg Hx   . Dementia Neg Hx   . Depression Neg Hx   . OCD Neg Hx   . Paranoid behavior Neg  Hx   . Schizophrenia Neg Hx   . Seizures Neg Hx   . Sexual abuse Neg Hx   . Physical abuse Neg Hx     Social History Social History  Substance Use Topics  . Smoking status: Never Smoker  . Smokeless tobacco: Never Used  . Alcohol use No     Allergies   Sulfa antibiotics; Ibuprofen; and Shellfish allergy   Review of Systems Review of Systems  Unable to perform ROS: Psychiatric disorder  Constitutional: Negative for fever.  Respiratory: Negative for cough.   Gastrointestinal: Negative for vomiting.  Musculoskeletal: Positive for gait problem.  Neurological: Positive for weakness.     Physical Exam Updated Vital Signs BP 141/85 (BP Location: Left Arm)   Pulse 65   Temp 98.3 F (36.8 C) (Oral)   Resp 18   Ht 5\' 7"  (1.702 m)   Wt 180 lb (81.6 kg)   SpO2 100%   BMI 28.19 kg/m   Physical Exam  Constitutional: She appears well-developed and well-nourished. No distress.  HENT:  Head: Normocephalic and atraumatic.  Mouth/Throat: Oropharynx is clear and moist.  Eyes: EOM are normal. Pupils are equal, round, and reactive to light.  Neck: Normal range of motion. Neck supple. No JVD present.  Cardiovascular: Normal rate and regular rhythm.   Pulmonary/Chest: Effort normal and breath sounds normal.  Abdominal: Soft. Bowel sounds are normal. There is no tenderness. There is no rebound and no guarding.  Musculoskeletal: Normal range of motion. She exhibits no edema or tenderness.  Neurological: She is alert.  Oriented to person and place. 5/5 bilateral upper extremities and left lower extremity. Question mild drift to the right lower extremity.  Skin: Skin is warm and dry. No rash noted. No erythema.  Psychiatric:  Flat affect. Minimally verbal.   Nursing note and vitals reviewed.    ED Treatments / Results  Labs (all labs ordered are listed, but only abnormal results are displayed) Labs Reviewed  COMPREHENSIVE METABOLIC PANEL - Abnormal; Notable for the following:        Result Value   Glucose, Bld 266 (*)    All other components within normal limits  URINALYSIS, ROUTINE W REFLEX MICROSCOPIC (NOT AT [facility]) - Abnormal; Notable for the following:    Color, Urine AMBER (*)    Glucose, UA >1000 (*)    Hgb urine dipstick TRACE (*)    Ketones, ur 15 (*)    Protein, ur 30 (*)    All other components within normal limits  DIGOXIN LEVEL - Abnormal; Notable for the following:    Digoxin Level 0.6 (*)    All other components within normal limits  URINE MICROSCOPIC-ADD  ON - Abnormal; Notable for the following:    Squamous Epithelial / LPF 6-30 (*)    Bacteria, UA FEW (*)    All other components within normal limits  CBC WITH DIFFERENTIAL/PLATELET  PROTIME-INR  Randolm Idol, ED    EKG  EKG Interpretation None       Radiology Mr Jeri Cos Wo Contrast  Addendum Date: 09/17/2015   ADDENDUM REPORT: 09/17/2015 19:18 ADDENDUM: I discussed with Dr. Lita Mains that the abnormality involving the medial gyri of the posterior left frontal -parietal lobe (central sulcus region) is new compared to 12/04/2013 MR and not appreciated as hyperdense blood on the 07/26/2015 CT. Therefore this needs to be treated as if this represents subarachnoid hemorrhage (acute to subacute). Close follow-up MR imaging in the next 2 weeks would prove helpful. Electronically Signed   By: Genia Del M.D.   On: 09/17/2015 19:18   Result Date: 09/17/2015 CLINICAL DATA:  65 year old diabetic female with hyperlipidemia presenting with leg weakness for 2 days. Rosai-Dorfman disease. Initial encounter. EXAM: MRI HEAD WITHOUT AND WITH CONTRAST TECHNIQUE: Multiplanar, multiecho pulse sequences of the brain and surrounding structures were obtained without and with intravenous contrast. CONTRAST:  9mL MULTIHANCE GADOBENATE DIMEGLUMINE 529 MG/ML IV SOLN COMPARISON:  07/26/2015 head CT.  12/05/2014 brain MR. FINDINGS: Progressive extra-axial/ dural enhancement consistent with patient's history of  Rosai-Dorfman disease. These areas are consistent with lymphoproliferative changes. Findings include: Diffuse linear dural enhancement. Anterior left middle cranial fossa 1.5 x 2.5 x 2.6 cm extra-axial enhancing mass previously measuring up to 2.2 cm. Lateral left temporal region 1 x 1.2 x 1 cm enhancing lesion previously not visualized. Plaque-like enhancement anterior right middle cranial fossa. Involving the right cavernous sinus. Enhancement along the optic canal greater on the right with extension along the posterior aspect of the right optic nerve. Abnormality involving the medial aspect of the posterior left frontal -parietal lobe is T1 hyperintense with gradient abnormality suggesting this may represent blood or melanin. Atypical extension of lymphoproliferative changes also a consideration. As there is no loss of volume, hemorrhagic infarct is a less likely consideration. No acute thrombotic infarct seen separate from this region. Remote occipital surgery with hemorrhagic breakdown products in the operative site. Global atrophy without hydrocephalus. Major intracranial vascular structures are patent. Partially empty expanded sella. Mild prominence peri optic spaces. Minimal flattening therefore mild increased intracranial pressure not excluded. Appearance unchanged. IMPRESSION: Progressive extra-axial/ dural enhancement consistent with patient's history of Rosai-Dorfman disease. These areas are consistent with lymphoproliferative changes. Findings include: Diffuse linear dural enhancement. Anterior left middle cranial fossa 1.5 x 2.5 x 2.6 cm extra-axial enhancing mass previously measuring up to 2.2 cm. Lateral left temporal region 1 x 1.2 x 1 cm enhancing lesion previously not visualized. Plaque-like enhancement anterior right middle cranial fossa. Involving the right cavernous sinus. Enhancement along the optic canal greater on the right with extension along the posterior aspect of the right optic nerve.  Abnormality involving the medial aspect of the posterior left frontal -parietal lobe is T1 hyperintense with gradient abnormality suggesting this may represent blood or melanin. Atypical extension of lymphoproliferative changes also a consideration. As there is no loss of volume, hemorrhagic infarct is a less likely consideration. No acute thrombotic infarct seen separate from this region. Remote occipital surgery with hemorrhagic breakdown products in the operative site. Global atrophy without hydrocephalus. Partially empty sella and slightly prominent peri optic spaces. Electronically Signed: By: Genia Del M.D. On: 09/17/2015 19:02    Procedures Procedures (including critical  care time)  Medications Ordered in ED Medications  gadobenate dimeglumine (MULTIHANCE) injection 18 mL (18 mLs Intravenous Contrast Given 09/17/15 1822)     Initial Impression / Assessment and Plan / ED Course  I have reviewed the triage vital signs and the nursing notes.  Pertinent labs & imaging results that were available during my care of the patient were reviewed by me and considered in my medical decision making (see chart for details).  Clinical Course  Value Comment By Time  EKG (Reviewed) Julianne Rice, MD 08/08 1517   Discussed MRI findings with Dr. Nicole Kindred. Will evaluate the patient. Julianne Rice, MD 08/08 1931    Seen by Dr. Nicole Kindred. Will admit to ICU  Final Clinical Impressions(s) / ED Diagnoses   Final diagnoses:  Right leg weakness  SAH (subarachnoid hemorrhage) ([place])    New Prescriptions New Prescriptions   No medications on file     Julianne Rice, MD 09/17/15 2112

## 2015-09-18 ENCOUNTER — Encounter (HOSPITAL_COMMUNITY): Payer: Self-pay

## 2015-09-18 ENCOUNTER — Inpatient Hospital Stay (HOSPITAL_COMMUNITY): Payer: BLUE CROSS/BLUE SHIELD

## 2015-09-18 DIAGNOSIS — S066X0A Traumatic subarachnoid hemorrhage without loss of consciousness, initial encounter: Secondary | ICD-10-CM

## 2015-09-18 LAB — GLUCOSE, CAPILLARY
GLUCOSE-CAPILLARY: 157 mg/dL — AB (ref 65–99)
GLUCOSE-CAPILLARY: 252 mg/dL — AB (ref 65–99)
Glucose-Capillary: 125 mg/dL — ABNORMAL HIGH (ref 65–99)
Glucose-Capillary: 230 mg/dL — ABNORMAL HIGH (ref 65–99)
Glucose-Capillary: 220 mg/dL — ABNORMAL HIGH (ref 65–99)

## 2015-09-18 LAB — MRSA PCR SCREENING: MRSA BY PCR: NEGATIVE

## 2015-09-18 MED ORDER — DIGOXIN 125 MCG PO TABS
0.1250 mg | ORAL_TABLET | Freq: Every day | ORAL | Status: DC
  Administered 2015-09-18 – 2015-09-25 (×8): 0.125 mg via ORAL
  Filled 2015-09-18 (×9): qty 1

## 2015-09-18 MED ORDER — ALPRAZOLAM 0.25 MG PO TABS: 0.2500 mg | ORAL_TABLET | Freq: Three times a day (TID) | ORAL | Status: DC

## 2015-09-18 MED ORDER — POTASSIUM CHLORIDE ER 10 MEQ PO TBCR
10.0000 meq | EXTENDED_RELEASE_TABLET | Freq: Every day | ORAL | Status: DC
Start: 2015-09-18 — End: 2015-09-26
  Administered 2015-09-18 – 2015-09-26 (×9): 10 meq via ORAL
  Filled 2015-09-18 (×18): qty 1

## 2015-09-18 MED ORDER — INSULIN NPH (HUMAN) (ISOPHANE) 100 UNIT/ML ~~LOC~~ SUSP
12.0000 [IU] | Freq: Two times a day (BID) | SUBCUTANEOUS | Status: DC
  Administered 2015-09-18 – 2015-09-20 (×3): 13 [IU] via SUBCUTANEOUS
  Administered 2015-09-21: 12 [IU] via SUBCUTANEOUS
  Administered 2015-09-21: 13 [IU] via SUBCUTANEOUS
  Administered 2015-09-22 (×2): 12 [IU] via SUBCUTANEOUS
  Administered 2015-09-23: 14 [IU] via SUBCUTANEOUS
  Administered 2015-09-24: 12 [IU] via SUBCUTANEOUS
  Filled 2015-09-18 (×2): qty 10

## 2015-09-18 MED ORDER — INSULIN ASPART 100 UNIT/ML ~~LOC~~ SOLN
0.0000 [IU] | Freq: Three times a day (TID) | SUBCUTANEOUS | Status: DC
  Administered 2015-09-18 – 2015-09-19 (×2): 5 [IU] via SUBCUTANEOUS
  Administered 2015-09-19 – 2015-09-20 (×2): 2 [IU] via SUBCUTANEOUS
  Administered 2015-09-20: 5 [IU] via SUBCUTANEOUS
  Administered 2015-09-20: 2 [IU] via SUBCUTANEOUS
  Administered 2015-09-21 (×2): 3 [IU] via SUBCUTANEOUS
  Administered 2015-09-22: 2 [IU] via SUBCUTANEOUS
  Administered 2015-09-22 (×2): 5 [IU] via SUBCUTANEOUS
  Administered 2015-09-23: 3 [IU] via SUBCUTANEOUS
  Administered 2015-09-23: 2 [IU] via SUBCUTANEOUS
  Administered 2015-09-23: 3 [IU] via SUBCUTANEOUS
  Administered 2015-09-24 (×2): 5 [IU] via SUBCUTANEOUS
  Administered 2015-09-24 – 2015-09-25 (×2): 2 [IU] via SUBCUTANEOUS
  Administered 2015-09-25: 3 [IU] via SUBCUTANEOUS
  Administered 2015-09-25 – 2015-09-26 (×2): 1 [IU] via SUBCUTANEOUS

## 2015-09-18 MED ORDER — FAMOTIDINE 20 MG PO TABS
20.0000 mg | ORAL_TABLET | Freq: Every day | ORAL | Status: DC
  Administered 2015-09-18 – 2015-09-26 (×9): 20 mg via ORAL
  Filled 2015-09-18 (×9): qty 1

## 2015-09-18 MED ORDER — TRAZODONE HCL 50 MG PO TABS: 75.0000 mg | ORAL_TABLET | Freq: Every day | ORAL | Status: DC

## 2015-09-18 MED ORDER — RISPERIDONE 2 MG PO TABS: 2.0000 mg | ORAL_TABLET | Freq: Every day | ORAL | Status: DC

## 2015-09-18 MED ORDER — INSULIN ASPART 100 UNIT/ML ~~LOC~~ SOLN
0.0000 [IU] | Freq: Every day | SUBCUTANEOUS | Status: DC
  Administered 2015-09-18: 2 [IU] via SUBCUTANEOUS
  Administered 2015-09-19: 3 [IU] via SUBCUTANEOUS
  Administered 2015-09-20: 4 [IU] via SUBCUTANEOUS
  Administered 2015-09-21: 3 [IU] via SUBCUTANEOUS
  Administered 2015-09-22 – 2015-09-24 (×2): 2 [IU] via SUBCUTANEOUS

## 2015-09-18 MED ORDER — PAROXETINE HCL 20 MG PO TABS: 40.0000 mg | ORAL_TABLET | Freq: Every day | ORAL | Status: DC

## 2015-09-18 MED ORDER — DILTIAZEM HCL ER COATED BEADS 180 MG PO CP24
180.0000 mg | ORAL_CAPSULE | Freq: Every day | ORAL | Status: DC
  Administered 2015-09-18 – 2015-09-26 (×8): 180 mg via ORAL
  Filled 2015-09-18 (×9): qty 1

## 2015-09-18 MED ORDER — ROSUVASTATIN CALCIUM 5 MG PO TABS
10.0000 mg | ORAL_TABLET | Freq: Every day | ORAL | Status: DC
  Administered 2015-09-18 – 2015-09-25 (×7): 10 mg via ORAL
  Filled 2015-09-18 (×2): qty 2
  Filled 2015-09-18: qty 1
  Filled 2015-09-18 (×6): qty 2

## 2015-09-18 NOTE — Progress Notes (Signed)
PT Cancellation Note  Patient Details Name: Rachel [name] MRN: [id number] DOB: 10-03-1950   Cancelled Treatment:    Reason Eval/Treat Not Completed: Patient at procedure or test/unavailable.  PT to check back later today or tomorrow as time allows.    Thanks,    09/18/2015, 5:32 PM

## 2015-09-18 NOTE — Evaluation (Signed)
Speech Language Pathology Evaluation Patient Details Name: Rachel [name] MRN: [id number] DOB: December 23, 1950 Today's Date: 09/18/2015 Time: [id number] SLP Time Calculation (min) (ACUTE ONLY): 26 min  Problem List:  Patient Active Problem List   Diagnosis Date Noted  . SAH (subarachnoid hemorrhage) ([place]) 09/17/2015  . Facial droop 12/04/2013  . 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 Blind in both eyes 05/31/2013  . HTN (hypertension) 10/11/2012  . Paroxysmal atrial fibrillation ([place]) 10/11/2012  . Dyslipidemia 10/11/2012  . H/O brain tumor 10/11/2012  . Insomnia due to mental disorder 12/29/2011  . Libido, decreased 12/29/2011  . Depression 04/14/2011   Past Medical History:  Past Medical History:  Diagnosis Date  . Blindness   . Diabetes mellitus type I ([place])   . GERD (gastroesophageal reflux disease)   . Hyperlipidemia   . Paroxysmal atrial fibrillation ([facility])   . Schizophrenia ([place])   . Systemic hypertension   . Vertigo    Past Surgical History:  Past Surgical History:  Procedure Laterality Date  . BRAIN TUMOR EXCISION    . NM MYOCAR PERF WALL MOTION  02/01/2007   no significant ischemia  . US ECHOCARDIOGRAPHY  12/20/2003   mild mitral annular ca+,mild MR,TR,PI,AOV mildly sclerotic   HPI:   Rachel Nunnery Richardsonis an 65 y.o.femalewith a history of )   Assessment / Plan / Recommendation Clinical Impression  Pt presents with cognitive-linguistic limitations most significantly noted with limited attention, recall and awareness. General speech/language function appears WFL. Of note, pt demonstrated lingual tremor upon protrusion and speech intelligibility was reduced primarily secondary to low vocal intensity. Pt required direct cueing to adjust volume to increase intelligibility.  Unclear what pt's baseline function is. Pt required moderate verbal and visual cueing to initiate responses. Pt benefited from semantic cueing to facilitate recall of new information. Pt is Ox self and  place only. Poor awareness of reason for hospitalization and current year did not seem familiar to pt. Will follow for therapy services and make recommendations as pt progresses.     SLP Assessment  Patient needs continued Speech Lanaguage Pathology Services    Follow Up Recommendations  24 hour supervision/assistance (TBD)    Frequency and Duration min 2x/week  2 weeks      SLP Evaluation Prior Functioning  Cognitive/Linguistic Baseline: Information not available   Cognition  Overall Cognitive Status: No family/caregiver present to determine baseline cognitive functioning Arousal/Alertness: Lethargic Orientation Level: Disoriented to person;Disoriented to place Attention: Sustained Sustained Attention: Impaired Sustained Attention Impairment: Verbal basic;Functional basic Memory: Impaired Memory Impairment: Decreased recall of new information;Retrieval deficit Awareness: Impaired Awareness Impairment: Intellectual impairment Problem Solving: Appears intact Executive Function:  (all impaired due to underlying limitation) Safety/Judgment: Impaired Comments: pt demonstrated poor initiation with all tasks, delayed processing    Comprehension  Auditory Comprehension Overall Auditory Comprehension: Appears within functional limits for tasks assessed Yes/No Questions: Within Functional Limits Commands: Within Functional Limits Conversation: Simple Interfering Components: Attention EffectiveTechniques: Extra processing time Visual Recognition/Discrimination Discrimination:  (appears to have some inattention to the L, difficult to disc) Reading Comprehension Reading Status:  (pt reports inability to read at baseline)    Expression Expression Primary Mode of Expression: Verbal Verbal Expression Overall Verbal Expression: Appears within functional limits for tasks assessed Initiation: Impaired Naming: No impairment Pragmatics:  (flat affect) Interfering Components:  Attention;Speech intelligibility (secondary to decreased effort/low vocal intensity) Written Expression Dominant Hand: Right Written Expression:  (pt denies writing at baseline)   Oral / Motor  Oral Motor/Sensory Function Overall Oral Motor/Sensory Function:  Mild impairment Facial ROM: Reduced left Facial Symmetry: Abnormal symmetry left (upon labial retraction) Lingual ROM:  (lingual tremor noted upon protrusion) Lingual Symmetry: Within Functional Limits Lingual Strength: Reduced Motor Speech Overall Motor Speech: Impaired Respiration: Within functional limits Phonation: Low vocal intensity Resonance: Within functional limits Articulation: Within functional limitis Intelligibility: Intelligibility reduced Word: 75-100% accurate Phrase: 75-100% accurate Conversation: 50-74% accurate Motor Planning: Witnin functional limits Effective Techniques: Increased vocal intensity   GO                    [name] MA, CCC-SLP Pager [phone] 09/18/2015, 10:26 AM

## 2015-09-18 NOTE — Progress Notes (Signed)
At this time pt cannot even turn herself on her own. Unable to even lift right foot off of bed. Delaying ambulating pt at this time due to this.

## 2015-09-18 NOTE — Progress Notes (Signed)
OT Cancellation Note  Patient Details Name: [name] MRN: [id number] DOB: 12/03/1950   Cancelled Treatment:    Reason Eval/Treat Not Completed: Patient not medically ready (strict bedrest orders)   Peri Maris  [id number] 09/18/2015, 7:04 AM

## 2015-09-18 NOTE — Progress Notes (Signed)
Pt arrived to unit, oriented to room. Call light within reach. Floor mat placed. Bed alarm on.

## 2015-09-18 NOTE — Progress Notes (Signed)
STROKE TEAM PROGRESS NOTE   HISTORY OF PRESENT ILLNESS (per record) Rachel [name] is an 65 y.o. female with a history of Rosai-Dorfman disease, resection of an occipital mass lesion, hypertension, paroxysmal atrial fibrillation, hyperlipidemia, schizophrenia and diabetes mellitus, presenting with new onset weakness involving right leg for 2 days. She has no previous history of stroke no TIA. She's been taking aspirin 81 mg per day. MRI of the brain showed findings indicative of acute to subacute left frontoparietal subarachnoid hemorrhage, and addition to enhancing mass lesions in the left middle cranial fossa and left lateral temporal region. Plaque-like enhancement was noted involving anterior middle cranial fossa on the right, as well as an enhancing lesion involving right optic nerve. Postsurgical changes in the occipital region noted. Patient has had no change in speech and no symptoms involving the right upper extremity. She has chronic right facial weakness which is unchanged. She was LKW 09/14/2015, time unknown. Patient was not administered IV t-PA secondary to [facility]. She was admitted to the neuro ICU for further evaluation and treatment.   SUBJECTIVE (INTERVAL HISTORY) Her husband is at the bedside.  He says she is blind in the R eye and loosing vision in her L eye. I took extensive history from the patient and husband as well as reviewed her imaging studies personally as well as with neuroradiologist and neurosurgeon. Patient denies significant headache but does say that she is are several falls in the last few days including hitting her head on a set of drawers.   OBJECTIVE Temp:  [97.8 F (36.6 C)-98.4 F (36.9 C)] 97.8 F (36.6 C) (08/09 1200) Pulse Rate:  [50-80] 66 (08/09 1300) Resp:  [15-22] 20 (08/09 1300) BP: (88-147)/(47-91) 128/60 (08/09 1300) SpO2:  [95 %-100 %] 98 % (08/09 1300) Weight:  [81.5 kg (179 lb 11.2 oz)] 81.5 kg (179 lb 11.2 oz) (08/08 2200)  CBC:   Recent  Labs Lab 09/17/15 1231  WBC 10.2  NEUTROABS 7.3  HGB 13.9  HCT 42.0  MCV 91.3  PLT [id number]    Basic Metabolic Panel:   Recent Labs Lab 09/17/15 1231  NA 137  K 3.9  CL 105  CO2 25  GLUCOSE 266*  BUN 6  CREATININE 0.89  CALCIUM 10.2    Lipid Panel:     Component Value Date/Time   CHOL 127 09/11/2013 1101   TRIG 45 09/11/2013 1101   HDL 59 09/11/2013 1101   CHOLHDL 2.2 09/11/2013 1101   VLDL 9 09/11/2013 1101   LDLCALC 59 09/11/2013 1101   HgbA1c:  Lab Results  Component Value Date   HGBA1C 10.3 (H) 04/01/2012   Urine Drug Screen:     Component Value Date/Time   LABOPIA NONE DETECTED 08/15/2015 1327   COCAINSCRNUR NONE DETECTED 08/15/2015 1327   LABBENZ POSITIVE (A) 08/15/2015 1327   AMPHETMU NONE DETECTED 08/15/2015 1327   THCU NONE DETECTED 08/15/2015 1327   LABBARB NONE DETECTED 08/15/2015 1327      IMAGING  Ct Head Wo Contrast  Result Date: 09/18/2015 CLINICAL DATA:  65 year old female with Rosai-Dorfman Disease. Abnormal brain MRI yesterday, with possible trace subarachnoid hemorrhage in the posterior left hemisphere,interhemispheric fissure. Initial encounter. EXAM: CT HEAD WITHOUT CONTRAST TECHNIQUE: Contiguous axial images were obtained from the base of the skull through the vertex without intravenous contrast. COMPARISON:  Brain MRI 09/17/2015, noncontrast head CT 07/26/2015, and earlier. FINDINGS: Stable visualized osseous structures. Visualized paranasal sinuses and mastoids are stable and well pneumatized. Negative orbit and scalp soft tissues. Corresponding  to the posterior left interhemispheric fissure abnormality on MRI of which was suggestive of hemorrhage there is new focal intermediate and height density (series 2, image 23), where previously normal CSF was present on 07/26/2015. The configuration of this is stable since yesterday. There is no significant regional mass effect. No intraventricular or new extra-axial hemorrhage or abnormality  identified. Stable gray-white matter differentiation throughout the brain. Stable sequelae of suboccipital craniectomy. No cortically based acute infarct identified. IMPRESSION: 1. MRI abnormality yesterday in the posterior left interhemispheric fissure does correspond to abnormal intermediate to high density material on CT, new since 07/26/2015. This is most suspicious for a small volume of subarachnoid hemorrhage, with progression of the underlying chronic lymphoproliferative abnormality felt less likely. 2. Otherwise stable noncontrast CT appearance the brain since 07/26/2015. See also Brain MRI report from yesterday. Study discussed by telephone with Dr. Leonie Man On 09/18/2015 at 11:27 . Electronically Signed   By: Genevie Ann M.D.   On: 09/18/2015 11:35   Mr Jeri Cos [id number] Contrast  Addendum Date: 09/17/2015   ADDENDUM REPORT: 09/17/2015 19:18 ADDENDUM: I discussed with Dr. Lita Mains that the abnormality involving the medial gyri of the posterior left frontal -parietal lobe (central sulcus region) is new compared to 12/04/2013 MR and not appreciated as hyperdense blood on the 07/26/2015 CT. Therefore this needs to be treated as if this represents subarachnoid hemorrhage (acute to subacute). Close follow-up MR imaging in the next 2 weeks would prove helpful. Electronically Signed   By: Genia Del M.D.   On: 09/17/2015 19:18   Result Date: 09/17/2015 CLINICAL DATA:  65 year old diabetic female with hyperlipidemia presenting with leg weakness for 2 days. Rosai-Dorfman disease. Initial encounter. EXAM: MRI HEAD WITHOUT AND WITH CONTRAST TECHNIQUE: Multiplanar, multiecho pulse sequences of the brain and surrounding structures were obtained without and with intravenous contrast. CONTRAST:  61mL MULTIHANCE GADOBENATE DIMEGLUMINE 529 MG/ML IV SOLN COMPARISON:  07/26/2015 head CT.  12/05/2014 brain MR. FINDINGS: Progressive extra-axial/ dural enhancement consistent with patient's history of Rosai-Dorfman disease. These areas  are consistent with lymphoproliferative changes. Findings include: Diffuse linear dural enhancement. Anterior left middle cranial fossa 1.5 x 2.5 x 2.6 cm extra-axial enhancing mass previously measuring up to 2.2 cm. Lateral left temporal region 1 x 1.2 x 1 cm enhancing lesion previously not visualized. Plaque-like enhancement anterior right middle cranial fossa. Involving the right cavernous sinus. Enhancement along the optic canal greater on the right with extension along the posterior aspect of the right optic nerve. Abnormality involving the medial aspect of the posterior left frontal -parietal lobe is T1 hyperintense with gradient abnormality suggesting this may represent blood or melanin. Atypical extension of lymphoproliferative changes also a consideration. As there is no loss of volume, hemorrhagic infarct is a less likely consideration. No acute thrombotic infarct seen separate from this region. Remote occipital surgery with hemorrhagic breakdown products in the operative site. Global atrophy without hydrocephalus. Major intracranial vascular structures are patent. Partially empty expanded sella. Mild prominence peri optic spaces. Minimal flattening therefore mild increased intracranial pressure not excluded. Appearance unchanged. IMPRESSION: Progressive extra-axial/ dural enhancement consistent with patient's history of Rosai-Dorfman disease. These areas are consistent with lymphoproliferative changes. Findings include: Diffuse linear dural enhancement. Anterior left middle cranial fossa 1.5 x 2.5 x 2.6 cm extra-axial enhancing mass previously measuring up to 2.2 cm. Lateral left temporal region 1 x 1.2 x 1 cm enhancing lesion previously not visualized. Plaque-like enhancement anterior right middle cranial fossa. Involving the right cavernous sinus. Enhancement along the optic canal  greater on the right with extension along the posterior aspect of the right optic nerve. Abnormality involving the medial  aspect of the posterior left frontal -parietal lobe is T1 hyperintense with gradient abnormality suggesting this may represent blood or melanin. Atypical extension of lymphoproliferative changes also a consideration. As there is no loss of volume, hemorrhagic infarct is a less likely consideration. No acute thrombotic infarct seen separate from this region. Remote occipital surgery with hemorrhagic breakdown products in the operative site. Global atrophy without hydrocephalus. Partially empty sella and slightly prominent peri optic spaces. Electronically Signed: By: Genia Del M.D. On: 09/17/2015 19:02   Chest Port 1 View  Result Date: 09/17/2015 CLINICAL DATA:  Stroke. EXAM: PORTABLE CHEST 1 VIEW COMPARISON:  08/15/2015 FINDINGS: Heart is mildly enlarged. There are no focal consolidations or pleural effusions. No pulmonary edema. IMPRESSION: Cardiomegaly.  No evidence for acute pulmonary abnormality. Electronically Signed   By: Nolon Nations M.D.   On: 09/17/2015 23:59    PHYSICAL EXAM Pleasant middle aged african american lady not in distress. . Afebrile. Head is nontraumatic. Neck is supple without bruit.    Cardiac exam no murmur or gallop. Lungs are clear to auscultation. Distal pulses are well felt. Neurological Exam :  Awake alert oriented 3. Normal speech and language except slight nonfluent speech. No aphasia or dysarthria. Extraocular movements are full range without nystagmus. Blinks to threat bilaterally. 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 Slight restriction of peripheral vision in the left eye. Fundi were not visualized. Right lower facial weakness which is chronic. Tongue midline. Motor system exam no upper extremity drift. Slight diminished fine finger movements on the right compared to the left. Orbits left over right upper extremity. Right lower extremity weakness graded 2/5 strength. No left-sided weakness. Sensation is intact bilaterally. Plantars are both  downgoing. Gait was not tested. ASSESSMENT/PLAN Rachel [name] is a 65 y.o. female with history of Rosai-Dorfman disease, resection of an occipital mass lesion, hypertension, paroxysmal atrial fibrillation, hyperlipidemia, schizophrenia and diabetes mellitus presenting with R leg weakness x 2 days. MRI showed L frontoparietal medial SAH.    recurrent R leg weakness with enhancing L frontal parasaggital lesion felt to be Small localized subacute [facility]   Neurosurgery consult Dr. Leonie Man discussed at length with Dr. Christella Noa   MRI  Lesions left frontoparietal medial likely traumatic SAH  Doubt aneurysm or vascular abnormality. Dr. Christella Noa to discuss with neuroradiologist -> all agree subacute SAH. Rosai-Drofman lesions are stable.  Check plain CT  Carotid Doppler  cancel  SCDs for VTE prophylaxis Diet Carb Modified Fluid consistency: Thin; Room service appropriate? Yes  aspirin 81 mg daily prior to admission, now on No antithrombotic. Hold aspirin given SAH  Resume NIHSS  Therapy recommendations:  pending   Disposition:  pending   Paroxysmal Atrial Fibrillation  Home anticoagulation:  none now none due to h/o SAH and poor long term   Hyperlipidemia  Home meds:  crestor 10  Resume statin in hospital   Continue statin at discharge  Diabetes type I  HgbA1c ordered, goal < 7.0  adding Novolog sensitive correction of 0-9 units tid with meals   CBG ac & hs.  Other Stroke Risk Factors  Overweight, Body mass index is 28.14 kg/m., recommend weight loss, diet and exercise as appropriate   Other Active Problems  Blind   Schizophrenia  Hx occipital brain tumor [facility] day # [place] for Pager  information 09/18/2015 1:24 PM  I have personally examined this patient, reviewed notes, independently viewed imaging studies, participated in medical decision making and plan of care. I have made any additions or  clarifications directly to the above note. Agree with note above. She has presented with 3-4 day history of recurrent falls including ) Injury. CT scan of the brain as well as MRI show a subarachnoid localized subarachnoid hemorrhage over the left frontal parasagittal region which explains her right leg weakness. She does have long-standing history of Rosai-Dorfman disease which I feel is incidental and not contributing to her present presentation. I had a long discussion with the patient, husband as well as Dr. Cyndy Freeze and neuroradiologist about her condition and answered questions. Recommend mobilize out of bed today. Physical occupational and rehabilitation consults. Hold aspirin for 1 week and resume later. This patient is critically ill and at significant risk of neurological worsening, death and care requires constant monitoring of vital signs, hemodynamics,respiratory and cardiac monitoring, extensive review of multiple databases, frequent neurological assessment, discussion with family, other specialists and medical decision making of high complexity.I have made any additions or clarifications directly to the above note.This critical care time does not reflect procedure time, or teaching time or supervisory time of PA/NP/Med Resident etc but could involve care discussion time.  I spent 30 minutes of neurocritical care time  in the care of  this patient.      Antony Contras, MD Medical Director [facility] Pager: [phone] 09/18/2015 1:28 PM  To contact Stroke Continuity provider, please refer to [email or web address] After hours, contact General Neurology

## 2015-09-18 NOTE — Progress Notes (Signed)
Inpatient Diabetes Program Recommendations  AACE/ADA: New Consensus Statement on Inpatient Glycemic Control (2015)  Target Ranges:  Prepandial:   less than 140 mg/dL      Peak postprandial:   less than 180 mg/dL (1-2 hours)      Critically ill patients:  140 - 180 mg/dL  Results for Rachel [name], Rachel [name] (MRN [id number]) as of 09/18/2015 09:12  Ref. Range 09/17/2015 22:26 09/18/2015 08:19  Glucose-Capillary Latest Ref Range: 65 - 99 mg/dL 125 (H) 157 (H)   Review of Glycemic Control  Diabetes history: DM 2 Outpatient Diabetes medications: NPH 12-14 units bid Current orders for Inpatient glycemic control: None   Inpatient Diabetes Program Recommendations:  Please consider A1c to determine prehospital glycemic control and adding Novolog sensitive correction of 0-9 units tid with meals and check CBG hs.  Thank you, Nani Gasser. Thornton Dohrmann, RN, MSN, CDE Inpatient Glycemic Control Team Team Pager [phone] (8am-5pm) 09/18/2015 9:18 AM

## 2015-09-19 ENCOUNTER — Inpatient Hospital Stay (HOSPITAL_COMMUNITY): Payer: BLUE CROSS/BLUE SHIELD

## 2015-09-19 ENCOUNTER — Telehealth (HOSPITAL_COMMUNITY): Payer: Self-pay | Admitting: *Deleted

## 2015-09-19 ENCOUNTER — Inpatient Hospital Stay (HOSPITAL_COMMUNITY)
Admit: 2015-09-19 | Discharge: 2015-09-19 | Disposition: A | Discharge status: Home / Self Care | Payer: BLUE CROSS/BLUE SHIELD | Attending: Nurse Practitioner | Admitting: Nurse Practitioner

## 2015-09-19 DIAGNOSIS — S066X0S Traumatic subarachnoid hemorrhage without loss of consciousness, sequela: Secondary | ICD-10-CM

## 2015-09-19 LAB — GLUCOSE, CAPILLARY
Glucose-Capillary: 157 mg/dL — ABNORMAL HIGH (ref 65–99)
Glucose-Capillary: 131 mg/dL — ABNORMAL HIGH (ref 65–99)
Glucose-Capillary: 121 mg/dL — ABNORMAL HIGH (ref 65–99)
Glucose-Capillary: 273 mg/dL — ABNORMAL HIGH (ref 65–99)
Glucose-Capillary: 277 mg/dL — ABNORMAL HIGH (ref 65–99)

## 2015-09-19 MED ORDER — TRAZODONE HCL 50 MG PO TABS: 75.0000 mg | ORAL_TABLET | Freq: Every evening | ORAL | Status: DC | PRN

## 2015-09-19 NOTE — Progress Notes (Signed)
Pt to CT

## 2015-09-19 NOTE — Progress Notes (Addendum)
STROKE TEAM PROGRESS NOTE   SUBJECTIVE (INTERVAL HISTORY) Her husband is at the bedside.  Nurse had difficulty waking her up this am. No other neuro abnormalities noted. Did get regularly scheduled home meds last night. CT head this am stable. Will check EEG. Make trazadone prn. Husband did report she was agitated last night when he was here.  Dr. Leonie Man spoke with patient and husband related to issues this am. More awake during rounds, after CT.   OBJECTIVE Temp:  [97.5 F (36.4 C)-98.8 F (37.1 C)] 97.9 F (36.6 C) (08/10 1012) Pulse Rate:  [53-85] 53 (08/10 1012) Cardiac Rhythm: Sinus bradycardia (08/10 0700) Resp:  [16-29] 16 (08/10 1012) BP: (127-149)/(60-106) 144/72 (08/10 1012) SpO2:  [97 %-100 %] 97 % (08/10 1012)  CBC:   Recent Labs Lab 09/17/15 1231  WBC 10.2  NEUTROABS 7.3  HGB 13.9  HCT 42.0  MCV 91.3  PLT 607    Basic Metabolic Panel:   Recent Labs Lab 09/17/15 1231  NA 137  K 3.9  CL 105  CO2 25  GLUCOSE 266*  BUN 6  CREATININE 0.89  CALCIUM 10.2    Lipid Panel:     Component Value Date/Time   CHOL 127 09/11/2013 1101   TRIG 45 09/11/2013 1101   HDL 59 09/11/2013 1101   CHOLHDL 2.2 09/11/2013 1101   VLDL 9 09/11/2013 1101   LDLCALC 59 09/11/2013 1101   HgbA1c:  Lab Results  Component Value Date   HGBA1C 10.3 (H) 04/01/2012   Urine Drug Screen:     Component Value Date/Time   LABOPIA NONE DETECTED 08/15/2015 1327   COCAINSCRNUR NONE DETECTED 08/15/2015 1327   LABBENZ POSITIVE (A) 08/15/2015 1327   AMPHETMU NONE DETECTED 08/15/2015 1327   THCU NONE DETECTED 08/15/2015 1327   LABBARB NONE DETECTED 08/15/2015 1327      IMAGING  Ct Head Wo Contrast  Result Date: 09/19/2015 CLINICAL DATA:  Follow-up subarachnoid hemorrhage. EXAM: CT HEAD WITHOUT CONTRAST TECHNIQUE: Contiguous axial images were obtained from the base of the skull through the vertex without intravenous contrast. COMPARISON:  CT head 09/18/2015 FINDINGS: Subarachnoid  hemorrhage left medial convexity shows mild improvement since yesterday. No subdural hemorrhage. No new area of hemorrhage since the prior study. Moderate atrophy most prominent in the frontal and temporal lobes. Negative for hydrocephalus. Enhancing dural-based mass lesions in the middle cranial fossa better seen on MRI. Prior occipital craniectomy. Heterogeneous skull with multiple tiny areas of low-density throughout the skull widely distributed and symmetric. This has progressed since 2015 was not present in 2006. This could be due to metabolic bone disease or myeloma. IMPRESSION: Improving subarachnoid hemorrhage left convexity. No new area of hemorrhage Diffusely abnormal calvarium of uncertain etiology. Recommend evaluation for multiple myeloma. Electronically Signed   By: Franchot Gallo M.D.   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 Abnormal brain MRI yesterday, with possible trace subarachnoid hemorrhage in the posterior left hemisphere,interhemispheric fissure. Initial encounter. EXAM: CT HEAD WITHOUT CONTRAST TECHNIQUE: Contiguous axial images were obtained from the base of the skull through the vertex without intravenous contrast. COMPARISON:  Brain MRI 09/17/2015, noncontrast head CT 07/26/2015, and earlier. FINDINGS: Stable visualized osseous structures. Visualized paranasal sinuses and mastoids are stable and well pneumatized. Negative orbit and scalp soft tissues. Corresponding to the posterior left interhemispheric fissure abnormality on MRI of which was suggestive of hemorrhage there is new focal intermediate and height density (series  2, image 23), where previously normal CSF was present on 07/26/2015. The configuration of this is stable since yesterday. There is no significant regional mass effect. No intraventricular or new extra-axial hemorrhage or abnormality identified. Stable gray-white matter  differentiation throughout the brain. Stable sequelae of suboccipital craniectomy. No cortically based acute infarct identified. IMPRESSION: 1. MRI abnormality yesterday in the posterior left interhemispheric fissure does correspond to abnormal intermediate to high density material on CT, new since 07/26/2015. This is most suspicious for a small volume of subarachnoid hemorrhage, with progression of the underlying chronic lymphoproliferative abnormality felt less likely. 2. Otherwise stable noncontrast CT appearance the brain since 07/26/2015. See also Brain MRI report from yesterday. Study discussed by telephone with Dr. Leonie Man On 09/18/2015 at 11:27 . Electronically Signed   By: Genevie Ann M.D.   On: 09/18/2015 11:35   Mr Jeri Cos ID Contrast  Addendum Date: 09/17/2015   ADDENDUM REPORT: 09/17/2015 19:18 ADDENDUM: I discussed with Dr. Lita Mains that the abnormality involving the medial gyri of the posterior left frontal -parietal lobe (central sulcus region) is new compared to 12/04/2013 MR and not appreciated as hyperdense blood on the 07/26/2015 CT. Therefore this needs to be treated as if this represents subarachnoid hemorrhage (acute to subacute). Close follow-up MR imaging in the next 2 weeks would prove helpful. Electronically Signed   By: Genia Del M.D.   On: 09/17/2015 19:18   Result Date: 09/17/2015 CLINICAL DATA:  65 year old diabetic female with hyperlipidemia presenting with leg weakness for 2 days. Rosai-Dorfman disease. Initial encounter. EXAM: MRI HEAD WITHOUT AND WITH CONTRAST TECHNIQUE: Multiplanar, multiecho pulse sequences of the brain and surrounding structures were obtained without and with intravenous contrast. CONTRAST:  49m MULTIHANCE GADOBENATE DIMEGLUMINE 529 MG/ML IV SOLN COMPARISON:  07/26/2015 head CT.  12/05/2014 brain MR. FINDINGS: Progressive extra-axial/ dural enhancement consistent with patient's history of Rosai-Dorfman disease. These areas are consistent with  lymphoproliferative changes. Findings include: Diffuse linear dural enhancement. Anterior left middle cranial fossa 1.5 x 2.5 x 2.6 cm extra-axial enhancing mass previously measuring up to 2.2 cm. Lateral left temporal region 1 x 1.2 x 1 cm enhancing lesion previously not visualized. Plaque-like enhancement anterior right middle cranial fossa. Involving the right cavernous sinus. Enhancement along the optic canal greater on the right with extension along the posterior aspect of the right optic nerve. Abnormality involving the medial aspect of the posterior left frontal -parietal lobe is T1 hyperintense with gradient abnormality suggesting this may represent blood or melanin. Atypical extension of lymphoproliferative changes also a consideration. As there is no loss of volume, hemorrhagic infarct is a less likely consideration. No acute thrombotic infarct seen separate from this region. Remote occipital surgery with hemorrhagic breakdown products in the operative site. Global atrophy without hydrocephalus. Major intracranial vascular structures are patent. Partially empty expanded sella. Mild prominence peri optic spaces. Minimal flattening therefore mild increased intracranial pressure not excluded. Appearance unchanged. IMPRESSION: Progressive extra-axial/ dural enhancement consistent with patient's history of Rosai-Dorfman disease. These areas are consistent with lymphoproliferative changes. Findings include: Diffuse linear dural enhancement. Anterior left middle cranial fossa 1.5 x 2.5 x 2.6 cm extra-axial enhancing mass previously measuring up to 2.2 cm. Lateral left temporal region 1 x 1.2 x 1 cm enhancing lesion previously not visualized. Plaque-like enhancement anterior right middle cranial fossa. Involving the right cavernous sinus. Enhancement along the optic canal greater on the right with extension along the posterior aspect of the right optic nerve. Abnormality involving the medial aspect of the  posterior  left frontal -parietal lobe is T1 hyperintense with gradient abnormality suggesting this may represent blood or melanin. Atypical extension of lymphoproliferative changes also a consideration. As there is no loss of volume, hemorrhagic infarct is a less likely consideration. No acute thrombotic infarct seen separate from this region. Remote occipital surgery with hemorrhagic breakdown products in the operative site. Global atrophy without hydrocephalus. Partially empty sella and slightly prominent peri optic spaces. Electronically Signed: By: Genia Del M.D. On: 09/17/2015 19:02   Chest Port 1 View  Result Date: 09/17/2015 CLINICAL DATA:  Stroke. EXAM: PORTABLE CHEST 1 VIEW COMPARISON:  08/15/2015 FINDINGS: Heart is mildly enlarged. There are no focal consolidations or pleural effusions. No pulmonary edema. IMPRESSION: Cardiomegaly.  No evidence for acute pulmonary abnormality. Electronically Signed   By: Nolon Nations M.D.   On: 09/17/2015 23:59    PHYSICAL EXAM Pleasant middle aged african american lady not in distress. . Afebrile. Head is nontraumatic. Neck is supple without bruit.    Cardiac exam no murmur or gallop. Lungs are clear to auscultation. Distal pulses are well felt. Neurological Exam :  Drowsy but awakens easily. oriented 3. Normal speech and language except slight nonfluent speech. No aphasia or dysarthria. Extraocular movements are full range without nystagmus. Blinks to threat bilaterally. 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 Slight restriction of peripheral vision in the left eye. Fundi were not visualized. Right lower facial weakness which is chronic. Tongue midline. Motor system exam no upper extremity drift. Slight diminished fine finger movements on the right compared to the left. Orbits left over right upper extremity. Right lower extremity weakness graded 2/5 strength. No left-sided weakness. Sensation is intact bilaterally. Plantars are both  downgoing. Gait was not tested.   ASSESSMENT/PLAN Rachel [name] is a 65 y.o. female with history of Rosai-Dorfman disease, resection of an occipital mass lesion, hypertension, paroxysmal atrial fibrillation, hyperlipidemia, schizophrenia and diabetes mellitus presenting with R leg weakness x 2 days. MRI showed L frontoparietal medial SAH.    recurrent R leg weakness with enhancing L frontal parasaggital lesion felt to be Small localized subacute [facility]   Neurosurgery consult Dr. Leonie Man discussed at length with Dr. Christella Noa   MRI  Lesions left frontoparietal medial likely traumatic SAH  Doubt aneurysm or vascular abnormality. Dr. Christella Noa to discuss with neuroradiologist -> all agree subacute SAH. Rosai-Drofman lesions are stable.  CT head shows SAH  Repeat CT head this am when sleep unchanged  Carotid Doppler  Cancel  Check EEG  SCDs for VTE prophylaxis Diet Carb Modified Fluid consistency: Thin; Room service appropriate? Yes  aspirin 81 mg daily prior to admission, now on No antithrombotic. Hold aspirin given SAH  Resume NIHSS  Therapy recommendations:  pending   Disposition:  pending   Paroxysmal Atrial Fibrillation  Home anticoagulation:  none now none due to h/o SAH and poor long term   Hyperlipidemia  Home meds:  crestor 10  Resumed statin in hospital   Continue statin at discharge  Diabetes type I  HgbA1c ordered, goal < 7.0  adding Novolog sensitive correction of 0-9 units tid with meals   CBG ac & hs.  Other Stroke Risk Factors  Overweight, Body mass index is 28.14 kg/m., recommend weight loss, diet and exercise as appropriate   Other Active Problems  Blind   Schizophrenia  Hx occipital brain tumor [facility] day # 2  BIBY,SHARON  [place] for Pager information 09/19/2015 11:13 AM  I have  personally examined this patient, reviewed notes, independently viewed imaging studies, participated in medical  decision making and plan of care. I have made any additions or clarifications directly to the above note. Agree with note above.  Await rehab consult and transfer. Check EEG for seizures. D/W husband and answered questions. Greater than 50% of time during this 35 minute visit was spent on counseling and coordination of care about stroke, seizures and answering questions Antony Contras, [place] Pager: [phone] 09/19/2015 4:13 PM   To contact Stroke Continuity provider, please refer to [email or web address] After hours, contact General NeurologY

## 2015-09-19 NOTE — Evaluation (Signed)
Occupational Therapy Evaluation Patient Details Name: Rachel [name] MRN: [id number] DOB: [birth year] Today's Date: 09/19/2015    History of Present Illness 65 y.o. female with a history of Rosai-Dorfman disease, resection of an occipital mass lesion, hypertension, paroxysmal atrial fibrillation, hyperlipidemia, schizophrenia and diabetes mellitus, presenting with new onset weakness involving right leg for 2 days. MRI of the brain showed findings indicative of acute to subacute left frontoparietal subarachnoid hemorrhage, and addition to enhancing mass lesions in the left middle cranial fossa and left lateral temporal region.    Clinical Impression   Pt reports she was independent with ADL PTA. Currently pt overall max assist +2 for sit <> stand and max assist for LB ADL. Pt presenting with bil UE weakness (R>L), cognitive deficits, and visual impairments impacting her independence and safety with ADL and functional mobility. Recommending CIR level therapies for follow up in order to maximize independence and safety with ADL and functional mobility prior to return home. Pt with very supportive husband at bedside during OT eval. Pt would benefit from continued skilled OT to address established goals.    Follow Up Recommendations  CIR;Supervision/Assistance - 24 hour    Equipment Recommendations  Other (comment) (TBD at next venue)    Recommendations for Other Services Rehab consult     Precautions / Restrictions Precautions Precautions: Fall Restrictions Weight Bearing Restrictions: No      Mobility Bed Mobility Overal bed mobility: Needs Assistance;+2 for physical assistance Bed Mobility: Rolling;Sidelying to Sit;Sit to Supine Rolling: Mod assist Sidelying to sit: Mod assist;+2 for physical assistance   Sit to supine: Max assist;+2 for physical assistance   General bed mobility comments: Assist for LEs to EOB and trunk control for supine <> sit. Assist to scoot R hip to  EOB.  Transfers Overall transfer level: Needs assistance Equipment used: 2 person hand held assist Transfers: Sit to/from Stand Sit to Stand: Max assist;+2 physical assistance         General transfer comment: With use of bed pad and blocking bil knees.    Balance Overall balance assessment: Needs assistance Sitting-balance support: Feet supported;No upper extremity supported Sitting balance-Leahy Scale: Poor Sitting balance - Comments: R lateral and posterior lean in sitting. Needs assist to correct. Postural control: Posterior lean;Right lateral lean Standing balance support: Bilateral upper extremity supported Standing balance-Leahy Scale: Zero                              ADL Overall ADL's : Needs assistance/impaired     Grooming: Moderate assistance;Sitting Grooming Details (indicate cue type and reason): Assist for balance in sitting. Upper Body Bathing: Moderate assistance;Sitting   Lower Body Bathing: Maximal assistance;+2 for physical assistance;Sit to/from stand   Upper Body Dressing : Moderate assistance;Sitting   Lower Body Dressing: Maximal assistance;+2 for physical assistance;Sit to/from stand               Functional mobility during ADLs: Maximal assistance;+2 for physical assistance (for sit to stand only) General ADL Comments: Discussed post acute rehab with pt and husband; they are agreeable.     Vision Vision Assessment?: Vision impaired- to be further tested in functional context   Perception     Praxis      Pertinent Vitals/Pain Pain Assessment: No/denies pain     Hand Dominance Right   Extremity/Trunk Assessment Upper Extremity Assessment Upper Extremity Assessment: RUE deficits/detail;LUE deficits/detail RUE Deficits / Details: Grossly 3+/5. Decreased grip strengh. LUE Deficits /  Details: Generally weak. Overall 4/5.   Lower Extremity Assessment Lower Extremity Assessment: Defer to PT evaluation   Cervical /  Trunk Assessment Cervical / Trunk Assessment: Kyphotic   Communication Communication Communication: Other (comment) (minimal speech)   Cognition Arousal/Alertness: Awake/alert Behavior During Therapy: Flat affect Overall Cognitive Status: Impaired/Different from baseline Area of Impairment: Attention;Following commands;Safety/judgement;Awareness;Problem solving       Following Commands: Follows one step commands consistently;Follows one step commands with increased time Safety/Judgement: Decreased awareness of deficits Awareness: Intellectual Problem Solving: Slow processing;Decreased initiation;Difficulty sequencing;Requires verbal cues;Requires tactile cues     General Comments       Exercises       Shoulder Instructions      Home Living Family/patient expects to be discharged to:: Private residence Living Arrangements: Spouse/significant other Available Help at Discharge: Family;Available 24 hours/day Type of Home: House Home Access: Stairs to enter [employer] of Steps: 3 Entrance Stairs-Rails: Right;Left;Can reach both Home Layout: One level     Bathroom Shower/Tub: Teacher, early years/pre: Standard     Home Equipment: Environmental consultant - 2 wheels;Cane - single point          Prior Functioning/Environment Level of Independence: Needs assistance  Gait / Transfers Assistance Needed: Husband occasionally provides hand held assist for community mobility. ADL's / Homemaking Assistance Needed: Husband assists minimally with bathing and dressing.         OT Diagnosis: Generalized weakness;Cognitive deficits;Hemiplegia dominant side;Blindness and low vision   OT Problem List: Decreased strength;Decreased range of motion;Decreased activity tolerance;Impaired balance (sitting and/or standing);Impaired vision/perception;Decreased coordination;Decreased cognition;Decreased safety awareness;Decreased knowledge of use of DME or AE;Decreased knowledge of  precautions;Impaired UE functional use   OT Treatment/Interventions: Self-care/ADL training;Therapeutic exercise;Neuromuscular education;Energy conservation;DME and/or AE instruction;Therapeutic activities;Cognitive remediation/compensation;Visual/perceptual remediation/compensation;Patient/family education;Balance training    OT Goals(Current goals can be found in the care plan section) Acute Rehab OT Goals Patient Stated Goal: return to functional independence OT Goal Formulation: With patient/family Time For Goal Achievement: 10/03/15 Potential to Achieve Goals: Good ADL Goals Pt Will Perform Grooming: with supervision;sitting Pt Will Perform Upper Body Bathing: with supervision;sitting Pt Will Perform Lower Body Bathing: with min assist;sit to/from stand Pt Will Transfer to Toilet: with mod assist;ambulating;bedside commode  OT Frequency: Min 2X/week   Barriers to D/C:            Co-evaluation PT/OT/SLP Co-Evaluation/Treatment: Yes Reason for Co-Treatment: Complexity of the patient's impairments (multi-system involvement);For patient/therapist safety   OT goals addressed during session: ADL's and self-care      End of Session Nurse Communication: Mobility status  Activity Tolerance: Patient tolerated treatment well Patient left: in bed;with call bell/phone within reach;with bed alarm set;with family/visitor present   Time: [id number] OT Time Calculation (min): 24 min Charges:  OT General Charges $OT Visit: 1 Procedure OT Evaluation $OT Eval Moderate Complexity: 1 Procedure G-Codes:     Binnie Kand M.S., OTR/L Pager: [phone]  09/19/2015, 5:50 PM

## 2015-09-19 NOTE — Progress Notes (Signed)
rn explained to pt and family that EEG could not be performed with hairpiece on. rn attempted to detach hairpiece from pts head with lotion and universal wipe remover. Not able to get hairpiece fully off. Told pt and husband that if EEG was to be performed rn would have to cut the  sections of hairpiece that were attached with glue to pts small amount of hair. Husband said that was fine. Hairpiece fully intact and removed. Given to husband who put it in a pt belonging bag.  Husband asked for hairnet/ bonnet for pt. rn got blue hair cover and put on pt. rn called EEG to let them know pt could get EEG now.   Transport now here and taking pt back down to EEG.

## 2015-09-19 NOTE — CV Procedure (Signed)
History: Rachel [name] is a 65 year old patient with a history of Rosai-Dorfman disease, intracranial mass lesions, and subarachnoid hemorrhage.  Sedation: Xanax  Technique: This is a 21 channel routine scalp EEG performed at the bedside with bipolar and monopolar montages arranged in accordance to the international 10/20 system of electrode placement. One channel was dedicated to EKG recording.    Background: The background consists of intermixed theta activities. There is a well defined posterior dominant rhythm of 6-7 Hz that attenuates with eye opening. Sleep is recorded with normal appearing structures.   Photic stimulation: Not performed  EEG Abnormalities: Posterior background slowing with no epileptiform discharges.  Clinical Interpretation: This was an abnormal EEG due to the presence of mild posterior background slowing. The presence of mild posterior background slowing is a nonspecific finding and is indicative of mild encephalopathy  Dr. Gerda Diss. Tasia Catchings, MD Neurohospitalist

## 2015-09-19 NOTE — Progress Notes (Signed)
EEG completed, results pending. 

## 2015-09-19 NOTE — Progress Notes (Addendum)
Paged neuro team. Pt today will not open eyes or respond to verbal stimuli. Minimal response to painful stimuli in arms and legs. Unsure any movement to right side. rn noted pt moved left arm when painful stimuli to right arm. Pt moved left leg when painful stimuli applied to right leg.  Slightly opened eyes once with continued painful stimuli. rn had to physically open pts eyes to check pupils. Pupils barely to not reactive to light. Had another rn check pupils as well. Rechecked vital signs- stable, CBG rechecked and within normal limits.    Tech has also tried to wake up pt this morning with no success.

## 2015-09-19 NOTE — Progress Notes (Signed)
OT Cancellation Note  Patient Details Name: IVYONA BRUSKY MRN: [id number] DOB: Aug 10, 1950   Cancelled Treatment:    Reason Eval/Treat Not Completed: Patient not medically ready. Pt with decline in functional status. Currently at CT for further testing. Will follow up for OT eval as time allows.  Binnie Kand M.S., OTR/L Pager: [phone]  09/19/2015, 12:14 PM

## 2015-09-19 NOTE — Evaluation (Signed)
Physical Therapy Evaluation Patient Details Name: Rachel [name] MRN: [id number] DOB: 1950/03/15 Today's Date: 09/19/2015   History of Present Illness  65 y.o. female with a history of Rosai-Dorfman disease, resection of an occipital mass lesion, hypertension, paroxysmal atrial fibrillation, hyperlipidemia, schizophrenia and diabetes mellitus, presenting with new onset weakness involving right leg for 2 days. MRI of the brain showed findings indicative of acute to subacute left frontoparietal subarachnoid hemorrhage, and addition to enhancing mass lesions in the left middle cranial fossa and left lateral temporal region.   Clinical Impression  Pt admitted with/for SAH.  Pt currently limited functionally due to the problems listed. ( See problems list.)   Pt will benefit from PT to maximize function and safety in order to get ready for next venue listed below.     Follow Up Recommendations CIR    Equipment Recommendations  Other (comment) (TBD)    Recommendations for Other Services Rehab consult     Precautions / Restrictions Precautions Precautions: Fall Restrictions Weight Bearing Restrictions: No      Mobility  Bed Mobility Overal bed mobility: Needs Assistance;+2 for physical assistance Bed Mobility: Rolling;Sidelying to Sit;Sit to Supine Rolling: Mod assist Sidelying to sit: Mod assist;+2 for physical assistance   Sit to supine: Max assist;+2 for physical assistance   General bed mobility comments: Assist for LEs to EOB and trunk control for supine <> sit. Assist to scoot R hip to EOB.  Transfers Overall transfer level: Needs assistance Equipment used: 2 person hand held assist Transfers: Sit to/from Stand Sit to Stand: Max assist;+2 physical assistance         General transfer comment: With use of bed pad and blocking bil knees.  Ambulation/Gait             General Gait Details: not able  Stairs            Wheelchair Mobility    Modified  Rankin (Stroke Patients Only) Modified Rankin (Stroke Patients Only) Pre-Morbid Rankin Score: No symptoms Modified Rankin: Severe disability     Balance Overall balance assessment: Needs assistance Sitting-balance support: Feet supported Sitting balance-Leahy Scale: Poor Sitting balance - Comments: R lateral and posterior lean in sitting. Needs assist to correct. Postural control: Posterior lean;Right lateral lean Standing balance support: Bilateral upper extremity supported Standing balance-Leahy Scale: Zero Standing balance comment: needed support or guarding of R LE                             Pertinent Vitals/Pain Pain Assessment: No/denies pain    Home Living Family/patient expects to be discharged to:: Private residence Living Arrangements: Spouse/significant other Available Help at Discharge: Family;Available 24 hours/day Type of Home: House Home Access: Stairs to enter Entrance Stairs-Rails: Right;Left;Can reach both Entrance Stairs-Number of Steps: 3 Home Layout: One level Home Equipment: Walker - 2 wheels;Cane - single point      Prior Function Level of Independence: Needs assistance   Gait / Transfers Assistance Needed: Husband occasionally provides hand held assist for community mobility.  ADL's / Homemaking Assistance Needed: Husband assists minimally with bathing and dressing.         Hand Dominance   Dominant Hand: Right    Extremity/Trunk Assessment   Upper Extremity Assessment: RUE deficits/detail;LUE deficits/detail RUE Deficits / Details: Grossly 3+/5. Decreased grip strengh.     LUE Deficits / Details: Generally weak. Overall 4/5.   Lower Extremity Assessment: RLE deficits/detail;LLE deficits/detail RLE Deficits / Details:  grossly 3-/5 LLE Deficits / Details: general weakness  Cervical / Trunk Assessment: Kyphotic  Communication   Communication: Other (comment) (minimal speech)  Cognition Arousal/Alertness:  Awake/alert Behavior During Therapy: Flat affect Overall Cognitive Status: Impaired/Different from baseline Area of Impairment: Attention;Following commands;Safety/judgement;Awareness;Problem solving       Following Commands: Follows one step commands consistently;Follows one step commands with increased time Safety/Judgement: Decreased awareness of deficits Awareness: Intellectual Problem Solving: Slow processing;Decreased initiation;Difficulty sequencing;Requires verbal cues;Requires tactile cues      General Comments      Exercises        Assessment/Plan    PT Assessment Patient needs continued PT services  PT Diagnosis Generalized weakness (R LE paretic)   PT Problem List Decreased strength;Decreased activity tolerance;Decreased balance;Decreased mobility;Decreased coordination;Decreased knowledge of use of DME  PT Treatment Interventions Gait training;DME instruction;Functional mobility training;Therapeutic activities;Balance training;Neuromuscular re-education;Patient/family education   PT Goals (Current goals can be found in the Care Plan section) Acute Rehab PT Goals Patient Stated Goal: return to functional independence PT Goal Formulation: With patient Time For Goal Achievement: 10/03/15 Potential to Achieve Goals: Good    Frequency Min 4X/week   Barriers to discharge        Co-evaluation PT/OT/SLP Co-Evaluation/Treatment: Yes Reason for Co-Treatment: Complexity of the patient's impairments (multi-system involvement) PT goals addressed during session: Mobility/safety with mobility OT goals addressed during session: ADL's and self-care       End of Session   Activity Tolerance: Patient tolerated treatment well;Patient limited by fatigue Patient left: in bed Nurse Communication: Mobility status         Time: [id number] PT Time Calculation (min) (ACUTE ONLY): 24 min   Charges:   PT Evaluation $PT Eval Moderate Complexity: 1 Procedure     PT G  Codes:        Vinia Jemmott, Tessie Fass 09/19/2015, 7:04 PM 09/19/2015  Donnella Sham, [place] [phone]  (pager)

## 2015-09-19 NOTE — Progress Notes (Signed)
PT Cancellation Note  Patient Details Name: Rachel [name] MRN: [id number] DOB: 02/25/1950   Cancelled Treatment:    Reason Eval/Treat Not Completed: Medical issues which prohibited therapy.  Pt barely responsive to noxious stimuli.  Pt going for more imagining studies.  Will see 8/11 as able. October 17, 2015  Rachel [name], [place] [phone]  (pager)   Lebaron Bautch, Tessie Fass 2015-10-17, 10:48 AM

## 2015-09-19 NOTE — Telephone Encounter (Signed)
phone call from patient's husband.  cancelled appointment for 09/23/15 due to patient is in hospital for rehab.   He did not wish to reschedule until he knows when she will be released.

## 2015-09-19 NOTE — Progress Notes (Signed)
Patient has weave glued in hair, EEG unable to be completed at this time. RN notified

## 2015-09-20 DIAGNOSIS — I480 Paroxysmal atrial fibrillation: Secondary | ICD-10-CM

## 2015-09-20 DIAGNOSIS — I609 Nontraumatic subarachnoid hemorrhage, unspecified: Secondary | ICD-10-CM

## 2015-09-20 DIAGNOSIS — D763 Other histiocytosis syndromes: Secondary | ICD-10-CM

## 2015-09-20 DIAGNOSIS — G9389 Other specified disorders of brain: Secondary | ICD-10-CM

## 2015-09-20 DIAGNOSIS — G939 Disorder of brain, unspecified: Secondary | ICD-10-CM

## 2015-09-20 DIAGNOSIS — E119 Type 2 diabetes mellitus without complications: Secondary | ICD-10-CM

## 2015-09-20 DIAGNOSIS — F209 Schizophrenia, unspecified: Secondary | ICD-10-CM

## 2015-09-20 DIAGNOSIS — H547 Unspecified visual loss: Secondary | ICD-10-CM

## 2015-09-20 DIAGNOSIS — H540 Blindness, both eyes: Secondary | ICD-10-CM

## 2015-09-20 DIAGNOSIS — G9340 Encephalopathy, unspecified: Secondary | ICD-10-CM

## 2015-09-20 LAB — GLUCOSE, CAPILLARY
GLUCOSE-CAPILLARY: 154 mg/dL — AB (ref 65–99)
GLUCOSE-CAPILLARY: 268 mg/dL — AB (ref 65–99)
Glucose-Capillary: 170 mg/dL — ABNORMAL HIGH (ref 65–99)
Glucose-Capillary: 335 mg/dL — ABNORMAL HIGH (ref 65–99)

## 2015-09-20 LAB — HEMOGLOBIN A1C
Hgb A1c MFr Bld: 7.9 % — ABNORMAL HIGH (ref 4.8–5.6)
Mean Plasma Glucose: 180 mg/dL

## 2015-09-20 NOTE — Care Management Note (Signed)
Case Management Note  Patient Details  Name: Rachel [name] MRN: [id number] Date of Birth: 1950-08-13  Subjective/Objective:  Pt with Rosai-dorfman disease. She is from home with her spouse.                   Action/Plan: PT/OT recommending CIR. CM following for d/c needs.   Expected Discharge Date:                  Expected Discharge Plan:  [place]  In-House Referral:     Discharge planning Services     Post Acute Care Choice:    Choice offered to:     DME Arranged:    DME Agency:     HH Arranged:    [place] Agency:     Status of Service:  In process, will continue to follow  If discussed at Long Length of Stay Meetings, dates discussed:    Additional Comments:  Pollie Friar, RN 09/20/2015, 3:43 PM

## 2015-09-20 NOTE — Progress Notes (Signed)
Inpatient Diabetes Program Recommendations  AACE/ADA: New Consensus Statement on Inpatient Glycemic Control (2015)  Target Ranges:  Prepandial:   less than 140 mg/dL      Peak postprandial:   less than 180 mg/dL (1-2 hours)      Critically ill patients:  140 - 180 mg/dL   Results for Rachel [name], Rachel [name] (MRN [id number]) as of 09/20/2015 11:40  Ref. Range 09/19/2015 06:47 09/19/2015 09:19 09/19/2015 11:09 09/19/2015 16:25 09/19/2015 21:12  Glucose-Capillary Latest Ref Range: 65 - 99 mg/dL 157 (H) 131 (H) 121 (H) 273 (H) 277 (H)   Results for Rachel [name], Rachel [name] (MRN [id number]) as of 09/20/2015 11:40  Ref. Range 09/20/2015 06:21 09/20/2015 11:11  Glucose-Capillary Latest Ref Range: 65 - 99 mg/dL 170 (H) 154 (H)    Home DM Meds: NPH Insulin: 12-14 units bid  Current Insulin Orders: NPH Insulin: 12-14 units bid      Novolog Sensitive Correction Scale/ SSI (0-9 units) TID AC + HS       MD- There are currently no administration parameters for the RN to follow with the NPH Insulin orders.  The RN has held the AM dose of NPH insulin the last 2 days (08/10 and 08/11).  Patient did receive 13 units NPH last PM.  Please consider the following:   1. Change NPH Insulin orders to 12 units bid (breakfast and bedtime)  2. Continue Novolog Sensitive Correction Scale/ SSI (0-9 units) TID AC + HS     --Will follow patient during hospitalization--  Wyn Quaker RN, MSN, CDE Diabetes Coordinator Inpatient Glycemic Control Team Team Pager: [phone] (8a-5p)

## 2015-09-20 NOTE — Progress Notes (Signed)
Physical Therapy Treatment Patient Details Name: Rachel [name] MRN: [id number] DOB: 10/17/1950 Today's Date: 09/20/2015    History of Present Illness 65 y.o. female with a history of Rosai-Dorfman disease, resection of an occipital mass lesion, hypertension, paroxysmal atrial fibrillation, hyperlipidemia, schizophrenia and diabetes mellitus, presenting with new onset weakness involving right leg for 2 days. MRI of the brain showed findings indicative of acute to subacute left frontoparietal subarachnoid hemorrhage, and addition to enhancing mass lesions in the left middle cranial fossa and left lateral temporal region.     PT Comments    Pt is slow to progress.  Emphasis placed on warm up ROM to extremities, sitting balance, standing trials and transfer to the chair.  Pt's aspect very flat with long delays in response to questions.   Follow Up Recommendations  CIR     Equipment Recommendations       Recommendations for Other Services Rehab consult     Precautions / Restrictions Precautions Precautions: Fall    Mobility  Bed Mobility Overal bed mobility: Needs Assistance;+2 for physical assistance Bed Mobility: Rolling;Sidelying to Sit Rolling: Mod assist Sidelying to sit: Mod assist;+2 for physical assistance       General bed mobility comments: Assist for LEs to EOB and trunk control for supine <> sit.   Transfers Overall transfer level: Needs assistance Equipment used: 2 person hand held assist Transfers: Sit to/from [employer] Sit to Stand: Max assist;+2 physical assistance   Squat pivot transfers: Max assist;+2 physical assistance     General transfer comment: standing x3 working on upright stance, R knee control.  pt needed assist to come both forward and up.  Ambulation/Gait             General Gait Details: not able   Stairs            Wheelchair Mobility    Modified Rankin (Stroke Patients Only) Modified Rankin  (Stroke Patients Only) Pre-Morbid Rankin Score: No symptoms Modified Rankin: Severe disability     Balance     Sitting balance-Leahy Scale: Poor Sitting balance - Comments: sat EOB x 10 min working on upright sitting, and coming forward into midline.  Pt has tendency to list posteriorly and to the right.     Standing balance-Leahy Scale: Zero Standing balance comment: standing x2 with need for R LE support and significant truncal assist otherwise.                    Cognition Arousal/Alertness: Lethargic;Awake/alert Behavior During Therapy: Flat affect Overall Cognitive Status: Impaired/Different from baseline Area of Impairment: Attention;Following commands;Safety/judgement;Awareness;Problem solving   Current Attention Level: Sustained   Following Commands: Follows one step commands with increased time Safety/Judgement: Decreased awareness of deficits Awareness: Intellectual Problem Solving: Slow processing;Decreased initiation;Difficulty sequencing;Requires verbal cues;Requires tactile cues      Exercises      General Comments        Pertinent Vitals/Pain Pain Assessment: No/denies pain    Home Living                      Prior Function            PT Goals (current goals can now be found in the care plan section) Acute Rehab PT Goals Patient Stated Goal: return to functional independence PT Goal Formulation: With patient Time For Goal Achievement: 10/03/15 Potential to Achieve Goals: Good Progress towards PT goals: Progressing toward goals    Frequency  Min 4X/week  PT Plan Current plan remains appropriate    Co-evaluation             End of Session   Activity Tolerance: Patient limited by fatigue Patient left: in chair;with call bell/phone within reach;with chair alarm set     Time: [id number] PT Time Calculation (min) (ACUTE ONLY): 34 min  Charges:  $Therapeutic Activity: 23-37 mins                    G Codes:       Cordarious Zeek, Tessie Fass 09/20/2015, 1:45 PM  09/20/2015  Donnella Sham, [place] [phone]  (pager)

## 2015-09-20 NOTE — Progress Notes (Signed)
Speech Language Pathology Treatment: Cognitive-Linquistic  Patient Details Name: Rachel [name] MRN: [id number] DOB: 11/08/1950 Today's Date: 09/20/2015 Time: [id number] SLP Time Calculation (min) (ACUTE ONLY): 15 min  Assessment / Plan / Recommendation Clinical Impression  Cognitive - linguistic treatment provided to monitor progress, provide verbal/ visual/ contextual cues for orientation, awareness, and basic recall/ problem solving. Pt has made minimal progress since evaluation on 8/9. Oriented to self and location, continues to be disoriented to time and situation despite contextual cues (husband at bedside reported that pt did not have cognitive deficits at baseline but that pt did have difficulty keeping track of the date PTA). Pt located call bell button with min cues, unable to utilize TV remote. Pt continues to benefit from extra processing time for responses, did demonstrate decreased selective attention at times and needed prompts/ questions repeated. Will continue to follow for cognitive-linguistic treatment.   HPI HPI: Pt is a 65 y.o. female with PMH of Rosai-Dorfman disease, resection of an occipital mass lesion, hypertension, paroxysmal atrial fibrillation, hyperlipidemia, schizophrenia and diabetes mellitus, presenting with new onset weakness involving right leg for 2 days, MRI of the brain showed findings indicative of acute to subacute left frontoparietal subarachnoid hemorrhage, and addition to enhancing mass lesions in the left middle cranial fossa and left lateral temporal region.       SLP Plan  Continue with current plan of care     Recommendations                Follow up Recommendations: Inpatient Rehab Plan: Continue with current plan of care     [place], Amy K, [place], CCC-SLP 09/20/2015, 3:45 PM [phone]

## 2015-09-20 NOTE — Progress Notes (Addendum)
Inpatient Rehabilitation  Will plan to follow along for completion of medical work-up, caregiver support, and insurance authorization.  Please see IP Rehab MD consult for full details.  Plan for one of my team mates to follow up next week.   Carmelia Roller., CCC/SLP Admission Coordinator  [place]  Cell [phone]

## 2015-09-20 NOTE — Progress Notes (Signed)
This morning, similar to yesterday morning pt would not open eyes or respond to nurse during 8 oclock assessment. Pt did slightly squeeze rn fingers when rn placed fingers in pts hands. Later in morning tech reported that pt did open eyes a little bit during bed bath, and when asked if she wanted to eat breakfast pt nodded no.  Pt became more alert as day progressed. Pt got pt up to chair. Husband at bedside at lunch time and per husband pt is not a morning person. At lunch pt alert and talking with nurse following commands. Still flat affect, but able to state name and birthdate. Pt still has some right sided weakness. Morning meds were held until around noon due to pt not being fully alert until then. rn did go to attempt giving meds at 1000 but pt was still sleeping and not talking when spoken to.

## 2015-09-20 NOTE — Progress Notes (Signed)
Rehab Admissions Coordinator Note:  Patient was screened by Retta Diones for appropriateness for an Inpatient Acute Rehab Consult.  At this time, we are recommending Inpatient Rehab consult.  Retta Diones 09/20/2015, 10:12 AM  I can be reached at [phone].

## 2015-09-20 NOTE — Consult Note (Signed)
Physical Medicine and Rehabilitation Consult Reason for Consult: Acute/subacute left frontoparietal subarachnoid hemorrhage Referring Physician: Dr. Leonie Man  HPI: Rachel [name] is a 65 y.o. 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 Per chart review patient lives with spouse. One level home 3 steps to entry. Used a cane prior to admission. Husband provides hand held assistance for community mobility. Husband assists with bathing and dressing. Presented 09/17/2015 with right-sided weakness. MRI of the brain showed findings indicating of acute to subacute left frontoparietal subarachnoid hemorrhage in addition to enhancing mass lesions in the left middle cranial fossa and left lateral temporal regions. Postsurgical changes in the occipital region noted. Patient did not receive TPA. The lesions of the left frontoparietal medial likely traumatic SAH not felt to be aneurysm or vascular abnormality as discussed with neurosurgery Dr. Cyndy Freeze. EEG completed showing posterior background slowing nonspecific indicator of mild encephalopathy. No seizure activity. Neurology consulted aspirin held secondary to [facility]. Tolerating a regular diet. Physical therapy evaluation completed 09/19/2015 with recommendations of physical medicine rehabilitation consult.  Review of Systems  Constitutional: Negative for chills and fever.       Minimal communication  HENT: Negative for hearing loss.   Eyes:       Blindness right eye  Respiratory: Positive for cough. Negative for shortness of breath.   Cardiovascular: Positive for palpitations.  Gastrointestinal: Positive for constipation. Negative for nausea.       GERD  Genitourinary: Negative for dysuria.  Musculoskeletal: Positive for myalgias.  Skin: Negative for  rash.  Neurological: Positive for speech change, focal weakness, weakness and headaches. Negative for sensory change and seizures.       Vertigo  All other systems reviewed and are negative.  Past Medical History:  Diagnosis Date  . Blindness   . Diabetes mellitus type I ([place])   . GERD (gastroesophageal reflux disease)   . Hyperlipidemia   . Paroxysmal atrial fibrillation ([facility])   . Schizophrenia ([place])   . Systemic hypertension   . Vertigo    Past Surgical History:  Procedure Laterality Date  . BRAIN TUMOR EXCISION    . NM MYOCAR PERF WALL MOTION  02/01/2007   no significant ischemia  . US ECHOCARDIOGRAPHY  12/20/2003   mild mitral annular ca+,mild MR,TR,PI,AOV mildly sclerotic   Family History  Problem Relation Age of Onset  . ADD / ADHD Neg Hx   . Alcohol abuse Neg Hx   . Drug abuse Neg Hx   . Anxiety disorder Neg Hx   . Bipolar disorder Neg Hx   . Dementia Neg Hx   . Depression Neg Hx   . OCD Neg Hx   . Paranoid behavior Neg Hx   . Schizophrenia Neg Hx   . Seizures Neg Hx   . Sexual abuse Neg Hx   . Physical abuse Neg Hx    Social History:  reports that she has never smoked. She has never used smokeless tobacco. She reports that she does not drink alcohol or use drugs. Allergies:  Allergies  Allergen Reactions  . Sulfa Antibiotics Anaphylaxis  . Ibuprofen Other (See Comments)    unknown  . Shellfish Allergy Swelling    Mouth   Medications Prior to Admission  Medication Sig Dispense Refill  . acetaminophen (TYLENOL) 500 MG tablet Take 1,000 mg by mouth every  6 (six) hours as needed for mild pain.    Marland Kitchen ALPRAZolam (XANAX) 0.25 MG tablet Take 1 tablet (0.25 mg total) by mouth 3 (three) times daily. 270 tablet 1  . aspirin 81 MG tablet Take 81 mg by mouth daily at 6 PM.     . digoxin (LANOXIN) 0.125 MG tablet Take 1 tablet (0.125 mg total) by mouth daily. 90 tablet 3  . diltiazem (CARDIZEM CD) 180 MG 24 hr capsule Take 1 capsule (180 mg total) by mouth daily. 90  capsule 3  . famotidine (PEPCID) 20 MG tablet Take 20 mg by mouth daily.     . insulin NPH Human (HUMULIN N,NOVOLIN N) 100 UNIT/ML injection Inject 12-14 Units into the skin 2 (two) times daily.    Marland Kitchen KLOR-CON 10 10 MEQ tablet Take 10 mEq by mouth daily.     Marland Kitchen loratadine (CLARITIN) 10 MG tablet Take 10 mg by mouth daily as needed for allergies.   1  . meclizine (ANTIVERT) 25 MG tablet Take 25 mg by mouth daily.    Marland Kitchen PARoxetine (PAXIL) 40 MG tablet Take 1 tablet (40 mg total) by mouth daily. (Patient taking differently: Take 40 mg by mouth at bedtime. ) 90 tablet 2  . risperiDONE (RISPERDAL) 2 MG tablet Take 1 tablet (2 mg total) by mouth at bedtime. 90 tablet 2  . rosuvastatin (CRESTOR) 10 MG tablet Take 1 tablet (10 mg total) by mouth daily. (Patient taking differently: Take 10 mg by mouth at bedtime. ) 90 tablet 3  . traZODone (DESYREL) 150 MG tablet Take 75 mg by mouth at bedtime.      Home: Home Living Family/patient expects to be discharged to:: Private residence Living Arrangements: Spouse/significant other Available Help at Discharge: Family, Available 24 hours/day Type of Home: House Home Access: Stairs to enter [employer] of Steps: 3 Entrance Stairs-Rails: Right, Left, Can reach both Home Layout: One level Bathroom Shower/Tub: Chiropodist: Standard Home Equipment: Environmental consultant - 2 wheels, Cane - single point  Functional History: Prior Function Level of Independence: Needs assistance Gait / Transfers Assistance Needed: Husband occasionally provides hand held assist for community mobility. ADL's / Homemaking Assistance Needed: Husband assists minimally with bathing and dressing.  Functional Status:  Mobility: Bed Mobility Overal bed mobility: Needs Assistance, +2 for physical assistance Bed Mobility: Rolling, Sidelying to Sit, Sit to Supine Rolling: Mod assist Sidelying to sit: Mod assist, +2 for physical assistance Sit to supine: Max assist, +2  for physical assistance General bed mobility comments: Assist for LEs to EOB and trunk control for supine <> sit. Assist to scoot R hip to EOB. Transfers Overall transfer level: Needs assistance Equipment used: 2 person hand held assist Transfers: Sit to/from Stand Sit to Stand: Max assist, +2 physical assistance General transfer comment: With use of bed pad and blocking bil knees. Ambulation/Gait General Gait Details: not able    ADL: ADL Overall ADL's : Needs assistance/impaired Grooming: Moderate assistance, Sitting Grooming Details (indicate cue type and reason): Assist for balance in sitting. Upper Body Bathing: Moderate assistance, Sitting Lower Body Bathing: Maximal assistance, +2 for physical assistance, Sit to/from stand Upper Body Dressing : Moderate assistance, Sitting Lower Body Dressing: Maximal assistance, +2 for physical assistance, Sit to/from stand Functional mobility during ADLs: Maximal assistance, +2 for physical assistance (for sit to stand only) General ADL Comments: Discussed post acute rehab with pt and husband; they are agreeable.  Cognition: Cognition Overall Cognitive Status: Impaired/Different from baseline Arousal/Alertness: Lethargic Orientation Level:  Oriented to person, Disoriented to place, Disoriented to time, Disoriented to situation Attention: Sustained Sustained Attention: Impaired Sustained Attention Impairment: Verbal basic, Functional basic Memory: Impaired Memory Impairment: Decreased recall of new information, Retrieval deficit Awareness: Impaired Awareness Impairment: Intellectual impairment Problem Solving: Appears intact Executive Function:  (all impaired due to underlying limitation) Safety/Judgment: Impaired Comments: pt demonstrated poor initiation with all tasks, delayed processing Cognition Arousal/Alertness: Awake/alert Behavior During Therapy: Flat affect Overall Cognitive Status: Impaired/Different from baseline Area of  Impairment: Attention, Following commands, Safety/judgement, Awareness, Problem solving Following Commands: Follows one step commands consistently, Follows one step commands with increased time Safety/Judgement: Decreased awareness of deficits Awareness: Intellectual Problem Solving: Slow processing, Decreased initiation, Difficulty sequencing, Requires verbal cues, Requires tactile cues  Blood pressure (!) 143/62, pulse 69, temperature 98.6 F (37 C), temperature source Oral, resp. rate 16, height 5' 7"  (1.702 m), weight 81.5 kg (179 lb 11.2 oz), SpO2 99 %. Physical Exam  Vitals reviewed. Constitutional: She appears well-developed.  Limited communication  HENT:  Head: Normocephalic and atraumatic.  Eyes: Right eye exhibits no discharge. Left eye exhibits no discharge.  Blindness right eye  Neck: Normal range of motion. Neck supple. No thyromegaly present.  Cardiovascular: Normal rate and regular rhythm.   Respiratory: Effort normal.  Decreased breath sounds at the bases  GI: Soft. Bowel sounds are normal. She exhibits no distension.  Musculoskeletal: She exhibits no edema or tenderness.  Neurological: She is alert.  She was essentially nonverbal but did state her first name.  Follow limited commands. Sensation intact to light touch DTRs symmetric Motor: RUE/RLE 0/5 LUE: 4/5 proximal to distal LLE: 4-/5 hip flexion, knee extension, 4/5 ankle dorsi/plantar flexion  Skin: Skin is warm and dry.  Psychiatric: Her affect is blunt. Her speech is delayed (with limited communication). She is slowed and withdrawn. She exhibits a depressed mood. She is inattentive.    Results for orders placed or performed during the hospital encounter of 09/17/15 (from the past 24 hour(s))  Glucose, capillary     Status: Abnormal   Collection Time: 09/19/15 11:09 AM  Result Value Ref Range   Glucose-Capillary 121 (H) 65 - 99 mg/dL   Comment 1 Notify RN    Comment 2 Document in Chart   Glucose,  capillary     Status: Abnormal   Collection Time: 09/19/15  4:25 PM  Result Value Ref Range   Glucose-Capillary 273 (H) 65 - 99 mg/dL  Glucose, capillary     Status: Abnormal   Collection Time: 09/19/15  9:12 PM  Result Value Ref Range   Glucose-Capillary 277 (H) 65 - 99 mg/dL   Comment 1 Notify RN    Comment 2 Document in Chart   Glucose, capillary     Status: Abnormal   Collection Time: 09/20/15  6:21 AM  Result Value Ref Range   Glucose-Capillary 170 (H) 65 - 99 mg/dL   Comment 1 Notify RN    Comment 2 Document in Chart    Ct Head Wo Contrast  Result Date: 09/19/2015 CLINICAL DATA:  Follow-up subarachnoid hemorrhage. EXAM: CT HEAD WITHOUT CONTRAST TECHNIQUE: Contiguous axial images were obtained from the base of the skull through the vertex without intravenous contrast. COMPARISON:  CT head 09/18/2015 FINDINGS: Subarachnoid hemorrhage left medial convexity shows mild improvement since yesterday. No subdural hemorrhage. No new area of hemorrhage since the prior study. Moderate atrophy most prominent in the frontal and temporal lobes. Negative for hydrocephalus. Enhancing dural-based mass lesions in the middle cranial fossa better  seen on MRI. Prior occipital craniectomy. Heterogeneous skull with multiple tiny areas of low-density throughout the skull widely distributed and symmetric. This has progressed since 2015 was not present in 2006. This could be due to metabolic bone disease or myeloma. IMPRESSION: Improving subarachnoid hemorrhage left convexity. No new area of hemorrhage Diffusely abnormal calvarium of uncertain etiology. Recommend evaluation for multiple myeloma. Electronically Signed   By: Franchot Gallo M.D.   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 Abnormal brain MRI yesterday, with possible trace subarachnoid hemorrhage in the posterior left hemisphere,interhemispheric fissure. Initial  encounter. EXAM: CT HEAD WITHOUT CONTRAST TECHNIQUE: Contiguous axial images were obtained from the base of the skull through the vertex without intravenous contrast. COMPARISON:  Brain MRI 09/17/2015, noncontrast head CT 07/26/2015, and earlier. FINDINGS: Stable visualized osseous structures. Visualized paranasal sinuses and mastoids are stable and well pneumatized. Negative orbit and scalp soft tissues. Corresponding to the posterior left interhemispheric fissure abnormality on MRI of which was suggestive of hemorrhage there is new focal intermediate and height density (series 2, image 23), where previously normal CSF was present on 07/26/2015. The configuration of this is stable since yesterday. There is no significant regional mass effect. No intraventricular or new extra-axial hemorrhage or abnormality identified. Stable gray-white matter differentiation throughout the brain. Stable sequelae of suboccipital craniectomy. No cortically based acute infarct identified. IMPRESSION: 1. MRI abnormality yesterday in the posterior left interhemispheric fissure does correspond to abnormal intermediate to high density material on CT, new since 07/26/2015. This is most suspicious for a small volume of subarachnoid hemorrhage, with progression of the underlying chronic lymphoproliferative abnormality felt less likely. 2. Otherwise stable noncontrast CT appearance the brain since 07/26/2015. See also Brain MRI report from yesterday. Study discussed by telephone with Dr. Leonie Man On 09/18/2015 at 11:27 . Electronically Signed   By: Genevie Ann M.D.   On: 09/18/2015 11:35    Assessment/Plan: Diagnosis: Acute/subacute left frontoparietal subarachnoid hemorrhage Labs and images independently reviewed.  Records reviewed and summated above. Stroke: Continue secondary stroke prophylaxis and Risk Factor Modification listed below:   Blood Pressure Management:  Continue current medication with prn's with permisive HTN per primary  team Diabetes management:   Right sided hemiparesis: fit for orthosis to prevent contractures (resting hand splint for day, wrist cock up splint at night, PRAFO, etc)  NeuroPsych evaluation for behavorial assessment.  Provide environmental management by reducing the level of stimulation, tolerating restlessness when possible, protecting patient from harming self or others and reducing patient's cognitive confusion.  Address behavioral concerns include providing structured environments and daily routines.  Cognitive therapy to direct modular abilities in order to maintain goals  including problem solving, self regulation/monitoring, self management, attention, and memory.  Fall precautions; pt at risk for second impact syndrome  Prevention of secondary injury: monitor for hypotension, hypoxia, seizures or signs of increased ICP  Prophylactic AED  Consider pharmacological intervention if necessary with neurostimulants,  Such as amantadine, methylphenidate, modafinil, etc.  Avoid medications that could impair cognitive abilities, such as anticholinergics, antihistaminic, benzodiazapines, narcotics, etc when possible  1. Does the need for close, 24 hr/day medical supervision in concert with the patient's rehab needs make it unreasonable for this patient to be served in a less intensive setting? 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 Due to bladder management, bowel management, safety, skin/wound care, disease management, medication administration  and patient education, does the patient require 24 hr/day rehab nursing? Yes 3. Does the patient require coordinated care of a physician, rehab nurse, PT (1-2 hrs/day, 5 days/week), OT (1-2 hrs/day, 5 days/week) and SLP (1-2 hrs/day, 5 days/week) to address physical and functional deficits in the context of the above medical diagnosis(es)? Yes Addressing deficits in the following areas: balance, endurance, locomotion, strength, transferring, bowel/bladder control, bathing, dressing, feeding, grooming, toileting, cognition, speech, language, swallowing and psychosocial support 4. Can the patient actively participate in an intensive therapy program of at least 3 hrs of therapy per day at least 5 days per week? Not at present 5. The potential for patient to make measurable gains while on inpatient rehab is good 6. Anticipated functional outcomes upon discharge from inpatient rehab are min assist and mod assist  with PT, min assist and mod assist with OT, min assist and mod assist with SLP. 7. Estimated rehab length of stay to reach the above functional goals is: 20-24 days. 8. Does the patient have adequate social supports and living environment to accommodate these discharge functional goals? Potentially 9. Anticipated D/C setting: Home 10. Anticipated post D/C treatments: HH therapy and Home excercise program 11. Overall Rehab/Functional Prognosis: good and fair  RECOMMENDATIONS: This patient's condition is appropriate for continued rehabilitative care in the following setting: Will need to confirm caregiver availability at discharge, as pt will likely have significant functional needs.  If adequate caregiver availability, recommend CIR after completion of medical workup and medical plan (enhancing mass lesion) Patient has agreed to participate in recommended program. Potentially Note that insurance prior authorization may be required for reimbursement for recommended care.  Comment: Rehab Admissions  Coordinator to follow up.  Delice Lesch, MD 09/20/2015

## 2015-09-20 NOTE — Progress Notes (Signed)
STROKE TEAM PROGRESS NOTE   SUBJECTIVE (INTERVAL HISTORY) Her husband is at the bedside.  More alert this am.   OBJECTIVE Temp:  [97.9 F (36.6 C)-98.6 F (37 C)] 98.6 F (37 C) (08/11 0918) Pulse Rate:  [65-74] 69 (08/11 0918) Cardiac Rhythm: Normal sinus rhythm (08/11 0701) Resp:  [16-17] 16 (08/11 0918) BP: (136-143)/(50-67) 143/62 (08/11 0918) SpO2:  [99 %-100 %] 99 % (08/11 0918)  CBC:   Recent Labs Lab 09/17/15 1231  WBC 10.2  NEUTROABS 7.3  HGB 13.9  HCT 42.0  MCV 91.3  PLT 017    Basic Metabolic Panel:   Recent Labs Lab 09/17/15 1231  NA 137  K 3.9  CL 105  CO2 25  GLUCOSE 266*  BUN 6  CREATININE 0.89  CALCIUM 10.2    Lipid Panel:     Component Value Date/Time   CHOL 127 09/11/2013 1101   TRIG 45 09/11/2013 1101   HDL 59 09/11/2013 1101   CHOLHDL 2.2 09/11/2013 1101   VLDL 9 09/11/2013 1101   LDLCALC 59 09/11/2013 1101   HgbA1c:  Lab Results  Component Value Date   HGBA1C 7.9 (H) 09/19/2015   Urine Drug Screen:     Component Value Date/Time   LABOPIA NONE DETECTED 08/15/2015 1327   COCAINSCRNUR NONE DETECTED 08/15/2015 1327   LABBENZ POSITIVE (A) 08/15/2015 1327   AMPHETMU NONE DETECTED 08/15/2015 1327   THCU NONE DETECTED 08/15/2015 1327   LABBARB NONE DETECTED 08/15/2015 1327      IMAGING  Ct Head Wo Contrast  Result Date: 09/19/2015 CLINICAL DATA:  Follow-up subarachnoid hemorrhage. EXAM: CT HEAD WITHOUT CONTRAST TECHNIQUE: Contiguous axial images were obtained from the base of the skull through the vertex without intravenous contrast. COMPARISON:  CT head 09/18/2015 FINDINGS: Subarachnoid hemorrhage left medial convexity shows mild improvement since yesterday. No subdural hemorrhage. No new area of hemorrhage since the prior study. Moderate atrophy most prominent in the frontal and temporal lobes. Negative for hydrocephalus. Enhancing dural-based mass lesions in the middle cranial fossa better seen on MRI. Prior occipital  craniectomy. Heterogeneous skull with multiple tiny areas of low-density throughout the skull widely distributed and symmetric. This has progressed since 2015 was not present in 2006. This could be due to metabolic bone disease or myeloma. IMPRESSION: Improving subarachnoid hemorrhage left convexity. No new area of hemorrhage Diffusely abnormal calvarium of uncertain etiology. Recommend evaluation for multiple myeloma. Electronically Signed   By: Franchot Gallo M.D.   On: 09/19/2015 11:10   EEG  Posterior background slowing with no epileptiform discharges.  PHYSICAL EXAM Pleasant middle aged african american lady not in distress. . Afebrile. Head is nontraumatic. Neck is supple without bruit.    Cardiac exam no murmur or gallop. Lungs are clear to auscultation. Distal pulses are well felt. Neurological Exam :  Drowsy but awakens easily. oriented 3. Normal speech and language except slight nonfluent speech. No aphasia or dysarthria. Extraocular movements are full range without nystagmus. Blinks to threat bilaterally. 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 Slight restriction of peripheral vision in the left eye. Fundi were not visualized. Right lower facial weakness which is chronic. Tongue midline. Motor system exam no upper extremity drift. Slight diminished fine finger movements on the right compared to the left. Orbits left over right upper extremity. Right lower extremity weakness graded 2/5 strength. No left-sided weakness. Sensation is intact bilaterally. Plantars are both downgoing. Gait was not tested.   ASSESSMENT/PLAN Ms. Rachel [name] is a 65 y.o.  female with history of Rosai-Dorfman disease, resection of an occipital mass lesion, hypertension, paroxysmal atrial fibrillation, hyperlipidemia, schizophrenia and diabetes mellitus presenting with R leg weakness x 2 days. MRI showed L frontoparietal medial SAH.    recurrent R leg weakness with enhancing L frontal  parasaggital lesion felt to be Small localized subacute [facility]   Neurosurgery consult Dr. Leonie Man discussed at length with Dr. Christella Noa   MRI  Lesions left frontoparietal medial likely traumatic SAH  Doubt aneurysm or vascular abnormality. Dr. Christella Noa to discuss with neuroradiologist -> all agree subacute localized SAH. Rosai-Drofman lesions are stable.  CT head shows SAH  Repeat CT head this am when sleep unchanged  Carotid Doppler  Cancel  EEG generalized slowing, no seizure  SCDs for VTE prophylaxis Diet Carb Modified Fluid consistency: Thin; Room service appropriate? Yes  aspirin 81 mg daily prior to admission, now on No antithrombotic. Hold aspirin given SAH  Resume NIHSS  Therapy recommendations:  pending   Disposition:  pending   Paroxysmal Atrial Fibrillation  Home anticoagulation:  none now none due to h/o SAH and poor long term   Hyperlipidemia  Home meds:  crestor 10  Resumed statin in hospital   Continue statin at discharge  Diabetes type I  HgbA1c ordered, goal < 7.0  adding Novolog sensitive correction of 0-9 units tid with meals   CBG ac & hs.  Other Stroke Risk Factors  Overweight, Body mass index is 28.14 kg/m., recommend weight loss, diet and exercise as appropriate   Other Active Problems  Blind   Schizophrenia  Hx occipital brain tumor excison  CT head showed lytic skull lesions raising concern for myeloma versus metabolic bone disease  Hospital day # 3 I have personally examined this patient, reviewed notes, independently viewed imaging studies, participated in medical decision making and plan of care. I have made any additions or clarifications directly to the above note. Agree with note above. Patient is neurologically stable. Await transfer to rehabilitation over the next few days when bed available. Discussed with husband and answered questions. Check serum protein electrophoresis and 24 hr urine for BJ protein  For myeloma given lytic  skull lesions  Antony Contras, MD Medical Director [facility] Pager: [phone] 09/20/2015 3:03 PM     To contact Stroke Continuity provider, please refer to [email or web address] After hours, contact General NeurologY

## 2015-09-21 LAB — GLUCOSE, CAPILLARY
GLUCOSE-CAPILLARY: 295 mg/dL — AB (ref 65–99)
Glucose-Capillary: 222 mg/dL — ABNORMAL HIGH (ref 65–99)
Glucose-Capillary: 183 mg/dL — ABNORMAL HIGH (ref 65–99)
Glucose-Capillary: 230 mg/dL — ABNORMAL HIGH (ref 65–99)

## 2015-09-21 NOTE — Progress Notes (Signed)
STROKE TEAM PROGRESS NOTE   SUBJECTIVE (INTERVAL HISTORY) Husband at beside.  No major changes.   OBJECTIVE Temp:  [97.6 F (36.4 C)-98.9 F (37.2 C)] 98.6 F (37 C) (08/12 1333) Pulse Rate:  [67-76] 68 (08/12 1333) Cardiac Rhythm: Normal sinus rhythm (08/12 0701) Resp:  [18-20] 18 (08/12 1333) BP: (123-135)/(49-68) 123/50 (08/12 1333) SpO2:  [99 %-100 %] 99 % (08/12 1333)  CBC:   Recent Labs Lab 09/17/15 1231  WBC 10.2  NEUTROABS 7.3  HGB 13.9  HCT 42.0  MCV 91.3  PLT 123456    Basic Metabolic Panel:   Recent Labs Lab 09/17/15 1231  NA 137  K 3.9  CL 105  CO2 25  GLUCOSE 266*  BUN 6  CREATININE 0.89  CALCIUM 10.2    Lipid Panel:     Component Value Date/Time   CHOL 127 09/11/2013 1101   TRIG 45 09/11/2013 1101   HDL 59 09/11/2013 1101   CHOLHDL 2.2 09/11/2013 1101   VLDL 9 09/11/2013 1101   LDLCALC 59 09/11/2013 1101   HgbA1c:  Lab Results  Component Value Date   HGBA1C 7.9 (H) 09/19/2015   Urine Drug Screen:     Component Value Date/Time   LABOPIA NONE DETECTED 08/15/2015 1327   COCAINSCRNUR NONE DETECTED 08/15/2015 1327   LABBENZ POSITIVE (A) 08/15/2015 1327   AMPHETMU NONE DETECTED 08/15/2015 1327   THCU NONE DETECTED 08/15/2015 1327   LABBARB NONE DETECTED 08/15/2015 1327      IMAGING  No results found. EEG  Posterior background slowing with no epileptiform discharges.  PHYSICAL EXAM Pleasant middle aged african american lady not in distress. . Afebrile. Head is nontraumatic. Neck is supple without bruit.    Cardiac exam no murmur or gallop. Lungs are clear to auscultation. Distal pulses are well felt. Neurological Exam :  Awake, but very significant psychomotor retardation or akinetic and very small verbal output. oriented 3. Normal speech and language except slight nonfluent speech. No aphasia or dysarthria. Extraocular movements are full range without nystagmus. Blinks to threat bilaterally. Right eye blindness from this optic  nerve involvement. Slight restriction of peripheral vision in the left eye. Fundi were not visualized. Right lower facial weakness which is chronic. Tongue midline. Motor system exam no upper extremity drift. Slight diminished fine finger movements on the right compared to the left. Orbits left over right upper extremity. Right lower extremity weakness graded 2/5 strength. No left-sided weakness. Sensation is intact bilaterally. Plantars are both downgoing. Gait was not tested.   ASSESSMENT/PLAN Ms. DALINDA OGOREK is a 65 y.o. female with history of Rosai-Dorfman disease, resection of an occipital mass lesion, hypertension, paroxysmal atrial fibrillation, hyperlipidemia, schizophrenia and diabetes mellitus presenting with R leg weakness x 2 days. MRI showed L frontoparietal medial SAH.    recurrent R leg weakness with enhancing L frontal parasaggital lesion felt to be Small localized subacute [facility]   Neurosurgery consult Dr. Leonie Man discussed at length with Dr. Christella Noa   MRI  Lesions left frontoparietal medial likely traumatic SAH  Doubt aneurysm or vascular abnormality. Dr. Christella Noa to discuss with neuroradiologist -> all agree subacute localized SAH. Rosai-Drofman lesions are stable.  CT head shows SAH  Repeat CT head this am when sleep unchanged  Carotid Doppler  Cancel  EEG generalized slowing, no seizure  SCDs for VTE prophylaxis Diet Carb Modified Fluid consistency: Thin; Room service appropriate? Yes  aspirin 81 mg daily prior to admission, now on No antithrombotic. Hold aspirin given SAH  Resume NIHSS  Therapy recommendations:  Inpatient rehabilitation recommended  Disposition:  pending   Paroxysmal Atrial Fibrillation  Home anticoagulation:  none now none due to h/o SAH and poor long term   Hyperlipidemia  Home meds:  crestor 10  Resumed statin in hospital   Continue statin at discharge  Diabetes type I  HgbA1c ordered, goal < 7.0  adding Novolog sensitive  correction of 0-9 units tid with meals   CBG ac & hs.  Other Stroke Risk Factors  Overweight, Body mass index is 28.14 kg/m., recommend weight loss, diet and exercise as appropriate   Other Active Problems  Blind   Schizophrenia  Hx occipital brain tumor excison  CT head showed lytic skull lesions raising concern for myeloma versus metabolic bone disease   Left parasaggital SAH with right leg weakness and some features of akinetic mutism due to location of lesion.    BP is well controlled.  Continue supportive care.  Rogue Jury, MD  09/21/2015 4:42 PM     To contact Stroke Continuity provider, please refer to [email or web address] After hours, contact General NeurologY

## 2015-09-21 NOTE — Progress Notes (Signed)
Patient not ambulated due to drowsiness.

## 2015-09-22 LAB — GLUCOSE, CAPILLARY
GLUCOSE-CAPILLARY: 256 mg/dL — AB (ref 65–99)
GLUCOSE-CAPILLARY: 192 mg/dL — AB (ref 65–99)
Glucose-Capillary: 293 mg/dL — ABNORMAL HIGH (ref 65–99)
Glucose-Capillary: 250 mg/dL — ABNORMAL HIGH (ref 65–99)

## 2015-09-22 MED ORDER — METFORMIN HCL 500 MG PO TABS
500.0000 mg | ORAL_TABLET | Freq: Two times a day (BID) | ORAL | Status: DC
  Administered 2015-09-22 – 2015-09-26 (×8): 500 mg via ORAL
  Filled 2015-09-22 (×8): qty 1

## 2015-09-22 NOTE — Progress Notes (Signed)
STROKE TEAM PROGRESS NOTE   HPI:  Rachel [name] is an 65 y.o. female with a history of Rosai-Dorfman disease, resection of an occipital mass lesion, hypertension, paroxysmal atrial fibrillation, hyperlipidemia, schizophrenia and diabetes mellitus, presenting with new onset weakness involving right leg for 2 days. She has no previous history of stroke no TIA. She's been taking aspirin 81 mg per day. MRI of the brain showed findings indicative of acute to subacute left frontoparietal subarachnoid hemorrhage, and addition to enhancing mass lesions in the left middle cranial fossa and left lateral temporal region. Plaque-like enhancement was noted involving anterior middle cranial fossa on the right, as well as an enhancing lesion involving right optic nerve. Postsurgical changes in the occipital region noted. Patient has had no change in speech and no symptoms involving the right upper extremity. She has chronic right facial weakness which is unchanged.  LSN: 09/14/2015 tPA Given: No: Acute SAH mRankin:    SUBJECTIVE (INTERVAL HISTORY) Husband at beside.  No major changes.   OBJECTIVE Temp:  [97.6 F (36.4 C)-99.1 F (37.3 C)] 97.6 F (36.4 C) (08/13 0525) Pulse Rate:  [67-79] 72 (08/13 0525) Cardiac Rhythm: Normal sinus rhythm (08/12 1900) Resp:  [16-18] 16 (08/13 0525) BP: (121-136)/(50-79) 121/56 (08/13 0525) SpO2:  [96 %-100 %] 100 % (08/13 0525)  CBC:   Recent Labs Lab 09/17/15 1231  WBC 10.2  NEUTROABS 7.3  HGB 13.9  HCT 42.0  MCV 91.3  PLT 030    Basic Metabolic Panel:   Recent Labs Lab 09/17/15 1231  NA 137  K 3.9  CL 105  CO2 25  GLUCOSE 266*  BUN 6  CREATININE 0.89  CALCIUM 10.2    Lipid Panel:     Component Value Date/Time   CHOL 127 09/11/2013 1101   TRIG 45 09/11/2013 1101   HDL 59 09/11/2013 1101   CHOLHDL 2.2 09/11/2013 1101   VLDL 9 09/11/2013 1101   LDLCALC 59 09/11/2013 1101   HgbA1c:  Lab Results  Component Value Date   HGBA1C  7.9 (H) 09/19/2015   Urine Drug Screen:     Component Value Date/Time   LABOPIA NONE DETECTED 08/15/2015 1327   COCAINSCRNUR NONE DETECTED 08/15/2015 1327   LABBENZ POSITIVE (A) 08/15/2015 1327   AMPHETMU NONE DETECTED 08/15/2015 1327   THCU NONE DETECTED 08/15/2015 1327   LABBARB NONE DETECTED 08/15/2015 1327      IMAGING  MRI BRAIN With and Without Contrast 09/17/2015 Progressive extra-axial/ dural enhancement consistent with patient's history of Rosai-Dorfman disease. These areas are consistent with lymphoproliferative changes. Findings include: Diffuse linear dural enhancement. Anterior left middle cranial fossa 1.5 x 2.5 x 2.6 cm extra-axial enhancing mass previously measuring up to 2.2 cm. Lateral left temporal region 1 x 1.2 x 1 cm enhancing lesion previously not visualized. Plaque-like enhancement anterior right middle cranial fossa Involving the right cavernous sinus. Enhancement along the optic canal greater on the right with extension along the posterior aspect of the right optic nerve. Abnormality involving the medial aspect of the posterior left frontal -parietal lobe is T1 hyperintense with gradient abnormality suggesting this may represent blood or melanin. Atypical extension of lymphoproliferative changes also a consideration.  As there is no loss of volume, hemorrhagic infarct is a less likely consideration. No acute thrombotic infarct seen separate from this region. Remote occipital surgery with hemorrhagic breakdown products in the operative site. Global atrophy without hydrocephalus. Partially empty sella and slightly prominent peri optic spaces.   CT Head Without Contrast 09/18/2015  1. MRI abnormality yesterday in the posterior left interhemispheric fissure does correspond to abnormal intermediate to high density material on CT, new since 07/26/2015. This is most suspicious for a small volume of subarachnoid hemorrhage, with progression of the underlying  chronic lymphoproliferative abnormality felt less likely. 2. Otherwise stable noncontrast CT appearance the brain since 07/26/2015.   See also Brain MRI report from yesterday. Study discussed by telephone with Dr. Leonie Man On 09/18/2015 at 11:27 .  CT Head Without Contrast 09/19/2015 Improving subarachnoid hemorrhage left convexity. No new area of hemorrhage Diffusely abnormal calvarium of uncertain etiology. Recommend evaluation for multiple myeloma.     EEG  Posterior background slowing with no epileptiform discharges.     PHYSICAL EXAM Pleasant middle aged african american lady not in distress. . Afebrile. Head is nontraumatic. Neck is supple without bruit.    Cardiac exam no murmur or gallop. Lungs are clear to auscultation. Distal pulses are well felt. Neurological Exam :  Awake, but very significant psychomotor retardation or akinetic and very small verbal output. oriented 3. Normal speech and language except slight nonfluent speech. No aphasia or dysarthria. Extraocular movements are full range without nystagmus. Blinks to threat bilaterally. 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 Slight restriction of peripheral vision in the left eye. Fundi were not visualized. Right lower facial weakness which is chronic. Tongue midline. Motor system exam no upper extremity drift. Slight diminished fine finger movements on the right compared to the left. Orbits left over right upper extremity. Right lower extremity weakness graded 2/5 strength. No left-sided weakness. Sensation is intact bilaterally. Plantars are both downgoing. Gait was not tested.   ASSESSMENT/PLAN Rachel [name] is a 65 y.o. female with history of Rosai-Dorfman disease, resection of an occipital mass lesion, hypertension, paroxysmal atrial fibrillation, hyperlipidemia, schizophrenia and diabetes mellitus presenting with R leg weakness x 2 days. MRI showed L frontoparietal medial SAH.    recurrent R leg  weakness with enhancing L frontal parasaggital lesion felt to be Small localized subacute [facility]   Neurosurgery consult Dr. Leonie Man discussed at length with Dr. Christella Noa   MRI  Lesions left frontoparietal medial likely traumatic SAH  Doubt aneurysm or vascular abnormality. Dr. Christella Noa to discuss with neuroradiologist -> all agree subacute localized SAH. Rosai-Drofman lesions are stable.  CT head shows SAH  Repeat CT head this am when sleep unchanged  Carotid Doppler  Cancel  EEG generalized slowing, no seizure  SCDs for VTE prophylaxis Diet Carb Modified Fluid consistency: Thin; Room service appropriate? Yes  aspirin 81 mg daily prior to admission, now on No antithrombotic. Hold aspirin given SAH  Resume NIHSS  Therapy recommendations:  Inpatient rehabilitation recommended  Disposition:  pending   Paroxysmal Atrial Fibrillation  Home anticoagulation:  none now none due to h/o SAH and poor long term   Hyperlipidemia  Home meds:  crestor 10  Resumed statin in hospital   Continue statin at discharge  Diabetes type I  HgbA1c ordered, goal < 7.0  adding Novolog sensitive correction of 0-9 units tid with meals   CBG ac & hs.  Other Stroke Risk Factors  Overweight, Body mass index is 28.14 kg/m., recommend weight loss, diet and exercise as appropriate   Other Active Problems  Blind   Schizophrenia  Hx occipital brain tumor excison  CT head showed lytic skull lesions raising concern for myeloma versus metabolic bone disease   Left parasaggital SAH with right leg weakness and some features of akinetic mutism due to location  of lesion.    BP is well controlled.  Blood glucose is running in the 200-295 range despite insulin sliding scale.  I will add Metformin 500 mg bid.  Her renal function is normal.   Continue supportive care.  Rogue Jury, MD  09/22/2015 8:04 AM     To contact Stroke Continuity provider, please refer to [email or web address] After hours, contact  General NeurologY

## 2015-09-22 NOTE — Progress Notes (Signed)
Pt's husband gave her chewing gum now pt will not spit it out, I attempted to educate husband on dangers of giving her chewing gum, I do not fell that he fully understood the gravity of this.  Was able to retrieve chewing gum from patients mouth but she would not take her PO 2200 medications.

## 2015-09-23 ENCOUNTER — Ambulatory Visit (HOSPITAL_COMMUNITY): Payer: Self-pay | Admitting: Psychiatry

## 2015-09-23 DIAGNOSIS — E785 Hyperlipidemia, unspecified: Secondary | ICD-10-CM

## 2015-09-23 LAB — PROTEIN ELECTROPHORESIS, SERUM
A/G Ratio: 1.2 (ref 0.7–1.7)
ALPHA-1-GLOBULIN: 0.2 g/dL (ref 0.0–0.4)
ALPHA-2-GLOBULIN: 0.7 g/dL (ref 0.4–1.0)
Albumin ELP: 3.4 g/dL (ref 2.9–4.4)
BETA GLOBULIN: 0.9 g/dL (ref 0.7–1.3)
GAMMA GLOBULIN: 1.1 g/dL (ref 0.4–1.8)
Globulin, Total: 2.9 g/dL (ref 2.2–3.9)
Total Protein ELP: 6.3 g/dL (ref 6.0–8.5)

## 2015-09-23 LAB — GLUCOSE, CAPILLARY
Glucose-Capillary: 173 mg/dL — ABNORMAL HIGH (ref 65–99)
Glucose-Capillary: 205 mg/dL — ABNORMAL HIGH (ref 65–99)
Glucose-Capillary: 112 mg/dL — ABNORMAL HIGH (ref 65–99)

## 2015-09-23 NOTE — Progress Notes (Addendum)
Patient is more alert and awake at this time with family present. Rn able to administer AM medications along with insulin. Per spouse at bedside, pt normally sleeps until later in the AM.   Ave Filter, RN

## 2015-09-23 NOTE — Progress Notes (Signed)
STROKE TEAM PROGRESS NOTE   SUBJECTIVE (INTERVAL HISTORY) Her husband is at the bedside. Patient still has right lower extremity weakness, however found to also had right upper extremity weakness. Husband stated that right upper extremity weakness comes and goes. PT recommended SNF now.    OBJECTIVE Temp:  [97.7 F (36.5 C)-98.7 F (37.1 C)] 97.7 F (36.5 C) (08/14 1000) Pulse Rate:  [65-79] 65 (08/14 1000) Cardiac Rhythm: Normal sinus rhythm (08/14 0700) Resp:  [16-18] 16 (08/14 1000) BP: (114-133)/(53-73) 126/64 (08/14 1000) SpO2:  [97 %-100 %] 98 % (08/14 1000)  CBC:   Recent Labs Lab 09/17/15 1231  WBC 10.2  NEUTROABS 7.3  HGB 13.9  HCT 42.0  MCV 91.3  PLT 476    Basic Metabolic Panel:   Recent Labs Lab 09/17/15 1231  NA 137  K 3.9  CL 105  CO2 25  GLUCOSE 266*  BUN 6  CREATININE 0.89  CALCIUM 10.2    Lipid Panel:     Component Value Date/Time   CHOL 127 09/11/2013 1101   TRIG 45 09/11/2013 1101   HDL 59 09/11/2013 1101   CHOLHDL 2.2 09/11/2013 1101   VLDL 9 09/11/2013 1101   LDLCALC 59 09/11/2013 1101   HgbA1c:  Lab Results  Component Value Date   HGBA1C 7.9 (H) 09/19/2015   Urine Drug Screen:     Component Value Date/Time   LABOPIA NONE DETECTED 08/15/2015 1327   COCAINSCRNUR NONE DETECTED 08/15/2015 1327   LABBENZ POSITIVE (A) 08/15/2015 1327   AMPHETMU NONE DETECTED 08/15/2015 1327   THCU NONE DETECTED 08/15/2015 1327   LABBARB NONE DETECTED 08/15/2015 1327      IMAGING  MRI BRAIN With and Without Contrast 09/17/2015 Progressive extra-axial/ dural enhancement consistent with patient's history of Rachel [name] disease. These areas are consistent with lymphoproliferative changes. Findings include: Diffuse linear dural enhancement. Anterior left middle cranial fossa 1.5 x 2.5 x 2.6 cm extra-axial enhancing mass previously measuring up to 2.2 cm. Lateral left temporal region 1 x 1.2 x 1 cm enhancing lesion previously not  visualized. Plaque-like enhancement anterior right middle cranial fossa Involving the right cavernous sinus. Enhancement along the optic canal greater on the right with extension along the posterior aspect of the right optic nerve. Abnormality involving the medial aspect of the posterior left frontal -parietal lobe is T1 hyperintense with gradient abnormality suggesting this may represent blood or melanin. Atypical extension of lymphoproliferative changes also a consideration.  As there is no loss of volume, hemorrhagic infarct is a less likely consideration. No acute thrombotic infarct seen separate from this region. Remote occipital surgery with hemorrhagic breakdown products in the operative site. Global atrophy without hydrocephalus. Partially empty sella and slightly prominent peri optic spaces.   CT Head Without Contrast 09/18/2015 1. MRI abnormality yesterday in the posterior left interhemispheric fissure does correspond to abnormal intermediate to high density material on CT, new since 07/26/2015. This is most suspicious for a small volume of subarachnoid hemorrhage, with progression of the underlying chronic lymphoproliferative abnormality felt less likely. 2. Otherwise stable noncontrast CT appearance the brain since 07/26/2015.  See also Brain MRI report from yesterday. Study discussed by telephone with Dr. Leonie [name] On 09/18/2015 at 11:27 .  CT Head Without Contrast 09/19/2015 Improving subarachnoid hemorrhage left convexity. No new area of hemorrhage Diffusely abnormal calvarium of uncertain etiology. Recommend evaluation for multiple myeloma.  EEG  Posterior background slowing with no epileptiform discharges.   PHYSICAL EXAM  Temp:  [97.7 F (36.5 C)-98.7 F (37.1  C)] 97.7 F (36.5 C) (08/14 1825) Pulse Rate:  [65-90] 68 (08/14 1825) Resp:  [16-18] 18 (08/14 1825) BP: (114-140)/(53-70) 140/70 (08/14 1825) SpO2:  [97 %-99 %] 98 % (08/14 1825)  General - Well  nourished, well developed, mild abulia, significant psychomotor slowing.  Ophthalmologic - Fundi not visualized due to noncooperation.  Cardiovascular - Regular rate and rhythm.  Neuro - awake, alert, orientated to self and husband, not orientated to time, place or situation. Able to follow simple commands, however significant psychomotor slowing. Paucity of speech, mild abulia. Able to name and repeat, however, dysarthric. Extraocular movements are full range without nystagmus. Blinks to threat bilaterally. Right eye blindness to only LP from this optic nerve involvement. Slight restriction of peripheral vision in the left eye. Fundi were not visualized. Right lower facial weakness which is chronic. Tongue midline. Motor system exam showed right hemiplegia, LUE and LLE spontaneous movement and against gravity at least. Sensation symmetrical bilaterally, DTR 1+, muscle tone decreased, no Babinski. Coordination and gait not tested.   ASSESSMENT/PLAN Rachel [name] is a 65 y.o. female with history of Rachel [name] disease, resection of an occipital mass lesion, hypertension, paroxysmal atrial fibrillation, hyperlipidemia, schizophrenia and diabetes mellitus presenting with R leg weakness x 2 days. MRI showed L frontoparietal medial SAH.    Recurrent R leg weakness with enhancing L frontal parasaggital lesion felt to be Small localized subacute [facility]   Neurosurgery consult Dr. Leonie [name] discussed at length with Dr. Christella [name], who did not feel lesion was related to Rachel [name]   MRI  Lesions left frontoparietal medial likely traumatic SAH  Doubt aneurysm or vascular abnormality. Dr. Christella [name] to discuss with neuroradiologist -> all agree subacute localized SAH. Rosai-Drofman lesions are stable.  CT head shows SAH  Repeat CT head unchanged x 2. Some diffuse abnormal calvarium of unknown etiology  EEG generalized slowing, no seizure  SCDs for VTE prophylaxis Diet Carb Modified Fluid consistency:  Thin; Room service appropriate? Yes  aspirin 81 mg daily prior to admission, now on No antithrombotic. Hold aspirin given SAH.  Therapy recommendations: SNF  Disposition:  pending (Husband provides care at home)  RUE plegia - ? Intermittent   Found to have RUE plegia this pm  Husband claimed it was intermittent, comes and goes for the last several days too  May consider EEG repeat  May consider MR repeat  Continue PT/OT  Paroxysmal Atrial Fibrillation  Home anticoagulation:  none   none now due to h/o SAH and poor long term prognosis  Hyperlipidemia  Home meds:  crestor 10 mg daily  Resumed statin in hospital   Continue statin at discharge  Diabetes type I, uncontrolled  HgbA1c 7.9, goal < 7.0  added Novolog sensitive SSI with meals  Continued to run high, so metformin added  CBG ac & hs.  Other Stroke Risk Factors  Overweight, Body mass index is 28.14 kg/m., recommend weight loss, diet and exercise as appropriate   Other Active Problems  Blind right eye  Schizophrenia  Hx occipital brain tumor excison  CT head showed lytic skull lesions raising concern for myeloma versus metabolic bone disease  Rosalin Hawking, MD PhD Stroke Neurology 09/23/2015 8:31 PM    To contact Stroke Continuity provider, please refer to [email or web address] After hours, contact General NeurologY

## 2015-09-23 NOTE — Progress Notes (Signed)
Confirmed with neuro team okay to hold NPH due to pt status.

## 2015-09-23 NOTE — Progress Notes (Signed)
Inpatient Rehabilitation  I met with the patient and her husband at the bedside to further discuss pt's need for rehab following her acute stay.  Pt's husband confirms that he is available and able to provide 24 hour care for the patient.  I am unsure at this time if pt. will be able to tolerate/participate in an intensive CIR program.  I await updated OT/PT notes (OT and PT are about to see pt.) for her ability to participate.  Will follow up tomorrow.  I discussed the case with Burnetta Sabin, stroke NP.  I recommend a back up plan in the event that pt is unable to come to IP Rehab for any reason.  Please call if questions.  [place] Admissions Coordinator Cell [phone] Office [phone]

## 2015-09-23 NOTE — Progress Notes (Signed)
Occupational Therapy Treatment Patient Details Name: Rachel [name] MRN: [id number] DOB: 09/01/1950 Today's Date: 09/23/2015    History of present illness 65 y.o. female with a history of Rosai-Dorfman disease, resection of an occipital mass lesion, hypertension, paroxysmal atrial fibrillation, hyperlipidemia, schizophrenia and diabetes mellitus, presenting with new onset weakness involving right leg for 2 days. MRI of the brain showed findings indicative of acute to subacute left frontoparietal subarachnoid hemorrhage, and addition to enhancing mass lesions in the left middle cranial fossa and left lateral temporal region.    OT comments  Pt demonstrates R side weakness and delayed response to questions. Pt demonstrates level appropriate at for SNF level care. Pt requires total +2 to transfer from supine <>Sit and then to chair this session.    Follow Up Recommendations  SNF    Equipment Recommendations  3 in 1 bedside comode;Wheelchair (measurements OT);Wheelchair cushion (measurements OT)    Recommendations for Other Services      Precautions / Restrictions Precautions Precautions: Fall       Mobility Bed Mobility Overal bed mobility: Needs Assistance Bed Mobility: Rolling;Supine to Sit Rolling: +2 for physical assistance;Max assist   Supine to sit: +2 for physical assistance;Max assist     General bed mobility comments: Pt log roll L with max cueing and unable to rotate neck or trunk. pt once on side attempting to push up with L UE with max cues. pt sitting EOB with (A)   Transfers Overall transfer level: Needs assistance   Transfers: Sit to/from Stand Sit to Stand: +2 physical assistance;Max assist   Squat pivot transfers: +2 physical assistance;Max assist     General transfer comment: Pt requires R LE blocked and (A) to weight shift toward chairt    Balance Overall balance assessment: Needs assistance Sitting-balance support: Single extremity  supported;Feet supported Sitting balance-Leahy Scale: Poor       Standing balance-Leahy Scale: Zero Standing balance comment: Requires R LE blocked                   ADL Overall ADL's : Needs assistance/impaired Eating/Feeding: Total assistance;Bed level Eating/Feeding Details (indicate cue type and reason): family feeding pt bed level on first attempt at session                     Toilet Transfer: +2 for physical assistance;Maximal assistance Toilet Transfer Details (indicate cue type and reason): Pt with R LE blocked adn requires total (A) to move with pivot. Pt simulated EOB to chair positioning           General ADL Comments: Pt sitting EOB > 10 minutes during session to work on sitting balance. pt verbalizes being able to see more than shadows. pt able to determine therapist hand length and verbalized blue to family shirt color and black instead of purple for therapist. Pt needed repetitive reminds for sitting balance       Vision                     Perception     Praxis      Cognition   Behavior During Therapy: Flat affect Overall Cognitive Status: Within Functional Limits for tasks assessed Area of Impairment: Orientation;Attention;Following commands;Safety/judgement;Awareness Orientation Level: Disoriented to;Situation Current Attention Level: Focused Memory: Decreased short-term memory  Following Commands: Follows one step commands with increased time Safety/Judgement: Decreased awareness of safety;Decreased awareness of deficits Awareness: Intellectual Problem Solving: Slow processing;Decreased initiation;Difficulty sequencing;Requires verbal cues;Requires tactile cues General Comments:  Pt reports "falls" but unable to verbalize reason for admission. pt > 20 seconds to respond to questions    Extremity/Trunk Assessment               Exercises     Shoulder Instructions       General Comments      Pertinent Vitals/  Pain       Pain Assessment: Faces Faces Pain Scale: Hurts little more Pain Location: R shoulder Pain Descriptors / Indicators: Discomfort Pain Intervention(s): Monitored during session;Repositioned;Limited activity within patient's tolerance  Home Living                                          Prior Functioning/Environment              Frequency Min 2X/week     Progress Toward Goals  OT Goals(current goals can now be found in the care plan section)  Progress towards OT goals: Progressing toward goals  Acute Rehab OT Goals Patient Stated Goal: return to functional independence OT Goal Formulation: With patient/family Time For Goal Achievement: 10/03/15 Potential to Achieve Goals: Good ADL Goals Pt Will Perform Grooming: with supervision;sitting Pt Will Perform Upper Body Bathing: with supervision;sitting Pt Will Perform Lower Body Bathing: with min assist;sit to/from stand Pt Will Transfer to Toilet: with mod assist;ambulating;bedside commode  Plan Discharge plan needs to be updated    Co-evaluation    PT/OT/SLP Co-Evaluation/Treatment: Yes Reason for Co-Treatment: Complexity of the patient's impairments (multi-system involvement);For patient/therapist safety   OT goals addressed during session: ADL's and self-care;Strengthening/ROM      End of Session Equipment Utilized During Treatment: Gait belt   Activity Tolerance Patient tolerated treatment well   Patient Left in chair;with call bell/phone within reach;with chair alarm set;with family/visitor present   Nurse Communication Mobility status;Precautions        Time: [id number] OT Time Calculation (min): 24 min  Charges: OT General Charges $OT Visit: 1 Procedure OT Treatments $Therapeutic Activity: 8-22 mins  Parke Poisson B 09/23/2015, 1:52 PM  Jeri Modena   OTR/L Pager: [phone] Office: [phone] .

## 2015-09-23 NOTE — Progress Notes (Signed)
Physical Therapy Treatment Patient Details Name: Rachel [name] MRN: [id number] DOB: October 30, 1950 Today's Date: 09/23/2015    History of Present Illness 65 y.o. female with a history of Rosai-Dorfman disease, resection of an occipital mass lesion, hypertension, paroxysmal atrial fibrillation, hyperlipidemia, schizophrenia and diabetes mellitus, presenting with new onset weakness involving right leg for 2 days. MRI of the brain showed findings indicative of acute to subacute left frontoparietal subarachnoid hemorrhage, and addition to enhancing mass lesions in the left middle cranial fossa and left lateral temporal region.     PT Comments    Patient progressing slowly.  Still very delayed in response to commands or with any verbal communication.  Patient fatigued with activities at EOB today. Seems unable to activate R LE with weight bearing or facilitation.  Feel SNF level rehab more appropriate at d/c.  PT to follow.   Follow Up Recommendations  SNF     Equipment Recommendations  Other (comment) (TBA)    Recommendations for Other Services       Precautions / Restrictions Precautions Precautions: Fall    Mobility  Bed Mobility Overal bed mobility: Needs Assistance Bed Mobility: Rolling;Supine to Sit Rolling: +2 for physical assistance;Max assist   Supine to sit: +2 for physical assistance;Max assist     General bed mobility comments: Pt log roll L with max cueing and unable to rotate neck or trunk. pt once on side attempting to push up with L UE with max cues. pt sitting EOB with (A)   Transfers Overall transfer level: Needs assistance Equipment used: 2 person hand held assist Transfers: Sit to/from [facility] Sit to Stand: +2 physical assistance;Max assist   Squat pivot transfers: +2 physical assistance;Max assist     General transfer comment: Pt requires R LE blocked and (A) to weight shift toward chairt  Ambulation/Gait                  Stairs            Wheelchair Mobility    Modified Rankin (Stroke Patients Only) Modified Rankin (Stroke Patients Only) Pre-Morbid Rankin Score: No symptoms Modified Rankin: Severe disability     Balance Overall balance assessment: Needs assistance Sitting-balance support: Feet supported;Single extremity supported Sitting balance-Leahy Scale: Poor Sitting balance - Comments: sat EOB x 10 min working on upright sitting, and coming forward into midline.  Pt has tendency to list posteriorly and to the right.     Standing balance-Leahy Scale: Zero Standing balance comment: Limited due to R HP                    Cognition Arousal/Alertness: Awake/alert Behavior During Therapy: Flat affect Overall Cognitive Status: Impaired/Different from baseline Area of Impairment: Orientation;Attention;Following commands;Safety/judgement;Awareness Orientation Level: Disoriented to;Situation Current Attention Level: Focused Memory: Decreased short-term memory Following Commands: Follows one step commands with increased time Safety/Judgement: Decreased awareness of safety;Decreased awareness of deficits Awareness: Intellectual Problem Solving: Slow processing;Decreased initiation;Difficulty sequencing;Requires verbal cues;Requires tactile cues General Comments: Pt reports "falls" but unable to verbalize reason for admission. pt > 20 seconds to respond to questions    Exercises Other Exercises Other Exercises: multiple attempts to ellicit active movement on L LE without noted activation     General Comments        Pertinent Vitals/Pain Pain Assessment: Faces Faces Pain Scale: Hurts little more Pain Location: R shoulder Pain Descriptors / Indicators: Discomfort Pain Intervention(s): Monitored during session;Repositioned    Home Living  Prior Function            PT Goals (current goals can now be found in the care plan section) Acute  Rehab PT Goals Patient Stated Goal: return to functional independence Progress towards PT goals: Progressing toward goals (slowly)    Frequency  Min 3X/week    PT Plan Discharge plan needs to be updated;Frequency needs to be updated    Co-evaluation PT/OT/SLP Co-Evaluation/Treatment: Yes Reason for Co-Treatment: Complexity of the patient's impairments (multi-system involvement);For patient/therapist safety PT goals addressed during session: Mobility/safety with mobility OT goals addressed during session: ADL's and self-care;Strengthening/ROM     End of Session Equipment Utilized During Treatment: Gait belt Activity Tolerance: Patient limited by fatigue Patient left: in chair;with call bell/phone within reach;with chair alarm set;with family/visitor present     Time: 1302-1330 PT Time Calculation (min) (ACUTE ONLY): 28 min  Charges:  $Therapeutic Activity: 8-22 mins                    G Codes:      Reginia Naas 2015-09-28, 4:41 PM  Magda Kiel, [place] 2015-09-28

## 2015-09-23 NOTE — Progress Notes (Signed)
RN and NA provided ADLs, face washed and attempted to feed pt breakfast but pt appears somnolent, able to open eyes while staffs provide care but easily fall back asleep. Breakfast and meds held at this this time.  Ave Filter, RN

## 2015-09-24 ENCOUNTER — Inpatient Hospital Stay (HOSPITAL_COMMUNITY): Payer: BLUE CROSS/BLUE SHIELD

## 2015-09-24 DIAGNOSIS — G8191 Hemiplegia, unspecified affecting right dominant side: Secondary | ICD-10-CM

## 2015-09-24 LAB — GLUCOSE, CAPILLARY
GLUCOSE-CAPILLARY: 249 mg/dL — AB (ref 65–99)
GLUCOSE-CAPILLARY: 200 mg/dL — AB (ref 65–99)
GLUCOSE-CAPILLARY: 232 mg/dL — AB (ref 65–99)
Glucose-Capillary: 289 mg/dL — ABNORMAL HIGH (ref 65–99)
Glucose-Capillary: 262 mg/dL — ABNORMAL HIGH (ref 65–99)

## 2015-09-24 MED ORDER — GADOBENATE DIMEGLUMINE 529 MG/ML IV SOLN
15.0000 mL | Freq: Once | INTRAVENOUS | Status: AC | PRN
  Administered 2015-09-24: 15 mL via INTRAVENOUS

## 2015-09-24 MED ORDER — INSULIN NPH (HUMAN) (ISOPHANE) 100 UNIT/ML ~~LOC~~ SUSP
18.0000 [IU] | Freq: Two times a day (BID) | SUBCUTANEOUS | Status: DC
  Administered 2015-09-24 – 2015-09-26 (×4): 18 [IU] via SUBCUTANEOUS

## 2015-09-24 MED ORDER — INSULIN NPH (HUMAN) (ISOPHANE) 100 UNIT/ML ~~LOC~~ SUSP
15.0000 [IU] | Freq: Two times a day (BID) | SUBCUTANEOUS | Status: DC
  Filled 2015-09-24: qty 10

## 2015-09-24 NOTE — Clinical Social Work Placement (Signed)
   CLINICAL SOCIAL WORK PLACEMENT  NOTE  Date:  09/24/2015  Patient Details  Name: Rachel [name] MRN: [id number] Date of Birth: [birth year]  Clinical Social Work is seeking post-discharge placement for this patient at the [place] level of care (*CSW will initial, date and re-position this form in  chart as items are completed):  Yes   Patient/family provided with [facility] Work Department's list of facilities offering this level of care within the geographic area requested by the patient (or if unable, by the patient's family).  Yes   Patient/family informed of their freedom to choose among providers that offer the needed level of care, that participate in Medicare, Medicaid or managed care program needed by the patient, have an available bed and are willing to accept the patient.  Yes   Patient/family informed of [facility]'s ownership interest in [facility] and [facility], as well as of the fact that they are under no obligation to receive care at these facilities.  PASRR submitted to EDS on 09/21/15     PASRR number received on       Existing PASRR number confirmed on       FL2 transmitted to all facilities in geographic area requested by pt/family on 09/24/15     FL2 transmitted to all facilities within larger geographic area on       Patient informed that his/her managed care company has contracts with or will negotiate with certain facilities, including the following:            Patient/family informed of bed offers received.  Patient chooses bed at       Physician recommends and patient chooses bed at      Patient to be transferred to   on  .  Patient to be transferred to facility by       Patient family notified on   of transfer.  Name of family member notified:        PHYSICIAN       Additional Comment:    _______________________________________________ Darden Dates, LCSW 09/24/2015, 4:38 PM

## 2015-09-24 NOTE — Progress Notes (Signed)
STROKE TEAM PROGRESS NOTE   SUBJECTIVE (INTERVAL HISTORY) No family is at the bedside. Patient still has right upper extremity weakness. Will need to repeat MRI with and without contrast    OBJECTIVE Temp:  [97.6 F (36.4 C)-98.2 F (36.8 C)] 97.6 F (36.4 C) (08/15 1648) Pulse Rate:  [67-77] 71 (08/15 1648) Cardiac Rhythm: Normal sinus rhythm (08/15 0700) Resp:  [16-20] 16 (08/15 1648) BP: (121-138)/(36-74) 134/69 (08/15 1648) SpO2:  [97 %-99 %] 97 % (08/15 1648)  CBC:  No results for input(s): WBC, NEUTROABS, HGB, HCT, MCV, PLT in the last 168 hours.  Basic Metabolic Panel:  No results for input(s): NA, K, CL, CO2, GLUCOSE, BUN, CREATININE, CALCIUM, MG, PHOS in the last 168 hours.  Lipid Panel:     Component Value Date/Time   CHOL 127 09/11/2013 1101   TRIG 45 09/11/2013 1101   HDL 59 09/11/2013 1101   CHOLHDL 2.2 09/11/2013 1101   VLDL 9 09/11/2013 1101   LDLCALC 59 09/11/2013 1101   HgbA1c:  Lab Results  Component Value Date   HGBA1C 7.9 (H) 09/19/2015   Urine Drug Screen:     Component Value Date/Time   LABOPIA NONE DETECTED 08/15/2015 1327   COCAINSCRNUR NONE DETECTED 08/15/2015 1327   LABBENZ POSITIVE (A) 08/15/2015 1327   AMPHETMU NONE DETECTED 08/15/2015 1327   THCU NONE DETECTED 08/15/2015 1327   LABBARB NONE DETECTED 08/15/2015 1327      IMAGING  MRI BRAIN With and Without Contrast 09/17/2015 Progressive extra-axial/ dural enhancement consistent with patient's history of Rosai-Dorfman disease. These areas are consistent with lymphoproliferative changes. Findings include: Diffuse linear dural enhancement. Anterior left middle cranial fossa 1.5 x 2.5 x 2.6 cm extra-axial enhancing mass previously measuring up to 2.2 cm. Lateral left temporal region 1 x 1.2 x 1 cm enhancing lesion previously not visualized. Plaque-like enhancement anterior right middle cranial fossa Involving the right cavernous sinus. Enhancement along the optic canal greater on the  right with extension along the posterior aspect of the right optic nerve. Abnormality involving the medial aspect of the posterior left frontal -parietal lobe is T1 hyperintense with gradient abnormality suggesting this may represent blood or melanin. Atypical extension of lymphoproliferative changes also a consideration.  As there is no loss of volume, hemorrhagic infarct is a less likely consideration. No acute thrombotic infarct seen separate from this region. Remote occipital surgery with hemorrhagic breakdown products in the operative site. Global atrophy without hydrocephalus. Partially empty sella and slightly prominent peri optic spaces.   CT Head Without Contrast 09/18/2015 1. MRI abnormality yesterday in the posterior left interhemispheric fissure does correspond to abnormal intermediate to high density material on CT, new since 07/26/2015. This is most suspicious for a small volume of subarachnoid hemorrhage, with progression of the underlying chronic lymphoproliferative abnormality felt less likely. 2. Otherwise stable noncontrast CT appearance the brain since 07/26/2015.  See also Brain MRI report from yesterday. Study discussed by telephone with Dr. Leonie Man On 09/18/2015 at 11:27 .  CT Head Without Contrast 09/19/2015 Improving subarachnoid hemorrhage left convexity. No new area of hemorrhage Diffusely abnormal calvarium of uncertain etiology. Recommend evaluation for multiple myeloma.  EEG  Posterior background slowing with no epileptiform discharges.   PHYSICAL EXAM  Temp:  [97.6 F (36.4 C)-98.2 F (36.8 C)] 97.6 F (36.4 C) (08/15 1648) Pulse Rate:  [67-77] 71 (08/15 1648) Resp:  [16-20] 16 (08/15 1648) BP: (121-138)/(36-74) 134/69 (08/15 1648) SpO2:  [97 %-99 %] 97 % (08/15 1648)  General - Well  nourished, well developed, mild abulia, significant psychomotor slowing.  Ophthalmologic - Fundi not visualized due to noncooperation.  Cardiovascular - Regular rate  and rhythm.  Neuro - awake, alert, orientated to self and husband, not orientated to time, place or situation. Able to follow simple commands, however significant psychomotor slowing. Paucity of speech, mild abulia. Able to name and repeat, however, dysarthric. Extraocular movements are full range without nystagmus. Blinks to threat bilaterally. Right eye blindness to only LP from this optic nerve involvement. Slight restriction of peripheral vision in the left eye. Fundi were not visualized. Right lower facial weakness which is chronic. Tongue midline. Motor system exam showed right hemiplegia, LUE and LLE spontaneous movement and against gravity at least. Sensation symmetrical bilaterally, DTR 1+, muscle tone decreased, no Babinski. Coordination and gait not tested.   ASSESSMENT/PLAN Ms. SKYYLAR KOPF is a 65 y.o. female with history of Rosai-Dorfman disease, resection of an occipital mass lesion, hypertension, paroxysmal atrial fibrillation, hyperlipidemia, schizophrenia and diabetes mellitus presenting with R leg weakness x 2 days. MRI showed L frontoparietal medial SAH.    Recurrent R leg weakness with enhancing L frontal parasaggital lesion felt to be Small localized subacute [facility]   Neurosurgery consult Dr. Leonie Man discussed at length with Dr. Christella Noa, who did not feel lesion was related to Rosai-Dorfman   MRI  Lesions left frontoparietal medial likely traumatic SAH  Doubt aneurysm or vascular abnormality. Dr. Christella Noa to discuss with neuroradiologist -> all agree subacute localized SAH. Rosai-Drofman lesions are stable.  CT head shows SAH  Repeat CT head unchanged x 2. Some diffuse abnormal calvarium of unknown etiology  EEG generalized slowing, no seizure  SCDs for VTE prophylaxis Diet Carb Modified Fluid consistency: Thin; Room service appropriate? Yes  aspirin 81 mg daily prior to admission, now on No antithrombotic. Hold aspirin given SAH.  Therapy recommendations:  SNF  Disposition:  pending (Husband provides care at home)  RUE plegia - new onset   Found to have RUE plegia yesterday  Husband claimed it was intermittent, comes and goes for the last several days too  MR repeat with and without contrast  Continue PT/OT  Paroxysmal Atrial Fibrillation  Home anticoagulation:  none   none now due to h/o SAH and poor long term prognosis  Hyperlipidemia  Home meds:  crestor 10 mg daily  Resumed statin in hospital   Continue statin at discharge  Diabetes type I, uncontrolled  HgbA1c 7.9, goal < 7.0  added Novolog sensitive SSI with meals  Continued to run high, so metformin added  CBG ac & hs.  Other Stroke Risk Factors  Overweight, Body mass index is 28.14 kg/m., recommend weight loss, diet and exercise as appropriate   Other Active Problems  Blind right eye  Schizophrenia  Hx occipital brain tumor excison  CT head showed lytic skull lesions raising concern for myeloma versus metabolic bone disease  Rosalin Hawking, MD PhD Stroke Neurology 09/24/2015 8:27 PM    To contact Stroke Continuity provider, please refer to [email or web address] After hours, contact General NeurologY

## 2015-09-24 NOTE — NC FL2 (Signed)
[place] LEVEL OF CARE SCREENING TOOL     IDENTIFICATION  Patient Name: Rachel [name] Birthdate: 1950-06-21 Sex: female Admission Date (Current Location): 09/17/2015  [facility] and [place] Number:  Herbalist and Address:  [facility], [place] [address], [place], [place] [address]      Provider Number: [id number]  Attending Physician Name and Address:  Rosalin Hawking, MD  Relative Name and Phone Number:       Current Level of Care: Hospital Recommended Level of Care: [place] Prior Approval Number:    Date Approved/Denied:   PASRR Number:    Discharge Plan: SNF    Current Diagnoses: Patient Active Problem List   Diagnosis Date Noted  . 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 Blind   . Schizophrenia ([place])   . Diabetes mellitus type 2 in nonobese (HCC)   . Acute encephalopathy   . Brain mass   . Subarachnoid hemorrhage following injury, no loss of consciousness ([place]) 09/18/2015  . SAH (subarachnoid hemorrhage) ([place]) 09/17/2015  . Facial droop 12/04/2013  . 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 Blind in both eyes 05/31/2013  . HTN (hypertension) 10/11/2012  . PAF (paroxysmal atrial fibrillation) ([place]) 10/11/2012  . Dyslipidemia 10/11/2012  . H/O brain tumor 10/11/2012  . Insomnia due to mental disorder 12/29/2011  . Libido, decreased 12/29/2011  . Depression 04/14/2011    Orientation RESPIRATION BLADDER Height & Weight     Self  Normal Incontinent Weight: 179 lb 11.2 oz (81.5 kg) Height:  5\' 7"  (170.2 cm)  BEHAVIORAL SYMPTOMS/MOOD NEUROLOGICAL BOWEL NUTRITION STATUS      Incontinent Diet (Carb Modified, Thin Liquids)  AMBULATORY STATUS COMMUNICATION OF NEEDS Skin   Extensive Assist Verbally Normal                       Personal Care Assistance Level of Assistance  Bathing, Dressing, Feeding Bathing Assistance: Limited assistance Feeding assistance: Limited assistance Dressing Assistance: Limited  assistance     Functional Limitations Info  Sight, Hearing, Speech Sight Info: Adequate Hearing Info: Adequate Speech Info: Impaired    SPECIAL CARE FACTORS FREQUENCY  PT (By licensed PT), OT (By licensed OT), Speech therapy     PT Frequency: 5 OT Frequency: 5     Speech Therapy Frequency: 5      Contractures Contractures Info: Not present    Additional Factors Info  Code Status, Psychotropic, Insulin Sliding Scale, Allergies Code Status Info: Full Code Allergies Info: Sulfa Antibiotics, Ibuprofen, Shellfish Allergery Psychotropic Info: Medications Insulin Sliding Scale Info: 4x/day       Current Medications (09/24/2015):  This is the current hospital active medication list Current Facility-Administered Medications  Medication Dose Route Frequency Provider Last Rate Last Dose  . 0.9 %  sodium chloride infusion   Intravenous Continuous Wallie Char 75 mL/hr at 09/17/15 2302    . acetaminophen (TYLENOL) tablet 650 mg  650 mg Oral Q4H PRN Wallie Char   650 mg at 09/23/15 1452   Or  . acetaminophen (TYLENOL) suppository 650 mg  650 mg Rectal Q4H PRN Wallie Char      . ALPRAZolam Duanne Moron) tablet 0.25 mg  0.25 mg Oral TID Wallie Char   0.25 mg at 09/24/15 [id number]  . digoxin (LANOXIN) tablet 0.125 mg  0.125 mg Oral Daily Donzetta Starch, NP   0.125 mg at 09/24/15 [id number]  . diltiazem (CARDIZEM CD) 24 hr capsule 180 mg  180 mg Oral  Daily Donzetta Starch, NP   180 mg at 09/24/15 [id number]  . famotidine (PEPCID) tablet 20 mg  20 mg Oral Daily Donzetta Starch, NP   20 mg at 09/24/15 [id number]  . insulin aspart (novoLOG) injection 0-5 Units  0-5 Units Subcutaneous QHS Donzetta Starch, NP   2 Units at 09/22/15 2203  . insulin aspart (novoLOG) injection 0-9 Units  0-9 Units Subcutaneous TID WC Donzetta Starch, NP   2 Units at 09/24/15 [id number]  . insulin NPH Human (HUMULIN N,NOVOLIN N) injection 12-14 Units  12-14 Units Subcutaneous BID AC & HS Donzetta Starch, NP   14 Units at 09/23/15 1203  . metFORMIN  (GLUCOPHAGE) tablet 500 mg  500 mg Oral BID WC Rogue Jury, MD   500 mg at 09/24/15 [id number]  . PARoxetine (PAXIL) tablet 40 mg  40 mg Oral QHS Charles Stewart   40 mg at 09/23/15 2026  . potassium chloride (K-DUR) CR tablet 10 mEq  10 mEq Oral Daily Donzetta Starch, NP   10 mEq at 09/24/15 [id number]  . risperiDONE (RISPERDAL) tablet 2 mg  2 mg Oral QHS Wallie Char   2 mg at 09/23/15 2027  . rosuvastatin (CRESTOR) tablet 10 mg  10 mg Oral QHS Donzetta Starch, NP   10 mg at 09/23/15 2026  . senna-docusate (Senokot-S) tablet 1 tablet  1 tablet Oral BID Wallie Char   1 tablet at 09/24/15 [id number]  . traZODone (DESYREL) tablet 75 mg  75 mg Oral QHS PRN Donzetta Starch, NP         Discharge Medications: Please see discharge summary for a list of discharge medications.  Relevant Imaging Results:  Relevant Lab Results:   Additional Information SSN:  [id number]  Darden Dates, LCSW

## 2015-09-24 NOTE — Care Management Note (Signed)
Case Management Note  Patient Details  Name: Rachel [name] MRN: [id number] Date of Birth: 07-16-1950  Subjective/Objective:                    Action/Plan: Plan is for SNF at discharge. CM following for d/c needs.   Expected Discharge Date:                  Expected Discharge Plan:  Skilled Nursing Facility  In-House Referral:  Clinical Social Work  Discharge planning Services  CM Consult  Post Acute Care Choice:    Choice offered to:     DME Arranged:    DME Agency:     HH Arranged:    [place] Agency:     Status of Service:  In process, will continue to follow  If discussed at Long Length of Stay Meetings, dates discussed:    Additional Comments:  Pollie Friar, RN 09/24/2015, 12:11 PM

## 2015-09-24 NOTE — Progress Notes (Signed)
I reviewed PT and OT notes from yesterday's treatment session and note their recommendation for post acute rehab has been changed to SNF.  Pt. is progressing slowly and currently does not demonstrate adequate tolerance for an intensive CIR environment. I have updated the patient and husband as well as Jacqualin Combes, [place].  Pt's husband was looking through a local  SNF handout upon my arrival.  I texted Judson Roch, [place] with this update.  I will sign off.  Please call if questions.  [place] Admissions Coordinator Cell [phone] Office [phone]

## 2015-09-24 NOTE — Progress Notes (Signed)
Speech Language Pathology Treatment: Cognitive-Linquistic  Patient Details Name: Rachel [name] MRN: [id number] DOB: 07/16/1950 Today's Date: 09/24/2015 Time: [id number] SLP Time Calculation (min) (ACUTE ONLY): 10 min  Assessment / Plan / Recommendation Clinical Impression  Skilled treatment session focused on addressing cognition goals.  SLP facilitated session with discussion regarding orientation information.  Patient required Mod verbal, visual cues for general orientation.  Patient also benefitted from extra time, repetitions, and choices to verbally initiate responses.  However, spouse reports that patient has been able to verbally initiate requests for assistance when needing to use the bedpan.  Patient also required frequent cues for redirection due to continued poor sustained attention due to what appeared to be internal distraction.  Will continue to follow acutely for cognitive-linguistic treatment.  Additionally, patient will require SLP services in the next level of care as well.     HPI HPI: Pt is a 65 y.o. female with PMH of Rosai-Dorfman disease, resection of an occipital mass lesion, hypertension, paroxysmal atrial fibrillation, hyperlipidemia, schizophrenia and diabetes mellitus, presenting with new onset weakness involving right leg for 2 days, MRI of the brain showed findings indicative of acute to subacute left frontoparietal subarachnoid hemorrhage, and addition to enhancing mass lesions in the left middle cranial fossa and left lateral temporal region.       SLP Plan  Continue with current plan of care     Recommendations                Oral Care Recommendations: Oral care BID Follow up Recommendations: Skilled Nursing facility;24 hour supervision/assistance Plan: Continue with current plan of care     GO               Carmelia Roller., CCC-SLP [id number]   [place] 09/24/2015, 3:46 PM

## 2015-09-24 NOTE — Clinical Social Work Note (Signed)
Clinical Social Work Assessment  Patient Details  Name: Rachel [name] MRN: [id number] Date of Birth: 1950/08/11  Date of referral:  09/24/15               Reason for consult:  Facility Placement                Permission sought to share information with:  Family Supports Permission granted to share information::  Yes, Verbal Permission Granted  Name::     Jules Vidovich  Relationship::  Husband  Contact Information:  [phone]  Housing/Transportation Living arrangements for the past 2 months:  [place] of Information:  Patient, Spouse Patient Interpreter Needed:  None Criminal Activity/Legal Involvement Pertinent to Current Situation/Hospitalization:  No - Comment as needed Significant Relationships:  Spouse Lives with:  Spouse Do you feel safe going back to the place where you live?  Yes Need for family participation in patient care:  Yes (Comment)  Care giving concerns:  No care giving concerns identified.   Social Worker assessment / plan:   CSW met with pt and husband to address consult for New SNF. CSW introduced herself and explained role of social work. CSW also explained the process of discharging to SNF with [employer]. PT is recommending rehab and pt and husband are agreeable. CSW initiated SNF search and will follow up with bed offers. PASARR is pending. CSW will continue to follow.   Employment status:  Disabled (Comment on whether or not currently receiving Disability) Insurance information:  Managed Care PT Recommendations:  [place] / Referral to community resources:  [place]  Patient/Family's Response to care:  Pt and husband were appreciative of CSW support.   Patient/Family's Understanding of and Emotional Response to Diagnosis, Current Treatment, and Prognosis:  Pt's husband understands that pt is in need of continued rehab at a SNF.   Emotional Assessment Appearance:  Appears stated  age Attitude/Demeanor/Rapport:  Other (Appropriate) Affect (typically observed):  Pleasant Orientation:  Oriented to Self, Oriented to Place, Fluctuating Orientation (Suspected and/or reported Sundowners) Alcohol / Substance use:  Never Used Psych involvement (Current and /or in the community):  No (Comment)  Discharge Needs  Concerns to be addressed:  Adjustment to Illness Readmission within the last 30 days:  No Current discharge risk:  Chronically ill Barriers to Discharge:  Continued Medical Work up   [employer], LCSW 09/24/2015, 4:36 PM

## 2015-09-25 LAB — BASIC METABOLIC PANEL
Anion gap: 7 (ref 5–15)
BUN: 13 mg/dL (ref 6–20)
CHLORIDE: 104 mmol/L (ref 101–111)
CO2: 26 mmol/L (ref 22–32)
Calcium: 10.3 mg/dL (ref 8.9–10.3)
Creatinine, Ser: 0.7 mg/dL (ref 0.44–1.00)
GFR calc Af Amer: >60 mL/min (ref >60)
GFR calc non Af Amer: >60 mL/min (ref >60)
GLUCOSE: 179 mg/dL — AB (ref 65–99)
POTASSIUM: 3.9 mmol/L (ref 3.5–5.1)
Sodium: 137 mmol/L (ref 135–145)

## 2015-09-25 LAB — GLUCOSE, CAPILLARY
GLUCOSE-CAPILLARY: 159 mg/dL — AB (ref 65–99)
GLUCOSE-CAPILLARY: 206 mg/dL — AB (ref 65–99)
Glucose-Capillary: 147 mg/dL — ABNORMAL HIGH (ref 65–99)
Glucose-Capillary: 185 mg/dL — ABNORMAL HIGH (ref 65–99)

## 2015-09-25 LAB — CBC
HEMATOCRIT: 38.9 % (ref 36.0–46.0)
Hemoglobin: 12.6 g/dL (ref 12.0–15.0)
MCH: 29.6 pg (ref 26.0–34.0)
MCHC: 32.4 g/dL (ref 30.0–36.0)
MCV: 91.3 fL (ref 78.0–100.0)
Platelets: 166 10*3/uL (ref 150–400)
RBC: 4.26 MIL/uL (ref 3.87–5.11)
RDW: 12.4 % (ref 11.5–15.5)
WBC: 10 10*3/uL (ref 4.0–10.5)

## 2015-09-25 NOTE — Clinical Social Work Note (Signed)
PASARR: [id number] Claudean Severance, [facility] Social Worker

## 2015-09-25 NOTE — Progress Notes (Signed)
Physical Therapy Treatment Patient Details Name: Rachel [name] MRN: [id number] DOB: 1950/11/12 Today's Date: 09/25/2015    History of Present Illness 65 y.o. female with a history of Rosai-Dorfman disease, resection of an occipital mass lesion, hypertension, paroxysmal atrial fibrillation, hyperlipidemia, schizophrenia and diabetes mellitus, presenting with new onset weakness involving right leg for 2 days. MRI of the brain showed findings indicative of acute to subacute left frontoparietal subarachnoid hemorrhage, and addition to enhancing mass lesions in the left middle cranial fossa and left lateral temporal region.     PT Comments    Pt making very slow gains.  Worked at [employer] trying to elicit truncal activation /reactions using heavy exaggerated pushing and pulling to get pt to resist.  Follow Up Recommendations  SNF     Equipment Recommendations  Other (comment)    Recommendations for Other Services       Precautions / Restrictions Precautions Precautions: Fall    Mobility  Bed Mobility Overal bed mobility: Needs Assistance Bed Mobility: Rolling;Sidelying to Sit Rolling: +2 for physical assistance;Max assist Sidelying to sit: Max assist       General bed mobility comments: Pt log roll L with max cueing and unable to rotate neck or trunk. pt once on side attempting to push up with L UE with max cues. pt sitting EOB with (A)   Transfers Overall transfer level: Needs assistance Equipment used: 2 person hand held assist Transfers: Sit to/from [facility] Sit to Stand: +2 physical assistance;Max assist Stand pivot transfers: Max assist;+2 physical assistance       General transfer comment: assist to come forward and boost.  Needs some support at R knee.  Assistedwith w/shift, but pt unable to advance L LE>  Ambulation/Gait             General Gait Details: not able   Stairs            Wheelchair Mobility    Modified Rankin  (Stroke Patients Only) Modified Rankin (Stroke Patients Only) Pre-Morbid Rankin Score: No symptoms Modified Rankin: Severe disability     Balance Overall balance assessment: Needs assistance   Sitting balance-Leahy Scale: Poor Sitting balance - Comments: sat EOB x 10 min working on upright sitting, activating trunk with modified rhythmic stabilization technique to force pt to respond.     Standing balance-Leahy Scale: Zero                      Cognition Arousal/Alertness: Awake/alert Behavior During Therapy: Flat affect Overall Cognitive Status: Impaired/Different from baseline Area of Impairment: Orientation;Attention;Following commands;Safety/judgement;Awareness Orientation Level: Disoriented to;Situation Current Attention Level: Focused Memory: Decreased short-term memory Following Commands: Follows one step commands with increased time Safety/Judgement: Decreased awareness of safety;Decreased awareness of deficits Awareness: Intellectual Problem Solving: Slow processing;Decreased initiation;Difficulty sequencing;Requires verbal cues;Requires tactile cues      Exercises      General Comments General comments (skin integrity, edema, etc.): pt needed exaggerated input/resistance to build a response.  L side greater than R.      Pertinent Vitals/Pain Pain Assessment: No/denies pain    Home Living                      Prior Function            PT Goals (current goals can now be found in the care plan section) Acute Rehab PT Goals PT Goal Formulation: With patient Time For Goal Achievement: 10/03/15 Potential to Achieve Goals: Good  Progress towards PT goals: Progressing toward goals    Frequency  Min 3X/week    PT Plan Current plan remains appropriate    Co-evaluation PT/OT/SLP Co-Evaluation/Treatment: Yes   PT goals addressed during session: Mobility/safety with mobility       End of Session   Activity Tolerance: Patient limited by  fatigue Patient left: in chair;with call bell/phone within reach;with chair alarm set;with family/visitor present     Time: 1400-1425 PT Time Calculation (min) (ACUTE ONLY): 25 min  Charges:  $Therapeutic Activity: 8-22 mins                    G Codes:      Armani Gawlik, Tessie Fass 09/25/2015, 4:14 PM 09/25/2015  Donnella Sham, PT [phone]  (pager)

## 2015-09-25 NOTE — Progress Notes (Signed)
Occupational Therapy Treatment Patient Details Name: Rachel [name] MRN: [id number] DOB: [birth year] Today's Date: 09/25/2015    History of present illness 65 y.o. female with a history of Rosai-Dorfman disease, resection of an occipital mass lesion, hypertension, paroxysmal atrial fibrillation, hyperlipidemia, schizophrenia and diabetes mellitus, presenting with new onset weakness involving right leg for 2 days. MRI of the brain showed findings indicative of acute to subacute left frontoparietal subarachnoid hemorrhage, and addition to enhancing mass lesions in the left middle cranial fossa and left lateral temporal region.    OT comments  Pt tolerated sitting EOB for ~10 minutes this session for participation in UB strengthening and trunk control activities. Pt noted to have some active movement in RUE and able to pull through RUE at end of session to assist with self righting. Pt remains a max assist +2 for stand pivot transfers. D/c plan remains appropriate. Will continue to follow acutely.   Follow Up Recommendations  SNF    Equipment Recommendations  3 in 1 bedside comode;Wheelchair (measurements OT);Wheelchair cushion (measurements OT)    Recommendations for Other Services      Precautions / Restrictions Precautions Precautions: Fall       Mobility Bed Mobility Overal bed mobility: Needs Assistance Bed Mobility: Rolling;Sidelying to Sit Rolling: +2 for physical assistance;Max assist Sidelying to sit: Max assist;+2 for physical assistance       General bed mobility comments: Pt log roll L with max cueing and unable to rotate neck or trunk. pt once on side attempting to push up with L UE with max cues. pt sitting EOB with (A)   Transfers Overall transfer level: Needs assistance Equipment used: 2 person hand held assist Transfers: Sit to/from [facility] Sit to Stand: +2 physical assistance;Max assist Stand pivot transfers: Max assist;+2 physical  assistance       General transfer comment: assist to come forward and boost.  Needs some support at R knee.  Assistedwith w/shift, but pt unable to advance L LE>    Balance Overall balance assessment: Needs assistance   Sitting balance-Leahy Scale: Poor Sitting balance - Comments: sat EOB x 10 min working on upright sitting, activating trunk with modified rhythmic stabilization technique to force pt to respond.     Standing balance-Leahy Scale: Zero                     ADL Overall ADL's : Needs assistance/impaired                         Toilet Transfer: Maximal assistance;+2 for physical assistance;Stand-pivot Toilet Transfer Details (indicate cue type and reason): simulated by transfer from EOB to chair           General ADL Comments: Focus of session on UB strengthening and trunk control activities. Pt tolerated sitting EOB ~10 minutes while participating in strengthening activities. Pt noted to have minimal active movement of RUE. Pt able to pull throughout LUE during balance challenge activites; at end of session pt demonstrated pulling throughout RUE.       Vision                     Perception     Praxis      Cognition   Behavior During Therapy: Flat affect Overall Cognitive Status: Impaired/Different from baseline Area of Impairment: Orientation;Attention;Following commands;Safety/judgement;Awareness Orientation Level: Disoriented to;Situation Current Attention Level: Focused Memory: Decreased short-term memory  Following Commands: Follows one step commands  with increased time Safety/Judgement: Decreased awareness of safety;Decreased awareness of deficits Awareness: Intellectual Problem Solving: Slow processing;Decreased initiation;Difficulty sequencing;Requires verbal cues;Requires tactile cues      Extremity/Trunk Assessment               Exercises     Shoulder Instructions       General Comments      Pertinent  Vitals/ Pain       Pain Assessment: No/denies pain  Home Living                                          Prior Functioning/Environment              Frequency Min 2X/week     Progress Toward Goals  OT Goals(current goals can now be found in the care plan section)  Progress towards OT goals: Progressing toward goals  Acute Rehab OT Goals Patient Stated Goal: none stated OT Goal Formulation: With patient  Plan Discharge plan remains appropriate    Co-evaluation    PT/OT/SLP Co-Evaluation/Treatment: Yes Reason for Co-Treatment: Complexity of the patient's impairments (multi-system involvement);For patient/therapist safety PT goals addressed during session: Mobility/safety with mobility OT goals addressed during session: Strengthening/ROM      End of Session Equipment Utilized During Treatment: Gait belt   Activity Tolerance Patient tolerated treatment well   Patient Left in chair;with call bell/phone within reach;with chair alarm set   Nurse Communication          Time: [id number] OT Time Calculation (min): 25 min  Charges: OT General Charges $OT Visit: 1 Procedure OT Treatments $Therapeutic Activity: 8-22 mins  Binnie Kand M.S., OTR/L Pager: [phone]  09/25/2015, 4:18 PM

## 2015-09-25 NOTE — Progress Notes (Signed)
STROKE TEAM PROGRESS NOTE   SUBJECTIVE (INTERVAL HISTORY) No change in patient condition. Awaiting SNF bed for discharge. MRI repeat no changes.    OBJECTIVE Temp:  [97.6 F (36.4 C)-98.8 F (37.1 C)] 97.9 F (36.6 C) (08/16 0945) Pulse Rate:  [63-77] 71 (08/16 0945) Cardiac Rhythm: Normal sinus rhythm;Bundle branch block (08/15 1900) Resp:  [16-19] 16 (08/16 0945) BP: (120-135)/(36-76) 135/67 (08/16 0945) SpO2:  [97 %-98 %] 98 % (08/16 0945)  CBC:   Recent Labs Lab 09/25/15 0429  WBC 10.0  HGB 12.6  HCT 38.9  MCV 91.3  PLT 720    Basic Metabolic Panel:   Recent Labs Lab 09/25/15 0429  NA 137  K 3.9  CL 104  CO2 26  GLUCOSE 179*  BUN 13  CREATININE 0.70  CALCIUM 10.3    Lipid Panel:     Component Value Date/Time   CHOL 127 09/11/2013 1101   TRIG 45 09/11/2013 1101   HDL 59 09/11/2013 1101   CHOLHDL 2.2 09/11/2013 1101   VLDL 9 09/11/2013 1101   LDLCALC 59 09/11/2013 1101   HgbA1c:  Lab Results  Component Value Date   HGBA1C 7.9 (H) 09/19/2015   Urine Drug Screen:     Component Value Date/Time   LABOPIA NONE DETECTED 08/15/2015 1327   COCAINSCRNUR NONE DETECTED 08/15/2015 1327   LABBENZ POSITIVE (A) 08/15/2015 1327   AMPHETMU NONE DETECTED 08/15/2015 1327   THCU NONE DETECTED 08/15/2015 1327   LABBARB NONE DETECTED 08/15/2015 1327      IMAGING  MRI BRAIN With and Without Contrast  09/24/2015 1. Stable dural thickening and foci of masslike enhancement overlying the bilateral cavernous sinuses/Meckel's caves, within the right optic canal, and overlying the left middle cranial fossa consistent patient's history of Rosai-Dorfman disease. 2. Increasing subarachnoid enhancement in the region of hemorrhage in the left paramedian frontal and parietal lobes may be reactive due to the presence of blood products, but infection/inflammation or proliferation of histiocytosis is possible and follow-up is recommended. 3. No evidence of acute infarct, new  intracranial hemorrhage, or new focal mass effect. 4. Stable partial empty sella turcica, and large Meckel's caves, and possible papilledema can be seen with increased intracranial pressure, such as pseudotumor cerebri.  09/17/2015 Progressive extra-axial/ dural enhancement consistent with patient's history of Rosai-Dorfman disease. These areas are consistent with lymphoproliferative changes. Findings include: Diffuse linear dural enhancement. Anterior left middle cranial fossa 1.5 x 2.5 x 2.6 cm extra-axial enhancing mass previously measuring up to 2.2 cm. Lateral left temporal region 1 x 1.2 x 1 cm enhancing lesion previously not visualized. Plaque-like enhancement anterior right middle cranial fossa Involving the right cavernous sinus. Enhancement along the optic canal greater on the right with extension along the posterior aspect of the right optic nerve. Abnormality involving the medial aspect of the posterior left frontal -parietal lobe is T1 hyperintense with gradient abnormality suggesting this may represent blood or melanin. Atypical extension of lymphoproliferative changes also a consideration.  As there is no loss of volume, hemorrhagic infarct is a less likely consideration. No acute thrombotic infarct seen separate from this region. Remote occipital surgery with hemorrhagic breakdown products in the operative site. Global atrophy without hydrocephalus. Partially empty sella and slightly prominent peri optic spaces.  CT Head Without Contrast 09/18/2015 1. MRI abnormality yesterday in the posterior left interhemispheric fissure does correspond to abnormal intermediate to high density material on CT, new since 07/26/2015. This is most suspicious for a small volume of subarachnoid hemorrhage, with progression  of the underlying chronic lymphoproliferative abnormality felt less likely. 2. Otherwise stable noncontrast CT appearance the brain since 07/26/2015.  See also Brain MRI report  from yesterday. Study discussed by telephone with Dr. Leonie Man On 09/18/2015 at 11:27 .  CT Head Without Contrast 09/19/2015 Improving subarachnoid hemorrhage left convexity. No new area of hemorrhage Diffusely abnormal calvarium of uncertain etiology. Recommend evaluation for multiple myeloma.  EEG  Posterior background slowing with no epileptiform discharges.   PHYSICAL EXAM General - Well nourished, well developed, mild abulia, significant psychomotor slowing.  Ophthalmologic - Fundi not visualized due to noncooperation.  Cardiovascular - Regular rate and rhythm.  Neuro - awake, alert, orientated to self and husband, not orientated to time, place or situation. Able to follow simple commands, however significant psychomotor slowing. Paucity of speech, mild abulia. Able to name and repeat, however, dysarthric. Extraocular movements are full range without nystagmus. Blinks to threat bilaterally. Right eye blindness to only LP from this optic nerve involvement. Slight restriction of peripheral vision in the left eye. Fundi were not visualized. Right lower facial weakness which is chronic. Tongue midline. Motor system exam showed right hemiplegia, LUE and LLE spontaneous movement and against gravity at least. Sensation symmetrical bilaterally, DTR 1+, muscle tone decreased, no Babinski. Coordination and gait not tested.   ASSESSMENT/PLAN Ms. Rachel [name] is a 65 y.o. female with history of Rosai-Dorfman disease, resection of an occipital mass lesion, hypertension, paroxysmal atrial fibrillation, hyperlipidemia, schizophrenia and diabetes mellitus presenting with R leg weakness x 2 days. MRI showed L frontoparietal medial SAH.    Recurrent R leg weakness with enhancing L frontal parasaggital lesion felt to be Small localized subacute [facility]   Neurosurgery consult Dr. Leonie Man discussed at length with Dr. Christella Noa, who did not feel lesion was related to Rosai-Dorfman   MRI  Lesions left  frontoparietal medial likely traumatic SAH  Doubt aneurysm or vascular abnormality. Dr. Christella Noa to discuss with neuroradiologist -> all agree subacute localized SAH. Rosai-Drofman lesions are stable.  CT head shows SAH  Repeat CT head unchanged x 2. Some diffuse abnormal calvarium of unknown etiology  Repeat MRI stable dural thickening/masslike enhancement. Increased SAH enhancement of L paramedial frontal and parietal lobes (reactive vs infection/inflammation). No new infarct.   EEG generalized slowing, no seizure  SCDs for VTE prophylaxis Diet Carb Modified Fluid consistency: Thin; Room service appropriate? Yes  aspirin 81 mg daily prior to admission, now on No antithrombotic. Hold aspirin given SAH.  Therapy recommendations: SNF  Disposition:  pending (Husband provides care at home) medically ready for discharge once bed found. Getting PASSAR #  Plan repeat MRI in 3 months  RUE plegia - new onset   Found to have RUE plegia   Husband claimed it was intermittent, comes and goes for the last several days too  MR repeat with and without contrast - no new infarct. Stable dural thickening. Increased SAH enhancement   Continue PT/OT  Paroxysmal Atrial Fibrillation  Home anticoagulation:  none   none now due to h/o SAH and poor long term prognosis  Hyperlipidemia  Home meds:  crestor 10 mg daily  Resumed statin in hospital   Continue statin at discharge  Diabetes type I, uncontrolled  HgbA1c 7.9, goal < 7.0  added Novolog sensitive SSI with meals  Continued to run high, so metformin added  CBG ac & hs.  Other Stroke Risk Factors  Overweight, Body mass index is 28.14 kg/m., recommend weight loss, diet and exercise as appropriate   Other  Active Problems  Blind right eye  Schizophrenia  Hx occipital brain tumor excison  CT head showed lytic skull lesions raising concern for myeloma versus metabolic bone disease  Hospital day # 8  Rosalin Hawking, MD  PhD Stroke Neurology 09/25/2015 9:42 PM    To contact Stroke Continuity provider, please refer to [email or web address] After hours, contact General NeurologY

## 2015-09-26 DIAGNOSIS — R69 Illness, unspecified: Secondary | ICD-10-CM

## 2015-09-26 DIAGNOSIS — R4189 Other symptoms and signs involving cognitive functions and awareness: Secondary | ICD-10-CM

## 2015-09-26 DIAGNOSIS — E1159 Type 2 diabetes mellitus with other circulatory complications: Secondary | ICD-10-CM

## 2015-09-26 DIAGNOSIS — Z794 Long term (current) use of insulin: Secondary | ICD-10-CM

## 2015-09-26 LAB — GLUCOSE, CAPILLARY
GLUCOSE-CAPILLARY: 99 mg/dL (ref 65–99)
Glucose-Capillary: 83 mg/dL (ref 65–99)
Glucose-Capillary: 127 mg/dL — ABNORMAL HIGH (ref 65–99)

## 2015-09-26 LAB — BASIC METABOLIC PANEL
Anion gap: 7 (ref 5–15)
BUN: 7 mg/dL (ref 6–20)
CALCIUM: 10.3 mg/dL (ref 8.9–10.3)
CO2: 28 mmol/L (ref 22–32)
CREATININE: 0.61 mg/dL (ref 0.44–1.00)
Chloride: 104 mmol/L (ref 101–111)
GFR calc Af Amer: >60 mL/min (ref >60)
Glucose, Bld: 101 mg/dL — ABNORMAL HIGH (ref 65–99)
Potassium: 4.2 mmol/L (ref 3.5–5.1)
SODIUM: 139 mmol/L (ref 135–145)

## 2015-09-26 LAB — CBC
HCT: 40.8 % (ref 36.0–46.0)
Hemoglobin: 13.5 g/dL (ref 12.0–15.0)
MCH: 30.2 pg (ref 26.0–34.0)
MCHC: 33.1 g/dL (ref 30.0–36.0)
MCV: 91.3 fL (ref 78.0–100.0)
PLATELETS: 175 10*3/uL (ref 150–400)
RBC: 4.47 MIL/uL (ref 3.87–5.11)
RDW: 12.3 % (ref 11.5–15.5)
WBC: 9.1 10*3/uL (ref 4.0–10.5)

## 2015-09-26 MED ORDER — POTASSIUM CHLORIDE 20 MEQ/15ML (10%) PO SOLN: 10.0000 meq | Freq: Every day | ORAL | Status: DC

## 2015-09-26 MED ORDER — METFORMIN HCL 500 MG PO TABS: 500.0000 mg | ORAL_TABLET | Freq: Two times a day (BID) | ORAL | 2 refills | Status: DC

## 2015-09-26 MED ORDER — INSULIN NPH (HUMAN) (ISOPHANE) 100 UNIT/ML ~~LOC~~ SUSP: 18.0000 [IU] | Freq: Two times a day (BID) | SUBCUTANEOUS | 11 refills | Status: AC

## 2015-09-26 NOTE — Clinical Social Work Note (Signed)
RN Report [place] via [place] Report # [phone]  Pt is ready for discharge today to [facility]. CSW was notified the [facility] gave auth for STR. Pt and husband are aware and agreeable to discharge plan. Facility has received discharge information and is ready to admit pt. RN will call report. [facility] will provide transportation. CSW is signing off as no further needs identified.   Darden Dates, MSW, LCSW  Clinical Social Worker [phone]

## 2015-09-26 NOTE — Progress Notes (Signed)
Speech Language Pathology Treatment: Cognitive-Linquistic  Patient Details Name: Rachel [name] MRN: [id number] DOB: 11/15/1950 Today's Date: 09/26/2015 Time: 1135-1208 SLP Time Calculation (min) (ACUTE ONLY): 33 min  Assessment / Plan / Recommendation Clinical Impression  Pt seen for followup cognitive-linguistic therapy. Pt was initially lethargic and required max stim to alert, however once she was alert, she continued to require max verbal/tactile/visual stim to maintain attention to basic tasks. Initiation of movement was impaired. Pt mouthed at the word level in response to questions x 2, but did not demonstrate any vocalizations. For the majority of questions and requests, no response verbal or otherwise was generated.     HPI HPI: Pt is a 65 y.o. female with PMH of Rosai-Dorfman disease, resection of an occipital mass lesion, hypertension, paroxysmal atrial fibrillation, hyperlipidemia, schizophrenia and diabetes mellitus, presenting with new onset weakness involving right leg for 2 days, MRI of the brain showed findings indicative of acute to subacute left frontoparietal subarachnoid hemorrhage, and addition to enhancing mass lesions in the left middle cranial fossa and left lateral temporal region.       SLP Plan  Continue with current plan of care     Recommendations  Medication Administration: Crushed with puree Supervision: Trained caregiver to feed patient             Follow up Recommendations: Skilled Nursing facility;24 hour supervision/assistance Plan: Continue with current plan of care     [place], [place] Pager [phone] 09/26/2015, 12:23 PM

## 2015-09-26 NOTE — Clinical Social Work Placement (Signed)
   CLINICAL SOCIAL WORK PLACEMENT  NOTE  Date:  09/26/2015  Patient Details  Name: Rachel [name] MRN: [id number] Date of Birth: January 12, 1951  Clinical Social Work is seeking post-discharge placement for this patient at the [place] level of care (*CSW will initial, date and re-position this form in  chart as items are completed):  Yes   Patient/family provided with [facility] Work Department's list of facilities offering this level of care within the geographic area requested by the patient (or if unable, by the patient's family).  Yes   Patient/family informed of their freedom to choose among providers that offer the needed level of care, that participate in Medicare, Medicaid or managed care program needed by the patient, have an available bed and are willing to accept the patient.  Yes   Patient/family informed of [facility]'s ownership interest in [facility] and [facility], as well as of the fact that they are under no obligation to receive care at these facilities.  PASRR submitted to EDS on 09/21/15     PASRR number received on 09/25/15     Existing PASRR number confirmed on       FL2 transmitted to all facilities in geographic area requested by pt/family on 09/24/15     FL2 transmitted to all facilities within larger geographic area on       Patient informed that his/her managed care company has contracts with or will negotiate with certain facilities, including the following:        Yes   Patient/family informed of bed offers received.  Patient chooses bed at [facility]     Physician recommends and patient chooses bed at      Patient to be transferred to [facility] on 09/26/15.  Patient to be transferred to facility by [facility]     Patient family notified on 09/26/15 of transfer.  Name of family member notified:  Pt's husband     PHYSICIAN        Additional Comment:    _______________________________________________ Darden Dates, LCSW 09/26/2015, 4:18 PM

## 2015-09-26 NOTE — Care Management Note (Signed)
Case Management Note  Patient Details  Name: Rachel [name] MRN: [id number] Date of Birth: 05-26-1950  Subjective/Objective:                    Action/Plan: Plan is for patient to discharge to [facility] SNF today. No further needs per CM.   Expected Discharge Date:                  Expected Discharge Plan:  Skilled Nursing Facility  In-House Referral:  Clinical Social Work  Discharge planning Services  CM Consult  Post Acute Care Choice:    Choice offered to:     DME Arranged:    DME Agency:     HH Arranged:    [place] Agency:     Status of Service:  Completed, signed off  If discussed at [employer], dates discussed:    Additional Comments:  Pollie Friar, RN 09/26/2015, 3:56 PM

## 2015-09-26 NOTE — Discharge Summary (Signed)
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  Past Medical History:  Diagnosis Date  . Blindness   . Diabetes mellitus type I ([place])   . GERD (gastroesophageal reflux disease)   . Hyperlipidemia   . Paroxysmal atrial fibrillation ([facility])   . Schizophrenia ([place])   . Systemic hypertension   . Vertigo    Past Surgical History:  Procedure Laterality Date  . BRAIN TUMOR EXCISION    . NM MYOCAR PERF WALL MOTION  02/01/2007   no significant ischemia  . US ECHOCARDIOGRAPHY  12/20/2003   mild mitral annular ca+,mild MR,TR,PI,AOV mildly sclerotic      Medication List    STOP taking these medications   aspirin 81 MG tablet   meclizine 25 MG tablet Commonly known as:  ANTIVERT     TAKE these medications   acetaminophen 500 MG tablet Commonly known as:  TYLENOL Take 1,000 mg by mouth every 6 (six) hours as needed for mild pain.   ALPRAZolam 0.25 MG tablet Commonly known as:  XANAX Take 1 tablet (0.25 mg total) by mouth 3 (three) times daily.   digoxin 0.125 MG tablet Commonly known as:  LANOXIN Take 1 tablet (0.125 mg total) by mouth daily.   diltiazem 180 MG 24 hr capsule Commonly known as:  CARDIZEM CD Take 1 capsule (180 mg total) by mouth daily.   famotidine 20 MG tablet Commonly known as:  PEPCID Take 20 mg by mouth daily.   insulin NPH Human 100 UNIT/ML injection Commonly  known as:  HUMULIN N,NOVOLIN N Inject 0.18 mLs (18 Units total) into the skin 2 (two) times daily at 8 am and 10 pm. What changed:  how much to take  when to take this   KLOR-CON 10 10 MEQ tablet Generic drug:  potassium chloride Take 10 mEq by mouth daily.   loratadine 10 MG tablet Commonly known as:  CLARITIN Take 10 mg by mouth daily as needed for allergies.   metFORMIN 500 MG tablet Commonly known as:  GLUCOPHAGE Take 1 tablet (500 mg total) by mouth 2 (two) times daily with a meal.   PARoxetine 40 MG tablet Commonly known as:  PAXIL Take 1 tablet (40 mg total) by mouth daily. What changed:  when to take this   risperiDONE 2 MG tablet Commonly known as:  RISPERDAL Take 1 tablet (2 mg total) by mouth at bedtime.   rosuvastatin 10 MG tablet Commonly known as:  CRESTOR Take 1 tablet (10 mg total) by mouth daily. What changed:  when to take this   traZODone 150 MG tablet Commonly known as:  DESYREL Take 75 mg by mouth at bedtime.       LABORATORY STUDIES CBC    Component Value Date/Time   WBC 9.1 09/26/2015 0628   RBC 4.47 09/26/2015 0628   HGB 13.5 09/26/2015 0628  HCT 40.8 09/26/2015 0628   PLT 175 09/26/2015 0628   MCV 91.3 09/26/2015 0628   MCH 30.2 09/26/2015 0628   MCHC 33.1 09/26/2015 0628   RDW 12.3 09/26/2015 0628   LYMPHSABS 1.9 09/17/2015 1231   MONOABS 0.8 09/17/2015 1231   EOSABS 0.1 09/17/2015 1231   BASOSABS 0.1 09/17/2015 1231   CMP    Component Value Date/Time   NA 139 09/26/2015 0628   K 4.2 09/26/2015 0628   CL 104 09/26/2015 0628   CO2 28 09/26/2015 0628   GLUCOSE 101 (H) 09/26/2015 0628   BUN 7 09/26/2015 0628   CREATININE 0.61 09/26/2015 0628   CREATININE 0.81 09/11/2013 1101   CALCIUM 10.3 09/26/2015 0628   PROT 6.7 09/17/2015 1231   ALBUMIN 3.8 09/17/2015 1231   AST 19 09/17/2015 1231   ALT 15 09/17/2015 1231   ALKPHOS 56 09/17/2015 1231   BILITOT 0.5 09/17/2015 1231   GFRNONAA >60 09/26/2015 0628   GFRAA >60  09/26/2015 0628   COAGS Lab Results  Component Value Date   INR 1.01 09/17/2015   INR 1.03 12/04/2013   Lipid Panel    Component Value Date/Time   CHOL 127 09/11/2013 1101   TRIG 45 09/11/2013 1101   HDL 59 09/11/2013 1101   CHOLHDL 2.2 09/11/2013 1101   VLDL 9 09/11/2013 1101   LDLCALC 59 09/11/2013 1101   HgbA1C  Lab Results  Component Value Date   HGBA1C 7.9 (H) 09/19/2015   Cardiac Panel (last 3 results) No results for input(s): CKTOTAL, CKMB, TROPONINI, RELINDX in the last 72 hours. Urinalysis    Component Value Date/Time   COLORURINE AMBER (A) 09/17/2015 1658   APPEARANCEUR CLEAR 09/17/2015 1658   LABSPEC 1.029 09/17/2015 1658   PHURINE 5.0 09/17/2015 1658   GLUCOSEU >1000 (A) 09/17/2015 1658   HGBUR TRACE (A) 09/17/2015 1658   BILIRUBINUR NEGATIVE 09/17/2015 1658   KETONESUR 15 (A) 09/17/2015 1658   PROTEINUR 30 (A) 09/17/2015 1658   UROBILINOGEN 0.2 12/04/2013 1518   NITRITE NEGATIVE 09/17/2015 1658   LEUKOCYTESUR NEGATIVE 09/17/2015 1658   Urine Drug Screen     Component Value Date/Time   LABOPIA NONE DETECTED 08/15/2015 1327   COCAINSCRNUR NONE DETECTED 08/15/2015 1327   LABBENZ POSITIVE (A) 08/15/2015 1327   AMPHETMU NONE DETECTED 08/15/2015 1327   THCU NONE DETECTED 08/15/2015 1327   LABBARB NONE DETECTED 08/15/2015 1327    Alcohol Level    Component Value Date/Time   ETH <5 08/15/2015 1427     SIGNIFICANT DIAGNOSTIC STUDIES MRI BRAIN With and Without Contrast  09/24/2015 1. Stable dural thickening and foci of masslike enhancement overlying the bilateral cavernous sinuses/Meckel's caves, within the right optic canal, and overlying the left middle cranial fossa consistent patient's history of Rosai-Dorfman disease. 2. Increasing subarachnoid enhancement in the region of hemorrhage in the left paramedian frontal and parietal lobes may be reactive due to the presence of blood products, but infection/inflammation or proliferation of histiocytosis is  possible and follow-up is recommended. 3. No evidence of acute infarct, new intracranial hemorrhage, or new focal mass effect. 4. Stable partial empty sella turcica, and large Meckel's caves, and possible papilledema can be seen with increased intracranial pressure, such as pseudotumor cerebri.  09/17/2015 Progressive extra-axial/ dural enhancement consistent with patient's history of Rosai-Dorfman disease. These areas are consistent with lymphoproliferative changes. Findings include: Diffuse linear dural enhancement. Anterior left middle cranial fossa 1.5 x 2.5 x 2.6 cm extra-axial enhancing mass previously measuring up to 2.2 cm. Lateral  left temporal region 1 x 1.2 x 1 cm enhancing lesion previously not visualized. Plaque-like enhancement anterior right middle cranial fossa Involving the right cavernous sinus. Enhancement along the optic canal greater on the right with extension along the posterior aspect of the right optic nerve. Abnormality involving the medial aspect of the posterior left frontal -parietal lobe is T1 hyperintense with gradient abnormality suggesting this may represent blood or melanin. Atypical extension of lymphoproliferative changes also a consideration.  As there is no loss of volume, hemorrhagic infarct is a less likely consideration. No acute thrombotic infarct seen separate from this region. Remote occipital surgery with hemorrhagic breakdown products in the operative site. Global atrophy without hydrocephalus. Partially empty sella and slightly prominent peri optic spaces.  CT Head Without Contrast 09/18/2015 1. MRI abnormality yesterday in the posterior left interhemispheric fissure does correspond to abnormal intermediate to high density material on CT, new since 07/26/2015. This is most suspicious for a small volume of subarachnoid hemorrhage, with progression of the underlying chronic lymphoproliferative abnormality felt less likely. 2. Otherwise stable  noncontrast CT appearance the brain since 07/26/2015.  See also Brain MRI report from yesterday. Study discussed by telephone with Dr. Leonie Man On 09/18/2015 at 11:27 .  CT Head Without Contrast 09/19/2015 Improving subarachnoid hemorrhage left convexity. No new area of hemorrhage Diffusely abnormal calvarium of uncertain etiology. Recommend evaluation for multiple myeloma.  EEG  Posterior background slowing with no epileptiform discharges.    HISTORY OF PRESENT ILLNESS Rachel [name] is an 65 y.o. female with a history of Rosai-Dorfman disease, resection of an occipital mass lesion, hypertension, paroxysmal atrial fibrillation, hyperlipidemia, schizophrenia and diabetes mellitus, presenting with new onset weakness involving right leg for 2 days. She has no previous history of stroke no TIA. She's been taking aspirin 81 mg per day. MRI of the brain showed findings indicative of acute to subacute left frontoparietal subarachnoid hemorrhage, and addition to enhancing mass lesions in the left middle cranial fossa and left lateral temporal region. Plaque-like enhancement was noted involving anterior middle cranial fossa on the right, as well as an enhancing lesion involving right optic nerve. Postsurgical changes in the occipital region noted. Patient has had no change in speech and no symptoms involving the right upper extremity. She has chronic right facial weakness which is unchanged.   HOSPITAL COURSE Rachel [name] is a 65 y.o. female with history of Rosai-Dorfman disease, resection of an occipital mass lesion, hypertension, paroxysmal atrial fibrillation, hyperlipidemia, schizophrenia and diabetes mellitus presenting with R leg weakness x 2 days. MRI showed L frontoparietal medial SAH. Her home ASA discontinued. BP and glucose were controlled. PT/OT recommended CIR first and then changed to SNF. On 09/23/15 found to have right arm weakness, husband stated that she had on and off right arm  weakness. EEG negative. Repeat MRI no change. Continued PT/OT. Transfer to SNF for continued rehab.   Recurrent R leg weakness with enhancing L frontal parasaggital lesion felt to be Small localized subacute [facility]   Neurosurgery consult Dr. Leonie Man discussed at length with Dr. Christella Noa, who did not feel lesion was related to Rosai-Dorfman   MRI  Lesions left frontoparietal medial likely traumatic SAH  Doubt aneurysm or vascular abnormality. Dr. Christella Noa to discuss with neuroradiologist -> all agree subacute localized SAH. Rosai-Drofman lesions are stable.  CT head shows SAH  Repeat CT head unchanged x 2. Some diffuse abnormal calvarium of unknown etiology  Repeat MRI stable dural thickening/masslike enhancement. Increased SAH enhancement of L paramedial  frontal and parietal lobes (reactive vs infection/inflammation). No new infarct.   EEG generalized slowing, no seizure  SCDs for VTE prophylaxis  Diet Carb Modified Fluid consistency: Thin; Room service appropriate? Yes  aspirin 81 mg daily prior to admission, now on No antithrombotic. Hold aspirin given SAH.  Therapy recommendations: SNF  Disposition:  SNF  Plan repeat MRI in 3 months  RUE plegia - new onset   Found to have RUE plegia on 09/23/15  Husband claimed it was intermittent, comes and goes for the last several days too  MR repeat with and without contrast - no new infarct. Stable dural thickening. Increased SAH enhancement   Continue PT/OT  Paroxysmal Atrial Fibrillation  Home anticoagulation:  none   none AC recommended due to [facility] and poor long term prognosis  Hyperlipidemia  Home meds:  crestor 10 mg daily  Resumed statin in hospital   Continue statin at discharge  Diabetes type I, uncontrolled  HgbA1c 7.9, goal < 7.0  On Novolog sensitive SSI with meals  metformin added  Increased NPH to 18 units bid  CBG better on discharge  Other Stroke Risk Factors  Overweight, Body mass index is 28.14  kg/m., recommend weight loss, diet and exercise as appropriate   Other Active Problems  Blind right eye  Schizophrenia  Hx occipital brain tumor excison  CT head showed lytic skull lesions raising concern for myeloma versus metabolic bone disease   DISCHARGE EXAM Blood pressure (!) 125/59, pulse 63, temperature 97.5 F (36.4 C), temperature source Oral, resp. rate 18, height 5' 7"  (1.702 m), weight 179 lb 11.2 oz (81.5 kg), SpO2 100 %.  General - Well nourished, well developed, abulia, akinetic mutism, significant psychomotor slowing.  Ophthalmologic - Fundi not visualized due to noncooperation.  Cardiovascular - Regular rate and rhythm.  Neuro - awake, alert, orientated to self and husband, not orientated to time, place or situation. Able to follow simple commands, however significant psychomotor slowing. Paucity of speech, abulia and akinetic mutism. Able to name and repeat, however, dysarthric. Extraocular movements are full range without nystagmus. Blinks to threat bilaterally. Right eye blindness to only light preception from this optic nerve involvement. Slight restriction of peripheral vision in the left eye. Fundi were not visualized. Right lower facial weakness which is chronic. Tongue midline. Motor system exam showed right hemiplegia, LUE and LLE spontaneous movement and against gravity at least. Sensation symmetrical bilaterally, DTR 1+, muscle tone decreased, no Babinski. Coordination not cooperative and gait not tested.  Discharge Diet   Diet Carb Modified Fluid consistency: Thin; Room service appropriate? Yes liquids  DISCHARGE PLAN  Disposition:  SNF  Ongoing risk factor control by Primary Care Physician at time of discharge  Follow-up Maggie Font, MD in 2 weeks.  Follow-up with Dr. Antony Contras, Stroke Clinic in 2 months, office to schedule an appointment.  35 minutes were spent preparing discharge.  Rosalin Hawking, MD PhD Stroke Neurology 09/26/2015 4:16  PM

## 2015-09-30 LAB — PROTEIN ELECTROPHORESIS, SERUM
A/G Ratio: 1.1 (ref 0.7–1.7)
ALBUMIN ELP: 3.2 g/dL (ref 2.9–4.4)
ALPHA-1-GLOBULIN: 0.2 g/dL (ref 0.0–0.4)
ALPHA-2-GLOBULIN: 0.8 g/dL (ref 0.4–1.0)
BETA GLOBULIN: 0.9 g/dL (ref 0.7–1.3)
GAMMA GLOBULIN: 1 g/dL (ref 0.4–1.8)
Globulin, Total: 2.9 g/dL (ref 2.2–3.9)
TOTAL PROTEIN ELP: 6.1 g/dL (ref 6.0–8.5)

## 2015-10-01 ENCOUNTER — Non-Acute Institutional Stay (SKILLED_NURSING_FACILITY): Payer: BLUE CROSS/BLUE SHIELD | Admitting: Internal Medicine

## 2015-10-01 ENCOUNTER — Encounter: Payer: Self-pay | Admitting: Internal Medicine

## 2015-10-01 DIAGNOSIS — M899 Disorder of bone, unspecified: Secondary | ICD-10-CM | POA: Diagnosis not present

## 2015-10-01 DIAGNOSIS — I10 Essential (primary) hypertension: Secondary | ICD-10-CM | POA: Diagnosis not present

## 2015-10-01 DIAGNOSIS — Z8669 Personal history of other diseases of the nervous system and sense organs: Secondary | ICD-10-CM

## 2015-10-01 DIAGNOSIS — E119 Type 2 diabetes mellitus without complications: Secondary | ICD-10-CM | POA: Diagnosis not present

## 2015-10-01 DIAGNOSIS — Z87898 Personal history of other specified conditions: Secondary | ICD-10-CM

## 2015-10-01 DIAGNOSIS — I609 Nontraumatic subarachnoid hemorrhage, unspecified: Secondary | ICD-10-CM | POA: Diagnosis not present

## 2015-10-01 DIAGNOSIS — F209 Schizophrenia, unspecified: Secondary | ICD-10-CM | POA: Diagnosis not present

## 2015-10-01 DIAGNOSIS — I480 Paroxysmal atrial fibrillation: Secondary | ICD-10-CM

## 2015-10-01 NOTE — Progress Notes (Signed)
Patient ID: Rachel [name], female   DOB: [birth year], 65 y.o.   MRN: [id number]    HISTORY AND PHYSICAL   DATE: 10/01/15  Location:    [facility] Room Number: 155 B Place of Service: SNF (31)   Extended Emergency Contact Information Primary Emergency Contact: Hiltner,Johnny Address: [address]          [place], [place] [address] [place] Phone: [phone] Relation: Spouse  Advanced Directive information Does patient have an advance directive?: No, Would patient like information on creating an advanced directive?: No - patient declined information  Chief Complaint  Patient presents with  . 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 CT head showed lytic lesions. Recommended eval for multiple myeloma. EEG showed no sz activity. MRI brain showed acute to subacute left frontoparietal SAH and enhancing mass lesions in left middle cranial fossa, left lateral temporal region, right anterior middle cranial fossa, right optic nerve. ASA held. Recommended repeat MRI in 3 mos. CBC and BMP no significant abnormalities at hospital d/c. A1c 7.9%; SPEP neg and no M spike seen; digoxin 0.6; albumin 3.8; She presents to SNF for short term rehab.  She has no c/o today. No numbness/tingling. Appetite ok. Sleeps well. CBG 165 today. No low BS reactions. She is a poor historian due to expressive aphasia. Hx obtained from chart.  PAF/HTN - HR/BP controlled on digoxin and cardizem CD.  Schizophrenia/depression/insomnia - mood stable on alprazolam, paxil, trazodone and risperdal. Followed by psych  GERD -stable on pepcid.   Hyperlipidemia - stable on crestor. No myalgias   DM - A1c 7.9%. CBG 165 today. No low BS reactions. She takes metformin and  Humulin N.   Seasonal allergy - stable on claritin  Hypokalemia - stable on K[place] con  She is legally blind in [place]  Past Medical History:  Diagnosis Date  . Blindness   . Diabetes mellitus type I ([place])   . GERD (gastroesophageal reflux disease)   . Hyperlipidemia   . Paroxysmal atrial fibrillation (HCC)   . Schizophrenia ([place])   . Systemic hypertension   . Vertigo     Past Surgical History:  Procedure Laterality Date  . BRAIN TUMOR EXCISION    . NM MYOCAR PERF WALL MOTION  02/01/2007   no significant ischemia  . US ECHOCARDIOGRAPHY  12/20/2003   mild mitral annular ca+,mild MR,TR,PI,AOV mildly sclerotic    Patient Care Team: Iona Beard, MD as PCP - General (Family Medicine)  Social History   Social History  . Marital status: Married    Spouse name: N/A  . Number of children: N/A  . Years of education: N/A   Occupational History  . Not on file.   Social History Main Topics  . Smoking status: Never Smoker  . Smokeless tobacco: Never Used  . Alcohol use No  . Drug use: No  . Sexual activity: Not on file   Other Topics Concern  . Not on file   Social History Narrative  . No narrative on file     reports that she has never smoked. She has never used smokeless tobacco. She reports that she does not drink alcohol or use drugs.  Family History  Problem Relation Age of Onset  . ADD / ADHD Neg Hx   .  Alcohol abuse Neg Hx   . Drug abuse Neg Hx   . Anxiety disorder Neg Hx   . Bipolar disorder Neg Hx   . Dementia Neg Hx   . Depression Neg Hx   . OCD Neg Hx   . Paranoid behavior Neg Hx   . Schizophrenia Neg Hx   . Seizures Neg Hx   . Sexual abuse Neg Hx   . Physical abuse Neg Hx    Family Status  Relation Status  . Mother Deceased  . Father Deceased  . Brother Alive  . Brother Alive  . Brother Alive  . Maternal Grandfather Deceased  . Maternal Grandmother Deceased  . Paternal Grandfather Deceased  . Paternal Grandmother Deceased  . Neg Hx      Immunization History  Administered Date(s) Administered  . Pneumococcal Polysaccharide-23 09/18/2015    Allergies  Allergen Reactions  . Sulfa Antibiotics Anaphylaxis  . Ibuprofen Other (See Comments)    unknown  . Shellfish Allergy Swelling    Mouth    Medications: Patient's Medications  New Prescriptions   No medications on file  Previous Medications   ACETAMINOPHEN (TYLENOL) 500 MG TABLET    Take 1,000 mg by mouth every 6 (six) hours as needed for mild pain.   ALPRAZOLAM (XANAX) 0.25 MG TABLET    Take 1 tablet (0.25 mg total) by mouth 3 (three) times daily.   DIGOXIN (LANOXIN) 0.125 MG TABLET    Take 1 tablet (0.125 mg total) by mouth daily.   DILTIAZEM (CARDIZEM CD) 180 MG 24 HR CAPSULE    Take 1 capsule (180 mg total) by mouth daily.   FAMOTIDINE (PEPCID) 20 MG TABLET    Take 20 mg by mouth daily.    INSULIN NPH HUMAN (HUMULIN N,NOVOLIN N) 100 UNIT/ML INJECTION    Inject 0.18 mLs (18 Units total) into the skin 2 (two) times daily at 8 am and 10 pm.   KLOR-CON 10 10 MEQ TABLET    Take 10 mEq by mouth daily.    LORATADINE (CLARITIN) 10 MG TABLET    Take 10 mg by mouth daily as needed for allergies.    METFORMIN (GLUCOPHAGE) 500 MG TABLET    Take 1 tablet (500 mg total) by mouth 2 (two) times daily with a meal.   PAROXETINE (PAXIL) 40 MG TABLET    Take 1 tablet (40 mg total) by mouth daily.   RISPERIDONE (RISPERDAL) 2 MG TABLET    Take 1 tablet (2 mg total) by mouth at bedtime.   ROSUVASTATIN (CRESTOR) 10 MG TABLET    Take 10 mg by mouth every evening.   TRAZODONE (DESYREL) 150 MG TABLET    Take 75 mg by mouth at bedtime.  Modified Medications   No medications on file  Discontinued Medications   ROSUVASTATIN (CRESTOR) 10 MG TABLET    Take 1 tablet (10 mg total) by mouth daily.    Review of Systems  Unable to perform ROS: Psychiatric disorder (and expressive aphasia)    Vitals:   10/01/15 1441  BP: (!) 122/55  Pulse: 80  Resp: 20  Temp: (!) 96.9 F (36.1 C)   TempSrc: Oral  SpO2: 95%  Weight: 170 lb (77.1 kg)  Height: 5' 9"  (1.753 m)   Body mass index is 25.1 kg/m.  Physical Exam  Constitutional: She appears well-developed and well-nourished.  Sitting in w/c in NAD  HENT:  Mouth/Throat: Oropharynx is clear and moist. No oropharyngeal exudate.  Eyes: Pupils are equal, round, and  reactive to light. No scleral icterus.  Neck: Neck supple. Carotid bruit is not present. No tracheal deviation present. No thyromegaly present.  Cardiovascular: Normal rate, regular rhythm and intact distal pulses.  Exam reveals no gallop and no friction rub.   Murmur (1/6 SEM) heard. No LE edema b/l. no calf TTP.   Pulmonary/Chest: Effort normal. No stridor. No respiratory distress. She has no wheezes. She has no rales.  Reduced BS left base  Abdominal: Soft. Bowel sounds are normal. She exhibits no distension and no mass. There is no hepatomegaly. There is no tenderness. There is no rebound and no guarding.  Musculoskeletal: She exhibits edema.  Lymphadenopathy:    She has no cervical adenopathy.  Neurological: She is alert.  Expressive aphasia; reduced right grip strength  Skin: Skin is warm and dry. No rash noted.  Psychiatric: She has a normal mood and affect. Her behavior is normal. Her speech is slurred.     Labs reviewed: Admission on 09/17/2015, Discharged on 09/26/2015  No results displayed because visit has over 200 results.    Admission on 08/15/2015, Discharged on 08/18/2015  Component Date Value Ref Range Status  . Sodium 08/15/2015 136  135 - 145 mmol/L Final  . Potassium 08/15/2015 3.5  3.5 - 5.1 mmol/L Final  . Chloride 08/15/2015 106  101 - 111 mmol/L Final  . CO2 08/15/2015 24  22 - 32 mmol/L Final  . Glucose, Bld 08/15/2015 145* 65 - 99 mg/dL Final  . BUN 08/15/2015 6  6 - 20 mg/dL Final  . Creatinine, Ser 08/15/2015 0.72  0.44 - 1.00 mg/dL Final  . Calcium 08/15/2015 9.6  8.9 - 10.3 mg/dL Final  . Total Protein 08/15/2015 7.1  6.5  - 8.1 g/dL Final  . Albumin 08/15/2015 3.7  3.5 - 5.0 g/dL Final  . AST 08/15/2015 15  15 - 41 U/L Final  . ALT 08/15/2015 13* 14 - 54 U/L Final  . Alkaline Phosphatase 08/15/2015 58  38 - 126 U/L Final  . Total Bilirubin 08/15/2015 0.5  0.3 - 1.2 mg/dL Final  . GFR calc non Af Amer 08/15/2015 >60  >60 mL/min Final  . GFR calc Af Amer 08/15/2015 >60  >60 mL/min Final   Comment: (NOTE) The eGFR has been calculated using the CKD EPI equation. This calculation has not been validated in all clinical situations. eGFR's persistently <60 mL/min signify possible Chronic Kidney Disease.   . Anion gap 08/15/2015 6  5 - 15 Final  . WBC 08/15/2015 7.7  4.0 - 10.5 K/uL Final  . RBC 08/15/2015 4.29  3.87 - 5.11 MIL/uL Final  . Hemoglobin 08/15/2015 12.8  12.0 - 15.0 g/dL Final  . HCT 08/15/2015 38.3  36.0 - 46.0 % Final  . MCV 08/15/2015 89.3  78.0 - 100.0 fL Final  . MCH 08/15/2015 29.8  26.0 - 34.0 pg Final  . MCHC 08/15/2015 33.4  30.0 - 36.0 g/dL Final  . RDW 08/15/2015 12.3  11.5 - 15.5 % Final  . Platelets 08/15/2015 233  150 - 400 K/uL Final  . Color, Urine 08/15/2015 YELLOW  YELLOW Final  . APPearance 08/15/2015 CLEAR  CLEAR Final  . Specific Gravity, Urine 08/15/2015 1.010  1.005 - 1.030 Final  . pH 08/15/2015 7.5  5.0 - 8.0 Final  . Glucose, UA 08/15/2015 NEGATIVE  NEGATIVE mg/dL Final  . Hgb urine dipstick 08/15/2015 SMALL* NEGATIVE Final  . Bilirubin Urine 08/15/2015 NEGATIVE  NEGATIVE Final  . Ketones, ur 08/15/2015 NEGATIVE  NEGATIVE  mg/dL Final  . Protein, ur 08/15/2015 NEGATIVE  NEGATIVE mg/dL Final  . Nitrite 08/15/2015 NEGATIVE  NEGATIVE Final  . Leukocytes, UA 08/15/2015 TRACE* NEGATIVE Final  . Alcohol, Ethyl (B) 08/15/2015 <5  <5 mg/dL Final   Comment:        LOWEST DETECTABLE LIMIT FOR SERUM ALCOHOL IS 5 mg/dL FOR MEDICAL PURPOSES ONLY   . Opiates 08/15/2015 NONE DETECTED  NONE DETECTED Final  . Cocaine 08/15/2015 NONE DETECTED  NONE DETECTED Final  .  Benzodiazepines 08/15/2015 POSITIVE* NONE DETECTED Final  . Amphetamines 08/15/2015 NONE DETECTED  NONE DETECTED Final  . Tetrahydrocannabinol 08/15/2015 NONE DETECTED  NONE DETECTED Final  . Barbiturates 08/15/2015 NONE DETECTED  NONE DETECTED Final   Comment:        DRUG SCREEN FOR MEDICAL PURPOSES ONLY.  IF CONFIRMATION IS NEEDED FOR ANY PURPOSE, NOTIFY LAB WITHIN 5 DAYS.        LOWEST DETECTABLE LIMITS FOR URINE DRUG SCREEN Drug Class       Cutoff (ng/mL) Amphetamine      1000 Barbiturate      200 Benzodiazepine   100 Tricyclics       712 Opiates          300 Cocaine          300 THC              50   . Digoxin Level 08/15/2015 0.4* 0.8 - 2.0 ng/mL Final  . Squamous Epithelial / LPF 08/15/2015 0-5* NONE SEEN Final  . WBC, UA 08/15/2015 0-5  0 - 5 WBC/hpf Final  . RBC / HPF 08/15/2015 0-5  0 - 5 RBC/hpf Final  . Bacteria, UA 08/15/2015 RARE* NONE SEEN Final  . Glucose-Capillary 08/15/2015 226* 65 - 99 mg/dL Final  . Comment 1 08/15/2015 Notify RN   Final  . Comment 2 08/15/2015 Document in Chart   Final  . Glucose-Capillary 08/16/2015 161* 65 - 99 mg/dL Final  . Comment 1 08/16/2015 Notify RN   Final  . Comment 2 08/16/2015 Document in Chart   Final  . Glucose-Capillary 08/16/2015 152* 65 - 99 mg/dL Final  . Glucose-Capillary 08/16/2015 350* 65 - 99 mg/dL Final  . Glucose-Capillary 08/16/2015 270* 65 - 99 mg/dL Final  . Glucose-Capillary 08/17/2015 139* 65 - 99 mg/dL Final  . Glucose-Capillary 08/17/2015 320* 65 - 99 mg/dL Final  . Glucose-Capillary 08/18/2015 109* 65 - 99 mg/dL Final  . Glucose-Capillary 08/18/2015 206* 65 - 99 mg/dL Final  . Comment 1 08/18/2015 Document in Chart   Final    Ct Head Wo Contrast  Result Date: 09/19/2015 CLINICAL DATA:  Follow-up subarachnoid hemorrhage. EXAM: CT HEAD WITHOUT CONTRAST TECHNIQUE: Contiguous axial images were obtained from the base of the skull through the vertex without intravenous contrast. COMPARISON:  CT head  09/18/2015 FINDINGS: Subarachnoid hemorrhage left medial convexity shows mild improvement since yesterday. No subdural hemorrhage. No new area of hemorrhage since the prior study. Moderate atrophy most prominent in the frontal and temporal lobes. Negative for hydrocephalus. Enhancing dural-based mass lesions in the middle cranial fossa better seen on MRI. Prior occipital craniectomy. Heterogeneous skull with multiple tiny areas of low-density throughout the skull widely distributed and symmetric. This has progressed since 2015 was not present in 2006. This could be due to metabolic bone disease or myeloma. IMPRESSION: Improving subarachnoid hemorrhage left convexity. No new area of hemorrhage Diffusely abnormal calvarium of uncertain etiology. Recommend evaluation for multiple myeloma. Electronically Signed   By: Juanda Crumble  Carlis Abbott M.D.   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 Abnormal brain MRI yesterday, with possible trace subarachnoid hemorrhage in the posterior left hemisphere,interhemispheric fissure. Initial encounter. EXAM: CT HEAD WITHOUT CONTRAST TECHNIQUE: Contiguous axial images were obtained from the base of the skull through the vertex without intravenous contrast. COMPARISON:  Brain MRI 09/17/2015, noncontrast head CT 07/26/2015, and earlier. FINDINGS: Stable visualized osseous structures. Visualized paranasal sinuses and mastoids are stable and well pneumatized. Negative orbit and scalp soft tissues. Corresponding to the posterior left interhemispheric fissure abnormality on MRI of which was suggestive of hemorrhage there is new focal intermediate and height density (series 2, image 23), where previously normal CSF was present on 07/26/2015. The configuration of this is stable since yesterday. There is no significant regional mass effect. No intraventricular or new extra-axial hemorrhage or abnormality  identified. Stable gray-white matter differentiation throughout the brain. Stable sequelae of suboccipital craniectomy. No cortically based acute infarct identified. IMPRESSION: 1. MRI abnormality yesterday in the posterior left interhemispheric fissure does correspond to abnormal intermediate to high density material on CT, new since 07/26/2015. This is most suspicious for a small volume of subarachnoid hemorrhage, with progression of the underlying chronic lymphoproliferative abnormality felt less likely. 2. Otherwise stable noncontrast CT appearance the brain since 07/26/2015. See also Brain MRI report from yesterday. Study discussed by telephone with Dr. Leonie Man On 09/18/2015 at 11:27 . Electronically Signed   By: Genevie Ann M.D.   On: 09/18/2015 11:35   Mr Jeri Cos WG Contrast  Result Date: 09/24/2015 CLINICAL DATA:  65 y/o F; history of Rosai-Dorfman disease status post resection of an occipital mass lesion presenting with new onset weakness involving the right leg for 2 days. EXAM: MRI HEAD WITHOUT AND WITH CONTRAST TECHNIQUE: Multiplanar, multiecho pulse sequences of the brain and surrounding structures were obtained without and with intravenous contrast. CONTRAST:  70m MULTIHANCE GADOBENATE DIMEGLUMINE 529 MG/ML IV SOLN COMPARISON:  MRI of the brain dated 09/17/2015. CT of the head dated 09/19/2015. FINDINGS: Brain: No diffusion restriction to suggest acute infarct. There are scattered foci of susceptibility hypointensity within the bilateral parietal lobes, bilateral frontal lobes, left anterior temporal lobe, and left cerebellar hemisphere stable from the prior MRI likely representing hemosiderin deposition. Postsurgical changes related to occipital surgery with minimal stable hemosiderin staining. There is stable moderate brain parenchymal atrophy and mild chronic microvascular ischemic changes. Extra-axial space: No hydrocephalus. There is dural thickening and enhancement overlying bilateral Meckel's caves  and the cavernous sinuses, right greater than left, that is stable. Right-sided dural enhancement over the cavernous sinus extends into the right orbital apex. There are stable masslike foci of enhancement within the left middle cranial fossa along the sphenoid wing and additional focus over the lateral aspect of the left middle cranial fossa (series 12, image 23 and 19). There is stable subacute subarachnoid hemorrhage over the left paramedian frontal parietal convexity. There is a moderate increase of enhancement within the subarachnoid space in the region of hemorrhage, series 13, image 15. No new hemorrhage is identified. Proximal intracranial flow voids are maintained. Partially empty sella turcica and enlargement of Meckel's caves bilaterally. Other: Large left and small right maxillary sinus mucous retention cysts. No abnormal signal of the mastoid air cells. Slight optic disc prominence may represent papilledema. Unchanged minimal postsurgical changes in the occipital region from prior surgery. The calvarium is otherwise unremarkable. IMPRESSION: 1. Stable dural thickening and foci of  masslike enhancement overlying the bilateral cavernous sinuses/Meckel's caves, within the right optic canal, and overlying the left middle cranial fossa consistent patient's history of Rosai-Dorfman disease. 2. Increasing subarachnoid enhancement in the region of hemorrhage in the left paramedian frontal and parietal lobes may be reactive due to the presence of blood products, but infection/inflammation or proliferation of histiocytosis is possible and follow-up is recommended. 3. No evidence of acute infarct, new intracranial hemorrhage, or new focal mass effect. 4. Stable partial empty sella turcica, and large Meckel's caves, and possible papilledema can be seen with increased intracranial pressure, such as pseudotumor cerebri. Electronically Signed   By: Kristine Garbe M.D.   On: 09/24/2015 22:33   Mr Jeri Cos SA  Contrast  Addendum Date: 09/17/2015   ADDENDUM REPORT: 09/17/2015 19:18 ADDENDUM: I discussed with Dr. Lita Mains that the abnormality involving the medial gyri of the posterior left frontal -parietal lobe (central sulcus region) is new compared to 12/04/2013 MR and not appreciated as hyperdense blood on the 07/26/2015 CT. Therefore this needs to be treated as if this represents subarachnoid hemorrhage (acute to subacute). Close follow-up MR imaging in the next 2 weeks would prove helpful. Electronically Signed   By: Genia Del M.D.   On: 09/17/2015 19:18   Result Date: 09/17/2015 CLINICAL DATA:  65 year old diabetic female with hyperlipidemia presenting with leg weakness for 2 days. Rosai-Dorfman disease. Initial encounter. EXAM: MRI HEAD WITHOUT AND WITH CONTRAST TECHNIQUE: Multiplanar, multiecho pulse sequences of the brain and surrounding structures were obtained without and with intravenous contrast. CONTRAST:  16m MULTIHANCE GADOBENATE DIMEGLUMINE 529 MG/ML IV SOLN COMPARISON:  07/26/2015 head CT.  12/05/2014 brain MR. FINDINGS: Progressive extra-axial/ dural enhancement consistent with patient's history of Rosai-Dorfman disease. These areas are consistent with lymphoproliferative changes. Findings include: Diffuse linear dural enhancement. Anterior left middle cranial fossa 1.5 x 2.5 x 2.6 cm extra-axial enhancing mass previously measuring up to 2.2 cm. Lateral left temporal region 1 x 1.2 x 1 cm enhancing lesion previously not visualized. Plaque-like enhancement anterior right middle cranial fossa. Involving the right cavernous sinus. Enhancement along the optic canal greater on the right with extension along the posterior aspect of the right optic nerve. Abnormality involving the medial aspect of the posterior left frontal -parietal lobe is T1 hyperintense with gradient abnormality suggesting this may represent blood or melanin. Atypical extension of lymphoproliferative changes also a consideration. As  there is no loss of volume, hemorrhagic infarct is a less likely consideration. No acute thrombotic infarct seen separate from this region. Remote occipital surgery with hemorrhagic breakdown products in the operative site. Global atrophy without hydrocephalus. Major intracranial vascular structures are patent. Partially empty expanded sella. Mild prominence peri optic spaces. Minimal flattening therefore mild increased intracranial pressure not excluded. Appearance unchanged. IMPRESSION: Progressive extra-axial/ dural enhancement consistent with patient's history of Rosai-Dorfman disease. These areas are consistent with lymphoproliferative changes. Findings include: Diffuse linear dural enhancement. Anterior left middle cranial fossa 1.5 x 2.5 x 2.6 cm extra-axial enhancing mass previously measuring up to 2.2 cm. Lateral left temporal region 1 x 1.2 x 1 cm enhancing lesion previously not visualized. Plaque-like enhancement anterior right middle cranial fossa. Involving the right cavernous sinus. Enhancement along the optic canal greater on the right with extension along the posterior aspect of the right optic nerve. Abnormality involving the medial aspect of the posterior left frontal -parietal lobe is T1 hyperintense with gradient abnormality suggesting this may represent blood or melanin. Atypical extension of lymphoproliferative changes also a consideration. As there  is no loss of volume, hemorrhagic infarct is a less likely consideration. No acute thrombotic infarct seen separate from this region. Remote occipital surgery with hemorrhagic breakdown products in the operative site. Global atrophy without hydrocephalus. Partially empty sella and slightly prominent peri optic spaces. Electronically Signed: By: Genia Del M.D. On: 09/17/2015 19:02   Chest Port 1 View  Result Date: 09/17/2015 CLINICAL DATA:  Stroke. EXAM: PORTABLE CHEST 1 VIEW COMPARISON:  08/15/2015 FINDINGS: Heart is mildly enlarged. There  are no focal consolidations or pleural effusions. No pulmonary edema. IMPRESSION: Cardiomegaly.  No evidence for acute pulmonary abnormality. Electronically Signed   By: Nolon Nations M.D.   On: 09/17/2015 23:59     Assessment/Plan   ICD-9-CM ICD-10-CM   1. Lytic bone lesions on xray 733.90 M89.9    on CT head  2. SAH (subarachnoid hemorrhage) (HCC) 430 I60.9   3. Diabetes mellitus type 2 in nonobese (HCC) 250.00 E11.9   4. Essential hypertension 401.9 I10   5. PAF (paroxysmal atrial fibrillation) (HCC) 427.31 I48.0   6. Schizophrenia, unspecified type ([place])  F20.9   7. H/O brain tumor V12.49 Z86.69     Check SPEP/UPEP and serum IFE. May need oncology eval  Cont current meds as ordered  PT/OT ST as ordered  Follow CBGs closely  F/u with specialists as inidcated  GOAL: short term rehab and d/c home when medically appropriate. Communicated with pt and nursing.  Will follow  Dawne Casali S. Perlie Gold  [facility] [address] [place], [place] [address] [phone] Cell (Monday-Friday 8 AM - 5 PM) [phone] After 5 PM and follow prompts

## 2015-10-02 ENCOUNTER — Ambulatory Visit: Payer: Self-pay | Admitting: Cardiovascular Disease

## 2015-10-11 ENCOUNTER — Other Ambulatory Visit: Payer: Self-pay | Admitting: *Deleted

## 2015-10-11 MED ORDER — ALPRAZOLAM 0.25 MG PO TABS: 0.2500 mg | ORAL_TABLET | Freq: Three times a day (TID) | ORAL | 0 refills | Status: DC

## 2015-10-11 NOTE — Telephone Encounter (Signed)
[facility] 

## 2015-10-22 ENCOUNTER — Encounter: Payer: Self-pay | Admitting: Internal Medicine

## 2015-10-22 ENCOUNTER — Non-Acute Institutional Stay (SKILLED_NURSING_FACILITY): Payer: BLUE CROSS/BLUE SHIELD | Admitting: Internal Medicine

## 2015-10-22 DIAGNOSIS — I480 Paroxysmal atrial fibrillation: Secondary | ICD-10-CM

## 2015-10-22 DIAGNOSIS — I10 Essential (primary) hypertension: Secondary | ICD-10-CM

## 2015-10-22 DIAGNOSIS — H540 Blindness, both eyes: Secondary | ICD-10-CM | POA: Diagnosis not present

## 2015-10-22 DIAGNOSIS — F489 Nonpsychotic mental disorder, unspecified: Secondary | ICD-10-CM

## 2015-10-22 DIAGNOSIS — I609 Nontraumatic subarachnoid hemorrhage, unspecified: Secondary | ICD-10-CM | POA: Diagnosis not present

## 2015-10-22 DIAGNOSIS — F209 Schizophrenia, unspecified: Secondary | ICD-10-CM

## 2015-10-22 DIAGNOSIS — E119 Type 2 diabetes mellitus without complications: Secondary | ICD-10-CM | POA: Diagnosis not present

## 2015-10-22 DIAGNOSIS — H543 Unqualified visual loss, both eyes: Secondary | ICD-10-CM

## 2015-10-22 DIAGNOSIS — M899 Disorder of bone, unspecified: Secondary | ICD-10-CM

## 2015-10-22 DIAGNOSIS — F329 Major depressive disorder, single episode, unspecified: Secondary | ICD-10-CM | POA: Diagnosis not present

## 2015-10-22 DIAGNOSIS — F5105 Insomnia due to other mental disorder: Secondary | ICD-10-CM

## 2015-10-22 DIAGNOSIS — E785 Hyperlipidemia, unspecified: Secondary | ICD-10-CM | POA: Diagnosis not present

## 2015-10-22 DIAGNOSIS — F32A Depression, unspecified: Secondary | ICD-10-CM

## 2015-10-22 NOTE — Progress Notes (Signed)
Patient ID: Rachel [name], female   DOB: May 25, 1950, 65 y.o.   MRN: [id number]    DATE:  10/22/2015  Location:    [facility] Room Number: 155 B Place of Service: SNF (31)   Extended Emergency Contact Information Primary Emergency Contact: Obara,Johnny Address: [address]          [place], [place] [address] [place] Phone: [phone] Relation: Spouse  Advanced Directive information Does patient have an advance directive?: No, Would patient like information on creating an advanced directive?: No - patient declined information  Chief Complaint  Patient presents with  . Discharge Note    HPI:  65 yo female short term rehab resident seen today for d/c from SNF. She will be d/c'd home with [facility] nursing (medication mx), PT/OT(gait training/balance/transfers). She will require DME 3-in-1 commode and standard w/c. She has no c/o today. She has appt to see PCP today. No nursing issues. No falls.  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 CT head showed lytic lesions. Recommended eval for multiple myeloma. EEG showed no sz activity. MRI brain showed acute to subacute left frontoparietal SAH and enhancing mass lesions in left middle cranial fossa, left lateral temporal region, right anterior middle cranial fossa, right optic nerve. ASA held. Recommended repeat MRI in 3 mos. CBC and BMP no significant abnormalities at hospital d/c. A1c 7.9%. She had an uncomplicated stay at SNF   Past Medical History:  Diagnosis Date  . Blindness   . Diabetes mellitus type I ([place])   . GERD (gastroesophageal reflux disease)   . Hyperlipidemia   . Paroxysmal atrial fibrillation (HCC)   . Schizophrenia ([place])   . Systemic hypertension   . Vertigo     Past Surgical History:  Procedure Laterality Date  .  BRAIN TUMOR EXCISION    . NM MYOCAR PERF WALL MOTION  02/01/2007   no significant ischemia  . US ECHOCARDIOGRAPHY  12/20/2003   mild mitral annular ca+,mild MR,TR,PI,AOV mildly sclerotic    Patient Care Team: Iona Beard, MD as PCP - General (Family Medicine)  Social History   Social History  . Marital status: Married    Spouse name: N/A  . Number of children: N/A  . Years of education: N/A   Occupational History  . Not on file.   Social History Main Topics  . Smoking status: Never Smoker  . Smokeless tobacco: Never Used  . Alcohol use No  . Drug use: No  . Sexual activity: Not on file   Other Topics Concern  . Not on file   Social History Narrative  . No narrative on file     reports that she has never smoked. She has never used smokeless tobacco. She reports that she does not drink alcohol or use drugs.  Family History  Problem Relation Age of Onset  . ADD / ADHD Neg Hx   . Alcohol abuse Neg Hx   . Drug abuse Neg Hx   . Anxiety disorder Neg Hx   . Bipolar disorder Neg Hx   . Dementia Neg Hx   . Depression Neg Hx   . OCD Neg Hx   . Paranoid behavior Neg Hx   . Schizophrenia Neg Hx   . Seizures Neg Hx   . Sexual abuse Neg Hx   . Physical abuse Neg Hx  Family Status  Relation Status  . Mother Deceased  . Father Deceased  . Brother Alive  . Brother Alive  . Brother Alive  . Maternal Grandfather Deceased  . Maternal Grandmother Deceased  . Paternal Grandfather Deceased  . Paternal Grandmother Deceased  . Neg Hx     Immunization History  Administered Date(s) Administered  . Pneumococcal Polysaccharide-23 09/18/2015    Allergies  Allergen Reactions  . Sulfa Antibiotics Anaphylaxis  . Ibuprofen Other (See Comments)    unknown  . Shellfish Allergy Swelling    Mouth    Medications: Patient's Medications  New Prescriptions   No medications on file  Previous Medications   ACETAMINOPHEN (TYLENOL) 500 MG TABLET    Take 1,000 mg by mouth  every 6 (six) hours as needed for mild pain.   ALPRAZOLAM (XANAX) 0.25 MG TABLET    Take 1 tablet (0.25 mg total) by mouth 3 (three) times daily.   DIGOXIN (LANOXIN) 0.125 MG TABLET    Take 1 tablet (0.125 mg total) by mouth daily.   DILTIAZEM (CARDIZEM CD) 180 MG 24 HR CAPSULE    Take 1 capsule (180 mg total) by mouth daily.   FAMOTIDINE (PEPCID) 20 MG TABLET    Take 20 mg by mouth daily.    INSULIN NPH HUMAN (HUMULIN N,NOVOLIN N) 100 UNIT/ML INJECTION    Inject 0.18 mLs (18 Units total) into the skin 2 (two) times daily at 8 am and 10 pm.   KLOR-CON 10 10 MEQ TABLET    Take 10 mEq by mouth daily.    LORATADINE (CLARITIN) 10 MG TABLET    Take 10 mg by mouth daily as needed for allergies.    METFORMIN (GLUCOPHAGE) 500 MG TABLET    Take 1 tablet (500 mg total) by mouth 2 (two) times daily with a meal.   PAROXETINE (PAXIL) 40 MG TABLET    Take 1 tablet (40 mg total) by mouth daily.   RISPERIDONE (RISPERDAL) 2 MG TABLET    Take 1 tablet (2 mg total) by mouth at bedtime.   ROSUVASTATIN (CRESTOR) 10 MG TABLET    Take 10 mg by mouth every evening.   TRAZODONE (DESYREL) 150 MG TABLET    Take 75 mg by mouth at bedtime.  Modified Medications   No medications on file  Discontinued Medications   No medications on file    Review of Systems  Unable to perform ROS: Other (expressive aphasia; psych d/o)    Vitals:   10/22/15 1435  BP: (!) 148/82  Pulse: 70  Resp: 18  Temp: (!) 96.7 F (35.9 C)  TempSrc: Oral  SpO2: 97%  Weight: 174 lb 9.6 oz (79.2 kg)  Height: _0  (1.753 m)   Body mass index is 25.78 kg/m.  Physical Exam  Constitutional: She appears well-developed and well-nourished. No distress.  Neurological: She is alert.  Skin: Skin is warm and dry. No rash noted.  Psychiatric: She has a normal mood and affect. Her behavior is normal.     Labs reviewed: Admission on 09/17/2015, Discharged on 09/26/2015  No results displayed because visit has over 200 results.  CBC Latest Ref  Rng & Units 09/26/2015 09/25/2015 09/17/2015  WBC 4.0 - 10.5 K/uL 9.1 10.0 10.2  Hemoglobin 12.0 - 15.0 g/dL 13.5 12.6 13.9  Hematocrit 36.0 - 46.0 % 40.8 38.9 42.0  Platelets 150 - 400 K/uL 175 166 255   CMP Latest Ref Rng & Units 09/26/2015 09/25/2015 09/17/2015  Glucose 65 - 99 mg/dL  101(H) 179(H) 266(H)  BUN 6 - 20 mg/dL _0 Creatinine 0.44 - 1.00 mg/dL 0.61 0.70 0.89  Sodium 135 - 145 mmol/L 139 137 137  Potassium 3.5 - 5.1 mmol/L 4.2 3.9 3.9  Chloride 101 - 111 mmol/L 104 104 105  CO2 22 - 32 mmol/L _1 Calcium 8.9 - 10.3 mg/dL 10.3 10.3 10.2  Total Protein 6.5 - 8.1 g/dL - - 6.7  Total Bilirubin 0.3 - 1.2 mg/dL - - 0.5  Alkaline Phos 38 - 126 U/L - - 56  AST 15 - 41 U/L - - 19  ALT 14 - 54 U/L - - 15   Lipid Panel     Component Value Date/Time   CHOL 127 09/11/2013 1101   TRIG 45 09/11/2013 1101   HDL 59 09/11/2013 1101   CHOLHDL 2.2 09/11/2013 1101   VLDL 9 09/11/2013 1101   LDLCALC 59 09/11/2013 1101   Lab Results  Component Value Date   HGBA1C 7.9 (H) 09/19/2015     Admission on 08/15/2015, Discharged on 08/18/2015  Component Date Value Ref Range Status  . Sodium 08/15/2015 136  135 - 145 mmol/L Final  . Potassium 08/15/2015 3.5  3.5 - 5.1 mmol/L Final  . Chloride 08/15/2015 106  101 - 111 mmol/L Final  . CO2 08/15/2015 24  22 - 32 mmol/L Final  . Glucose, Bld 08/15/2015 145* 65 - 99 mg/dL Final  . BUN 08/15/2015 6  6 - 20 mg/dL Final  . Creatinine, Ser 08/15/2015 0.72  0.44 - 1.00 mg/dL Final  . Calcium 08/15/2015 9.6  8.9 - 10.3 mg/dL Final  . Total Protein 08/15/2015 7.1  6.5 - 8.1 g/dL Final  . Albumin 08/15/2015 3.7  3.5 - 5.0 g/dL Final  . AST 08/15/2015 15  15 - 41 U/L Final  . ALT 08/15/2015 13* 14 - 54 U/L Final  . Alkaline Phosphatase 08/15/2015 58  38 - 126 U/L Final  . Total Bilirubin 08/15/2015 0.5  0.3 - 1.2 mg/dL Final  . GFR calc non Af Amer 08/15/2015 >60  >60 mL/min Final  . GFR calc Af Amer 08/15/2015 >60  >60 mL/min Final    Comment: (NOTE) The eGFR has been calculated using the CKD EPI equation. This calculation has not been validated in all clinical situations. eGFR's persistently <60 mL/min signify possible Chronic Kidney Disease.   . Anion gap 08/15/2015 6  5 - 15 Final  . WBC 08/15/2015 7.7  4.0 - 10.5 K/uL Final  . RBC 08/15/2015 4.29  3.87 - 5.11 MIL/uL Final  . Hemoglobin 08/15/2015 12.8  12.0 - 15.0 g/dL Final  . HCT 08/15/2015 38.3  36.0 - 46.0 % Final  . MCV 08/15/2015 89.3  78.0 - 100.0 fL Final  . MCH 08/15/2015 29.8  26.0 - 34.0 pg Final  . MCHC 08/15/2015 33.4  30.0 - 36.0 g/dL Final  . RDW 08/15/2015 12.3  11.5 - 15.5 % Final  . Platelets 08/15/2015 233  150 - 400 K/uL Final  . Color, Urine 08/15/2015 YELLOW  YELLOW Final  . APPearance 08/15/2015 CLEAR  CLEAR Final  . Specific Gravity, Urine 08/15/2015 1.010  1.005 - 1.030 Final  . pH 08/15/2015 7.5  5.0 - 8.0 Final  . Glucose, UA 08/15/2015 NEGATIVE  NEGATIVE mg/dL Final  . Hgb urine dipstick 08/15/2015 SMALL* NEGATIVE Final  . Bilirubin Urine 08/15/2015 NEGATIVE  NEGATIVE Final  . Ketones, ur 08/15/2015 NEGATIVE  NEGATIVE mg/dL Final  . Protein, ur  08/15/2015 NEGATIVE  NEGATIVE mg/dL Final  . Nitrite 08/15/2015 NEGATIVE  NEGATIVE Final  . Leukocytes, UA 08/15/2015 TRACE* NEGATIVE Final  . Alcohol, Ethyl (B) 08/15/2015 <5  <5 mg/dL Final   Comment:        LOWEST DETECTABLE LIMIT FOR SERUM ALCOHOL IS 5 mg/dL FOR MEDICAL PURPOSES ONLY   . Opiates 08/15/2015 NONE DETECTED  NONE DETECTED Final  . Cocaine 08/15/2015 NONE DETECTED  NONE DETECTED Final  . Benzodiazepines 08/15/2015 POSITIVE* NONE DETECTED Final  . Amphetamines 08/15/2015 NONE DETECTED  NONE DETECTED Final  . Tetrahydrocannabinol 08/15/2015 NONE DETECTED  NONE DETECTED Final  . Barbiturates 08/15/2015 NONE DETECTED  NONE DETECTED Final   Comment:        DRUG SCREEN FOR MEDICAL PURPOSES ONLY.  IF CONFIRMATION IS NEEDED FOR ANY PURPOSE, NOTIFY LAB WITHIN 5 DAYS.          LOWEST DETECTABLE LIMITS FOR URINE DRUG SCREEN Drug Class       Cutoff (ng/mL) Amphetamine      1000 Barbiturate      200 Benzodiazepine   628 Tricyclics       366 Opiates          300 Cocaine          300 THC              50   . Digoxin Level 08/15/2015 0.4* 0.8 - 2.0 ng/mL Final  . Squamous Epithelial / LPF 08/15/2015 0-5* NONE SEEN Final  . WBC, UA 08/15/2015 0-5  0 - 5 WBC/hpf Final  . RBC / HPF 08/15/2015 0-5  0 - 5 RBC/hpf Final  . Bacteria, UA 08/15/2015 RARE* NONE SEEN Final  . Glucose-Capillary 08/15/2015 226* 65 - 99 mg/dL Final  . Comment 1 08/15/2015 Notify RN   Final  . Comment 2 08/15/2015 Document in Chart   Final  . Glucose-Capillary 08/16/2015 161* 65 - 99 mg/dL Final  . Comment 1 08/16/2015 Notify RN   Final  . Comment 2 08/16/2015 Document in Chart   Final  . Glucose-Capillary 08/16/2015 152* 65 - 99 mg/dL Final  . Glucose-Capillary 08/16/2015 350* 65 - 99 mg/dL Final  . Glucose-Capillary 08/16/2015 270* 65 - 99 mg/dL Final  . Glucose-Capillary 08/17/2015 139* 65 - 99 mg/dL Final  . Glucose-Capillary 08/17/2015 320* 65 - 99 mg/dL Final  . Glucose-Capillary 08/18/2015 109* 65 - 99 mg/dL Final  . Glucose-Capillary 08/18/2015 206* 65 - 99 mg/dL Final  . Comment 1 08/18/2015 Document in Chart   Final    Mr Brain W Wo Contrast  Result Date: 09/24/2015 CLINICAL DATA:  65 y/o F; history of Rosai-Dorfman disease status post resection of an occipital mass lesion presenting with new onset weakness involving the right leg for 2 days. EXAM: MRI HEAD WITHOUT AND WITH CONTRAST TECHNIQUE: Multiplanar, multiecho pulse sequences of the brain and surrounding structures were obtained without and with intravenous contrast. CONTRAST:  53m MULTIHANCE GADOBENATE DIMEGLUMINE 529 MG/ML IV SOLN COMPARISON:  MRI of the brain dated 09/17/2015. CT of the head dated 09/19/2015. FINDINGS: Brain: No diffusion restriction to suggest acute infarct. There are scattered foci of susceptibility  hypointensity within the bilateral parietal lobes, bilateral frontal lobes, left anterior temporal lobe, and left cerebellar hemisphere stable from the prior MRI likely representing hemosiderin deposition. Postsurgical changes related to occipital surgery with minimal stable hemosiderin staining. There is stable moderate brain parenchymal atrophy and mild chronic microvascular ischemic changes. Extra-axial space: No hydrocephalus. There is dural thickening and enhancement  overlying bilateral Meckel's caves and the cavernous sinuses, right greater than left, that is stable. Right-sided dural enhancement over the cavernous sinus extends into the right orbital apex. There are stable masslike foci of enhancement within the left middle cranial fossa along the sphenoid wing and additional focus over the lateral aspect of the left middle cranial fossa (series 12, image 23 and 19). There is stable subacute subarachnoid hemorrhage over the left paramedian frontal parietal convexity. There is a moderate increase of enhancement within the subarachnoid space in the region of hemorrhage, series 13, image 15. No new hemorrhage is identified. Proximal intracranial flow voids are maintained. Partially empty sella turcica and enlargement of Meckel's caves bilaterally. Other: Large left and small right maxillary sinus mucous retention cysts. No abnormal signal of the mastoid air cells. Slight optic disc prominence may represent papilledema. Unchanged minimal postsurgical changes in the occipital region from prior surgery. The calvarium is otherwise unremarkable. IMPRESSION: 1. Stable dural thickening and foci of masslike enhancement overlying the bilateral cavernous sinuses/Meckel's caves, within the right optic canal, and overlying the left middle cranial fossa consistent patient's history of Rosai-Dorfman disease. 2. Increasing subarachnoid enhancement in the region of hemorrhage in the left paramedian frontal and parietal lobes  may be reactive due to the presence of blood products, but infection/inflammation or proliferation of histiocytosis is possible and follow-up is recommended. 3. No evidence of acute infarct, new intracranial hemorrhage, or new focal mass effect. 4. Stable partial empty sella turcica, and large Meckel's caves, and possible papilledema can be seen with increased intracranial pressure, such as pseudotumor cerebri. Electronically Signed   By: Kristine Garbe M.D.   On: 09/24/2015 22:33     Assessment/Plan   ICD-9-CM ICD-10-CM   1. SAH (subarachnoid hemorrhage) (HCC) 430 I60.9   2. Essential hypertension 401.9 I10   3. Diabetes mellitus type 2 in nonobese (HCC) 250.00 E11.9   4. PAF (paroxysmal atrial fibrillation) (HCC) 427.31 I48.0   5. Schizophrenia, unspecified type ([place])  F20.9   6. Blind in both eyes 369.00 H54.0   7. Depression 311 F32.9   8. Dyslipidemia 272.4 E78.5   9. Insomnia due to mental disorder 300.9 F48.9    327.02 F51.05    Follow up on SPEP/UPEP  May need H/O referral to further eval lytic lesions seen on imaging at hospital  Patient is being discharged with home health services:  Nursing, PT/OT   Patient is being discharged with the following durable medical equipment:  3-in-1 commode; standard w/c (Pt suffers from [facility] with right hemiparesis____ which impairs ability to perform ADLs (toileting, dressing, grooming, bathing) in home. A _cane/crutch/walker____will not resolve the issues with performing ADLs. Wheelchair will allow pt to safely perform daily activities. Pt can safely propel wheelchair in home or has caregiver who can provide assistance)  Patient has been advised to f/u with their PCP in 1-2 weeks to bring them up to date on their rehab stay.  They were provided with a 30 day supply of scripts for prescription medications and refills must be obtained from their PCP.  TIME SPENT (MINUTES): [place]. Perlie Gold  [facility] [address] [place], [place] [address] [phone] Cell (Monday-Friday 8 AM - 5 PM) [phone] After 5 PM and follow prompts

## 2015-11-11 ENCOUNTER — Ambulatory Visit (INDEPENDENT_AMBULATORY_CARE_PROVIDER_SITE_OTHER): Payer: BLUE CROSS/BLUE SHIELD | Admitting: Cardiovascular Disease

## 2015-11-11 ENCOUNTER — Encounter: Payer: Self-pay | Admitting: Cardiovascular Disease

## 2015-11-11 VITALS — BP 151/87 | HR 86 | Ht 68.0 in | Wt 175.6 lb

## 2015-11-11 DIAGNOSIS — E785 Hyperlipidemia, unspecified: Secondary | ICD-10-CM | POA: Diagnosis not present

## 2015-11-11 DIAGNOSIS — E119 Type 2 diabetes mellitus without complications: Secondary | ICD-10-CM

## 2015-11-11 DIAGNOSIS — I10 Essential (primary) hypertension: Secondary | ICD-10-CM | POA: Diagnosis not present

## 2015-11-11 DIAGNOSIS — I480 Paroxysmal atrial fibrillation: Secondary | ICD-10-CM | POA: Diagnosis not present

## 2015-11-11 NOTE — Patient Instructions (Signed)
Dr Croitoru recommends that you schedule a follow-up appointment in 1 year. You will receive a reminder letter in the mail two months in advance. If you don't receive a letter, please call our office to schedule the follow-up appointment.  If you need a refill on your cardiac medications before your next appointment, please call your pharmacy. 

## 2015-11-11 NOTE — Progress Notes (Signed)
Cardiology Office Note    Date:  11/11/2015   ID:  Rachel, [name] 1950-05-19, MRN [id number]  PCP:  Maggie Font, MD  Cardiologist:   Sanda Klein, MD   Chief Complaint  Patient presents with  . Follow-up    Pt states no Sx.     History of Present Illness:  Rachel [name] is a 65 y.o. female with a history of paroxysmal atrial fibrillation, hypertension, hyperlipidemia and diabetes mellitus type 2 on insulin, intracranial lymphoproliferative disorder and schizophrenia presents for routine follow-up. Roughly 2 months ago she was hospitalized for a left frontoparietal subarachnoid hemorrhage, presumed to be posttraumatic. She has a history of previous resection of an occipital mass and Rosai-Dorfman's disease. She had to spend a short while a nursing home and is still receiving therapy, but is currently able to walk without the use of support devices. During her hospital stay she did not have any arrhythmia. She has not had palpitations and denies shortness of breath. She did have one episode of brief squeezing in her chest that occurred after being discharged from the hospital.  She denies syncope, leg edema, orthopnea, PND or any new neurological events since getting home. She remains extremely sedentary, even more so following her last setback.  Past Medical History:  Diagnosis Date  . Blindness   . Diabetes mellitus type I ([place])   . GERD (gastroesophageal reflux disease)   . Hyperlipidemia   . Paroxysmal atrial fibrillation (HCC)   . Schizophrenia ([place])   . Systemic hypertension   . Vertigo     Past Surgical History:  Procedure Laterality Date  . BRAIN TUMOR EXCISION    . NM MYOCAR PERF WALL MOTION  02/01/2007   no significant ischemia  . US ECHOCARDIOGRAPHY  12/20/2003   mild mitral annular ca+,mild MR,TR,PI,AOV mildly sclerotic    Current Medications: Outpatient Medications Prior to Visit  Medication Sig Dispense Refill  . acetaminophen (TYLENOL)  500 MG tablet Take 1,000 mg by mouth every 6 (six) hours as needed for mild pain.    Marland Kitchen ALPRAZolam (XANAX) 0.25 MG tablet Take 1 tablet (0.25 mg total) by mouth 3 (three) times daily. 90 tablet 0  . digoxin (LANOXIN) 0.125 MG tablet Take 1 tablet (0.125 mg total) by mouth daily. 90 tablet 3  . diltiazem (CARDIZEM CD) 180 MG 24 hr capsule Take 1 capsule (180 mg total) by mouth daily. 90 capsule 3  . famotidine (PEPCID) 20 MG tablet Take 20 mg by mouth daily.     . insulin NPH Human (HUMULIN N,NOVOLIN N) 100 UNIT/ML injection Inject 0.18 mLs (18 Units total) into the skin 2 (two) times daily at 8 am and 10 pm. 10 mL 11  . KLOR-CON 10 10 MEQ tablet Take 10 mEq by mouth daily.     Marland Kitchen loratadine (CLARITIN) 10 MG tablet Take 10 mg by mouth daily as needed for allergies.   1  . metFORMIN (GLUCOPHAGE) 500 MG tablet Take 1 tablet (500 mg total) by mouth 2 (two) times daily with a meal. 60 tablet 2  . PARoxetine (PAXIL) 40 MG tablet Take 1 tablet (40 mg total) by mouth daily. 90 tablet 2  . risperiDONE (RISPERDAL) 2 MG tablet Take 1 tablet (2 mg total) by mouth at bedtime. 90 tablet 2  . rosuvastatin (CRESTOR) 10 MG tablet Take 10 mg by mouth every evening.    . traZODone (DESYREL) 150 MG tablet Take 75 mg by mouth at bedtime.  No facility-administered medications prior to visit.      Allergies:   Sulfa antibiotics; Ibuprofen; and Shellfish allergy   Social History   Social History  . Marital status: Married    Spouse name: N/A  . Number of children: N/A  . Years of education: N/A   Social History Main Topics  . Smoking status: Never Smoker  . Smokeless tobacco: Never Used  . Alcohol use No  . Drug use: No  . Sexual activity: Not Asked   Other Topics Concern  . None   Social History Narrative  . None      ROS:   Please see the history of present illness.    ROS All other systems reviewed and are negative.   PHYSICAL EXAM:   VS:  BP (!) 151/87   Pulse 86   Ht 5\' 8"  (1.727 m)    Wt 79.7 kg (175 lb 9.6 oz)   BMI 26.70 kg/m    GEN: Well nourished, well developed, in no acute distress  HEENT: normal  Neck: no JVD, carotid bruits, or masses Cardiac: RRR; no murmurs, rubs, or gallops,no edema  Respiratory:  clear to auscultation bilaterally, normal work of breathing GI: soft, nontender, nondistended, + BS MS: no deformity or atrophy  Skin: warm and dry, no rash Neuro:  Alert and Oriented x 3, Strength and sensation are intact Psych: euthymic mood, full affect  Wt Readings from Last 3 Encounters:  11/11/15 79.7 kg (175 lb 9.6 oz)  10/22/15 79.2 kg (174 lb 9.6 oz)  10/01/15 77.1 kg (170 lb)      Studies/Labs Reviewed:   EKG:  EKG is not ordered today.  The ekg ordered August 10 demonstrates normal sinus rhythm, RSR prime pattern in leads V1-V2, otherwise normal tracing  Recent Labs: 09/17/2015: ALT 15 09/26/2015: BUN 7; Creatinine, Ser 0.61; Hemoglobin 13.5; Platelets 175; Potassium 4.2; Sodium 139   Lipid Panel    Component Value Date/Time   CHOL 127 09/11/2013 1101   TRIG 45 09/11/2013 1101   HDL 59 09/11/2013 1101   CHOLHDL 2.2 09/11/2013 1101   VLDL 9 09/11/2013 1101   LDLCALC 59 09/11/2013 1101    Additional studies/ records that were reviewed today include:  Records from recent hospitalization, [facility]    ASSESSMENT:    No diagnosis found.   PLAN:  In order of problems listed above:  1. Afib: No recent documentation of events and no palpitations. The risk of anticoagulation is clearly not justified and anticoagulation is contraindicated due to recent subarachnoid hemorrhage. 2. HTN: Slightly elevated on arrival to the office today, but normal at her other office appointments and at [facility] nursing home area no adjustments made to her medications 3. HLP: On statin. Most recent lipid profile shows values at target 4. DM: Insulin requiring, most recent hemoglobin A1c elevated at 7.9%  Suspect her overall prognosis  will be primarily limited by her intracranial lymphoproliferative disorder, rather than cardiac issues  Medication Adjustments/Labs and Tests Ordered: Current medicines are reviewed at length with the patient today.  Concerns regarding medicines are outlined above.  Medication changes, Labs and Tests ordered today are listed in the Patient Instructions below. Patient Instructions  Dr Sallyanne Kuster recommends that you schedule a follow-up appointment in 1 year. You will receive a reminder letter in the mail two months in advance. If you don't receive a letter, please call our office to schedule the follow-up appointment.  If you need a refill on your  cardiac medications before your next appointment, please call your pharmacy.    Signed, Sanda Klein, MD  11/11/2015 9:37 AM    [address], [place], [place]  [address] Phone: [phone]; Fax: [phone]

## 2015-11-18 ENCOUNTER — Other Ambulatory Visit: Payer: Self-pay | Admitting: *Deleted

## 2015-11-18 MED ORDER — ROSUVASTATIN CALCIUM 10 MG PO TABS: 10.0000 mg | ORAL_TABLET | Freq: Every evening | ORAL | 3 refills | Status: DC

## 2015-11-19 ENCOUNTER — Other Ambulatory Visit: Payer: Self-pay

## 2015-11-19 MED ORDER — DILTIAZEM HCL ER COATED BEADS 180 MG PO CP24: 180.0000 mg | ORAL_CAPSULE | Freq: Every day | ORAL | 3 refills | Status: DC

## 2015-11-19 MED ORDER — DIGOXIN 125 MCG PO TABS: 0.1250 mg | ORAL_TABLET | Freq: Every day | ORAL | 3 refills | Status: DC

## 2015-11-20 ENCOUNTER — Other Ambulatory Visit: Payer: Self-pay

## 2015-12-18 ENCOUNTER — Encounter: Payer: Self-pay | Admitting: Neurology

## 2015-12-18 ENCOUNTER — Ambulatory Visit (INDEPENDENT_AMBULATORY_CARE_PROVIDER_SITE_OTHER): Payer: BLUE CROSS/BLUE SHIELD | Admitting: Neurology

## 2015-12-18 VITALS — BP 133/76 | HR 78 | Ht 68.0 in | Wt 176.8 lb

## 2015-12-18 DIAGNOSIS — I6990 Unspecified sequelae of unspecified cerebrovascular disease: Secondary | ICD-10-CM | POA: Diagnosis not present

## 2015-12-18 DIAGNOSIS — I609 Nontraumatic subarachnoid hemorrhage, unspecified: Secondary | ICD-10-CM

## 2015-12-18 MED ORDER — ASPIRIN EC 81 MG PO TBEC: 81.0000 mg | DELAYED_RELEASE_TABLET | Freq: Every day | ORAL | Status: AC

## 2015-12-18 NOTE — Progress Notes (Signed)
[facility] [address] [place]. [place] [address] [phone]       OFFICE FOLLOW-UP NOTE  Rachel [name] Date of Birth:  Jul 08, 1950 Medical Record Number:  [id number]   HPI: Rachel [name] is a 65 year old pleasant African-American lady seen today for first office follow-up visit following hospital admission for right leg weakness in August 2017. Rachel Loven Richardsonis an 65 y.o.femalewith a history of Rosai-Dorfman disease, resection of an occipital mass lesion, hypertension, paroxysmal atrial fibrillation, hyperlipidemia, schizophrenia and diabetes mellitus, presenting with new onset weakness involving right leg for 2 days. She has no previous history of stroke no TIA. She's been taking aspirin 81 mg per day. MRI of the brain showed findings indicative of acute to subacute left frontoparietal subarachnoid hemorrhage, and addition to enhancing mass lesions in the left middle cranial fossa and left lateral temporal region. Plaque-like enhancement was noted involving anterior middle cranial fossa on the right, as well as an enhancing lesion involving right optic nerve. Postsurgical changes in the occipital region noted. Patient has had no change in speech and no symptoms involving the right upper extremity. She has chronic right facial weakness which is unchanged. MRI showedL frontoparietal medial SAH. Her home ASA discontinued. BP and glucose were controlled. PT/OT recommended CIR first and then changed to SNF. On 09/23/15 found to have right arm weakness, husband stated that she had on and off right arm weakness. EEG negative. Repeat MRI no change. Continued PT/OT. Transfer to SNF for continued rehab.She was seen on consultation by neurosurgery who felt that the patient subarachnoid hemorrhage was probably related to unrecognized, and not due to aneurysm or AVM and hence further vascular imaging was not done. Patient remained stable and has been discharged from  rehabilitation and has even finish home physical and occupation therapy. She is able to walk without assistance but feels her legs often drags on her balance is not the same. She's had a few minor falls but fortunately no major injuries. She has been advised to use a cane but she refuses to do so. Follow-up MRI scan of the brain showed stable appearance of the   subarachnoid hemorrhage as well as the brain lesions. She has a known history of long-standing Rosai Dorfman  disease and had undergone occipital craniotomy for removal of one such lesion and has had stable appearance of follow-up meningeal lesions in the left middle cranial fossa   ROS:   14 system review of systems is positive for  excessive thirst, memory loss, gait imbalance, hallucinations and all other systems negative PMH:  Past Medical History:  Diagnosis Date  . Blindness   . Diabetes mellitus type I ([place])   . GERD (gastroesophageal reflux disease)   . Hyperlipidemia   . Paroxysmal atrial fibrillation ([facility])   . Schizophrenia ([place])   . Systemic hypertension   . Vertigo     Social History:  Social History   Social History  . Marital status: Married    Spouse name: N/A  . Number of children: N/A  . Years of education: N/A   Occupational History  . Not on file.   Social History Main Topics  . Smoking status: Never Smoker  . Smokeless tobacco: Never Used  . Alcohol use No  . Drug use: No  . Sexual activity: Not on file   Other Topics Concern  . Not on file   Social History Narrative  . No narrative on file    Medications:   Current  Outpatient Prescriptions on File Prior to Visit  Medication Sig Dispense Refill  . acetaminophen (TYLENOL) 500 MG tablet Take 1,000 mg by mouth every 6 (six) hours as needed for mild pain.    Marland Kitchen ALPRAZolam (XANAX) 0.25 MG tablet Take 1 tablet (0.25 mg total) by mouth 3 (three) times daily. 90 tablet 0  . digoxin (LANOXIN) 0.125 MG tablet Take 1 tablet (0.125 mg total) by mouth  daily. 90 tablet 3  . diltiazem (CARDIZEM CD) 180 MG 24 hr capsule Take 1 capsule (180 mg total) by mouth daily. 90 capsule 3  . famotidine (PEPCID) 20 MG tablet Take 20 mg by mouth daily.     . insulin NPH Human (HUMULIN N,NOVOLIN N) 100 UNIT/ML injection Inject 0.18 mLs (18 Units total) into the skin 2 (two) times daily at 8 am and 10 pm. 10 mL 11  . KLOR-CON 10 10 MEQ tablet Take 10 mEq by mouth daily.     Marland Kitchen loratadine (CLARITIN) 10 MG tablet Take 10 mg by mouth daily as needed for allergies.   1  . metFORMIN (GLUCOPHAGE) 500 MG tablet Take 1 tablet (500 mg total) by mouth 2 (two) times daily with a meal. 60 tablet 2  . PARoxetine (PAXIL) 40 MG tablet Take 1 tablet (40 mg total) by mouth daily. 90 tablet 2  . risperiDONE (RISPERDAL) 2 MG tablet Take 1 tablet (2 mg total) by mouth at bedtime. 90 tablet 2  . rosuvastatin (CRESTOR) 10 MG tablet Take 1 tablet (10 mg total) by mouth every evening. 90 tablet 3  . traZODone (DESYREL) 150 MG tablet Take 75 mg by mouth at bedtime.     No current facility-administered medications on file prior to visit.     Allergies:   Allergies  Allergen Reactions  . Sulfa Antibiotics Anaphylaxis  . Ibuprofen Other (See Comments)    unknown  . Shellfish Allergy Swelling    Mouth    Physical Exam General: well developed, well nourished, seated, in no evident distress Head: head normocephalic and atraumatic.  Neck: supple with no carotid or supraclavicular bruits Cardiovascular: regular rate and rhythm, no murmurs Musculoskeletal: no deformity Skin:  no rash/petichiae Vascular:  Normal pulses all extremities Vitals:   12/18/15 1148  BP: 133/76  Pulse: 78   Neurologic Exam Mental Status: Awake and fully alert. Oriented to place and time. Recent and remote memory intact. Attention span, concentration and fund of knowledge appropriate. Mood and affect appropriate.  Cranial Nerves: Fundoscopic exam reveals sharp disc margins. Pupils equal, briskly  reactive to light. Extraocular movements full without nystagmus. Visual fields full to confrontation. Hearing intact. Facial sensation intact. Face, tongue, palate moves normally and symmetrically.  Motor: Normal bulk and tone. Normal strength in all tested extremity muscles. Sensory.: intact to touch ,pinprick .position and vibratory sensation.  Coordination: Rapid alternating movements normal in all extremities. Finger-to-nose and heel-to-shin performed accurately bilaterally. Gait and Station: Arises from chair without difficulty. Stance is normal. Gait demonstrates Slight imbalance and dragging of the right leg. Unsteady while standing on right foot unsupported.. unable to heel, toe and tandem walk without difficulty.  Reflexes: 1+ and symmetric. Toes downgoing.   NIHSS  0 Modified Rankin 2   ASSESSMENT: 37 year African-American lady with small left medial frontal subarachnoid hemorrhage possibly related to trauma of unclear etiology. Long-standing history of Rosai Dorfman disease which appears stable. She still has mild gait and balance difficulties but is otherwise doing well    PLAN: I had a long  discussion with the patient and husband regarding her recent small subarachnoid hemorrhage possibly posttraumatic and recommend she start taking aspirin 81 mg daily. I also discussed fall and safety precautions and referral for outpatient physical and occupation therapy for balance and safety. Continue conservative follow-up for her Flavia Shipper disease which appears stable She will return for follow-up in the future in 6 months or call earlier if necessary. Greater than 50% of time during this 25 minute visit was spent on counseling,explanation of diagnosis, planning of further management, discussion with patient and family and coordination of care Antony Contras, MD  [facility] [address] [place], [place] [address]  Phone [phone] Fax  [phone] Note: This document was prepared with digital dictation and possible smart phrase technology. Any transcriptional errors that result from this process are unintentional

## 2015-12-18 NOTE — Patient Instructions (Addendum)
Fall Prevention in the Home  Falls can cause injuries and can affect people from all age groups. There are many simple things that you can do to make your home safe and to help prevent falls. WHAT CAN I DO ON THE OUTSIDE OF MY HOME?  Regularly repair the edges of walkways and driveways and fix any cracks.  Remove high doorway thresholds.  Trim any shrubbery on the main path into your home.  Use bright outdoor lighting.  Clear walkways of debris and clutter, including tools and rocks.  Regularly check that handrails are securely fastened and in good repair. Both sides of any steps should have handrails.  Install guardrails along the edges of any raised decks or porches.  Have leaves, snow, and ice cleared regularly.  Use sand or salt on walkways during winter months.  In the garage, clean up any spills right away, including grease or oil spills. WHAT CAN I DO IN THE BATHROOM?  Use night lights.  Install grab bars by the toilet and in the tub and shower. Do not use towel bars as grab bars.  Use non-skid mats or decals on the floor of the tub or shower.  If you need to sit down while you are in the shower, use a plastic, non-slip stool..  Keep the floor dry. Immediately clean up any water that spills on the floor.  Remove soap buildup in the tub or shower on a regular basis.  Attach bath mats securely with double-sided non-slip rug tape.  Remove throw rugs and other tripping hazards from the floor. WHAT CAN I DO IN THE BEDROOM?  Use night lights.  Make sure that a bedside light is easy to reach.  Do not use oversized bedding that drapes onto the floor.  Have a firm chair that has side arms to use for getting dressed.  Remove throw rugs and other tripping hazards from the floor. WHAT CAN I DO IN THE KITCHEN?   Clean up any spills right away.  Avoid walking on wet floors.  Place frequently used items in easy-to-reach places.  If you need to reach for something  above you, use a sturdy step stool that has a grab bar.  Keep electrical cables out of the way.  Do not use floor polish or wax that makes floors slippery. If you have to use wax, make sure that it is non-skid floor wax.  Remove throw rugs and other tripping hazards from the floor. WHAT CAN I DO IN THE STAIRWAYS?  Do not leave any items on the stairs.  Make sure that there are handrails on both sides of the stairs. Fix handrails that are broken or loose. Make sure that handrails are as long as the stairways.  Check any carpeting to make sure that it is firmly attached to the stairs. Fix any carpet that is loose or worn.  Avoid having throw rugs at the top or bottom of stairways, or secure the rugs with carpet tape to prevent them from moving.  Make sure that you have a light switch at the top of the stairs and the bottom of the stairs. If you do not have them, have them installed. WHAT ARE SOME OTHER FALL PREVENTION TIPS?  Wear closed-toe shoes that fit well and support your feet. Wear shoes that have rubber soles or low heels.  When you use a stepladder, make sure that it is completely opened and that the sides are firmly locked. Have someone hold the ladder while you   are using it. Do not climb a closed stepladder.  Add color or contrast paint or tape to grab bars and handrails in your home. Place contrasting color strips on the first and last steps.  Use mobility aids as needed, such as canes, walkers, scooters, and crutches.  Turn on lights if it is dark. Replace any light bulbs that burn out.  Set up furniture so that there are clear paths. Keep the furniture in the same spot.  Fix any uneven floor surfaces.  Choose a carpet design that does not hide the edge of steps of a stairway.  Be aware of any and all pets.  Review your medicines with your healthcare provider. Some medicines can cause dizziness or changes in blood pressure, which increase your risk of falling. Talk  with your health care provider about other ways that you can decrease your risk of falls. This may include working with a physical therapist or trainer to improve your strength, balance, and endurance.   This information is not intended to replace advice given to you by your health care provider. Make sure you discuss any questions you have with your health care provider.   Document Released: 01/16/2002 Document Revised: 06/12/2014 Document Reviewed: 03/02/2014 Elsevier Interactive Patient Education 2016 [employer]  

## 2015-12-19 ENCOUNTER — Ambulatory Visit (INDEPENDENT_AMBULATORY_CARE_PROVIDER_SITE_OTHER): Payer: BLUE CROSS/BLUE SHIELD | Admitting: Psychiatry

## 2015-12-19 ENCOUNTER — Encounter (HOSPITAL_COMMUNITY): Payer: Self-pay | Admitting: Psychiatry

## 2015-12-19 VITALS — BP 134/71 | HR 82 | Ht 68.0 in | Wt 174.4 lb

## 2015-12-19 DIAGNOSIS — Z888 Allergy status to other drugs, medicaments and biological substances status: Secondary | ICD-10-CM | POA: Diagnosis not present

## 2015-12-19 DIAGNOSIS — Z91013 Allergy to seafood: Secondary | ICD-10-CM

## 2015-12-19 DIAGNOSIS — F200 Paranoid schizophrenia: Secondary | ICD-10-CM

## 2015-12-19 DIAGNOSIS — Z882 Allergy status to sulfonamides status: Secondary | ICD-10-CM

## 2015-12-19 DIAGNOSIS — Z79899 Other long term (current) drug therapy: Secondary | ICD-10-CM

## 2015-12-19 MED ORDER — RISPERIDONE 2 MG PO TABS: 2.0000 mg | ORAL_TABLET | Freq: Every day | ORAL | 2 refills | Status: DC

## 2015-12-19 MED ORDER — TRAZODONE HCL 100 MG PO TABS: 100.0000 mg | ORAL_TABLET | Freq: Every day | ORAL | 2 refills | Status: DC

## 2015-12-19 MED ORDER — ALPRAZOLAM 0.25 MG PO TABS: 0.2500 mg | ORAL_TABLET | Freq: Three times a day (TID) | ORAL | 2 refills | Status: DC

## 2015-12-19 MED ORDER — PAROXETINE HCL 40 MG PO TABS: 40.0000 mg | ORAL_TABLET | Freq: Every day | ORAL | 2 refills | Status: DC

## 2015-12-19 NOTE — Progress Notes (Signed)
Patient ID: Rachel [name], female   DOB: [birth year], 65 y.o.   MRN: [id number] Patient ID: Rachel [name], female   DOB: 12-28-1950, 65 y.o.   MRN: [id number] Patient ID: Rachel [name], female   DOB: Jan 14, 1951, 65 y.o.   MRN: [id number] Patient ID: Rachel [name], female   DOB: 1950-02-23, 65 y.o.   MRN: [id number] Patient ID: Rachel [name], female   DOB: 04/26/1950, 65 y.o.   MRN: [id number] Patient ID: Rachel [name], female   DOB: 26-Jun-1950, 65 y.o.   MRN: [id number] Patient ID: Rachel [name], female   DOB: November 12, 1950, 65 y.o.   MRN: [id number] Patient ID: Rachel [name], female   DOB: 06/28/1950, 65 y.o.   MRN: [id number] Patient ID: Rachel [name], female   DOB: 08/03/1950, 65 y.o.   MRN: [id number] Patient ID: Rachel [name], female   DOB: 09/11/1950, 65 y.o.   MRN: [id number] Patient ID: Rachel [name], female   DOB: 04/25/1950, 66 y.o.   MRN: [id number] Patient ID: Rachel [name], female   DOB: November 18, 1950, 65 y.o.   MRN: [id number] Patient ID: Rachel [name], female   DOB: 09-25-1950, 65 y.o.   MRN: [id number] Patient ID: Rachel [name], female   DOB: [birth year], 65 y.o.   MRN: [id number] [facility] [id number] Progress Note Rachel [name] MRN: [id number] DOB: [birth year] Age: 65 y.o.  Date: 12/19/2015 Start Time: 1:18 PM End Time: 1:53 PM  Chief Complaint: Chief Complaint  Patient presents with  . Schizophrenia   Subjective: "She Was in the hospital." This patient is a 65 year old married black female lives with her husband in [place]. She has not worked in years but is not on disability. She is a very poor historian   Apparently she has had mental illness for many years. At one point she was depressed but also had psychotic symptoms that sound congruent with schizophrenia. Her husband thinks she is doing pretty well on her current medications but she tends to sleep too much during the day. We've  looked at her med list and found that her trazodone might be too high. She does better if she only takes 100 mg instead of 200 mg. She also takes meclizine in the morning which may be contributing to drowsiness. Neither one of them remember why she is on lthis.  The patient denies being depressed but her energy is poor and she is drowsy. She doesn't sleep well she'll start to have visual hallucinations at night. This happens if she sleeps throughout the whole day. She denies auditory hallucinations or paranoia.  The patient returns after 3 months with her husband. She was in the hospital test after she had a subarachnoid frontal parietal hemorrhage. This thought to have come from one of her falls. For while she couldn't use her right side but this is getting much better now with physical therapy. Her aspirin was discontinued but nothing much else was change and she had to spend a short time in a skilled nursing facility. She is now back at home and her husband is taking good care of her. He has to walk with her everywhere she goes. She seems zoned out as she usually does but she can answer questions superficially. He states that she is sleeping more in the day and will sleep at night so I suggested we increase the trazodone to 100 mg at bedtime. She had been taking 75 mg. The husband states that if she doesn't sleep well  she begins to have auditory hallucinations again      Past psychiatric history Patient has been seeing in this office since 1998.  She was admitted at [facility] due to severe depression and psychotic features.  At that time she was having suicidal thoughts .  Since then she has been stable on current psychiatric medication. Allergies: Allergies  Allergen Reactions  . Sulfa Antibiotics Anaphylaxis  . Ibuprofen Other (See Comments)    unknown  . Shellfish Allergy Swelling    Mouth  Medical History: Past Medical History:  Diagnosis Date  . Blindness   . Diabetes  mellitus type I ([place])   . GERD (gastroesophageal reflux disease)   . Hyperlipidemia   . Paroxysmal atrial fibrillation (HCC)   . Schizophrenia ([place])   . Systemic hypertension   . Vertigo   Patient has history of vertigo, GERD, hyperlipidemia, diabetes mellitus.  Patient is also legally blind.  She see Dr. Karren Burly for her annual checkup and will follow up for elevated Hemo A1c with him. Surgical History: Past Surgical History:  Procedure Laterality Date  . BRAIN TUMOR EXCISION    . NM MYOCAR PERF WALL MOTION  02/01/2007   no significant ischemia  . US ECHOCARDIOGRAPHY  12/20/2003   mild mitral annular ca+,mild MR,TR,PI,AOV mildly sclerotic  Reviewed during this appointment. Vitals: BP 134/71   Pulse 82   Ht 5\' 8"  (1.727 m)   Wt 174 lb 6.4 oz (79.1 kg)   BMI 26.52 kg/m  Wt down almost 3 pounds.  Mental status examination Patient is casually dressed and fairly groomed. Patient is legally blind and need her husband`s  assistance for walking. She described her mood as ok.  She is calm and not at all fidgety today. Her affect is blunted. Her speech is sparse and she answers in monosyllables She denies any active or passive suicidal thinking and homicidal thinking. Her thought process is  Negative for paranoid statementsShe is very blunted . She denies any auditory or visual hallucination husband reports that she gets these when she is tired  She has poverty of thought content.  She's alert and oriented x3 her insight judgment and impulse control is okay. There no tremors shakes or extrapyramidal side effects noted.  Lab Results:  Results for orders placed or performed during the hospital encounter of 09/17/15 (from the past 8736 hour(s))  Comprehensive metabolic panel   Collection Time: 09/17/15 12:31 PM  Result Value Ref Range   Sodium 137 135 - 145 mmol/L   Potassium 3.9 3.5 - 5.1 mmol/L   Chloride 105 101 - 111 mmol/L   CO2 25 22 - 32 mmol/L   Glucose, Bld 266 (H) 65 - 99 mg/dL    BUN 6 6 - 20 mg/dL   Creatinine, Ser 0.89 0.44 - 1.00 mg/dL   Calcium 10.2 8.9 - 10.3 mg/dL   Total Protein 6.7 6.5 - 8.1 g/dL   Albumin 3.8 3.5 - 5.0 g/dL   AST 19 15 - 41 U/L   ALT 15 14 - 54 U/L   Alkaline Phosphatase 56 38 - 126 U/L   Total Bilirubin 0.5 0.3 - 1.2 mg/dL   GFR calc non Af Amer >60 >60 mL/min   GFR calc Af Amer >60 >60 mL/min   Anion gap 7 5 - 15  CBC with Differential   Collection Time: 09/17/15 12:31 PM  Result Value Ref Range   WBC 10.2 4.0 - 10.5 K/uL   RBC 4.60 3.87 - 5.11 MIL/uL  Hemoglobin 13.9 12.0 - 15.0 g/dL   HCT 42.0 36.0 - 46.0 %   MCV 91.3 78.0 - 100.0 fL   MCH 30.2 26.0 - 34.0 pg   MCHC 33.1 30.0 - 36.0 g/dL   RDW 12.8 11.5 - 15.5 %   Platelets 255 150 - 400 K/uL   Neutrophils Relative % 72 %   Neutro Abs 7.3 1.7 - 7.7 K/uL   Lymphocytes Relative 18 %   Lymphs Abs 1.9 0.7 - 4.0 K/uL   Monocytes Relative 8 %   Monocytes Absolute 0.8 0.1 - 1.0 K/uL   Eosinophils Relative 1 %   Eosinophils Absolute 0.1 0.0 - 0.7 K/uL   Basophils Relative 1 %   Basophils Absolute 0.1 0.0 - 0.1 K/uL  I-stat troponin, ED   Collection Time: 09/17/15  3:51 PM  Result Value Ref Range   Troponin i, poc 0.00 0.00 - 0.08 ng/mL   Comment 3          Protime-INR   Collection Time: 09/17/15  4:04 PM  Result Value Ref Range   Prothrombin Time 13.3 11.4 - 15.2 seconds   INR 1.01   Digoxin level   Collection Time: 09/17/15  4:04 PM  Result Value Ref Range   Digoxin Level 0.6 (L) 0.8 - 2.0 ng/mL  Urinalysis, Routine w reflex microscopic (not at [facility])   Collection Time: 09/17/15  4:58 PM  Result Value Ref Range   Color, Urine AMBER (A) YELLOW   APPearance CLEAR CLEAR   Specific Gravity, Urine 1.029 1.005 - 1.030   pH 5.0 5.0 - 8.0   Glucose, UA >1000 (A) NEGATIVE mg/dL   Hgb urine dipstick TRACE (A) NEGATIVE   Bilirubin Urine NEGATIVE NEGATIVE   Ketones, ur 15 (A) NEGATIVE mg/dL   Protein, ur 30 (A) NEGATIVE mg/dL   Nitrite NEGATIVE NEGATIVE   Leukocytes, UA  NEGATIVE NEGATIVE  Urine microscopic-add on   Collection Time: 09/17/15  4:58 PM  Result Value Ref Range   Squamous Epithelial / LPF 6-30 (A) NONE SEEN   WBC, UA 0-5 0 - 5 WBC/hpf   RBC / HPF 0-5 0 - 5 RBC/hpf   Bacteria, UA FEW (A) NONE SEEN   Urine-Other MUCOUS PRESENT   MRSA PCR Screening   Collection Time: 09/17/15 10:20 PM  Result Value Ref Range   MRSA by PCR NEGATIVE NEGATIVE  Glucose, capillary   Collection Time: 09/17/15 10:26 PM  Result Value Ref Range   Glucose-Capillary 125 (H) 65 - 99 mg/dL  Glucose, capillary   Collection Time: 09/18/15  8:19 AM  Result Value Ref Range   Glucose-Capillary 157 (H) 65 - 99 mg/dL   Comment 1 Notify RN    Comment 2 Document in Chart   Glucose, capillary   Collection Time: 09/18/15 12:20 PM  Result Value Ref Range   Glucose-Capillary 230 (H) 65 - 99 mg/dL   Comment 1 Notify RN    Comment 2 Document in Chart   Glucose, capillary   Collection Time: 09/18/15  4:41 PM  Result Value Ref Range   Glucose-Capillary 252 (H) 65 - 99 mg/dL   Comment 1 Notify RN    Comment 2 Document in Chart   Glucose, capillary   Collection Time: 09/18/15  9:02 PM  Result Value Ref Range   Glucose-Capillary 220 (H) 65 - 99 mg/dL   Comment 1 Notify RN    Comment 2 Document in Chart   Glucose, capillary   Collection Time: 09/19/15  6:47  AM  Result Value Ref Range   Glucose-Capillary 157 (H) 65 - 99 mg/dL   Comment 1 Notify RN    Comment 2 Document in Chart   Hemoglobin A1c   Collection Time: 09/19/15  7:37 AM  Result Value Ref Range   Hgb A1c MFr Bld 7.9 (H) 4.8 - 5.6 %   Mean Plasma Glucose 180 mg/dL  Glucose, capillary   Collection Time: 09/19/15  9:19 AM  Result Value Ref Range   Glucose-Capillary 131 (H) 65 - 99 mg/dL  Glucose, capillary   Collection Time: 09/19/15 11:09 AM  Result Value Ref Range   Glucose-Capillary 121 (H) 65 - 99 mg/dL   Comment 1 Notify RN    Comment 2 Document in Chart   Glucose, capillary   Collection Time:  09/19/15  4:25 PM  Result Value Ref Range   Glucose-Capillary 273 (H) 65 - 99 mg/dL  Glucose, capillary   Collection Time: 09/19/15  9:12 PM  Result Value Ref Range   Glucose-Capillary 277 (H) 65 - 99 mg/dL   Comment 1 Notify RN    Comment 2 Document in Chart   Glucose, capillary   Collection Time: 09/20/15  6:21 AM  Result Value Ref Range   Glucose-Capillary 170 (H) 65 - 99 mg/dL   Comment 1 Notify RN    Comment 2 Document in Chart   Glucose, capillary   Collection Time: 09/20/15 11:11 AM  Result Value Ref Range   Glucose-Capillary 154 (H) 65 - 99 mg/dL   Comment 1 Notify RN    Comment 2 Document in Chart   Glucose, capillary   Collection Time: 09/20/15  4:16 PM  Result Value Ref Range   Glucose-Capillary 268 (H) 65 - 99 mg/dL  Protein electrophoresis, serum   Collection Time: 09/20/15  4:37 PM  Result Value Ref Range   Total Protein ELP 6.3 6.0 - 8.5 g/dL   Albumin ELP 3.4 2.9 - 4.4 g/dL   Alpha-1-Globulin 0.2 0.0 - 0.4 g/dL   Alpha-2-Globulin 0.7 0.4 - 1.0 g/dL   Beta Globulin 0.9 0.7 - 1.3 g/dL   Gamma Globulin 1.1 0.4 - 1.8 g/dL   M-Spike, % Not Observed Not Observed g/dL   SPE Interp. Comment    Comment Comment    GLOBULIN, TOTAL 2.9 2.2 - 3.9 g/dL   A/G Ratio 1.2 0.7 - 1.7  Glucose, capillary   Collection Time: 09/20/15  9:00 PM  Result Value Ref Range   Glucose-Capillary 335 (H) 65 - 99 mg/dL   Comment 1 Notify RN    Comment 2 Document in Chart   Glucose, capillary   Collection Time: 09/21/15  6:17 AM  Result Value Ref Range   Glucose-Capillary 222 (H) 65 - 99 mg/dL   Comment 1 Notify RN    Comment 2 Document in Chart   Glucose, capillary   Collection Time: 09/21/15 11:24 AM  Result Value Ref Range   Glucose-Capillary 183 (H) 65 - 99 mg/dL   Comment 1 Notify RN    Comment 2 Document in Chart   Glucose, capillary   Collection Time: 09/21/15  5:04 PM  Result Value Ref Range   Glucose-Capillary 230 (H) 65 - 99 mg/dL   Comment 1 Notify RN    Comment 2  Document in Chart   Glucose, capillary   Collection Time: 09/21/15  9:40 PM  Result Value Ref Range   Glucose-Capillary 295 (H) 65 - 99 mg/dL  Glucose, capillary   Collection Time: 09/22/15  6:03 AM  Result Value Ref Range   Glucose-Capillary 256 (H) 65 - 99 mg/dL   Comment 1 Notify RN    Comment 2 Document in Chart   Glucose, capillary   Collection Time: 09/22/15 11:48 AM  Result Value Ref Range   Glucose-Capillary 192 (H) 65 - 99 mg/dL  Glucose, capillary   Collection Time: 09/22/15  4:55 PM  Result Value Ref Range   Glucose-Capillary 293 (H) 65 - 99 mg/dL  Glucose, capillary   Collection Time: 09/22/15  9:59 PM  Result Value Ref Range   Glucose-Capillary 250 (H) 65 - 99 mg/dL   Comment 1 Notify RN    Comment 2 Document in Chart   Glucose, capillary   Collection Time: 09/23/15  6:24 AM  Result Value Ref Range   Glucose-Capillary 173 (H) 65 - 99 mg/dL   Comment 1 Notify RN    Comment 2 Document in Chart   Glucose, capillary   Collection Time: 09/23/15 11:45 AM  Result Value Ref Range   Glucose-Capillary 205 (H) 65 - 99 mg/dL  Glucose, capillary   Collection Time: 09/23/15  4:39 PM  Result Value Ref Range   Glucose-Capillary 249 (H) 65 - 99 mg/dL   Comment 1 QC Due   Glucose, capillary   Collection Time: 09/23/15 10:24 PM  Result Value Ref Range   Glucose-Capillary 112 (H) 65 - 99 mg/dL   Comment 1 Notify RN    Comment 2 Document in Chart   Glucose, capillary   Collection Time: 09/24/15  6:26 AM  Result Value Ref Range   Glucose-Capillary 200 (H) 65 - 99 mg/dL  Glucose, capillary   Collection Time: 09/24/15 10:58 AM  Result Value Ref Range   Glucose-Capillary 289 (H) 65 - 99 mg/dL   Comment 1 Notify RN    Comment 2 Document in Chart   Glucose, capillary   Collection Time: 09/24/15  4:14 PM  Result Value Ref Range   Glucose-Capillary 262 (H) 65 - 99 mg/dL   Comment 1 Notify RN    Comment 2 Document in Chart   Glucose, capillary   Collection Time: 09/24/15  10:23 PM  Result Value Ref Range   Glucose-Capillary 232 (H) 65 - 99 mg/dL   Comment 1 Notify RN    Comment 2 Document in Chart   CBC   Collection Time: 09/25/15  4:29 AM  Result Value Ref Range   WBC 10.0 4.0 - 10.5 K/uL   RBC 4.26 3.87 - 5.11 MIL/uL   Hemoglobin 12.6 12.0 - 15.0 g/dL   HCT 38.9 36.0 - 46.0 %   MCV 91.3 78.0 - 100.0 fL   MCH 29.6 26.0 - 34.0 pg   MCHC 32.4 30.0 - 36.0 g/dL   RDW 12.4 11.5 - 15.5 %   Platelets 166 150 - 400 K/uL  Basic metabolic panel   Collection Time: 09/25/15  4:29 AM  Result Value Ref Range   Sodium 137 135 - 145 mmol/L   Potassium 3.9 3.5 - 5.1 mmol/L   Chloride 104 101 - 111 mmol/L   CO2 26 22 - 32 mmol/L   Glucose, Bld 179 (H) 65 - 99 mg/dL   BUN 13 6 - 20 mg/dL   Creatinine, Ser 0.70 0.44 - 1.00 mg/dL   Calcium 10.3 8.9 - 10.3 mg/dL   GFR calc non Af Amer >60 >60 mL/min   GFR calc Af Amer >60 >60 mL/min   Anion gap 7 5 - 15  Glucose, capillary  Collection Time: 09/25/15  6:31 AM  Result Value Ref Range   Glucose-Capillary 147 (H) 65 - 99 mg/dL   Comment 1 Notify RN    Comment 2 Document in Chart   Glucose, capillary   Collection Time: 09/25/15 11:01 AM  Result Value Ref Range   Glucose-Capillary 159 (H) 65 - 99 mg/dL   Comment 1 Notify RN    Comment 2 Document in Chart   Glucose, capillary   Collection Time: 09/25/15  4:36 PM  Result Value Ref Range   Glucose-Capillary 206 (H) 65 - 99 mg/dL   Comment 1 Notify RN    Comment 2 Document in Chart   Glucose, capillary   Collection Time: 09/25/15  9:22 PM  Result Value Ref Range   Glucose-Capillary 185 (H) 65 - 99 mg/dL   Comment 1 Notify RN    Comment 2 Document in Chart   Glucose, capillary   Collection Time: 09/26/15  6:21 AM  Result Value Ref Range   Glucose-Capillary 99 65 - 99 mg/dL   Comment 1 Notify RN    Comment 2 Document in Chart   CBC   Collection Time: 09/26/15  6:28 AM  Result Value Ref Range   WBC 9.1 4.0 - 10.5 K/uL   RBC 4.47 3.87 - 5.11 MIL/uL    Hemoglobin 13.5 12.0 - 15.0 g/dL   HCT 40.8 36.0 - 46.0 %   MCV 91.3 78.0 - 100.0 fL   MCH 30.2 26.0 - 34.0 pg   MCHC 33.1 30.0 - 36.0 g/dL   RDW 12.3 11.5 - 15.5 %   Platelets 175 150 - 400 K/uL  Basic metabolic panel   Collection Time: 09/26/15  6:28 AM  Result Value Ref Range   Sodium 139 135 - 145 mmol/L   Potassium 4.2 3.5 - 5.1 mmol/L   Chloride 104 101 - 111 mmol/L   CO2 28 22 - 32 mmol/L   Glucose, Bld 101 (H) 65 - 99 mg/dL   BUN 7 6 - 20 mg/dL   Creatinine, Ser 0.61 0.44 - 1.00 mg/dL   Calcium 10.3 8.9 - 10.3 mg/dL   GFR calc non Af Amer >60 >60 mL/min   GFR calc Af Amer >60 >60 mL/min   Anion gap 7 5 - 15  Protein electrophoresis, serum   Collection Time: 09/26/15  6:28 AM  Result Value Ref Range   Total Protein ELP 6.1 6.0 - 8.5 g/dL   Albumin ELP 3.2 2.9 - 4.4 g/dL   Alpha-1-Globulin 0.2 0.0 - 0.4 g/dL   Alpha-2-Globulin 0.8 0.4 - 1.0 g/dL   Beta Globulin 0.9 0.7 - 1.3 g/dL   Gamma Globulin 1.0 0.4 - 1.8 g/dL   M-Spike, % Not Observed Not Observed g/dL   SPE Interp. Comment    Comment Comment    GLOBULIN, TOTAL 2.9 2.2 - 3.9 g/dL   A/G Ratio 1.1 0.7 - 1.7  Glucose, capillary   Collection Time: 09/26/15 11:36 AM  Result Value Ref Range   Glucose-Capillary 83 65 - 99 mg/dL   Comment 1 Notify RN    Comment 2 Document in Chart   Glucose, capillary   Collection Time: 09/26/15  4:30 PM  Result Value Ref Range   Glucose-Capillary 127 (H) 65 - 99 mg/dL   Comment 1 Notify RN    Comment 2 Document in Chart   Results for orders placed or performed during the hospital encounter of 08/15/15 (from the past 8736 hour(s))  Urinalysis, Routine w reflex  microscopic (not at [facility])   Collection Time: 08/15/15  1:27 PM  Result Value Ref Range   Color, Urine YELLOW YELLOW   APPearance CLEAR CLEAR   Specific Gravity, Urine 1.010 1.005 - 1.030   pH 7.5 5.0 - 8.0   Glucose, UA NEGATIVE NEGATIVE mg/dL   Hgb urine dipstick SMALL (A) NEGATIVE   Bilirubin Urine NEGATIVE  NEGATIVE   Ketones, ur NEGATIVE NEGATIVE mg/dL   Protein, ur NEGATIVE NEGATIVE mg/dL   Nitrite NEGATIVE NEGATIVE   Leukocytes, UA TRACE (A) NEGATIVE  Urine rapid drug screen (hosp performed)   Collection Time: 08/15/15  1:27 PM  Result Value Ref Range   Opiates NONE DETECTED NONE DETECTED   Cocaine NONE DETECTED NONE DETECTED   Benzodiazepines POSITIVE (A) NONE DETECTED   Amphetamines NONE DETECTED NONE DETECTED   Tetrahydrocannabinol NONE DETECTED NONE DETECTED   Barbiturates NONE DETECTED NONE DETECTED  Urine microscopic-add on   Collection Time: 08/15/15  1:27 PM  Result Value Ref Range   Squamous Epithelial / LPF 0-5 (A) NONE SEEN   WBC, UA 0-5 0 - 5 WBC/hpf   RBC / HPF 0-5 0 - 5 RBC/hpf   Bacteria, UA RARE (A) NONE SEEN  Comprehensive metabolic panel   Collection Time: 08/15/15  2:01 PM  Result Value Ref Range   Sodium 136 135 - 145 mmol/L   Potassium 3.5 3.5 - 5.1 mmol/L   Chloride 106 101 - 111 mmol/L   CO2 24 22 - 32 mmol/L   Glucose, Bld 145 (H) 65 - 99 mg/dL   BUN 6 6 - 20 mg/dL   Creatinine, Ser 0.72 0.44 - 1.00 mg/dL   Calcium 9.6 8.9 - 10.3 mg/dL   Total Protein 7.1 6.5 - 8.1 g/dL   Albumin 3.7 3.5 - 5.0 g/dL   AST 15 15 - 41 U/L   ALT 13 (L) 14 - 54 U/L   Alkaline Phosphatase 58 38 - 126 U/L   Total Bilirubin 0.5 0.3 - 1.2 mg/dL   GFR calc non Af Amer >60 >60 mL/min   GFR calc Af Amer >60 >60 mL/min   Anion gap 6 5 - 15  CBC   Collection Time: 08/15/15  2:01 PM  Result Value Ref Range   WBC 7.7 4.0 - 10.5 K/uL   RBC 4.29 3.87 - 5.11 MIL/uL   Hemoglobin 12.8 12.0 - 15.0 g/dL   HCT 38.3 36.0 - 46.0 %   MCV 89.3 78.0 - 100.0 fL   MCH 29.8 26.0 - 34.0 pg   MCHC 33.4 30.0 - 36.0 g/dL   RDW 12.3 11.5 - 15.5 %   Platelets 233 150 - 400 K/uL  Digoxin level   Collection Time: 08/15/15  2:01 PM  Result Value Ref Range   Digoxin Level 0.4 (L) 0.8 - 2.0 ng/mL  Ethanol   Collection Time: 08/15/15  2:27 PM  Result Value Ref Range   Alcohol, Ethyl (B) <5 <5  mg/dL  CBG monitoring, ED   Collection Time: 08/15/15 11:02 PM  Result Value Ref Range   Glucose-Capillary 226 (H) 65 - 99 mg/dL   Comment 1 Notify RN    Comment 2 Document in Chart   CBG monitoring, ED   Collection Time: 08/16/15  6:02 AM  Result Value Ref Range   Glucose-Capillary 161 (H) 65 - 99 mg/dL   Comment 1 Notify RN    Comment 2 Document in Chart   CBG monitoring, ED   Collection Time: 08/16/15  7:18  AM  Result Value Ref Range   Glucose-Capillary 152 (H) 65 - 99 mg/dL  CBG monitoring, ED   Collection Time: 08/16/15  9:48 PM  Result Value Ref Range   Glucose-Capillary 350 (H) 65 - 99 mg/dL  CBG monitoring, ED   Collection Time: 08/16/15 11:42 PM  Result Value Ref Range   Glucose-Capillary 270 (H) 65 - 99 mg/dL  CBG monitoring, ED   Collection Time: 08/17/15  7:55 AM  Result Value Ref Range   Glucose-Capillary 139 (H) 65 - 99 mg/dL  CBG monitoring, ED   Collection Time: 08/17/15  9:58 PM  Result Value Ref Range   Glucose-Capillary 320 (H) 65 - 99 mg/dL  CBG monitoring, ED   Collection Time: 08/18/15  9:32 AM  Result Value Ref Range   Glucose-Capillary 109 (H) 65 - 99 mg/dL  CBG monitoring, ED   Collection Time: 08/18/15  6:22 PM  Result Value Ref Range   Glucose-Capillary 206 (H) 65 - 99 mg/dL   Comment 1 Document in Chart    Assessment Axis Ischizophrenia Axis II deferred Axis III see medical history Axis IV mild to moderate  Axis V 60 Plan: I took her vitals.  I reviewed CC, tobacco/med/surg Hx, meds effects/ side effects, problem list, therapies and responses as well as current situation/symptoms discussed options. She'll continue Paxil at 40 mg each bedtime for depression.  trazodone Will be increased to 100 mg each bedtime for sleep and continue Risperdal 2 mg each bedtime for schizophrenia.. She'll continue Xanax as needed for anxiety and return to see me in 3 months Her husband will call me if her situation worsens See orders and pt instructions for  more details.  MEDICATIONS this encounter: Meds ordered this encounter  Medications  . traZODone (DESYREL) 100 MG tablet    Sig: Take 1 tablet (100 mg total) by mouth at bedtime.    Dispense:  30 tablet    Refill:  2  . risperiDONE (RISPERDAL) 2 MG tablet    Sig: Take 1 tablet (2 mg total) by mouth at bedtime.    Dispense:  90 tablet    Refill:  2  . PARoxetine (PAXIL) 40 MG tablet    Sig: Take 1 tablet (40 mg total) by mouth daily.    Dispense:  90 tablet    Refill:  2  . ALPRAZolam (XANAX) 0.25 MG tablet    Sig: Take 1 tablet (0.25 mg total) by mouth 3 (three) times daily.    Dispense:  90 tablet    Refill:  2    Medical Decision Making Problem Points:  Established problem, stable/improving (1), Established problem, worsening (2), Review of last therapy session (1) and Review of psycho-social stressors (1) Data Points:  Review or order medicine tests (1) Review of medication regiment & side effects (2) Review of new medications or change in dosage (2)  I certify that outpatient services furnished can reasonably be expected to improve the patient's condition.   Levonne Spiller, MD

## 2015-12-30 ENCOUNTER — Ambulatory Visit (HOSPITAL_COMMUNITY): Payer: BLUE CROSS/BLUE SHIELD | Attending: Neurology

## 2015-12-30 ENCOUNTER — Ambulatory Visit (HOSPITAL_COMMUNITY): Payer: BLUE CROSS/BLUE SHIELD | Admitting: Physical Therapy

## 2015-12-30 DIAGNOSIS — R29898 Other symptoms and signs involving the musculoskeletal system: Secondary | ICD-10-CM

## 2015-12-30 DIAGNOSIS — R2689 Other abnormalities of gait and mobility: Secondary | ICD-10-CM | POA: Insufficient documentation

## 2015-12-30 DIAGNOSIS — M6281 Muscle weakness (generalized): Secondary | ICD-10-CM | POA: Diagnosis present

## 2015-12-30 DIAGNOSIS — R2681 Unsteadiness on feet: Secondary | ICD-10-CM | POA: Insufficient documentation

## 2015-12-30 DIAGNOSIS — R296 Repeated falls: Secondary | ICD-10-CM | POA: Diagnosis present

## 2015-12-30 NOTE — Therapy (Signed)
[place], [place], [address] Phone: [phone]   Fax:  [phone]  Physical Therapy Evaluation  Patient Details  Name: Rachel [name] MRN: [id number] Date of Birth: Sep 17, 1950 Referring Provider: Garvin Fila   Encounter Date: 12/30/2015      PT End of Session - 12/30/15 1456    Visit Number 1   Number of Visits 16   Date for PT Re-Evaluation 01/27/16   Authorization Type BCBS Other    Authorization Time Period 12/30/15 to 02/29/16   Authorization - Visit Number 1   Authorization - Number of Visits 12   PT Start Time [id number]   PT Stop Time 1427   PT Time Calculation (min) 40 min   Activity Tolerance Patient tolerated treatment well   Behavior During Therapy [facility] for tasks assessed/performed      Past Medical History:  Diagnosis Date  . Blindness   . Diabetes mellitus type I ([place])   . GERD (gastroesophageal reflux disease)   . Hyperlipidemia   . Paroxysmal atrial fibrillation (HCC)   . Schizophrenia ([place])   . Systemic hypertension   . Vertigo     Past Surgical History:  Procedure Laterality Date  . BRAIN TUMOR EXCISION    . NM MYOCAR PERF WALL MOTION  02/01/2007   no significant ischemia  . US ECHOCARDIOGRAPHY  12/20/2003   mild mitral annular ca+,mild MR,TR,PI,AOV mildly sclerotic    There were no vitals filed for this visit.       Subjective Assessment - 12/30/15 1353    Subjective Patient arrives today with her husband; he reports she had a hemorrhagic stroke in June or July, maybe after a fall. She did get PT in the hospital and in a nursing home, she has been discharged since from [facility] as well. Husband reports that the MD wants her to work on walking and balance, she has had a few falls over the past couple months, her husband states that it seems to be due to weakness. Her husband is always there to help her. Sometimes she has trouble with feeding as well.    Pertinent History HTN,  A-fib, DM, history of brain tumor and excision, facial palsy, SAH, partial sight L eye otherwise blind, schizophrenic    Patient Stated Goals improve balance and walking    Currently in Pain? Other (Comment)  does not appear to be in pain with facial expressions             [facility] PT Assessment - 12/30/15 0001      Assessment   Medical Diagnosis CVA   Referring Provider Garvin Fila    Onset Date/Surgical Date --  June-July 2017   Next MD Visit Dr. Leonie Man in May 2018   Prior Therapy PT in the hospital, SNF, and HHPT      Precautions   Precaution Comments fall     Balance Screen   Has the patient fallen in the past 6 months Yes   How many times? multiple, unsure how many    Has the patient had a decrease in activity level because of a fear of falling?  Yes   Is the patient reluctant to leave their home because of a fear of falling?  Yes     Prior Function   Level of Independence Independent;Independent with basic ADLs;Needs assistance with ADLs;Needs assistance with homemaking;Needs assistance with gait;Needs assistance with transfers   Vocation Retired     Consulting civil engineer  Right Hip Flexion 3/5   Right Hip ABduction 3-/5   Left Hip Flexion 3/5   Left Hip ABduction 3-/5   Right Knee Flexion 3/5   Right Knee Extension 4/5   Left Knee Flexion 3/5   Left Knee Extension 4/5   Right Ankle Dorsiflexion 3/5   Left Ankle Dorsiflexion 3/5     Bed Mobility   Bed Mobility Rolling Right   Rolling Right 2: Max assist   Rolling Left 3: Mod assist     Transfers   Transfers Sit to Stand;Stand to Sit;Supine to Sit;Sit to Supine   Sit to Stand 6: Modified independent (Device/Increase time)   Stand to Sit 6: Modified independent (Device/Increase time)   Supine to Sit 2: Max assist   Sit to Supine 4: Min assist     Ambulation/Gait   Gait Comments flexed at hips, shuffling gait pattern with ER R LE, reduced gait speed, fall risk, requires cues to navigation environment.                             PT Education - 12/30/15 1455    Education provided Yes   Education Details prognosis, POC, HEP    Person(s) Educated Patient;Spouse   Methods Explanation;Demonstration;Handout   Comprehension Verbalized understanding;Returned demonstration          PT Short Term Goals - 12/30/15 1501      PT SHORT TERM GOAL #1   Title Patient to be able to complete all bed mobility and transfers with min assist in order to improve mobility at home    Time 4   Period Weeks   Status New     PT SHORT TERM GOAL #2   Title Patient to be able to complete all sit to stand transfers with good control and without crashing down to the seat in order to improve mobility and safety    Time 4   Period Weeks   Status New     PT SHORT TERM GOAL #3   Title Patient to be able sit unsupported statically for at least 15 minutes in order to show improved core strength and allow her to sit at the table to eat a short meal    Time 4   Period Weeks   Status New     PT SHORT TERM GOAL #4   Title Patient to be able to stand unsupported statically for at least 5 minutes in order to show improvement of condition and improved balance    Time 4   Period Weeks   Status New     PT SHORT TERM GOAL #5   Title Patient to be able to consistently perform HEP with her husband's help in order to facilitate improvement in condition    Time 1   Period Weeks   Status New           PT Long Term Goals - 12/30/15 1506      PT LONG TERM GOAL #1   Title Patient to be modified independent in all bed mobility and functional transfers in order to improve function and reduce caregiver burden at home    Time 8   Period Weeks   Status New     PT LONG TERM GOAL #2   Title Patient to be able to sit unsupported for 30 minutes in order to show improved core/trunk strength and allow her to sit and eat a long meal  Time 8   Period Weeks   Status New     PT LONG TERM GOAL #3   Title  Patient to be able to statically stand unsupported for 15 minutes to show improved functional strength and balance, reduced fall risk    Time 8   Period Weeks   Status New     PT LONG TERM GOAL #4   Title Patient to be able to ambulate with LRAD and no assistance from spouse beyond min guard in order to show improved general mobility with reduced fall risk    Time 8   Period Weeks   Status New     PT LONG TERM GOAL #5   Title Patient to demonstrate functional strength at least 4+/5 in all tested groups in order to improve mobility and balance    Time 8   Period Weeks   Status New               Plan - 12/30/15 1457    Clinical Impression Statement Patient arrives after having sustained a hemorrhage in June-July of this year per her husband; she was at the point initially where she was unable to move her R UE/LE, but this has gotten much better with PT in the hospital, SNF, and HHPT from which she has been DC-ed. Patient is blind except for some partial vision in her L eye, and does not say much during evaluation- husband answers most of questions today. Upon examination, patient reveals significant difficulty with functional bed mobility and transfers, functional strength, has quite a bit of general unsteadiness, severe gait deviation, poor core strength, and in general presents as quite a large fall risk. Patient did become dizzy at edge of mat table today and required PT assist to sit up; BP 139/84 and dizziness resolved with extended sitting. Husband reveals that she spends most of her day in a recliner; he really does quite a bit for her. She will benefit from skilled PT services in order to address functional impairments, reduce fall risk, and optimize overall level of function.    Rehab Potential Good   Clinical Impairments Affecting Rehab Potential supportive spouse, severity of impairment   PT Frequency 2x / week   PT Duration 8 weeks   PT Treatment/Interventions ADLs/Self Care  Home Management;Biofeedback;DME Instruction;Gait training;Stair training;Functional mobility training;Therapeutic activities;Therapeutic exercise;Balance training;Neuromuscular re-education;Patient/family education;Manual techniques;Energy conservation;Taping   PT Next Visit Plan review goals and HEP; bed mobility and core work, gait with cane, strengthening, balance    PT Home Exercise Plan eval: lateral weight shifts, unsupported sitting, slow sit to stand/stand to sit    Consulted and Agree with Plan of Care Patient;Family member/caregiver      Patient will benefit from skilled therapeutic intervention in order to improve the following deficits and impairments:  Abnormal gait, Dizziness, Improper body mechanics, Pain, Decreased coordination, Decreased mobility, Postural dysfunction, Decreased activity tolerance, Decreased endurance, Decreased strength, Decreased balance, Decreased safety awareness, Difficulty walking  Visit Diagnosis: Muscle weakness (generalized) - Plan: PT plan of care cert/re-cert  Unsteadiness on feet - Plan: PT plan of care cert/re-cert  Other abnormalities of gait and mobility - Plan: PT plan of care cert/re-cert  Other symptoms and signs involving the musculoskeletal system - Plan: PT plan of care cert/re-cert  Repeated falls - Plan: PT plan of care cert/re-cert     Problem List Patient Active Problem List   Diagnosis Date Noted  . 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 Blind   . Schizophrenia (  [place])   . Diabetes mellitus type 2 in nonobese ([place])   . Acute encephalopathy   . Brain mass   . Subarachnoid hemorrhage following injury, no loss of consciousness ([place]) 09/18/2015  . SAH (subarachnoid hemorrhage) ([place]) 09/17/2015  . Facial droop 12/04/2013  . 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 Blind in both eyes 05/31/2013  . HTN (hypertension) 10/11/2012  . PAF (paroxysmal atrial fibrillation) ([place]) 10/11/2012  . Dyslipidemia 10/11/2012  . H/O brain tumor 10/11/2012  .  Insomnia due to mental disorder 12/29/2011  . Libido, decreased 12/29/2011  . Depression 04/14/2011    Deniece Ree PT, DPT [facility] [address] [place], [place], [address] Phone: [phone]   Fax:  [phone]  Name: Rachel [name] MRN: [id number] Date of Birth: 1950-11-07

## 2015-12-30 NOTE — Patient Instructions (Signed)
    SIT TO STAND - NO SUPPORT  Start by scooting close to the front of the chair.  Next, lean forward at your trunk and reach forward with your arms and rise to standing without using your hands to push off from the chair or other object.   Use your arms as a counter-balance by reaching forward when in sitting and lower them as you approach standing.  Sit back down, making sure to go slow and not "plop" down.  Repeat 5 times, twice a day.    WEIGHT SHIFT - LATERAL  While in a standing position and knees partially bent, slowly shift your body weight side-to-side.   Repeat 10 times, twice a day.    STATIC SITTING   Sitting in a dining room chair, sit so your back is unsupported and your tummy/back muscles are having to do the work. Hold this position for 3 minutes and add 1-2 minutes every 3 days. This is very important to build your core and back muscles

## 2015-12-31 ENCOUNTER — Encounter (HOSPITAL_COMMUNITY): Payer: Self-pay

## 2015-12-31 NOTE — Therapy (Signed)
[place], [place], [address] Phone: [phone]   Fax:  [phone]  Occupational Therapy Evaluation  Patient Details  Name: Rachel [name] MRN: [id number] Date of Birth: 01-27-1951 Referring Provider: Antony Contras, MD  Encounter Date: 12/30/2015      OT End of Session - 12/31/15 1508    Visit Number 1   Number of Visits 8   Date for OT Re-Evaluation 01/30/16   Authorization Type BCBS   OT Start Time 1440   OT Stop Time 1515   OT Time Calculation (min) 35 min   Activity Tolerance Patient tolerated treatment well;No increased pain   Behavior During Therapy Flat affect      Past Medical History:  Diagnosis Date  . Blindness   . Diabetes mellitus type I ([place])   . GERD (gastroesophageal reflux disease)   . Hyperlipidemia   . Paroxysmal atrial fibrillation (HCC)   . Schizophrenia ([place])   . Systemic hypertension   . Vertigo     Past Surgical History:  Procedure Laterality Date  . BRAIN TUMOR EXCISION    . NM MYOCAR PERF WALL MOTION  02/01/2007   no significant ischemia  . US ECHOCARDIOGRAPHY  12/20/2003   mild mitral annular ca+,mild MR,TR,PI,AOV mildly sclerotic    There were no vitals filed for this visit.      Subjective Assessment - 12/30/15 0843    Subjective  S: Patient made no subjective statement.    Patient is accompained by: Family member  Husband - Johnny   Pertinent History TATEYANA DIPONIO is an 65 y.o. female with a history of Rosai-Dorfman disease, resection of an occipital mass lesion, hypertension, paroxysmal atrial fibrillation, hyperlipidemia, schizophrenia and diabetes mellitus, presenting with new onset weakness involving right leg for 2 days. She has no previous history of stroke no TIA. She's been taking aspirin 81 mg per day. MRI of the brain showed findings indicative of acute to subacute left frontoparietal subarachnoid hemorrhage, and addition to enhancing mass  lesions in the left middle cranial fossa and left lateral temporal region. Plaque-like enhancement was noted involving anterior middle cranial fossa on the right, as well as an enhancing lesion involving right optic nerve. Postsurgical changes in the occipital region noted. Patient has had no change in speech and no symptoms involving the right upper extremity. She has chronic right facial weakness which is unchanged. MRI showed L frontoparietal medial SAH. Her home ASA discontinued. BP and glucose were controlled. PT/OT recommended CIR first and then changed to SNF. On 09/23/15 found to have right arm weakness, husband stated that she had on and off right arm weakness. EEG negative. Repeat MRI no change. Continued PT/OT. Transfer to SNF for continued rehab. She was seen on consultation by neurosurgery who felt that the patient subarachnoid hemorrhage was probably related to unrecognized, and not due to aneurysm or AVM and hence further vascular imaging was not done. Patient remained stable and has been discharged from rehabilitation and has even finish home physical and occupation therapy. She is able to walk without assistance but feels her legs often drags on her balance is not the same. She's had a few minor falls but fortunately no major injuries. She has been advised to use a cane but she refuses to do so. Follow-up MRI scan of the brain showed stable appearance of the   subarachnoid hemorrhage as well as the brain lesions. She has a known history of long-standing Rosai Dorfman  disease  and had undergone occipital craniotomy for removal of one such lesion and has had stable appearance of follow-up meningeal lesions in the left middle cranial fossa.    Patient Stated Goals Husband would like her to be able to feed herself better (ie. be able to hold onto utensils).           [facility] OT Assessment - 12/31/15 1459      Assessment   Diagnosis Late effects of CVA   Referring Provider Antony Contras, MD    Onset Date --  July 2017   Prior Therapy Patient did receive OT services in SNF and during Home health.     Precautions   Precautions Fall     Restrictions   Weight Bearing Restrictions No     Balance Screen   Has the patient fallen in the past 6 months --  unknown     Home  Environment   Family/patient expects to be discharged to: Private residence   Living Arrangements Spouse/significant other     Prior Function   Level of Independence Needs assistance with ADLs   Vocation Retired     ADL   ADL comments Husband reports that her main deficits is being able to feed herself which she has difficulty holding onto the utensils.      Mobility   Mobility Status Independent     Written Expression   Dominant Hand Right     Vision - History   Baseline Vision Other (comment)   Visual History --  Limited vision in left eye only.     Cognition   Overall Cognitive Status Difficult to assess     ROM / Strength   AROM / PROM / Strength Strength;AROM     AROM   Overall AROM  Within functional limits for tasks performed   AROM Assessment Site Shoulder     Strength   Overall Strength Comments Assessed seated. IR/er adducted   Strength Assessment Site Shoulder;Elbow;Hand   Right/Left Shoulder Right;Left   Right Shoulder Flexion 5/5   Right Shoulder ABduction 5/5   Right Shoulder Internal Rotation 5/5   Right Shoulder External Rotation 5/5   Left Shoulder Flexion 5/5   Left Shoulder ABduction 5/5   Left Shoulder Internal Rotation 5/5   Left Shoulder External Rotation 5/5   Right/Left Elbow Left;Right   Right Elbow Flexion 5/5   Right Elbow Extension 5/5   Left Elbow Flexion 5/5   Left Elbow Extension 5/5   Right/Left hand Right;Left   Right Hand Grip (lbs) 26   Right Hand Lateral Pinch 3 lbs   Right Hand 3 Point Pinch 2 lbs   Left Hand Grip (lbs) 26   Left Hand Lateral Pinch 4 lbs   Left Hand 3 Point Pinch 8 lbs                         OT  Education - 12/31/15 1506    Education provided Yes   Education Details Husband was given built foam for utensils and a universal cuff to increase ability for patient to hold utensils during self feeding. Patient does not have good carry over with a HEP from the past so exercises were not given at this time.    Person(s) Educated Patient;Spouse   Methods Explanation;Demonstration   Comprehension Verbalized understanding;Returned demonstration          OT Short Term Goals - 12/30/15 1513      OT SHORT TERM  GOAL #1   Title Patient/Husband will be educated on a HEP and/or adaptive devices; if needed  to increase functional performance during daily tasks.    Time 4   Period Weeks   Status New     OT SHORT TERM GOAL #2   Title Patient will increase bilateral grip strength by 20# and pinch strength by 5# to increase ability to complete fine motor tasks such as self feeding with decreased difficulty.   Time 4   Period Weeks   Status New     OT SHORT TERM GOAL #3   Title Patient will increase completion time of motor movements during daily tasks by 25%.   Time 4   Period Weeks   Status New                  Plan - 12/31/15 1458    Clinical Impression Statement A: Patient is a 65 y/o female presenting to OT with increased difficulty with self feeding tasks due to decreased grip and pinch strength and requiring additional assistance from husband.    Rehab Potential Good   Clinical Impairments Affecting Rehab Potential HTN, A-fib, DM, history of brain tumor and excision, facial palsy, SAH, partial sight L eye otherwise blind, schizophrenic   OT Frequency 2x / week   OT Duration 4 weeks   OT Treatment/Interventions Self-care/ADL training;Therapeutic exercise;Patient/family education;Neuromuscular education;Therapeutic activities;DME and/or AE instruction   Plan P: Patient will benefit from skilled OT services to increase functional performance during self feeding tasks. Treatment  Plan: Focus on grip and pinch strengthening activities. Limit exercises given for HEP as patient has poor carry over at home. Keep all activities as functional as possible.   Consulted and Agree with Plan of Care Patient;Family member/caregiver   Family Member Consulted Husband      Patient will benefit from skilled therapeutic intervention in order to improve the following deficits and impairments:  Decreased strength, Decreased coordination  Visit Diagnosis: Other symptoms and signs involving the musculoskeletal system - Plan: Ot plan of care cert/re-cert    Problem List Patient Active Problem List   Diagnosis Date Noted  . 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 Blind   . Schizophrenia ([place])   . Diabetes mellitus type 2 in nonobese (HCC)   . Acute encephalopathy   . Brain mass   . Subarachnoid hemorrhage following injury, no loss of consciousness ([place]) 09/18/2015  . SAH (subarachnoid hemorrhage) ([place]) 09/17/2015  . Facial droop 12/04/2013  . 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 Blind in both eyes 05/31/2013  . HTN (hypertension) 10/11/2012  . PAF (paroxysmal atrial fibrillation) ([place]) 10/11/2012  . Dyslipidemia 10/11/2012  . H/O brain tumor 10/11/2012  . Insomnia due to mental disorder 12/29/2011  . Libido, decreased 12/29/2011  . Depression 04/14/2011    Ailene Ravel, OTR/L,CBIS  [phone]  12/31/2015, 3:28 PM  [facility] [address] [place], [place], [address] Phone: [phone]   Fax:  [phone]  Name: Rachel [name] MRN: [id number] Date of Birth: February 16, 1950

## 2016-01-07 ENCOUNTER — Ambulatory Visit (HOSPITAL_COMMUNITY): Payer: BLUE CROSS/BLUE SHIELD | Admitting: Physical Therapy

## 2016-01-07 DIAGNOSIS — R29898 Other symptoms and signs involving the musculoskeletal system: Secondary | ICD-10-CM | POA: Diagnosis not present

## 2016-01-07 DIAGNOSIS — R296 Repeated falls: Secondary | ICD-10-CM

## 2016-01-07 DIAGNOSIS — M6281 Muscle weakness (generalized): Secondary | ICD-10-CM

## 2016-01-07 DIAGNOSIS — R2681 Unsteadiness on feet: Secondary | ICD-10-CM

## 2016-01-07 DIAGNOSIS — R2689 Other abnormalities of gait and mobility: Secondary | ICD-10-CM

## 2016-01-07 NOTE — Therapy (Signed)
[place], [place], [address] Phone: [phone]   Fax:  [phone]  Physical Therapy Treatment  Patient Details  Name: Rachel [name] MRN: [id number] Date of Birth: [birth year] Referring Provider: Garvin Fila   Encounter Date: 01/07/2016      PT End of Session - 01/07/16 1820    Visit Number 2   Number of Visits 16   Date for PT Re-Evaluation 01/27/16   Authorization Type BCBS Other    Authorization Time Period 12/30/15 to 02/29/16   Authorization - Visit Number 2   Authorization - Number of Visits 12   PT Start Time 1521   PT Stop Time 1600   PT Time Calculation (min) 39 min   Activity Tolerance Patient tolerated treatment well   Behavior During Therapy Flat affect      Past Medical History:  Diagnosis Date  . Blindness   . Diabetes mellitus type I ([place])   . GERD (gastroesophageal reflux disease)   . Hyperlipidemia   . Paroxysmal atrial fibrillation (HCC)   . Schizophrenia ([place])   . Systemic hypertension   . Vertigo     Past Surgical History:  Procedure Laterality Date  . BRAIN TUMOR EXCISION    . NM MYOCAR PERF WALL MOTION  02/01/2007   no significant ischemia  . US ECHOCARDIOGRAPHY  12/20/2003   mild mitral annular ca+,mild MR,TR,PI,AOV mildly sclerotic    There were no vitals filed for this visit.      Subjective Assessment - 01/07/16 1525    Subjective Patient arrives with her husband today; she reports no major changes since last time and no falls either.    Pertinent History HTN, A-fib, DM, history of brain tumor and excision, facial palsy, SAH, partial sight L eye otherwise blind, schizophrenic    Patient Stated Goals improve balance and walking    Currently in Pain? No/denies                         [facility] Adult PT Treatment/Exercise - 01/07/16 0001      Therapeutic Activites    Therapeutic Activities Other Therapeutic Activities   Other Therapeutic  Activities functional rolling training, sit to supine/supine to sit, sit to stand. MIn(A) for sit to stand, Mod-Max(A) for functional rolling and supine to sit. Minimal effort noted.  Practiced sitting upright in unsupported sitting with intermittent backward trunk drift noted, able to correct with min guard and verbal cues.      Knee/Hip Exercises: Seated   Long Arc Quad Both;1 set;10 reps   Long Arc Quad Weight 2 lbs.     Knee/Hip Exercises: Supine   Bridges Both;1 set;10 reps   Other Supine Knee/Hip Exercises clams with red TB 1x10                PT Education - 01/07/16 1819    Education provided Yes   Education Details reviewed goals    Person(s) Educated Patient;Spouse   Methods Explanation   Comprehension Verbalized understanding          PT Short Term Goals - 12/30/15 1501      PT SHORT TERM GOAL #1   Title Patient to be able to complete all bed mobility and transfers with min assist in order to improve mobility at home    Time 4   Period Weeks   Status New     PT SHORT TERM GOAL #2  Title Patient to be able to complete all sit to stand transfers with good control and without crashing down to the seat in order to improve mobility and safety    Time 4   Period Weeks   Status New     PT SHORT TERM GOAL #3   Title Patient to be able sit unsupported statically for at least 15 minutes in order to show improved core strength and allow her to sit at the table to eat a short meal    Time 4   Period Weeks   Status New     PT SHORT TERM GOAL #4   Title Patient to be able to stand unsupported statically for at least 5 minutes in order to show improvement of condition and improved balance    Time 4   Period Weeks   Status New     PT SHORT TERM GOAL #5   Title Patient to be able to consistently perform HEP with her husband's help in order to facilitate improvement in condition    Time 1   Period Weeks   Status New           PT Long Term Goals - 12/30/15  1506      PT LONG TERM GOAL #1   Title Patient to be modified independent in all bed mobility and functional transfers in order to improve function and reduce caregiver burden at home    Time 8   Period Weeks   Status New     PT LONG TERM GOAL #2   Title Patient to be able to sit unsupported for 30 minutes in order to show improved core/trunk strength and allow her to sit and eat a long meal    Time 8   Period Weeks   Status New     PT LONG TERM GOAL #3   Title Patient to be able to statically stand unsupported for 15 minutes to show improved functional strength and balance, reduced fall risk    Time 8   Period Weeks   Status New     PT LONG TERM GOAL #4   Title Patient to be able to ambulate with LRAD and no assistance from spouse beyond min guard in order to show improved general mobility with reduced fall risk    Time 8   Period Weeks   Status New     PT LONG TERM GOAL #5   Title Patient to demonstrate functional strength at least 4+/5 in all tested groups in order to improve mobility and balance    Time 8   Period Weeks   Status New               Plan - 01/07/16 1821    Clinical Impression Statement Initiated proximal strengthening program this session on mat table with tactile and audio cues for form due to visual impairment. Attempted to perform rolling and functional mobility training with Max cues however patient demonstrates minimal effort with mobility training today with DPT performing Max assist for much of mobility. Note difficulty with sit to stand today even from high mat table, although patient is able to perform sit to stand with hand hold assist from standard height chair. Recommend ongoing training and high levels of encouragement for participation with close monitoring of patient participation.    Rehab Potential Good   Clinical Impairments Affecting Rehab Potential supportive spouse, severity of impairment   PT Frequency 2x / week   PT Duration 8  weeks   PT Treatment/Interventions ADLs/Self Care Home Management;Biofeedback;DME Instruction;Gait training;Stair training;Functional mobility training;Therapeutic activities;Therapeutic exercise;Balance training;Neuromuscular re-education;Patient/family education;Manual techniques;Energy conservation;Taping   PT Next Visit Plan continue work on bed mobility and core; gait with cane; strength, balance. Monitor participation.    PT Home Exercise Plan eval: lateral weight shifts, unsupported sitting, slow sit to stand/stand to sit    Consulted and Agree with Plan of Care Patient;Family member/caregiver      Patient will benefit from skilled therapeutic intervention in order to improve the following deficits and impairments:  Abnormal gait, Dizziness, Improper body mechanics, Pain, Decreased coordination, Decreased mobility, Postural dysfunction, Decreased activity tolerance, Decreased endurance, Decreased strength, Decreased balance, Decreased safety awareness, Difficulty walking  Visit Diagnosis: Muscle weakness (generalized)  Unsteadiness on feet  Other abnormalities of gait and mobility  Other symptoms and signs involving the musculoskeletal system  Repeated falls     Problem List Patient Active Problem List   Diagnosis Date Noted  . 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 Blind   . Schizophrenia ([place])   . Diabetes mellitus type 2 in nonobese (HCC)   . Acute encephalopathy   . Brain mass   . Subarachnoid hemorrhage following injury, no loss of consciousness ([place]) 09/18/2015  . SAH (subarachnoid hemorrhage) ([place]) 09/17/2015  . Facial droop 12/04/2013  . 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 Blind in both eyes 05/31/2013  . HTN (hypertension) 10/11/2012  . PAF (paroxysmal atrial fibrillation) ([place]) 10/11/2012  . Dyslipidemia 10/11/2012  . H/O brain tumor 10/11/2012  . Insomnia due to mental disorder 12/29/2011  . Libido, decreased 12/29/2011  . Depression 04/14/2011    Deniece Ree PT,  DPT [facility] [address] [place], [place], [address] Phone: [phone]   Fax:  [phone]  Name: LAKEDRA DISCHER MRN: [id number] Date of Birth: [birth year]

## 2016-01-10 ENCOUNTER — Encounter (HOSPITAL_COMMUNITY): Payer: Self-pay | Admitting: Occupational Therapy

## 2016-01-10 ENCOUNTER — Ambulatory Visit (HOSPITAL_COMMUNITY): Payer: BLUE CROSS/BLUE SHIELD | Attending: Neurology | Admitting: Occupational Therapy

## 2016-01-10 DIAGNOSIS — R2681 Unsteadiness on feet: Secondary | ICD-10-CM | POA: Diagnosis present

## 2016-01-10 DIAGNOSIS — R29898 Other symptoms and signs involving the musculoskeletal system: Secondary | ICD-10-CM | POA: Diagnosis not present

## 2016-01-10 DIAGNOSIS — M6281 Muscle weakness (generalized): Secondary | ICD-10-CM | POA: Diagnosis present

## 2016-01-10 DIAGNOSIS — R296 Repeated falls: Secondary | ICD-10-CM | POA: Diagnosis present

## 2016-01-10 DIAGNOSIS — R2689 Other abnormalities of gait and mobility: Secondary | ICD-10-CM | POA: Diagnosis present

## 2016-01-10 NOTE — Therapy (Signed)
[place], [place], [address] Phone: [phone]   Fax:  [phone]  Occupational Therapy Treatment  Patient Details  Name: Rachel [name] MRN: [id number] Date of Birth: 07/02/1950 Referring Provider: Antony Contras, MD  Encounter Date: 01/10/2016      OT End of Session - 01/10/16 1209    Visit Number 2   Number of Visits 8   Date for OT Re-Evaluation 01/30/16   Authorization Type BCBS   OT Start Time 1042  pt arrived late   OT Stop Time 1117   OT Time Calculation (min) 35 min   Activity Tolerance Patient tolerated treatment well;No increased pain   Behavior During Therapy Flat affect      Past Medical History:  Diagnosis Date  . Blindness   . Diabetes mellitus type I ([place])   . GERD (gastroesophageal reflux disease)   . Hyperlipidemia   . Paroxysmal atrial fibrillation (HCC)   . Schizophrenia ([place])   . Systemic hypertension   . Vertigo     Past Surgical History:  Procedure Laterality Date  . BRAIN TUMOR EXCISION    . NM MYOCAR PERF WALL MOTION  02/01/2007   no significant ischemia  . US ECHOCARDIOGRAPHY  12/20/2003   mild mitral annular ca+,mild MR,TR,PI,AOV mildly sclerotic    There were no vitals filed for this visit.      Subjective Assessment - 01/10/16 1206    Subjective  S: The left. (when asked which hand was more difficult with pinch task)   Currently in Pain? No/denies            [facility] OT Assessment - 01/10/16 1042      Assessment   Diagnosis Late effects of CVA     Precautions   Precautions Fall                  OT Treatments/Exercises (OP) - 01/10/16 1206      Exercises   Exercises Hand     Hand Exercises   Theraputty Flatten;Grip   Theraputty - Flatten yellow   Theraputty - Grip yellow-pronated   Hand Gripper with Large Beads L: all beads 11#; R: all beads 15#   Sponges Pt completed pinch task using 3 point pinch and yellow resistive  clothespin to grasp high resistance sponge and place in bucket. Pt completed 8 sponges with right hand, 7 with left hand. OT guiding clothespin to sponge. Blue sponges used for vision                  OT Short Term Goals - 01/10/16 1211      OT SHORT TERM GOAL #1   Title Patient/Husband will be educated on a HEP and/or adaptive devices; if needed  to increase functional performance during daily tasks.    Time 4   Period Weeks   Status On-going     OT SHORT TERM GOAL #2   Title Patient will increase bilateral grip strength by 20# and pinch strength by 5# to increase ability to complete fine motor tasks such as self feeding with decreased difficulty.   Time 4   Period Weeks   Status On-going     OT SHORT TERM GOAL #3   Title Patient will increase completion time of motor movements during daily tasks by 25%.   Time 4   Period Weeks   Status On-going  Plan - 01/10/16 1209    Clinical Impression Statement A: Initiated grip and pinch strengthening this session. Pt requires max verbal cuing and min tactile facilitation for task completion. Pt reports she is able to see the blue sponges the best. Increased time required for task completion.    Plan P: Continue with grip and pinch strengthening, add pinch task with theraputty.    Consulted and Agree with Plan of Care Patient;Family member/caregiver   Family Member Consulted Husband      Patient will benefit from skilled therapeutic intervention in order to improve the following deficits and impairments:  Decreased strength, Decreased coordination  Visit Diagnosis: Other symptoms and signs involving the musculoskeletal system    Problem List Patient Active Problem List   Diagnosis Date Noted  . 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 Blind   . Schizophrenia ([place])   . Diabetes mellitus type 2 in nonobese (HCC)   . Acute encephalopathy   . Brain mass   . Subarachnoid hemorrhage following injury, no  loss of consciousness ([place]) 09/18/2015  . SAH (subarachnoid hemorrhage) ([place]) 09/17/2015  . Facial droop 12/04/2013  . 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 Blind in both eyes 05/31/2013  . HTN (hypertension) 10/11/2012  . PAF (paroxysmal atrial fibrillation) ([place]) 10/11/2012  . Dyslipidemia 10/11/2012  . H/O brain tumor 10/11/2012  . Insomnia due to mental disorder 12/29/2011  . Libido, decreased 12/29/2011  . Depression 04/14/2011   Guadelupe Sabin, OTR/L  [phone] 01/10/2016, 12:12 PM  [place] [address] [place], [place], [address] Phone: [phone]   Fax:  [phone]  Name: Rachel [name] MRN: [id number] Date of Birth: 1951-01-30

## 2016-01-15 ENCOUNTER — Ambulatory Visit (HOSPITAL_COMMUNITY): Payer: BLUE CROSS/BLUE SHIELD

## 2016-01-15 ENCOUNTER — Encounter (HOSPITAL_COMMUNITY): Payer: Self-pay

## 2016-01-15 DIAGNOSIS — R2689 Other abnormalities of gait and mobility: Secondary | ICD-10-CM

## 2016-01-15 DIAGNOSIS — R29898 Other symptoms and signs involving the musculoskeletal system: Secondary | ICD-10-CM

## 2016-01-15 DIAGNOSIS — R2681 Unsteadiness on feet: Secondary | ICD-10-CM

## 2016-01-15 DIAGNOSIS — M6281 Muscle weakness (generalized): Secondary | ICD-10-CM

## 2016-01-15 DIAGNOSIS — R296 Repeated falls: Secondary | ICD-10-CM

## 2016-01-15 NOTE — Therapy (Signed)
[place], [place], [address] Phone: [phone]   Fax:  [phone]  Physical Therapy Treatment  Patient Details  Name: Rachel [name] MRN: [id number] Date of Birth: 03/04/1950 Referring Provider: Garvin Fila   Encounter Date: 01/15/2016      PT End of Session - 01/15/16 1510    Visit Number 3   Number of Visits 16   Date for PT Re-Evaluation 01/27/16   Authorization Type BCBS Other    Authorization Time Period 12/30/15 to 02/29/16   Authorization - Visit Number 3   Authorization - Number of Visits 12   PT Start Time [id number]   PT Stop Time 1512   PT Time Calculation (min) 41 min   Equipment Utilized During Treatment Gait belt   Activity Tolerance Patient tolerated treatment well   Behavior During Therapy Flat affect      Past Medical History:  Diagnosis Date  . Blindness   . Diabetes mellitus type I ([place])   . GERD (gastroesophageal reflux disease)   . Hyperlipidemia   . Paroxysmal atrial fibrillation (HCC)   . Schizophrenia ([place])   . Systemic hypertension   . Vertigo     Past Surgical History:  Procedure Laterality Date  . BRAIN TUMOR EXCISION    . NM MYOCAR PERF WALL MOTION  02/01/2007   no significant ischemia  . US ECHOCARDIOGRAPHY  12/20/2003   mild mitral annular ca+,mild MR,TR,PI,AOV mildly sclerotic    There were no vitals filed for this visit.      Subjective Assessment - 01/15/16 1430    Subjective Pt arrived with her husband today.  She reports she has some hand fatigue following OT today.     Pertinent History HTN, A-fib, DM, history of brain tumor and excision, facial palsy, SAH, partial sight L eye otherwise blind, schizophrenic    Patient Stated Goals improve balance and walking    Currently in Pain? No/denies           Balance Exercises - 01/15/16 1517      Balance Exercises: Standing   Standing Eyes Opened Narrow base of support (BOS);5 reps;30 secs   Sit to  Stand Time 10x with safety reminder for back of LE and reaching      Balance Exercises: Seated   Dynamic Sitting Upper extremity support - 2;No upper extremity support;Lateral weight shift;Reaching outside base of support  on Dynadisc   Other Seated Exercises Complete on dynadisc:  sitting tall to reduce Rt lean, sitting tall with UE flexion, weight shifting, sitting tall for 2 minutes with tactile/verbal cueing              PT Short Term Goals - 12/30/15 1501      PT SHORT TERM GOAL #1   Title Patient to be able to complete all bed mobility and transfers with min assist in order to improve mobility at home    Time 4   Period Weeks   Status New     PT SHORT TERM GOAL #2   Title Patient to be able to complete all sit to stand transfers with good control and without crashing down to the seat in order to improve mobility and safety    Time 4   Period Weeks   Status New     PT SHORT TERM GOAL #3   Title Patient to be able sit unsupported statically for at least 15 minutes in order to show improved core strength  and allow her to sit at the table to eat a short meal    Time 4   Period Weeks   Status New     PT SHORT TERM GOAL #4   Title Patient to be able to stand unsupported statically for at least 5 minutes in order to show improvement of condition and improved balance    Time 4   Period Weeks   Status New     PT SHORT TERM GOAL #5   Title Patient to be able to consistently perform HEP with her husband's help in order to facilitate improvement in condition    Time 1   Period Weeks   Status New           PT Long Term Goals - 12/30/15 1506      PT LONG TERM GOAL #1   Title Patient to be modified independent in all bed mobility and functional transfers in order to improve function and reduce caregiver burden at home    Time 8   Period Weeks   Status New     PT LONG TERM GOAL #2   Title Patient to be able to sit unsupported for 30 minutes in order to show improved  core/trunk strength and allow her to sit and eat a long meal    Time 8   Period Weeks   Status New     PT LONG TERM GOAL #3   Title Patient to be able to statically stand unsupported for 15 minutes to show improved functional strength and balance, reduced fall risk    Time 8   Period Weeks   Status New     PT LONG TERM GOAL #4   Title Patient to be able to ambulate with LRAD and no assistance from spouse beyond min guard in order to show improved general mobility with reduced fall risk    Time 8   Period Weeks   Status New     PT LONG TERM GOAL #5   Title Patient to demonstrate functional strength at least 4+/5 in all tested groups in order to improve mobility and balance    Time 8   Period Weeks   Status New               Plan - 01/15/16 1511    Clinical Impression Statement Following discussion with DPT session foucs on improving core strengthening and balance training.  Therapist facilitation required through session for safety to reduce risk of fall.  Reviewed safety precautions with husband who reports verbal understanding.  Pt required verbal and tactile cueing through session to reduce Rt lean and improve posture through session with notable difficulty wtih motor control.  EOS pt reports limited by fatigue, no reports of pain through session.   Rehab Potential Good   Clinical Impairments Affecting Rehab Potential supportive spouse, severity of impairment   PT Frequency 2x / week   PT Duration 8 weeks   PT Treatment/Interventions ADLs/Self Care Home Management;Biofeedback;DME Instruction;Gait training;Stair training;Functional mobility training;Therapeutic activities;Therapeutic exercise;Balance training;Neuromuscular re-education;Patient/family education;Manual techniques;Energy conservation;Taping   PT Next Visit Plan Next session focus on core strengthening and balance training.  Monitor participation.  Have discussion with husband next session pertaining to need for  speech therapy   PT Home Exercise Plan eval: lateral weight shifts, unsupported sitting, slow sit to stand/stand to sit    Recommended Other Services possible Speech referral following discussion with DPT      Patient will benefit from skilled therapeutic  intervention in order to improve the following deficits and impairments:  Abnormal gait, Dizziness, Improper body mechanics, Pain, Decreased coordination, Decreased mobility, Postural dysfunction, Decreased activity tolerance, Decreased endurance, Decreased strength, Decreased balance, Decreased safety awareness, Difficulty walking  Visit Diagnosis: Other symptoms and signs involving the musculoskeletal system  Muscle weakness (generalized)  Unsteadiness on feet  Other abnormalities of gait and mobility  Repeated falls     Problem List Patient Active Problem List   Diagnosis Date Noted  . 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 Blind   . Schizophrenia ([place])   . Diabetes mellitus type 2 in nonobese (HCC)   . Acute encephalopathy   . Brain mass   . Subarachnoid hemorrhage following injury, no loss of consciousness ([place]) 09/18/2015  . SAH (subarachnoid hemorrhage) ([place]) 09/17/2015  . Facial droop 12/04/2013  . 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 Blind in both eyes 05/31/2013  . HTN (hypertension) 10/11/2012  . PAF (paroxysmal atrial fibrillation) ([place]) 10/11/2012  . Dyslipidemia 10/11/2012  . H/O brain tumor 10/11/2012  . Insomnia due to mental disorder 12/29/2011  . Libido, decreased 12/29/2011  . Depression 04/14/2011   Ihor Austin, [place]; [place]  Aldona Lento 01/15/2016, 4:05 PM  [place], [place], [address] Phone: [phone]   Fax:  [phone]  Name: Rachel [name] MRN: [id number] Date of Birth: Dec 05, 1950

## 2016-01-15 NOTE — Therapy (Signed)
[facility] [place], [place], [address] Phone: [phone]   Fax:  [phone]  Occupational Therapy Treatment  Patient Details  Name: Rachel [name] MRN: [id number] Date of Birth: 11-14-1950 Referring Provider: Antony Contras, MD  Encounter Date: 01/15/2016      OT End of Session - 01/15/16 1624    Visit Number 3   Number of Visits 8   Date for OT Re-Evaluation 01/30/16   Authorization Type BCBS   OT Start Time [id number]  Pt arrived late   OT Stop Time 1430   OT Time Calculation (min) 35 min   Activity Tolerance Patient tolerated treatment well;No increased pain   Behavior During Therapy Flat affect      Past Medical History:  Diagnosis Date  . Blindness   . Diabetes mellitus type I ([place])   . GERD (gastroesophageal reflux disease)   . Hyperlipidemia   . Paroxysmal atrial fibrillation (HCC)   . Schizophrenia ([place])   . Systemic hypertension   . Vertigo     Past Surgical History:  Procedure Laterality Date  . BRAIN TUMOR EXCISION    . NM MYOCAR PERF WALL MOTION  02/01/2007   no significant ischemia  . US ECHOCARDIOGRAPHY  12/20/2003   mild mitral annular ca+,mild MR,TR,PI,AOV mildly sclerotic    There were no vitals filed for this visit.                    OT Treatments/Exercises (OP) - 01/15/16 1411      Exercises   Exercises Hand     Hand Exercises   Theraputty Flatten;Roll;Grip;Pinch   Theraputty - Flatten yellow   Theraputty - Roll yellow   Theraputty - Grip yellow-pronated   Theraputty - Pinch yellow- 3 point   Other Hand Exercises Using PVC pipe patient pushed circles into yellow theraputty with therapist providing physical assist for cues.              Balance Exercises - 01/15/16 1517      Balance Exercises: Standing   Standing Eyes Opened Narrow base of support (BOS);5 reps;30 secs   Sit to Stand Time 10x with safety reminder for back of LE and reaching      Balance Exercises: Seated   Dynamic Sitting Upper extremity support - 2;No upper extremity support;Lateral weight shift;Reaching outside base of support  on Dynadisc   Other Seated Exercises Complete on dynadisc:  sitting tall to reduce Rt lean, sitting tall with UE flexion, weight shifting, sitting tall for 2 minutes with tactile/verbal cueing              OT Short Term Goals - 01/10/16 1211      OT SHORT TERM GOAL #1   Title Patient/Husband will be educated on a HEP and/or adaptive devices; if needed  to increase functional performance during daily tasks.    Time 4   Period Weeks   Status On-going     OT SHORT TERM GOAL #2   Title Patient will increase bilateral grip strength by 20# and pinch strength by 5# to increase ability to complete fine motor tasks such as self feeding with decreased difficulty.   Time 4   Period Weeks   Status On-going     OT SHORT TERM GOAL #3   Title Patient will increase completion time of motor movements during daily tasks by 25%.   Time 4   Period Weeks   Status On-going  Plan - 01/15/16 1625    Clinical Impression Statement A: Husband reports that built up utensil and universal cuff did not help with self feeding at home. Husband states that with self feeding she had difficulty both with holding the utensil plus bringing it to her mouth. During session, patient demonstrated adequate strength to hold onto utensils. Pt requires increased time to process as well as hand over hand assist for guidance due to vision during session.   Plan P: Use theraputty and utensil to simulate self feeding.      Patient will benefit from skilled therapeutic intervention in order to improve the following deficits and impairments:  Decreased strength, Decreased coordination  Visit Diagnosis: Other symptoms and signs involving the musculoskeletal system    Problem List Patient Active Problem List   Diagnosis Date Noted  .  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 Blind   . Schizophrenia ([place])   . Diabetes mellitus type 2 in nonobese (HCC)   . Acute encephalopathy   . Brain mass   . Subarachnoid hemorrhage following injury, no loss of consciousness ([place]) 09/18/2015  . SAH (subarachnoid hemorrhage) ([place]) 09/17/2015  . Facial droop 12/04/2013  . 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 Blind in both eyes 05/31/2013  . HTN (hypertension) 10/11/2012  . PAF (paroxysmal atrial fibrillation) ([place]) 10/11/2012  . Dyslipidemia 10/11/2012  . H/O brain tumor 10/11/2012  . Insomnia due to mental disorder 12/29/2011  . Libido, decreased 12/29/2011  . Depression 04/14/2011   Ailene Ravel, OTR/L,CBIS  [phone]  01/15/2016, 4:28 PM  [facility] [address] [place], [place], [address] Phone: [phone]   Fax:  [phone]  Name: Rachel [name] MRN: [id number] Date of Birth: 10-08-1950

## 2016-01-17 ENCOUNTER — Encounter (HOSPITAL_COMMUNITY): Payer: Self-pay

## 2016-01-17 ENCOUNTER — Ambulatory Visit (HOSPITAL_COMMUNITY): Payer: BLUE CROSS/BLUE SHIELD

## 2016-01-17 DIAGNOSIS — R29898 Other symptoms and signs involving the musculoskeletal system: Secondary | ICD-10-CM

## 2016-01-17 DIAGNOSIS — R296 Repeated falls: Secondary | ICD-10-CM

## 2016-01-17 DIAGNOSIS — R2681 Unsteadiness on feet: Secondary | ICD-10-CM

## 2016-01-17 DIAGNOSIS — R2689 Other abnormalities of gait and mobility: Secondary | ICD-10-CM

## 2016-01-17 DIAGNOSIS — M6281 Muscle weakness (generalized): Secondary | ICD-10-CM

## 2016-01-17 NOTE — Therapy (Addendum)
[facility] [place], [place], [address] Phone: [phone]   Fax:  [phone]  Occupational Therapy Treatment  Patient Details  Name: Rachel [name] MRN: [id number] Date of Birth: 23-Jan-1951 Referring Provider: Antony Contras, MD  Encounter Date: 01/17/2016      OT End of Session - 01/17/16 1515    Visit Number 4   Number of Visits 8   Date for OT Re-Evaluation 01/30/16   Authorization Type BCBS   OT Start Time 1350   OT Stop Time 1430   OT Time Calculation (min) 40 min   Activity Tolerance Patient tolerated treatment well   Behavior During Therapy Flat affect      Past Medical History:  Diagnosis Date  . Blindness   . Diabetes mellitus type I ([place])   . GERD (gastroesophageal reflux disease)   . Hyperlipidemia   . Paroxysmal atrial fibrillation (HCC)   . Schizophrenia ([place])   . Systemic hypertension   . Vertigo     Past Surgical History:  Procedure Laterality Date  . BRAIN TUMOR EXCISION    . NM MYOCAR PERF WALL MOTION  02/01/2007   no significant ischemia  . US ECHOCARDIOGRAPHY  12/20/2003   mild mitral annular ca+,mild MR,TR,PI,AOV mildly sclerotic    There were no vitals filed for this visit.      Subjective Assessment - 01/17/16 1443    Subjective  S: I like [place].   Currently in Pain? No/denies            [facility] OT Assessment - 01/17/16 1444      Assessment   Diagnosis Late effects of CVA     Precautions   Precautions Fall                  OT Treatments/Exercises (OP) - 01/17/16 1444      Exercises   Exercises Hand     Hand Exercises   Other Hand Exercises Patient completed functional reaching task with yellow resistive clothespins a 3 point pinch strength as she placed pins on vertical pole. Did require some assistance for accuracy when reaching for the pole as well as verbal cues for posture and technique. Patient then removed all with only some verbal  cueing.   Other Hand Exercises Therapist cut cubes of red theraputty while patient held fork with built up foam for better grip. Patient then stabbed each piece of putty and either brough it towards her mouth or reached out to drop in large bowl . patient did require some physical cueing to stab putty piece as well as lifting arm high enough to pass the edge of the bowl.                   OT Short Term Goals - 01/10/16 1211      OT SHORT TERM GOAL #1   Title Patient/Husband will be educated on a HEP and/or adaptive devices; if needed  to increase functional performance during daily tasks.    Time 4   Period Weeks   Status On-going     OT SHORT TERM GOAL #2   Title Patient will increase bilateral grip strength by 20# and pinch strength by 5# to increase ability to complete fine motor tasks such as self feeding with decreased difficulty.   Time 4   Period Weeks   Status On-going     OT SHORT TERM GOAL #3   Title Patient will increase completion  time of motor movements during daily tasks by 25%.   Time 4   Period Weeks   Status On-going                  Plan - 01/17/16 1519    Clinical Impression Statement A: pt was more interactive with therapist this session. Less processing time with questions needed. Patient attempted more reaching and movement tasks with RUE. Husband reports that patient has difficulty stabbing food on plate and he feeds her. Patient states that husband thinks he needed to feed her. Suggested just providing guidance for her to stab her food then let her bring it to her mouth. Also suggested that hudband bring something in for her to eat during her therapy session so we can assess how she's doing during an actual food activity.    PLAN:     Continue to provide physical and verbal guidance during tasks due to visual impairment. Continue with handgripper task to increase grip strength. Complete resistive clothespin for functional reaching and pinch  strength starting with red.  Patient will benefit from skilled therapeutic intervention in order to improve the following deficits and impairments:  Decreased strength, Decreased coordination  Visit Diagnosis: Other symptoms and signs involving the musculoskeletal system    Problem List Patient Active Problem List   Diagnosis Date Noted  . 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 Blind   . Schizophrenia ([place])   . Diabetes mellitus type 2 in nonobese (HCC)   . Acute encephalopathy   . Brain mass   . Subarachnoid hemorrhage following injury, no loss of consciousness ([place]) 09/18/2015  . SAH (subarachnoid hemorrhage) ([place]) 09/17/2015  . Facial droop 12/04/2013  . 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 Blind in both eyes 05/31/2013  . HTN (hypertension) 10/11/2012  . PAF (paroxysmal atrial fibrillation) ([place]) 10/11/2012  . Dyslipidemia 10/11/2012  . H/O brain tumor 10/11/2012  . Insomnia due to mental disorder 12/29/2011  . Libido, decreased 12/29/2011  . Depression 04/14/2011   Ailene Ravel, OTR/L,CBIS  [phone]  01/17/2016, 4:15 PM  [facility] [address] [place], [place], [address] Phone: [phone]   Fax:  [phone]  Name: Rachel [name] MRN: [id number] Date of Birth: 07/30/1950

## 2016-01-17 NOTE — Therapy (Signed)
[place], [place], [address] Phone: [phone]   Fax:  [phone]  Physical Therapy Treatment  Patient Details  Name: Rachel [name] MRN: [id number] Date of Birth: 1950/11/18 Referring Provider: Garvin Fila   Encounter Date: 01/17/2016      PT End of Session - 01/17/16 1515    Visit Number 4   Number of Visits 16   Date for PT Re-Evaluation 01/27/16   Authorization Type BCBS Other    Authorization Time Period 12/30/15 to 02/29/16   Authorization - Visit Number 4   Authorization - Number of Visits 12   PT Start Time [id number]   PT Stop Time 1515   PT Time Calculation (min) 40 min   Equipment Utilized During Treatment Gait belt   Activity Tolerance Patient tolerated treatment well   Behavior During Therapy Flat affect      Past Medical History:  Diagnosis Date  . Blindness   . Diabetes mellitus type I ([place])   . GERD (gastroesophageal reflux disease)   . Hyperlipidemia   . Paroxysmal atrial fibrillation (HCC)   . Schizophrenia ([place])   . Systemic hypertension   . Vertigo     Past Surgical History:  Procedure Laterality Date  . BRAIN TUMOR EXCISION    . NM MYOCAR PERF WALL MOTION  02/01/2007   no significant ischemia  . US ECHOCARDIOGRAPHY  12/20/2003   mild mitral annular ca+,mild MR,TR,PI,AOV mildly sclerotic    There were no vitals filed for this visit.      Subjective Assessment - 01/17/16 1726    Subjective Pt stated she if feeling "okay" today, a little tired following OT   Patient Stated Goals improve balance and walking    Currently in Pain? No/denies             [facility] Adult PT Treatment/Exercise - 01/17/16 1718      Ambulation/Gait   Ambulation/Gait Yes   Ambulation/Gait Assistance 4: Min assist   Ambulation/Gait Assistance Details Cueing to improve stride length and posture   Ambulation Distance (Feet) 30 Feet   Assistive device 1 person hand held assist   Gait  Pattern Decreased stride length;Shuffle;Trunk flexed;Poor foot clearance - left;Poor foot clearance - right   Ambulation Surface Level;Indoor     Knee/Hip Exercises: Standing   Gait Training 30 ft 1 HHA; cueing for posture and to increase stride length   Other Standing Knee Exercises in // bars:  Gait training 3RT with cueing for posture and to increase stride length (encouraged husband to include cueing with ADLs)     Hand Exercises   Other Hand Exercises Patient completed functional reaching task with yellow resistive clothespins a 3 point pinch strength as she placed pins on vertical pole. Did require some assistance for accuracy when reaching for the pole as well as verbal cues for posture and technique. Patient then removed all with only some verbal cueing.   Other Hand Exercises Therapist cut cubes of red theraputty while patient held fork with built up foam for better grip. Patient then stabbed each piece of putty and either brough it towards her mouth or reached out to drop in large bowl . patient did require some physical cueing to stab putty piece as well as lifting arm high enough to pass the edge of the bowl.              Balance Exercises - 01/17/16 1722      Balance Exercises:  Standing   Standing Eyes Opened Narrow base of support (BOS);5 reps;30 secs  in // bars with intermittent HHA as needed   Partial Tandem Stance 3 reps;15 secs;Eyes open  cueing to find BOS, improve posture to reduce lean to Rt   Retro Gait 2 reps  in // bars   Marching Limitations alternating 10x in // bars   Sit to Stand Time 10x with safety reminder for back of LE and reaching      Balance Exercises: Seated   Dynamic Sitting Upper extremity support - 2;No upper extremity support;Lateral weight shift;Reaching outside base of support   Other Seated Exercises Complete on dynadisc:  sitting tall to reduce Rt lean, sitting tall with UE flexion, weight shifting, sitting tall for 2 minutes with  tactile/verbal cueing            PT Education - 01/17/16 1717    Education provided Yes   Education Details Discussed referral for speech to assist with memory skills and speech   Person(s) Educated Spouse   Methods Explanation   Comprehension Verbalized understanding;Returned demonstration          PT Short Term Goals - 12/30/15 1501      PT SHORT TERM GOAL #1   Title Patient to be able to complete all bed mobility and transfers with min assist in order to improve mobility at home    Time 4   Period Weeks   Status New     PT SHORT TERM GOAL #2   Title Patient to be able to complete all sit to stand transfers with good control and without crashing down to the seat in order to improve mobility and safety    Time 4   Period Weeks   Status New     PT SHORT TERM GOAL #3   Title Patient to be able sit unsupported statically for at least 15 minutes in order to show improved core strength and allow her to sit at the table to eat a short meal    Time 4   Period Weeks   Status New     PT SHORT TERM GOAL #4   Title Patient to be able to stand unsupported statically for at least 5 minutes in order to show improvement of condition and improved balance    Time 4   Period Weeks   Status New     PT SHORT TERM GOAL #5   Title Patient to be able to consistently perform HEP with her husband's help in order to facilitate improvement in condition    Time 1   Period Weeks   Status New           PT Long Term Goals - 12/30/15 1506      PT LONG TERM GOAL #1   Title Patient to be modified independent in all bed mobility and functional transfers in order to improve function and reduce caregiver burden at home    Time 8   Period Weeks   Status New     PT LONG TERM GOAL #2   Title Patient to be able to sit unsupported for 30 minutes in order to show improved core/trunk strength and allow her to sit and eat a long meal    Time 8   Period Weeks   Status New     PT LONG TERM  GOAL #3   Title Patient to be able to statically stand unsupported for 15 minutes to show improved functional strength and  balance, reduced fall risk    Time 8   Period Weeks   Status New     PT LONG TERM GOAL #4   Title Patient to be able to ambulate with LRAD and no assistance from spouse beyond min guard in order to show improved general mobility with reduced fall risk    Time 8   Period Weeks   Status New     PT LONG TERM GOAL #5   Title Patient to demonstrate functional strength at least 4+/5 in all tested groups in order to improve mobility and balance    Time 8   Period Weeks   Status New               Plan - 01/17/16 1703    Clinical Impression Statement Session focus on improving core strengtheing and balance training.  Pt with improved response time and able to follow 1 step command with min to moderate cueing required.  Discussed with husband if interested in received speech therapy to assist with respone and memory skills, husband stated they have enough going on right now but may consider later on.".  Progressed to gait training this session with cueing to increase stride length and for posture.  Continues to have notable difficulty wtih motor control.  Pt limited by fatigue through session required 3 seated rest breaks.  No reports of pain through session.   Rehab Potential Good   Clinical Impairments Affecting Rehab Potential supportive spouse, severity of impairment   PT Frequency 2x / week   PT Duration 8 weeks   PT Treatment/Interventions ADLs/Self Care Home Management;Biofeedback;DME Instruction;Gait training;Stair training;Functional mobility training;Therapeutic activities;Therapeutic exercise;Balance training;Neuromuscular re-education;Patient/family education;Manual techniques;Energy conservation;Taping   PT Next Visit Plan Next session focus on core strengthening and balance training.  Monitor participation.  Have discussion with husband next session  pertaining to need for speech therapy   PT Home Exercise Plan eval: lateral weight shifts, unsupported sitting, slow sit to stand/stand to sit    Recommended Other Services Following discussion with DPT for speech referral, discussion held with husband who is not interested at this time but may consider later on.        Patient will benefit from skilled therapeutic intervention in order to improve the following deficits and impairments:  Abnormal gait, Dizziness, Improper body mechanics, Pain, Decreased coordination, Decreased mobility, Postural dysfunction, Decreased activity tolerance, Decreased endurance, Decreased strength, Decreased balance, Decreased safety awareness, Difficulty walking  Visit Diagnosis: Other symptoms and signs involving the musculoskeletal system  Muscle weakness (generalized)  Unsteadiness on feet  Other abnormalities of gait and mobility  Repeated falls     Problem List Patient Active Problem List   Diagnosis Date Noted  . 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 Blind   . Schizophrenia ([place])   . Diabetes mellitus type 2 in nonobese (HCC)   . Acute encephalopathy   . Brain mass   . Subarachnoid hemorrhage following injury, no loss of consciousness ([place]) 09/18/2015  . SAH (subarachnoid hemorrhage) ([place]) 09/17/2015  . Facial droop 12/04/2013  . 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 Blind in both eyes 05/31/2013  . HTN (hypertension) 10/11/2012  . PAF (paroxysmal atrial fibrillation) ([place]) 10/11/2012  . Dyslipidemia 10/11/2012  . H/O brain tumor 10/11/2012  . Insomnia due to mental disorder 12/29/2011  . Libido, decreased 12/29/2011  . Depression 04/14/2011   Ihor Austin, [place]; [place]  Aldona Lento 01/17/2016, 5:26 PM  [facility]  [place], [place], [address] Phone: [phone]   Fax:  [phone]  Name: Rachel [name] MRN: [id number] Date of Birth: 28-Jul-1950

## 2016-01-20 ENCOUNTER — Ambulatory Visit (HOSPITAL_COMMUNITY): Payer: BLUE CROSS/BLUE SHIELD | Admitting: Specialist

## 2016-01-20 ENCOUNTER — Ambulatory Visit (HOSPITAL_COMMUNITY): Payer: BLUE CROSS/BLUE SHIELD | Admitting: Physical Therapy

## 2016-01-20 DIAGNOSIS — R2681 Unsteadiness on feet: Secondary | ICD-10-CM

## 2016-01-20 DIAGNOSIS — M6281 Muscle weakness (generalized): Secondary | ICD-10-CM

## 2016-01-20 DIAGNOSIS — R29898 Other symptoms and signs involving the musculoskeletal system: Secondary | ICD-10-CM

## 2016-01-20 DIAGNOSIS — R296 Repeated falls: Secondary | ICD-10-CM

## 2016-01-20 DIAGNOSIS — R2689 Other abnormalities of gait and mobility: Secondary | ICD-10-CM

## 2016-01-20 NOTE — Therapy (Signed)
[place], [place], [address] Phone: [phone]   Fax:  [phone]  Physical Therapy Treatment  Patient Details  Name: Rachel [name] MRN: [id number] Date of Birth: 1950/05/11 Referring Provider: Garvin Fila   Encounter Date: 01/20/2016      PT End of Session - 01/20/16 1517    Visit Number 5   Number of Visits 16   Date for PT Re-Evaluation 01/27/16   Authorization Type BCBS Other    Authorization Time Period 12/30/15 to 02/29/16   Authorization - Visit Number 5   Authorization - Number of Visits 12   PT Start Time [id number]   PT Stop Time 1506   PT Time Calculation (min) 43 min   Activity Tolerance Patient tolerated treatment well   Behavior During Therapy [facility] for tasks assessed/performed      Past Medical History:  Diagnosis Date  . Blindness   . Diabetes mellitus type I ([place])   . GERD (gastroesophageal reflux disease)   . Hyperlipidemia   . Paroxysmal atrial fibrillation (HCC)   . Schizophrenia ([place])   . Systemic hypertension   . Vertigo     Past Surgical History:  Procedure Laterality Date  . BRAIN TUMOR EXCISION    . NM MYOCAR PERF WALL MOTION  02/01/2007   no significant ischemia  . US ECHOCARDIOGRAPHY  12/20/2003   mild mitral annular ca+,mild MR,TR,PI,AOV mildly sclerotic    There were no vitals filed for this visit.      Subjective Assessment - 01/20/16 1509    Subjective Patient arrives with her husband, feeling "so-so" today. Her back has been bothering her recently, unable to accurarately rate on NPRS so based off of faces scale.    Pertinent History HTN, A-fib, DM, history of brain tumor and excision, facial palsy, SAH, partial sight L eye otherwise blind, schizophrenic    Patient Stated Goals improve balance and walking    Currently in Pain? Other (Comment)  based on faces/behaviors    Pain Score 4   unable to comment on number, DPT estimated based on faces/behavior     Pain Location Back   Pain Orientation Lower                         [facility] Adult PT Treatment/Exercise - 01/20/16 1511      Knee/Hip Exercises: Aerobic   Nustep Nustep level 0, UE/LE, min-mod(A) for continous motion   mod cues      Knee/Hip Exercises: Standing   Other Standing Knee Exercises standing posture activity with cross-midline reaching, min guard, mod cues for posture; perforemd as co-treat with OT and not included in PT billing  approx 5-8 minutes, not included in billing      Knee/Hip Exercises: Seated   Other Seated Knee/Hip Exercises static sitting on air pad with cues for posture; lateral reaching, cross-midline trunk rotation, forward flexion at 3 angles performed with cones/ball today, min-mod cues                 PT Education - 01/20/16 1517    Education provided Yes   Education Details purpose for all functional activities performed today    Person(s) Educated Patient;Spouse   Methods Explanation   Comprehension Verbalized understanding          PT Short Term Goals - 12/30/15 1501      PT SHORT TERM GOAL #1   Title Patient to be able  to complete all bed mobility and transfers with min assist in order to improve mobility at home    Time 4   Period Weeks   Status New     PT SHORT TERM GOAL #2   Title Patient to be able to complete all sit to stand transfers with good control and without crashing down to the seat in order to improve mobility and safety    Time 4   Period Weeks   Status New     PT SHORT TERM GOAL #3   Title Patient to be able sit unsupported statically for at least 15 minutes in order to show improved core strength and allow her to sit at the table to eat a short meal    Time 4   Period Weeks   Status New     PT SHORT TERM GOAL #4   Title Patient to be able to stand unsupported statically for at least 5 minutes in order to show improvement of condition and improved balance    Time 4   Period Weeks   Status  New     PT SHORT TERM GOAL #5   Title Patient to be able to consistently perform HEP with her husband's help in order to facilitate improvement in condition    Time 1   Period Weeks   Status New           PT Long Term Goals - 12/30/15 1506      PT LONG TERM GOAL #1   Title Patient to be modified independent in all bed mobility and functional transfers in order to improve function and reduce caregiver burden at home    Time 8   Period Weeks   Status New     PT LONG TERM GOAL #2   Title Patient to be able to sit unsupported for 30 minutes in order to show improved core/trunk strength and allow her to sit and eat a long meal    Time 8   Period Weeks   Status New     PT LONG TERM GOAL #3   Title Patient to be able to statically stand unsupported for 15 minutes to show improved functional strength and balance, reduced fall risk    Time 8   Period Weeks   Status New     PT LONG TERM GOAL #4   Title Patient to be able to ambulate with LRAD and no assistance from spouse beyond min guard in order to show improved general mobility with reduced fall risk    Time 8   Period Weeks   Status New     PT LONG TERM GOAL #5   Title Patient to demonstrate functional strength at least 4+/5 in all tested groups in order to improve mobility and balance    Time 8   Period Weeks   Status New               Plan - 01/20/16 1519    Clinical Impression Statement Performed some co-treat activities with OT prior to PT session, approximately 5-8 minutes in duration and not included in billing for PT session; focus on static standing and reaching/grip strength activities with mod cues for posture, fatigue and lumbar pain noted. During formal PT session, introduced Nustep, which patient required min-mod(A) for continuous reciprocal motion; otherwise focused on core and lumbar mobility with ball/cone activities this session, min-mod cues for form and focus on returning to upright neutral posture.  Patient expressed fatigue  at end of session. Participation appears to be improving in general.    Rehab Potential Good   Clinical Impairments Affecting Rehab Potential supportive spouse, severity of impairment   PT Frequency 2x / week   PT Duration 8 weeks   PT Treatment/Interventions ADLs/Self Care Home Management;Biofeedback;DME Instruction;Gait training;Stair training;Functional mobility training;Therapeutic activities;Therapeutic exercise;Balance training;Neuromuscular re-education;Patient/family education;Manual techniques;Energy conservation;Taping   PT Next Visit Plan continue focus on core strength/stability and balance training. Continue Nustep. Monitor back pain.    PT Home Exercise Plan eval: lateral weight shifts, unsupported sitting, slow sit to stand/stand to sit    Consulted and Agree with Plan of Care Patient;Family member/caregiver      Patient will benefit from skilled therapeutic intervention in order to improve the following deficits and impairments:  Abnormal gait, Dizziness, Improper body mechanics, Pain, Decreased coordination, Decreased mobility, Postural dysfunction, Decreased activity tolerance, Decreased endurance, Decreased strength, Decreased balance, Decreased safety awareness, Difficulty walking  Visit Diagnosis: Other symptoms and signs involving the musculoskeletal system  Muscle weakness (generalized)  Unsteadiness on feet  Other abnormalities of gait and mobility  Repeated falls     Problem List Patient Active Problem List   Diagnosis Date Noted  . 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 Blind   . Schizophrenia ([place])   . Diabetes mellitus type 2 in nonobese (HCC)   . Acute encephalopathy   . Brain mass   . Subarachnoid hemorrhage following injury, no loss of consciousness ([place]) 09/18/2015  . SAH (subarachnoid hemorrhage) ([place]) 09/17/2015  . Facial droop 12/04/2013  . 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 Blind in both eyes 05/31/2013  . HTN (hypertension)  10/11/2012  . PAF (paroxysmal atrial fibrillation) ([place]) 10/11/2012  . Dyslipidemia 10/11/2012  . H/O brain tumor 10/11/2012  . Insomnia due to mental disorder 12/29/2011  . Libido, decreased 12/29/2011  . Depression 04/14/2011    Deniece Ree PT, DPT [place] [address] [place], [place], [address] Phone: [phone]   Fax:  [phone]  Name: Rachel [name] MRN: [id number] Date of Birth: [birth year]

## 2016-01-20 NOTE — Therapy (Signed)
[place], [place], [address] Phone: [phone]   Fax:  [phone]  Occupational Therapy Treatment  Patient Details  Name: Rachel [name] MRN: [id number] Date of Birth: 1950/06/30 Referring Provider: Antony Contras, MD  Encounter Date: 01/20/2016      OT End of Session - 01/20/16 1416    Visit Number 5   Number of Visits 8   Date for OT Re-Evaluation 01/30/16   Authorization Type BCBS   OT Start Time 1305   OT Stop Time 1345   OT Time Calculation (min) 40 min   Activity Tolerance Patient tolerated treatment well   Behavior During Therapy [facility] for tasks assessed/performed      Past Medical History:  Diagnosis Date  . Blindness   . Diabetes mellitus type I ([place])   . GERD (gastroesophageal reflux disease)   . Hyperlipidemia   . Paroxysmal atrial fibrillation (HCC)   . Schizophrenia ([place])   . Systemic hypertension   . Vertigo     Past Surgical History:  Procedure Laterality Date  . BRAIN TUMOR EXCISION    . NM MYOCAR PERF WALL MOTION  02/01/2007   no significant ischemia  . US ECHOCARDIOGRAPHY  12/20/2003   mild mitral annular ca+,mild MR,TR,PI,AOV mildly sclerotic    There were no vitals filed for this visit.      Subjective Assessment - 01/20/16 1415    Subjective  S:  It hurts my back (patient leaning significantly to right when seated, when faciltated to sit upright, patient complaining of back pain)   Currently in Pain? Yes   Pain Score --  unable to comment on number   Pain Location Back   Pain Orientation Lower   Pain Descriptors / Indicators Aching   Pain Type Acute pain   Pain Radiating Towards n/a   Pain Onset Other (comment)  unable to comment   Pain Frequency Intermittent   Aggravating Factors  sitting upright   Pain Relieving Factors leaning to right   Effect of Pain on Daily Activities decreased safety            [facility] OT Assessment - 01/20/16 0001      Assessment   Diagnosis Late effects of CVA     Precautions   Precautions Fall                  OT Treatments/Exercises (OP) - 01/20/16 0001      Exercises   Exercises --     Hand Exercises   Theraputty Flatten;Roll;Grip;Pinch   Theraputty - Flatten yellow   Theraputty - Roll yellow   Theraputty - Grip yellow-pronated and supinated   Sponges in hand manipulation up to 8 sponges with verbal guidance from therapist     Neurological Re-education Exercises   Weight Shifting Other   Weight-Shifting Exercises - Other while completing table top hand strengthening exercises, therapist provided constant min tactile faciltation and max verbal guidance to sit upright vs leaning to right.  Cotreatment with PT for 10', working on standing balance.  OT provided tactile cuing to chest and upper back to improve upright standing posture.                   OT Short Term Goals - 01/10/16 1211      OT SHORT TERM GOAL #1   Title Patient/Husband will be educated on a HEP and/or adaptive devices; if needed  to increase functional performance during daily tasks.  Time 4   Period Weeks   Status On-going     OT SHORT TERM GOAL #2   Title Patient will increase bilateral grip strength by 20# and pinch strength by 5# to increase ability to complete fine motor tasks such as self feeding with decreased difficulty.   Time 4   Period Weeks   Status On-going     OT SHORT TERM GOAL #3   Title Patient will increase completion time of motor movements during daily tasks by 25%.   Time 4   Period Weeks   Status On-going                  Plan - 01/20/16 1417    Clinical Impression Statement A:  Patient responsive to therapist cuing.  Cotreatment with PT to improve standing static balance.  seated balance - patient leans to right, with facilitation to pelvic region, alignment improves, however, patient requires constant verbal cuing and facilitation to return to upright  posture.    Plan P:  assess finger food accuracy for eating.        Patient will benefit from skilled therapeutic intervention in order to improve the following deficits and impairments:  Decreased strength, Decreased coordination  Visit Diagnosis: Other symptoms and signs involving the musculoskeletal system    Problem List Patient Active Problem List   Diagnosis Date Noted  . 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 Blind   . Schizophrenia ([place])   . Diabetes mellitus type 2 in nonobese (HCC)   . Acute encephalopathy   . Brain mass   . Subarachnoid hemorrhage following injury, no loss of consciousness ([place]) 09/18/2015  . SAH (subarachnoid hemorrhage) ([place]) 09/17/2015  . Facial droop 12/04/2013  . 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 Blind in both eyes 05/31/2013  . HTN (hypertension) 10/11/2012  . PAF (paroxysmal atrial fibrillation) ([place]) 10/11/2012  . Dyslipidemia 10/11/2012  . H/O brain tumor 10/11/2012  . Insomnia due to mental disorder 12/29/2011  . Libido, decreased 12/29/2011  . Depression 04/14/2011    Vangie Bicker, [place], OTR/L [phone]  01/20/2016, 2:29 PM  [facility] [address] [place], [place], [address] Phone: [phone]   Fax:  [phone]  Name: Rachel [name] MRN: [id number] Date of Birth: [birth year]

## 2016-01-23 ENCOUNTER — Ambulatory Visit (HOSPITAL_COMMUNITY): Payer: BLUE CROSS/BLUE SHIELD

## 2016-01-23 ENCOUNTER — Encounter (HOSPITAL_COMMUNITY): Payer: Self-pay

## 2016-01-23 DIAGNOSIS — R2681 Unsteadiness on feet: Secondary | ICD-10-CM

## 2016-01-23 DIAGNOSIS — R2689 Other abnormalities of gait and mobility: Secondary | ICD-10-CM

## 2016-01-23 DIAGNOSIS — R29898 Other symptoms and signs involving the musculoskeletal system: Secondary | ICD-10-CM | POA: Diagnosis not present

## 2016-01-23 DIAGNOSIS — M6281 Muscle weakness (generalized): Secondary | ICD-10-CM

## 2016-01-23 DIAGNOSIS — R296 Repeated falls: Secondary | ICD-10-CM

## 2016-01-23 NOTE — Therapy (Signed)
[place], [place], [address] Phone: [phone]   Fax:  [phone]  Occupational Therapy Treatment  Patient Details  Name: Rachel [name] MRN: [id number] Date of Birth: April 13, 1950 Referring Provider: Antony Contras, MD  Encounter Date: 01/23/2016      OT End of Session - 01/23/16 1443    Visit Number 6   Number of Visits 8   Date for OT Re-Evaluation 01/30/16   Authorization Type BCBS   OT Start Time 1350   OT Stop Time 1430   OT Time Calculation (min) 40 min   Activity Tolerance Patient tolerated treatment well   Behavior During Therapy [facility] for tasks assessed/performed      Past Medical History:  Diagnosis Date  . Blindness   . Diabetes mellitus type I ([place])   . GERD (gastroesophageal reflux disease)   . Hyperlipidemia   . Paroxysmal atrial fibrillation (HCC)   . Schizophrenia ([place])   . Systemic hypertension   . Vertigo     Past Surgical History:  Procedure Laterality Date  . BRAIN TUMOR EXCISION    . NM MYOCAR PERF WALL MOTION  02/01/2007   no significant ischemia  . US ECHOCARDIOGRAPHY  12/20/2003   mild mitral annular ca+,mild MR,TR,PI,AOV mildly sclerotic    There were no vitals filed for this visit.      Subjective Assessment - 01/23/16 1440    Subjective  S: What did you want me to do again?   Currently in Pain? No/denies            [facility] OT Assessment - 01/23/16 1441      Assessment   Diagnosis Late effects of CVA     Precautions   Precautions Fall                  OT Treatments/Exercises (OP) - 01/23/16 1416      Exercises   Exercises Hand     Hand Exercises   Hand Gripper with Large Beads L: All beads with gripper at 15# R: all beads with gripper at 20#   Other Hand Exercises pt completed functional reaching task with red and yellow resistive clothespins alternating between right and left hand. Patient required max verbal cues and mod physical  assist to complete.                   OT Short Term Goals - 01/10/16 1211      OT SHORT TERM GOAL #1   Title Patient/Husband will be educated on a HEP and/or adaptive devices; if needed  to increase functional performance during daily tasks.    Time 4   Period Weeks   Status On-going     OT SHORT TERM GOAL #2   Title Patient will increase bilateral grip strength by 20# and pinch strength by 5# to increase ability to complete fine motor tasks such as self feeding with decreased difficulty.   Time 4   Period Weeks   Status On-going     OT SHORT TERM GOAL #3   Title Patient will increase completion time of motor movements during daily tasks by 25%.   Time 4   Period Weeks   Status On-going                  Plan - 01/23/16 1443    Clinical Impression Statement A: Husband reports that patient has no difficulty eating finger food and does well with that. Patient  required phsyical cueing to maintain upright posture during table task. No lateral leaning noted. Patient was able to to complete handgripper activity with resistance increased.   Plan P: Continue with grip and pinch strengthening. Try having patient throw ball to increase functional arm movements.      Patient will benefit from skilled therapeutic intervention in order to improve the following deficits and impairments:  Decreased strength, Decreased coordination  Visit Diagnosis: Other symptoms and signs involving the musculoskeletal system    Problem List Patient Active Problem List   Diagnosis Date Noted  . 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 Blind   . Schizophrenia ([place])   . Diabetes mellitus type 2 in nonobese (HCC)   . Acute encephalopathy   . Brain mass   . Subarachnoid hemorrhage following injury, no loss of consciousness ([place]) 09/18/2015  . SAH (subarachnoid hemorrhage) ([place]) 09/17/2015  . Facial droop 12/04/2013  . 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 Blind in both eyes 05/31/2013  . HTN  (hypertension) 10/11/2012  . PAF (paroxysmal atrial fibrillation) ([place]) 10/11/2012  . Dyslipidemia 10/11/2012  . H/O brain tumor 10/11/2012  . Insomnia due to mental disorder 12/29/2011  . Libido, decreased 12/29/2011  . Depression 04/14/2011   Ailene Ravel, OTR/L,CBIS  [phone]  01/23/2016, 2:55 PM  [facility] [address] [place], [place], [address] Phone: [phone]   Fax:  [phone]  Name: Rachel [name] MRN: [id number] Date of Birth: 1950/06/01

## 2016-01-23 NOTE — Therapy (Signed)
[place], [place], [address] Phone: [phone]   Fax:  [phone]  Physical Therapy Treatment  Patient Details  Name: Rachel [name] MRN: [id number] Date of Birth: Oct 23, 1950 Referring Provider: Garvin Fila   Encounter Date: 01/23/2016      PT End of Session - 01/23/16 1807    Visit Number 6   Number of Visits 16   Date for PT Re-Evaluation 01/27/16   Authorization Type BCBS Other    Authorization Time Period 12/30/15 to 02/29/16   Authorization - Visit Number 6   Authorization - Number of Visits 12   PT Start Time [id number]   PT Stop Time 1513   PT Time Calculation (min) 38 min   Equipment Utilized During Treatment Gait belt   Activity Tolerance Patient tolerated treatment well;Patient limited by fatigue   Behavior During Therapy [facility] for tasks assessed/performed      Past Medical History:  Diagnosis Date  . Blindness   . Diabetes mellitus type I ([place])   . GERD (gastroesophageal reflux disease)   . Hyperlipidemia   . Paroxysmal atrial fibrillation (HCC)   . Schizophrenia ([place])   . Systemic hypertension   . Vertigo     Past Surgical History:  Procedure Laterality Date  . BRAIN TUMOR EXCISION    . NM MYOCAR PERF WALL MOTION  02/01/2007   no significant ischemia  . US ECHOCARDIOGRAPHY  12/20/2003   mild mitral annular ca+,mild MR,TR,PI,AOV mildly sclerotic    There were no vitals filed for this visit.      Subjective Assessment - 01/23/16 1806    Subjective Pt stated she feels a lot better than yesterday.  Husband reports she had a tooth pulled yesterday.  No reports of pain today.     Pertinent History HTN, A-fib, DM, history of brain tumor and excision, facial palsy, SAH, partial sight L eye otherwise blind, schizophrenic    Patient Stated Goals improve balance and walking    Currently in Pain? No/denies                 [facility] Adult PT Treatment/Exercise - 01/23/16 1818       Knee/Hip Exercises: Aerobic   Nustep Nustep level 0, UE/LE, min-mod(A) for continous motion      Knee/Hip Exercises: Standing   Gait Training 50ft x 2 with 1 HHA; cueing for posture (significant left lean) and to increase stride length   Other Standing Knee Exercises in // bars:  gait training with cueing for posture and to increase stride length   Other Standing Knee Exercises in // bars: alternating marching and knee flexion with HHA     Knee/Hip Exercises: Seated   Other Seated Knee/Hip Exercises static sitting on air pad with cues for posture; lateral reaching, cross-midline trunk rotation, forward flexion at 3 angles performed with cones/ball today, min-mod cues                 PT Short Term Goals - 12/30/15 1501      PT SHORT TERM GOAL #1   Title Patient to be able to complete all bed mobility and transfers with min assist in order to improve mobility at home    Time 4   Period Weeks   Status New     PT SHORT TERM GOAL #2   Title Patient to be able to complete all sit to stand transfers with good control and without crashing down to the seat  in order to improve mobility and safety    Time 4   Period Weeks   Status New     PT SHORT TERM GOAL #3   Title Patient to be able sit unsupported statically for at least 15 minutes in order to show improved core strength and allow her to sit at the table to eat a short meal    Time 4   Period Weeks   Status New     PT SHORT TERM GOAL #4   Title Patient to be able to stand unsupported statically for at least 5 minutes in order to show improvement of condition and improved balance    Time 4   Period Weeks   Status New     PT SHORT TERM GOAL #5   Title Patient to be able to consistently perform HEP with her husband's help in order to facilitate improvement in condition    Time 1   Period Weeks   Status New           PT Long Term Goals - 12/30/15 1506      PT LONG TERM GOAL #1   Title Patient to be modified  independent in all bed mobility and functional transfers in order to improve function and reduce caregiver burden at home    Time 8   Period Weeks   Status New     PT LONG TERM GOAL #2   Title Patient to be able to sit unsupported for 30 minutes in order to show improved core/trunk strength and allow her to sit and eat a long meal    Time 8   Period Weeks   Status New     PT LONG TERM GOAL #3   Title Patient to be able to statically stand unsupported for 15 minutes to show improved functional strength and balance, reduced fall risk    Time 8   Period Weeks   Status New     PT LONG TERM GOAL #4   Title Patient to be able to ambulate with LRAD and no assistance from spouse beyond min guard in order to show improved general mobility with reduced fall risk    Time 8   Period Weeks   Status New     PT LONG TERM GOAL #5   Title Patient to demonstrate functional strength at least 4+/5 in all tested groups in order to improve mobility and balance    Time 8   Period Weeks   Status New               Plan - 01/23/16 1808    Clinical Impression Statement Session focus on improving core strengthening/stabiltiy to assist with balance.  Began session on Nustep to improve UE/LE coordination with modate verbal and min tactile cueing to improve reciprocal motion.  Pt is improving with response time and following 1 step commands with decreased frequency though does continues to have delay.  Max cueing with gait training this session to improve posture with HHA, min-mod A to reduce risk of falls.  No reports of pain through session.    Rehab Potential Good   Clinical Impairments Affecting Rehab Potential supportive spouse, severity of impairment   PT Frequency 2x / week   PT Duration 8 weeks   PT Treatment/Interventions ADLs/Self Care Home Management;Biofeedback;DME Instruction;Gait training;Stair training;Functional mobility training;Therapeutic activities;Therapeutic exercise;Balance  training;Neuromuscular re-education;Patient/family education;Manual techniques;Energy conservation;Taping   PT Next Visit Plan continue focus on core strength/stability and balance training. Continue Nustep.  Monitor back pain.       Patient will benefit from skilled therapeutic intervention in order to improve the following deficits and impairments:  Abnormal gait, Dizziness, Improper body mechanics, Pain, Decreased coordination, Decreased mobility, Postural dysfunction, Decreased activity tolerance, Decreased endurance, Decreased strength, Decreased balance, Decreased safety awareness, Difficulty walking  Visit Diagnosis: Other symptoms and signs involving the musculoskeletal system  Muscle weakness (generalized)  Unsteadiness on feet  Other abnormalities of gait and mobility  Repeated falls     Problem List Patient Active Problem List   Diagnosis Date Noted  . 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 Blind   . Schizophrenia ([place])   . Diabetes mellitus type 2 in nonobese (HCC)   . Acute encephalopathy   . Brain mass   . Subarachnoid hemorrhage following injury, no loss of consciousness ([place]) 09/18/2015  . SAH (subarachnoid hemorrhage) ([place]) 09/17/2015  . Facial droop 12/04/2013  . 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 Blind in both eyes 05/31/2013  . HTN (hypertension) 10/11/2012  . PAF (paroxysmal atrial fibrillation) ([place]) 10/11/2012  . Dyslipidemia 10/11/2012  . H/O brain tumor 10/11/2012  . Insomnia due to mental disorder 12/29/2011  . Libido, decreased 12/29/2011  . Depression 04/14/2011   Ihor Austin, [place]; [place]  Aldona Lento 01/23/2016, 6:20 PM  [place], [place], [address] Phone: [phone]   Fax:  [phone]  Name: Rachel [name] MRN: [id number] Date of Birth: 04-Nov-1950

## 2016-01-27 ENCOUNTER — Ambulatory Visit (HOSPITAL_COMMUNITY): Payer: BLUE CROSS/BLUE SHIELD | Admitting: Physical Therapy

## 2016-01-27 ENCOUNTER — Ambulatory Visit (HOSPITAL_COMMUNITY): Payer: BLUE CROSS/BLUE SHIELD | Admitting: Specialist

## 2016-01-27 ENCOUNTER — Ambulatory Visit (HOSPITAL_COMMUNITY): Payer: BLUE CROSS/BLUE SHIELD

## 2016-01-27 DIAGNOSIS — R2681 Unsteadiness on feet: Secondary | ICD-10-CM

## 2016-01-27 DIAGNOSIS — R2689 Other abnormalities of gait and mobility: Secondary | ICD-10-CM

## 2016-01-27 DIAGNOSIS — R296 Repeated falls: Secondary | ICD-10-CM

## 2016-01-27 DIAGNOSIS — R29898 Other symptoms and signs involving the musculoskeletal system: Secondary | ICD-10-CM

## 2016-01-27 DIAGNOSIS — M6281 Muscle weakness (generalized): Secondary | ICD-10-CM

## 2016-01-27 NOTE — Therapy (Signed)
[place], [place], [address] Phone: [phone]   Fax:  [phone]  Occupational Therapy Treatment  Patient Details  Name: Rachel [name] MRN: [id number] Date of Birth: 02/15/1950 Referring Provider: Antony Contras, MD  Encounter Date: 01/27/2016      OT End of Session - 01/27/16 1556    Visit Number 7   Number of Visits 8   Date for OT Re-Evaluation 01/30/16   Authorization Type BCBS   OT Start Time [id number]   OT Stop Time 1429   OT Time Calculation (min) 44 min   Activity Tolerance Patient tolerated treatment well   Behavior During Therapy [facility] for tasks assessed/performed      Past Medical History:  Diagnosis Date  . Blindness   . Diabetes mellitus type I ([place])   . GERD (gastroesophageal reflux disease)   . Hyperlipidemia   . Paroxysmal atrial fibrillation (HCC)   . Schizophrenia ([place])   . Systemic hypertension   . Vertigo     Past Surgical History:  Procedure Laterality Date  . BRAIN TUMOR EXCISION    . NM MYOCAR PERF WALL MOTION  02/01/2007   no significant ischemia  . US ECHOCARDIOGRAPHY  12/20/2003   mild mitral annular ca+,mild MR,TR,PI,AOV mildly sclerotic    There were no vitals filed for this visit.      Subjective Assessment - 01/27/16 1628    Subjective  S:  I fold towels in half at home   Currently in Pain? No/denies            [facility] OT Assessment - 01/27/16 1559      Assessment   Diagnosis Late effects of CVA     Precautions   Precautions Fall                  OT Treatments/Exercises (OP) - 01/27/16 1559      ADLs   ADL Comments completed task of folding 3 washclothes and 3 towels seated at table, task used for bilateral upper extremity sustained activity tolerance and to improve functional reaching ability.  Patient able to fold 3 washclothes independently after set up.  Patient then attempted to fold 3 towels, however required mod pa due difficulty  seeing towel ends due to decreased contrast.       Exercises   Exercises Neurological Re-education;Hand;Wrist     Hand Exercises   Other Hand Exercises sponge activity to work on improved in hand manipulation:  right 7, 4, left 7, 8      Neurological Re-education Exercises   Other Exercises 1 throwing and catching volley ball 10 times underhand toss, 10 times chest press for improved bilateral arm movements and to improve shoulder range.  Patient demonstrated good form and improved available range with this activitiy this date.  Patient did not like catching the ball, as it bothered her chest, so husband caught the ball patient tossed and then tossed to this therapist who handed to patient.                    OT Short Term Goals - 01/10/16 1211      OT SHORT TERM GOAL #1   Title Patient/Husband will be educated on a HEP and/or adaptive devices; if needed  to increase functional performance during daily tasks.    Time 4   Period Weeks   Status On-going     OT SHORT TERM GOAL #2   Title Patient will  increase bilateral grip strength by 20# and pinch strength by 5# to increase ability to complete fine motor tasks such as self feeding with decreased difficulty.   Time 4   Period Weeks   Status On-going     OT SHORT TERM GOAL #3   Title Patient will increase completion time of motor movements during daily tasks by 25%.   Time 4   Period Weeks   Status On-going                  Plan - 01/27/16 1556    Clinical Impression Statement A:  Patient much more alert this session than last with this therapist, sitting balance and available reach greatly improved, as well.  Patient complained of pain when catching ball, so task was modified.  Patient had difficulty folding large towels due to decreased contrast availability with white on white towel.     Plan P:  complete reassessment, complete functional activity of straigthening overhead cabinet for improved functional  reaching availability.       Patient will benefit from skilled therapeutic intervention in order to improve the following deficits and impairments:  Decreased strength, Decreased coordination  Visit Diagnosis: Other symptoms and signs involving the musculoskeletal system    Problem List Patient Active Problem List   Diagnosis Date Noted  . 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 Blind   . Schizophrenia ([place])   . Diabetes mellitus type 2 in nonobese (HCC)   . Acute encephalopathy   . Brain mass   . Subarachnoid hemorrhage following injury, no loss of consciousness ([place]) 09/18/2015  . SAH (subarachnoid hemorrhage) ([place]) 09/17/2015  . Facial droop 12/04/2013  . 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 Blind in both eyes 05/31/2013  . HTN (hypertension) 10/11/2012  . PAF (paroxysmal atrial fibrillation) ([place]) 10/11/2012  . Dyslipidemia 10/11/2012  . H/O brain tumor 10/11/2012  . Insomnia due to mental disorder 12/29/2011  . Libido, decreased 12/29/2011  . Depression 04/14/2011    Vangie Bicker, [place], OTR/L [phone]  01/27/2016, 4:29 PM  [place] [address] [place], [place], [address] Phone: [phone]   Fax:  [phone]  Name: Rachel [name] MRN: [id number] Date of Birth: 05/14/1950

## 2016-01-27 NOTE — Therapy (Signed)
[place], [place], [address] Phone: [phone]   Fax:  [phone]  Physical Therapy Treatment  Patient Details  Name: Rachel [name] MRN: [id number] Date of Birth: Sep 29, 1950 Referring Provider: Garvin Fila   Encounter Date: 01/27/2016      PT End of Session - 01/27/16 1544    Visit Number 7   Number of Visits 16   Date for PT Re-Evaluation 01/27/16   Authorization Type BCBS Other    Authorization Time Period 12/30/15 to 02/29/16   Authorization - Visit Number 7   Authorization - Number of Visits 12   PT Start Time 1302   PT Stop Time 1343   PT Time Calculation (min) 41 min   Equipment Utilized During Treatment Gait belt   Activity Tolerance Patient tolerated treatment well;Patient limited by fatigue   Behavior During Therapy [facility] for tasks assessed/performed      Past Medical History:  Diagnosis Date  . Blindness   . Diabetes mellitus type I ([place])   . GERD (gastroesophageal reflux disease)   . Hyperlipidemia   . Paroxysmal atrial fibrillation (HCC)   . Schizophrenia ([place])   . Systemic hypertension   . Vertigo     Past Surgical History:  Procedure Laterality Date  . BRAIN TUMOR EXCISION    . NM MYOCAR PERF WALL MOTION  02/01/2007   no significant ischemia  . US ECHOCARDIOGRAPHY  12/20/2003   mild mitral annular ca+,mild MR,TR,PI,AOV mildly sclerotic    There were no vitals filed for this visit.      Subjective Assessment - 01/27/16 1327    Subjective Pt without complaints today.   Currently in Pain? No/denies   Pain Orientation Lower   Pain Descriptors / Indicators Tightness                         [facility] Adult PT Treatment/Exercise - 01/27/16 0001      Knee/Hip Exercises: Aerobic   Nustep Nustep level 0, UE/LE, min-mod(A) for continous motion      Knee/Hip Exercises: Standing   Gait Training 48' without HHA or AD with therapist verbal guidance     Knee/Hip Exercises: Seated   Other Seated Knee/Hip Exercises static sitting on green dyna disc cues for posture; lateral reaching, cross-midline trunk rotation, forward flexion at 3 angles performed with cones/ball today, min-mod cues                   PT Short Term Goals - 12/30/15 1501      PT SHORT TERM GOAL #1   Title Patient to be able to complete all bed mobility and transfers with min assist in order to improve mobility at home    Time 4   Period Weeks   Status New     PT SHORT TERM GOAL #2   Title Patient to be able to complete all sit to stand transfers with good control and without crashing down to the seat in order to improve mobility and safety    Time 4   Period Weeks   Status New     PT SHORT TERM GOAL #3   Title Patient to be able sit unsupported statically for at least 15 minutes in order to show improved core strength and allow her to sit at the table to eat a short meal    Time 4   Period Weeks   Status New  PT SHORT TERM GOAL #4   Title Patient to be able to stand unsupported statically for at least 5 minutes in order to show improvement of condition and improved balance    Time 4   Period Weeks   Status New     PT SHORT TERM GOAL #5   Title Patient to be able to consistently perform HEP with her husband's help in order to facilitate improvement in condition    Time 1   Period Weeks   Status New           PT Long Term Goals - 12/30/15 1506      PT LONG TERM GOAL #1   Title Patient to be modified independent in all bed mobility and functional transfers in order to improve function and reduce caregiver burden at home    Time 8   Period Weeks   Status New     PT LONG TERM GOAL #2   Title Patient to be able to sit unsupported for 30 minutes in order to show improved core/trunk strength and allow her to sit and eat a long meal    Time 8   Period Weeks   Status New     PT LONG TERM GOAL #3   Title Patient to be able to statically  stand unsupported for 15 minutes to show improved functional strength and balance, reduced fall risk    Time 8   Period Weeks   Status New     PT LONG TERM GOAL #4   Title Patient to be able to ambulate with LRAD and no assistance from spouse beyond min guard in order to show improved general mobility with reduced fall risk    Time 8   Period Weeks   Status New     PT LONG TERM GOAL #5   Title Patient to demonstrate functional strength at least 4+/5 in all tested groups in order to improve mobility and balance    Time 8   Period Weeks   Status New               Plan - 01/27/16 1545    Clinical Impression Statement Continued with focus on improving core strength and stability.  Began on nustep to work on coordinating/reciprocal motions.  Pt require frequent verbal and tactile cues to keep keep motion going and at a steadier pace.  Pt able to complete ambulation this session without HHA, with therapist following behind with belt/CGA.  Pt requeseted to sit after 75 feet due to fatigue.  Challenged core stability in sitting position on dynadisc with reaching outside BOS.  Pt overall done well with postural and correctional cues as needed. No pain verbalized or pain behaviors during or at the end of session.     Rehab Potential Good   Clinical Impairments Affecting Rehab Potential supportive spouse, severity of impairment   PT Frequency 2x / week   PT Duration 8 weeks   PT Treatment/Interventions ADLs/Self Care Home Management;Biofeedback;DME Instruction;Gait training;Stair training;Functional mobility training;Therapeutic activities;Therapeutic exercise;Balance training;Neuromuscular re-education;Patient/family education;Manual techniques;Energy conservation;Taping   PT Next Visit Plan continue focus on core strength/stability and balance training. Continue Nustep. Monitor back pain.       Patient will benefit from skilled therapeutic intervention in order to improve the following  deficits and impairments:  Abnormal gait, Dizziness, Improper body mechanics, Pain, Decreased coordination, Decreased mobility, Postural dysfunction, Decreased activity tolerance, Decreased endurance, Decreased strength, Decreased balance, Decreased safety awareness, Difficulty walking  Visit Diagnosis: Other  symptoms and signs involving the musculoskeletal system  Muscle weakness (generalized)  Unsteadiness on feet  Other abnormalities of gait and mobility  Repeated falls     Problem List Patient Active Problem List   Diagnosis Date Noted  . 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 Blind   . Schizophrenia ([place])   . Diabetes mellitus type 2 in nonobese (HCC)   . Acute encephalopathy   . Brain mass   . Subarachnoid hemorrhage following injury, no loss of consciousness ([place]) 09/18/2015  . SAH (subarachnoid hemorrhage) ([place]) 09/17/2015  . Facial droop 12/04/2013  . 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 Blind in both eyes 05/31/2013  . HTN (hypertension) 10/11/2012  . PAF (paroxysmal atrial fibrillation) ([place]) 10/11/2012  . Dyslipidemia 10/11/2012  . H/O brain tumor 10/11/2012  . Insomnia due to mental disorder 12/29/2011  . Libido, decreased 12/29/2011  . Depression 04/14/2011    Teena Irani, PTA/CLT [phone]  01/27/2016, 3:48 PM  [place], [place], [address] Phone: [phone]   Fax:  [phone]  Name: BERNITA COOLIDGE MRN: [id number] Date of Birth: 08-25-1950

## 2016-01-30 ENCOUNTER — Ambulatory Visit (HOSPITAL_COMMUNITY): Payer: BLUE CROSS/BLUE SHIELD | Admitting: Physical Therapy

## 2016-01-30 DIAGNOSIS — R2689 Other abnormalities of gait and mobility: Secondary | ICD-10-CM

## 2016-01-30 DIAGNOSIS — R2681 Unsteadiness on feet: Secondary | ICD-10-CM

## 2016-01-30 DIAGNOSIS — R29898 Other symptoms and signs involving the musculoskeletal system: Secondary | ICD-10-CM

## 2016-01-30 DIAGNOSIS — R296 Repeated falls: Secondary | ICD-10-CM

## 2016-01-30 DIAGNOSIS — M6281 Muscle weakness (generalized): Secondary | ICD-10-CM

## 2016-01-30 NOTE — Therapy (Signed)
[facility] [address] [place], [place], [address] Phone: [phone]   Fax:  [phone]  Physical Therapy Treatment (Re-Assessment)  Patient Details  Name: Rachel [name] MRN: [id number] Date of Birth: 1950/10/31 Referring Provider: Garvin Fila   Encounter Date: 01/30/2016      PT End of Session - 01/30/16 1833    Visit Number 8   Number of Visits 10   Date for PT Re-Evaluation 02/24/16   Authorization Type BCBS Other    Authorization Time Period 12/30/15 to 02/29/16   PT Start Time 1433   PT Stop Time 1511   PT Time Calculation (min) 38 min   Activity Tolerance Patient tolerated treatment well;Patient limited by fatigue   Behavior During Therapy [facility] for tasks assessed/performed      Past Medical History:  Diagnosis Date  . Blindness   . Diabetes mellitus type I ([place])   . GERD (gastroesophageal reflux disease)   . Hyperlipidemia   . Paroxysmal atrial fibrillation (HCC)   . Schizophrenia ([place])   . Systemic hypertension   . Vertigo     Past Surgical History:  Procedure Laterality Date  . BRAIN TUMOR EXCISION    . NM MYOCAR PERF WALL MOTION  02/01/2007   no significant ischemia  . US ECHOCARDIOGRAPHY  12/20/2003   mild mitral annular ca+,mild MR,TR,PI,AOV mildly sclerotic    There were no vitals filed for this visit.      Subjective Assessment - 01/30/16 1438    Subjective Patient arrives today with her husband, who reports she is doing better. Husband says that she has good and bad days with her walking, no falls recently.    Pertinent History HTN, A-fib, DM, history of brain tumor and excision, facial palsy, SAH, partial sight L eye otherwise blind, schizophrenic    Patient Stated Goals improve balance and walking    Currently in Pain? No/denies            [facility] PT Assessment - 01/30/16 0001      Strength   Right Hip Flexion 3+/5   Left Hip Flexion 3+/5   Right Knee Flexion 3-/5   Right Knee  Extension 4/5   Left Knee Flexion 3-/5   Left Knee Extension 4/5   Right Ankle Dorsiflexion 4/5   Left Ankle Dorsiflexion 4/5     Bed Mobility   Rolling Right 2: Max assist   Rolling Left 3: Mod assist     Transfers   Sit to Stand 6: Modified independent (Device/Increase time)  mod (I) to min assist    Supine to Sit 2: Max assist   Sit to Supine 4: Min assist        Nustep for 5 minutes (not including assist to get on machine), level 0, min-mod assist and verbal cues for continuous motion                      PT Education - 01/30/16 1833    Education provided Yes   Education Details progress with skilled PT services, POC moving forward    Person(s) Educated Patient;Spouse   Methods Explanation   Comprehension Verbalized understanding          PT Short Term Goals - 01/30/16 1454      PT SHORT TERM GOAL #1   Title Patient to be able to complete all bed mobility and transfers with min assist in order to improve mobility at home  Baseline 12/21- continues to need assistance    Time 4   Period Weeks   Status On-going     PT SHORT TERM GOAL #2   Title Patient to be able to complete all sit to stand transfers with good control and without crashing down to the seat in order to improve mobility and safety    Baseline 12/21- husband reoprts she is doing better with reminders    Time 4   Period Weeks   Status Partially Met     PT SHORT TERM GOAL #3   Title Patient to be able sit unsupported statically for at least 15 minutes in order to show improved core strength and allow her to sit at the table to eat a short meal    Baseline 12/21- improving greatly but not at 15 minutes yet    Time 4   Period Weeks   Status Partially Met     PT SHORT TERM GOAL #4   Title Patient to be able to stand unsupported statically for at least 5 minutes in order to show improvement of condition and improved balance    Time 4   Period Weeks   Status On-going     PT  SHORT TERM GOAL #5   Title Patient to be able to consistently perform HEP with her husband's help in order to facilitate improvement in condition    Baseline 12/21- husband reports she will not do exercises at home    Time 1   Period Weeks   Status On-going           PT Long Term Goals - 01/30/16 1457      PT LONG TERM GOAL #1   Title Patient to be modified independent in all bed mobility and functional transfers in order to improve function and reduce caregiver burden at home    Time 8   Period Weeks   Status On-going     PT LONG TERM GOAL #2   Title Patient to be able to sit unsupported for 30 minutes in order to show improved core/trunk strength and allow her to sit and eat a long meal    Time 8   Period Weeks   Status On-going     PT LONG TERM GOAL #3   Title Patient to be able to statically stand unsupported for 15 minutes to show improved functional strength and balance, reduced fall risk    Time 8   Period Weeks   Status On-going     PT LONG TERM GOAL #4   Title Patient to be able to ambulate with LRAD and no assistance from spouse beyond min guard in order to show improved general mobility with reduced fall risk    Time 8   Period Weeks   Status On-going     PT LONG TERM GOAL #5   Title Patient to demonstrate functional strength at least 4+/5 in all tested groups in order to improve mobility and balance    Time 8   Period Weeks   Status On-going               Plan - 01/30/16 1834    Clinical Impression Statement Re-assessment performed today. Patient appears to have made minimal objective gains with skilled PT services at this time outside of some mild strength gains; she continues to demonstrate significant impairments in gait pattern, safety awareness, functional weakness, functional activity tolerance, posture and core strength, and static sitting/standing tolerance. However, qualitatively, patient does  demonstrate improved tolerance to unsupported  sitting and standing tasks. Husband reports she is moving around better at home, however he states that they are not interested in continuing with skilled PT services past what is already scheduled. Patient has not been complaint with HEP and husband states she refuses to do it. Recommend ongoing focus on POC items before DC in 2 more sessions.    Rehab Potential Good   Clinical Impairments Affecting Rehab Potential supportive spouse, severity of impairment   PT Frequency Other (comment)  2  more sessions    PT Duration Other (comment)  2 more sessions    PT Treatment/Interventions ADLs/Self Care Home Management;Biofeedback;DME Instruction;Gait training;Stair training;Functional mobility training;Therapeutic activities;Therapeutic exercise;Balance training;Neuromuscular re-education;Patient/family education;Manual techniques;Energy conservation;Taping   PT Next Visit Plan continue focus on core strength/stability and balance training. Continue Nustep. Monitor back pain. DC in 2 sessions per family request.    PT Home Exercise Plan eval: lateral weight shifts, unsupported sitting, slow sit to stand/stand to sit    Consulted and Agree with Plan of Care Patient;Family member/caregiver      Patient will benefit from skilled therapeutic intervention in order to improve the following deficits and impairments:  Abnormal gait, Dizziness, Improper body mechanics, Pain, Decreased coordination, Decreased mobility, Postural dysfunction, Decreased activity tolerance, Decreased endurance, Decreased strength, Decreased balance, Decreased safety awareness, Difficulty walking  Visit Diagnosis: Other symptoms and signs involving the musculoskeletal system  Muscle weakness (generalized)  Unsteadiness on feet  Other abnormalities of gait and mobility  Repeated falls     Problem List Patient Active Problem List   Diagnosis Date Noted  . 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 Blind   . Schizophrenia ([place])   .  Diabetes mellitus type 2 in nonobese (HCC)   . Acute encephalopathy   . Brain mass   . Subarachnoid hemorrhage following injury, no loss of consciousness ([place]) 09/18/2015  . SAH (subarachnoid hemorrhage) ([place]) 09/17/2015  . Facial droop 12/04/2013  . 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 Blind in both eyes 05/31/2013  . HTN (hypertension) 10/11/2012  . PAF (paroxysmal atrial fibrillation) ([place]) 10/11/2012  . Dyslipidemia 10/11/2012  . H/O brain tumor 10/11/2012  . Insomnia due to mental disorder 12/29/2011  . Libido, decreased 12/29/2011  . Depression 04/14/2011     Deniece Ree PT, DPT [facility] [address] [place], [place], [address] Phone: [phone]   Fax:  [phone]  Name: Rachel [name] MRN: [id number] Date of Birth: 1950/06/06

## 2016-01-31 ENCOUNTER — Ambulatory Visit (HOSPITAL_COMMUNITY): Payer: BLUE CROSS/BLUE SHIELD | Admitting: Specialist

## 2016-01-31 DIAGNOSIS — R29898 Other symptoms and signs involving the musculoskeletal system: Secondary | ICD-10-CM

## 2016-01-31 NOTE — Therapy (Signed)
[facility] [place], [place], [address] Phone: [phone]   Fax:  [phone]  Occupational Therapy Treatment  Patient Details  Name: Rachel [name] MRN: [id number] Date of Birth: 1950-11-14 Referring Provider: Antony Contras, MD  Encounter Date: 01/31/2016      OT End of Session - 01/31/16 1527    Visit Number 8   Number of Visits 8   Date for OT Re-Evaluation 01/30/16   Authorization Type BCBS   OT Start Time 1300   OT Stop Time 1340   OT Time Calculation (min) 40 min   Activity Tolerance Patient tolerated treatment well   Behavior During Therapy [facility] for tasks assessed/performed      Past Medical History:  Diagnosis Date  . Blindness   . Diabetes mellitus type I ([place])   . GERD (gastroesophageal reflux disease)   . Hyperlipidemia   . Paroxysmal atrial fibrillation (HCC)   . Schizophrenia ([place])   . Systemic hypertension   . Vertigo     Past Surgical History:  Procedure Laterality Date  . BRAIN TUMOR EXCISION    . NM MYOCAR PERF WALL MOTION  02/01/2007   no significant ischemia  . US ECHOCARDIOGRAPHY  12/20/2003   mild mitral annular ca+,mild MR,TR,PI,AOV mildly sclerotic    There were no vitals filed for this visit.      Subjective Assessment - 01/31/16 1525    Subjective  S:  I think I can get around better now    Currently in Pain? No/denies   Pain Score 0-No pain            [facility] OT Assessment - 01/31/16 0001      Assessment   Diagnosis Late effects of CVA     Precautions   Precautions Fall     Restrictions   Weight Bearing Restrictions No     ADL   ADL comments increased ease moving about the home     Strength   Right Hand Grip (lbs) 28  26   Right Hand Lateral Pinch 12 lbs  3   Right Hand 3 Point Pinch 6 lbs  2   Left Hand Grip (lbs) 28  26   Left Hand Lateral Pinch 8 lbs  4   Left Hand 3 Point Pinch 6 lbs  8                  OT Treatments/Exercises  (OP) - 01/31/16 0001      Exercises   Exercises Neurological Re-education;Hand;Wrist     Additional Wrist Exercises   Theraputty Flatten;Roll;Grip;Locate Pegs   Theraputty - Flatten pink   Theraputty - Roll pink   Theraputty - Grip pink   Theraputty - Locate Pegs 10 beads max pa and tactile cues to locate pegs                OT Education - 01/31/16 1526    Education provided Yes   Education Details educated on continued grip and digit strengthening with light resist theraputty    Person(s) Educated Patient;Spouse   Methods Explanation;Demonstration;Handout   Comprehension Verbalized understanding;Returned demonstration;Tactile cues required          OT Short Term Goals - 01/31/16 1319      OT SHORT TERM GOAL #1   Title Patient/Husband will be educated on a HEP and/or adaptive devices; if needed  to increase functional performance during daily tasks.    Time 4   Period Weeks  Status Achieved     OT SHORT TERM GOAL #2   Title Patient will increase bilateral grip strength by 20# and pinch strength by 5# to increase ability to complete fine motor tasks such as self feeding with decreased difficulty.   Time 4   Period Weeks   Status Achieved                  Plan - 01/31/16 1527    Clinical Impression Statement A:  Patient has met 2/2 short term goals.  She has improved her bilateral grip strength and feels that the adaptive strategies we educated her and her husband on (built up utensil handles) have been helpful at home.     Plan P:  DC from skilled OT intervention this date as 2/2 short term goals have been achieved.  Patient and husband are pleased with progress.    Consulted and Agree with Plan of Care Patient;Family member/caregiver      Patient will benefit from skilled therapeutic intervention in order to improve the following deficits and impairments:  Decreased strength, Decreased coordination  Visit Diagnosis: Other symptoms and signs  involving the musculoskeletal system    Problem List Patient Active Problem List   Diagnosis Date Noted  . 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 Blind   . Schizophrenia ([place])   . Diabetes mellitus type 2 in nonobese (HCC)   . Acute encephalopathy   . Brain mass   . Subarachnoid hemorrhage following injury, no loss of consciousness ([place]) 09/18/2015  . SAH (subarachnoid hemorrhage) ([place]) 09/17/2015  . Facial droop 12/04/2013  . 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 Blind in both eyes 05/31/2013  . HTN (hypertension) 10/11/2012  . PAF (paroxysmal atrial fibrillation) ([place]) 10/11/2012  . Dyslipidemia 10/11/2012  . H/O brain tumor 10/11/2012  . Insomnia due to mental disorder 12/29/2011  . Libido, decreased 12/29/2011  . Depression 04/14/2011    Vangie Bicker, [place], OTR/L [phone]  01/31/2016, 3:34 PM OCCUPATIONAL THERAPY DISCHARGE SUMMARY  Visits from Start of Care: 8 Current functional level related to goals / functional outcomes: Achieved goals   Remaining deficits: At prior level of independence   Education / Equipment: theraputty built up utensils  Plan: Patient agrees to discharge.  Patient goals were met. Patient is being discharged due to meeting the stated rehab goals.  ?????        Vangie Bicker, [place], OTR/L [phone] [facility] [address] [place], [place], [address] Phone: [phone]   Fax:  [phone]  Name: Rachel [name] MRN: [id number] Date of Birth: Sep 27, 1950

## 2016-01-31 NOTE — Patient Instructions (Signed)
Home Exercises Program Theraputty Exercises  Do the following exercises 2 times a day using your affected hand.  1. Roll putty into a ball.  2. Make into a pancake.  3. Roll putty into a roll.  4. Pinch along log with first finger and thumb.   5. Make into a ball.  6. Roll it back into a log.   7. Pinch using thumb and side of first finger.  8. Roll into a ball, then flatten into a pancake.  9. Using your fingers, make putty into a mountain.   

## 2016-02-04 ENCOUNTER — Ambulatory Visit (HOSPITAL_COMMUNITY): Payer: Self-pay | Admitting: Physical Therapy

## 2016-02-04 ENCOUNTER — Ambulatory Visit (HOSPITAL_COMMUNITY): Payer: BLUE CROSS/BLUE SHIELD | Admitting: Physical Therapy

## 2016-02-04 ENCOUNTER — Ambulatory Visit (HOSPITAL_COMMUNITY): Payer: BLUE CROSS/BLUE SHIELD | Admitting: Occupational Therapy

## 2016-02-04 DIAGNOSIS — R29898 Other symptoms and signs involving the musculoskeletal system: Secondary | ICD-10-CM | POA: Diagnosis not present

## 2016-02-04 DIAGNOSIS — M6281 Muscle weakness (generalized): Secondary | ICD-10-CM

## 2016-02-04 DIAGNOSIS — R2681 Unsteadiness on feet: Secondary | ICD-10-CM

## 2016-02-04 DIAGNOSIS — R2689 Other abnormalities of gait and mobility: Secondary | ICD-10-CM

## 2016-02-04 DIAGNOSIS — R296 Repeated falls: Secondary | ICD-10-CM

## 2016-02-04 NOTE — Therapy (Signed)
[facility] [place], [place], [address] Phone: [phone]   Fax:  [phone]  Physical Therapy Treatment  Patient Details  Name: Rachel [name] MRN: [id number] Date of Birth: [birth year] Referring Provider: Garvin Fila   Encounter Date: 02/04/2016      PT End of Session - 02/04/16 1524    Visit Number 9   Number of Visits 10   Date for PT Re-Evaluation 02/24/16   Authorization Type BCBS Other    Authorization Time Period 12/30/15 to 02/29/16   Authorization - Visit Number 8   Authorization - Number of Visits 12   PT Start Time 4401   PT Stop Time 1513   PT Time Calculation (min) 38 min   Activity Tolerance Patient tolerated treatment well   Behavior During Therapy [facility] for tasks assessed/performed      Past Medical History:  Diagnosis Date  . Blindness   . Diabetes mellitus type I ([place])   . GERD (gastroesophageal reflux disease)   . Hyperlipidemia   . Paroxysmal atrial fibrillation (HCC)   . Schizophrenia ([place])   . Systemic hypertension   . Vertigo     Past Surgical History:  Procedure Laterality Date  . BRAIN TUMOR EXCISION    . NM MYOCAR PERF WALL MOTION  02/01/2007   no significant ischemia  . US ECHOCARDIOGRAPHY  12/20/2003   mild mitral annular ca+,mild MR,TR,PI,AOV mildly sclerotic    There were no vitals filed for this visit.      Subjective Assessment - 02/04/16 1446    Subjective (P)  Patient arrives iwth her husband today, no major changes and they are talking the rest of the day off    Pertinent History (P)  HTN, A-fib, DM, history of brain tumor and excision, facial palsy, SAH, partial sight L eye otherwise blind, schizophrenic    Currently in Pain? (P)  Yes   Pain Score (P)  --  unable to rate, states "a little bit of pain in my back"                          [facility] Adult PT Treatment/Exercise - 02/04/16 0001      Knee/Hip Exercises: Aerobic   Nustep Nustep  level 0, UE/LE, min-mod(A) for continous motion   6 minutes      Knee/Hip Exercises: Standing   Heel Raises Both;1 set;10 reps   Forward Step Up Both;1 set;10 reps   Forward Step Up Limitations 2 inch box              Balance Exercises - 02/04/16 1521      Balance Exercises: Standing   Standing Eyes Opened Narrow base of support (BOS);1 rep;30 secs   SLS Eyes open;Solid surface;4 reps  cone taps, also did straight SLS with HHA    Partial Tandem Stance 2 reps;10 secs   Other Standing Exercises gait without holding on 1x70f in parallel bars           PT Education - 02/04/16 1524    Education provided No          PT Short Term Goals - 01/30/16 1454      PT SHORT TERM GOAL #1   Title Patient to be able to complete all bed mobility and transfers with min assist in order to improve mobility at home    Baseline 12/21- continues to need assistance    Time 4  Period Weeks   Status On-going     PT SHORT TERM GOAL #2   Title Patient to be able to complete all sit to stand transfers with good control and without crashing down to the seat in order to improve mobility and safety    Baseline 12/21- husband reoprts she is doing better with reminders    Time 4   Period Weeks   Status Partially Met     PT SHORT TERM GOAL #3   Title Patient to be able sit unsupported statically for at least 15 minutes in order to show improved core strength and allow her to sit at the table to eat a short meal    Baseline 12/21- improving greatly but not at 15 minutes yet    Time 4   Period Weeks   Status Partially Met     PT SHORT TERM GOAL #4   Title Patient to be able to stand unsupported statically for at least 5 minutes in order to show improvement of condition and improved balance    Time 4   Period Weeks   Status On-going     PT SHORT TERM GOAL #5   Title Patient to be able to consistently perform HEP with her husband's help in order to facilitate improvement in condition     Baseline 12/21- husband reports she will not do exercises at home    Time 1   Period Weeks   Status On-going           PT Long Term Goals - 01/30/16 1457      PT LONG TERM GOAL #1   Title Patient to be modified independent in all bed mobility and functional transfers in order to improve function and reduce caregiver burden at home    Time 8   Period Weeks   Status On-going     PT LONG TERM GOAL #2   Title Patient to be able to sit unsupported for 30 minutes in order to show improved core/trunk strength and allow her to sit and eat a long meal    Time 8   Period Weeks   Status On-going     PT LONG TERM GOAL #3   Title Patient to be able to statically stand unsupported for 15 minutes to show improved functional strength and balance, reduced fall risk    Time 8   Period Weeks   Status On-going     PT LONG TERM GOAL #4   Title Patient to be able to ambulate with LRAD and no assistance from spouse beyond min guard in order to show improved general mobility with reduced fall risk    Time 8   Period Weeks   Status On-going     PT LONG TERM GOAL #5   Title Patient to demonstrate functional strength at least 4+/5 in all tested groups in order to improve mobility and balance    Time 8   Period Weeks   Status On-going               Plan - 02/04/16 1525    Clinical Impression Statement Patient arrives with her husband today, pleasant and reporting no major changes since last session. Continued assisted work on [employer] assist required today for continuity of motion), as well as standing exercises in parallel bars and some balance work as tolerated. Patient did become fearful today in bars with balance exercises today. Recommend one more skilled session with focus on balance/reciprocal motions before DC  per family request.    Rehab Potential Good   Clinical Impairments Affecting Rehab Potential supportive spouse, severity of impairment   PT Frequency Other (comment)  one  more session    PT Duration Other (comment)  one more session    PT Next Visit Plan focus on Nustep/balance. DC.    PT Home Exercise Plan eval: lateral weight shifts, unsupported sitting, slow sit to stand/stand to sit    Consulted and Agree with Plan of Care Patient;Family member/caregiver      Patient will benefit from skilled therapeutic intervention in order to improve the following deficits and impairments:  Abnormal gait, Dizziness, Improper body mechanics, Pain, Decreased coordination, Decreased mobility, Postural dysfunction, Decreased activity tolerance, Decreased endurance, Decreased strength, Decreased balance, Decreased safety awareness, Difficulty walking  Visit Diagnosis: Other symptoms and signs involving the musculoskeletal system  Muscle weakness (generalized)  Unsteadiness on feet  Other abnormalities of gait and mobility  Repeated falls     Problem List Patient Active Problem List   Diagnosis Date Noted  . 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 Blind   . Schizophrenia ([place])   . Diabetes mellitus type 2 in nonobese (HCC)   . Acute encephalopathy   . Brain mass   . Subarachnoid hemorrhage following injury, no loss of consciousness ([place]) 09/18/2015  . SAH (subarachnoid hemorrhage) ([place]) 09/17/2015  . Facial droop 12/04/2013  . 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 Blind in both eyes 05/31/2013  . HTN (hypertension) 10/11/2012  . PAF (paroxysmal atrial fibrillation) ([place]) 10/11/2012  . Dyslipidemia 10/11/2012  . H/O brain tumor 10/11/2012  . Insomnia due to mental disorder 12/29/2011  . Libido, decreased 12/29/2011  . Depression 04/14/2011    Deniece Ree PT, DPT [facility] [address] [place], [place], [address] Phone: [phone]   Fax:  [phone]  Name: Rachel [name] MRN: [id number] Date of Birth: Jun 24, 1950

## 2016-02-06 ENCOUNTER — Ambulatory Visit (HOSPITAL_COMMUNITY): Payer: BLUE CROSS/BLUE SHIELD

## 2016-02-06 ENCOUNTER — Ambulatory Visit (HOSPITAL_COMMUNITY): Payer: BLUE CROSS/BLUE SHIELD | Admitting: Occupational Therapy

## 2016-02-06 DIAGNOSIS — R29898 Other symptoms and signs involving the musculoskeletal system: Secondary | ICD-10-CM | POA: Diagnosis not present

## 2016-02-06 DIAGNOSIS — R296 Repeated falls: Secondary | ICD-10-CM

## 2016-02-06 DIAGNOSIS — R2681 Unsteadiness on feet: Secondary | ICD-10-CM

## 2016-02-06 DIAGNOSIS — R2689 Other abnormalities of gait and mobility: Secondary | ICD-10-CM

## 2016-02-06 DIAGNOSIS — M6281 Muscle weakness (generalized): Secondary | ICD-10-CM

## 2016-02-06 NOTE — Therapy (Signed)
[facility] [address] [place], [place], [address] Phone: [phone]   Fax:  [phone]  Physical Therapy Treatment (Discharge)  Patient Details  Name: Rachel [name] MRN: [id number] Date of Birth: 12/11/1950 Referring Provider: Garvin Fila   Encounter Date: 02/06/2016      PT End of Session - 02/06/16 1437    Visit Number 10   Number of Visits 10   Date for PT Re-Evaluation 02/24/16   Authorization Type [employer] Other    Authorization Time Period 12/30/15 to 02/29/16   Authorization - Visit Number 10   Authorization - Number of Visits 12   PT Start Time 4034   PT Stop Time 1518   PT Time Calculation (min) 45 min   Equipment Utilized During Treatment Gait belt   Activity Tolerance Patient tolerated treatment well   Behavior During Therapy [facility] for tasks assessed/performed      Past Medical History:  Diagnosis Date  . Blindness   . Diabetes mellitus type I ([place])   . GERD (gastroesophageal reflux disease)   . Hyperlipidemia   . Paroxysmal atrial fibrillation (H[facility])   . Schizophrenia ([place])   . Systemic hypertension   . Vertigo     Past Surgical History:  Procedure Laterality Date  . BRAIN TUMOR EXCISION    . NM MYOCAR PERF WALL MOTION  02/01/2007   no significant ischemia  . US ECHOCARDIOGRAPHY  12/20/2003   mild mitral annular ca+,mild MR,TR,PI,AOV mildly sclerotic    There were no vitals filed for this visit.      Subjective Assessment - 02/06/16 1426    Subjective Pt arrived today with husband and reports she is doing well at home.  No reports of pain or recent falls.     Pertinent History HTN, A-fib, DM, history of brain tumor and excision, facial palsy, SAH, partial sight L eye otherwise blind, schizophrenic    Patient Stated Goals improve balance and walking    Currently in Pain? No/denies           [facility] Adult PT Treatment/Exercise - 02/06/16 0001      Knee/Hip Exercises: Aerobic   Nustep  Nustep level 0, UE/LE, min-mod(A) for continous motion   6 minutes with therapist cueing for continous movements     Knee/Hip Exercises: Standing   Heel Raises Both;1 set;10 reps   Heel Raises Limitations Toe raises 10x   Hip Flexion Both;10 reps;Knee bent  alternating   Forward Step Up Both;1 set;10 reps   Forward Step Up Limitations 4 in step             Balance Exercises - 02/06/16 1520      Balance Exercises: Standing   Standing Eyes Opened Narrow base of support (BOS);1 rep;30 secs   SLS Eyes open;Solid surface;4 reps;Intermittent upper extremity support   Partial Tandem Stance 3 reps;30 secs   Sidestepping Upper extremity support;2 reps  2RT in // bars   Marching Limitations alternating 10x in // bars   Sit to Stand Time 10x with safety reminder for back of LE and reaching              PT Short Term Goals - 01/30/16 1454      PT SHORT TERM GOAL #1   Title Patient to be able to complete all bed mobility and transfers with min assist in order to improve mobility at home    Baseline 12/21- continues to need assistance    Time 4  Period Weeks   Status On-going     PT SHORT TERM GOAL #2   Title Patient to be able to complete all sit to stand transfers with good control and without crashing down to the seat in order to improve mobility and safety    Baseline 12/21- husband reoprts she is doing better with reminders    Time 4   Period Weeks   Status Partially Met     PT SHORT TERM GOAL #3   Title Patient to be able sit unsupported statically for at least 15 minutes in order to show improved core strength and allow her to sit at the table to eat a short meal    Baseline 12/21- improving greatly but not at 15 minutes yet    Time 4   Period Weeks   Status Partially Met     PT SHORT TERM GOAL #4   Title Patient to be able to stand unsupported statically for at least 5 minutes in order to show improvement of condition and improved balance    Time 4   Period  Weeks   Status On-going     PT SHORT TERM GOAL #5   Title Patient to be able to consistently perform HEP with her husband's help in order to facilitate improvement in condition    Baseline 12/21- husband reports she will not do exercises at home    Time 1   Period Weeks   Status On-going           PT Long Term Goals - 01/30/16 1457      PT LONG TERM GOAL #1   Title Patient to be modified independent in all bed mobility and functional transfers in order to improve function and reduce caregiver burden at home    Time 8   Period Weeks   Status On-going     PT LONG TERM GOAL #2   Title Patient to be able to sit unsupported for 30 minutes in order to show improved core/trunk strength and allow her to sit and eat a long meal    Time 8   Period Weeks   Status On-going     PT LONG TERM GOAL #3   Title Patient to be able to statically stand unsupported for 15 minutes to show improved functional strength and balance, reduced fall risk    Time 8   Period Weeks   Status On-going     PT LONG TERM GOAL #4   Title Patient to be able to ambulate with LRAD and no assistance from spouse beyond min guard in order to show improved general mobility with reduced fall risk    Time 8   Period Weeks   Status On-going     PT LONG TERM GOAL #5   Title Patient to demonstrate functional strength at least 4+/5 in all tested groups in order to improve mobility and balance    Time 8   Period Weeks   Status On-going               Plan - 02/06/16 1437    Clinical Impression Statement Session focus on balance training and functional strengthening.  Began session with Nustep with therapist cueing for contnuance of motion.  Pt able to complete all exercises very slow and controlled with no LOB episodes.  Pt did become fearful of letting go of // bars with balance activtiies due to fear of falling and vision impairements.  Added sidestepping for functional balance training wiht  moderate cueing  required.  No reports of pain through session, was limited by fatigue.  Reviewed HEP with pt and husband to continue at home as this was last visit per PT POC following reassesment last sessoin.     Rehab Potential Good   Clinical Impairments Affecting Rehab Potential supportive spouse, severity of impairment   PT Frequency --  DC today   PT Duration --  DC today   PT Treatment/Interventions ADLs/Self Care Home Management;Biofeedback;DME Instruction;Gait training;Stair training;Functional mobility training;Therapeutic activities;Therapeutic exercise;Balance training;Neuromuscular re-education;Patient/family education;Manual techniques;Energy conservation;Taping   PT Next Visit Plan DC to HEP per PT POC   PT Home Exercise Plan eval: lateral weight shifts, unsupported sitting, slow sit to stand/stand to sit       Patient will benefit from skilled therapeutic intervention in order to improve the following deficits and impairments:  Abnormal gait, Dizziness, Improper body mechanics, Pain, Decreased coordination, Decreased mobility, Postural dysfunction, Decreased activity tolerance, Decreased endurance, Decreased strength, Decreased balance, Decreased safety awareness, Difficulty walking  Visit Diagnosis: Repeated falls  Other symptoms and signs involving the musculoskeletal system  Muscle weakness (generalized)  Unsteadiness on feet  Other abnormalities of gait and mobility     Problem List Patient Active Problem List   Diagnosis Date Noted  . 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 Blind   . Schizophrenia ([place])   . Diabetes mellitus type 2 in nonobese (HCC)   . Acute encephalopathy   . Brain mass   . Subarachnoid hemorrhage following injury, no loss of consciousness ([place]) 09/18/2015  . SAH (subarachnoid hemorrhage) ([place]) 09/17/2015  . Facial droop 12/04/2013  . 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 Blind in both eyes 05/31/2013  . HTN (hypertension) 10/11/2012  . PAF (paroxysmal atrial  fibrillation) ([place]) 10/11/2012  . Dyslipidemia 10/11/2012  . H/O brain tumor 10/11/2012  . Insomnia due to mental disorder 12/29/2011  . Libido, decreased 12/29/2011  . Depression 04/14/2011   Ihor Austin, [place]; [place]  Aldona Lento 02/06/2016, 4:24 PM   PHYSICAL THERAPY DISCHARGE SUMMARY  Visits from Start of Care: 10  Current functional level related to goals / functional outcomes: Today's patient's last session due to family request to DC, as they have quite a bit going on right now. No updates to HEP as patient is not compliant. Patient has shown some qualitative changes but does not show significant objective progress. DC this session.    Remaining deficits: Weakness, unsteadiness, gait impairment, reduced functional activity tolerance    Education / Equipment: DC today  Plan: Patient agrees to discharge.  Patient goals were not met. Patient is being discharged due to the patient's request.  ?????     Deniece Ree PT, DPT [place], [place], [address] Phone: [phone]   Fax:  [phone]  Name: Rachel [name] MRN: [id number] Date of Birth: Jun 04, 1950

## 2016-03-19 ENCOUNTER — Encounter (HOSPITAL_COMMUNITY): Payer: Self-pay | Admitting: Psychiatry

## 2016-03-19 ENCOUNTER — Ambulatory Visit (INDEPENDENT_AMBULATORY_CARE_PROVIDER_SITE_OTHER): Payer: BLUE CROSS/BLUE SHIELD | Admitting: Psychiatry

## 2016-03-19 VITALS — BP 134/41 | HR 100 | Ht 68.0 in | Wt 174.0 lb

## 2016-03-19 DIAGNOSIS — F200 Paranoid schizophrenia: Secondary | ICD-10-CM

## 2016-03-19 DIAGNOSIS — Z888 Allergy status to other drugs, medicaments and biological substances status: Secondary | ICD-10-CM

## 2016-03-19 DIAGNOSIS — Z9889 Other specified postprocedural states: Secondary | ICD-10-CM

## 2016-03-19 DIAGNOSIS — Z91013 Allergy to seafood: Secondary | ICD-10-CM | POA: Diagnosis not present

## 2016-03-19 DIAGNOSIS — Z882 Allergy status to sulfonamides status: Secondary | ICD-10-CM | POA: Diagnosis not present

## 2016-03-19 MED ORDER — TEMAZEPAM 15 MG PO CAPS: 15.0000 mg | ORAL_CAPSULE | Freq: Every evening | ORAL | 2 refills | Status: DC | PRN

## 2016-03-19 MED ORDER — TRAZODONE HCL 100 MG PO TABS: ORAL_TABLET | ORAL | 2 refills | Status: DC

## 2016-03-19 MED ORDER — RISPERIDONE 2 MG PO TABS
2.0000 mg | ORAL_TABLET | Freq: Every day | ORAL | 2 refills | Status: DC
Start: 2016-03-19 — End: 2016-06-10

## 2016-03-19 MED ORDER — ALPRAZOLAM 0.25 MG PO TABS: 0.2500 mg | ORAL_TABLET | Freq: Three times a day (TID) | ORAL | 2 refills | Status: DC

## 2016-03-19 MED ORDER — PAROXETINE HCL 40 MG PO TABS: 40.0000 mg | ORAL_TABLET | Freq: Every day | ORAL | 2 refills | Status: DC

## 2016-03-19 NOTE — Progress Notes (Signed)
Patient ID: Rachel [name], female   DOB: 1950-12-25, 66 y.o.   MRN: [id number] Patient ID: Rachel [name], female   DOB: [birth year], 66 y.o.   MRN: [id number] Patient ID: Rachel [name], female   DOB: 07-Apr-1950, 66 y.o.   MRN: [id number] Patient ID: Rachel [name], female   DOB: 09-29-1950, 66 y.o.   MRN: [id number] Patient ID: Rachel [name], female   DOB: 1950/12/22, 66 y.o.   MRN: [id number] Patient ID: Rachel [name], female   DOB: 05-14-1950, 66 y.o.   MRN: [id number] Patient ID: Rachel [name], female   DOB: 1950/11/18, 66 y.o.   MRN: [id number] Patient ID: Rachel [name], female   DOB: [birth year], 66 y.o.   MRN: [id number] Patient ID: Rachel [name], female   DOB: 12-May-1950, 67 y.o.   MRN: [id number] Patient ID: Rachel [name], female   DOB: [birth year], 66 y.o.   MRN: [id number] Patient ID: Rachel [name], female   DOB: September 05, 1950, 66 y.o.   MRN: [id number] Patient ID: Rachel [name], female   DOB: [birth year], 66 y.o.   MRN: [id number] Patient ID: Rachel [name], female   DOB: 1950-04-29, 66 y.o.   MRN: [id number] Patient ID: Rachel [name], female   DOB: Apr 19, 1950, 66 y.o.   MRN: [id number] [facility] [id number] Progress Note [name] MRN: [id number] DOB: 03/12/1950 Age: 66 y.o.  Date: 03/19/2016 Start Time: 1:18 PM End Time: 1:53 PM  Chief Complaint: Chief Complaint  Patient presents with  . Schizophrenia  . Follow-up   Subjective: "She Was in the hospital." This patient is a 66 year old married black female lives with her husband in [place]. She has not worked in years but is not on disability. She is a very poor historian   Apparently she has had mental illness for many years. At one point she was depressed but also had psychotic symptoms that sound congruent with schizophrenia. Her husband thinks she is doing pretty well on her current medications but she tends to sleep too much during the  day. We've looked at her med list and found that her trazodone might be too high. She does better if she only takes 100 mg instead of 200 mg. She also takes meclizine in the morning which may be contributing to drowsiness. Neither one of them remember why she is on lthis.  The patient denies being depressed but her energy is poor and she is drowsy. She doesn't sleep well she'll start to have visual hallucinations at night. This happens if she sleeps throughout the whole day. She denies auditory hallucinations or paranoia.  The patient returns after 3 months with her husband. She is very zoned out and tired and says little today. He states that she went through 2 nights early in the week where she did not sleep. This was despite taking trazodone 100 mg. He is tried given her next or 50 and this didn't help. I told him we could increase it to 200 mg as needed and he agrees I also will give him a prescription for Restoril as needed when she can't sleep. After 2 days of not sleeping she starts getting confused and thinking people are in the house that aren't really there. Last night however she slept well and today is extremely tired and worn out. She had very little energy to walk or even answer any questions      Past psychiatric history Patient has been seeing in this office since 1998.  She was admitted at [facility] due to severe depression and psychotic features.  At that time she was having suicidal thoughts .  Since then she has been stable on current psychiatric medication. Allergies: Allergies  Allergen Reactions  . Sulfa Antibiotics Anaphylaxis  . Ibuprofen Other (See Comments)    unknown  . Shellfish Allergy Swelling    Mouth  Medical History: Past Medical History:  Diagnosis Date  . Blindness   . Diabetes mellitus type I ([place])   . GERD (gastroesophageal reflux disease)   . Hyperlipidemia   . Paroxysmal atrial fibrillation (H[facility])   . Schizophrenia ([place])   . Systemic  hypertension   . Vertigo   Patient has history of vertigo, GERD, hyperlipidemia, diabetes mellitus.  Patient is also legally blind.  She see Dr. Karren Burly for her annual checkup and will follow up for elevated Hemo A1c with him. Surgical History: Past Surgical History:  Procedure Laterality Date  . BRAIN TUMOR EXCISION    . NM MYOCAR PERF WALL MOTION  02/01/2007   no significant ischemia  . US ECHOCARDIOGRAPHY  12/20/2003   mild mitral annular ca+,mild MR,TR,PI,AOV mildly sclerotic  Reviewed during this appointment. Vitals: BP (!) 134/41 (BP Location: Right Arm, Patient Position: Sitting, Cuff Size: Large)   Pulse 100   Ht 5\' 8"  (1.727 m)   Wt 174 lb (78.9 kg)   BMI 26.46 kg/m  Wt down almost 3 pounds.  Mental status examination Patient is casually dressed and fairly groomed. Patient is legally blind and need her husband`s  assistance for walking. She described her mood as ok.  She is calm but very tired Her affect is blunted. Her speech is sparse and she answers in monosyllables She denies any active or passive suicidal thinking and homicidal thinking. Her thought process is  Negative for paranoid statementsShe is more blunted than usual . She denies any auditory or visual hallucination husband reports that she gets these when she is tired  She has poverty of thought content.  She's alert and oriented x3 her insight judgment and impulse control is okay. There no tremors shakes or extrapyramidal side effects noted.  Lab Results:  Results for orders placed or performed during the hospital encounter of 09/17/15 (from the past 8736 hour(s))  Comprehensive metabolic panel   Collection Time: 09/17/15 12:31 PM  Result Value Ref Range   Sodium 137 135 - 145 mmol/L   Potassium 3.9 3.5 - 5.1 mmol/L   Chloride 105 101 - 111 mmol/L   CO2 25 22 - 32 mmol/L   Glucose, Bld 266 (H) 65 - 99 mg/dL   BUN 6 6 - 20 mg/dL   Creatinine, Ser 0.89 0.44 - 1.00 mg/dL   Calcium 10.2 8.9 - 10.3 mg/dL   Total  Protein 6.7 6.5 - 8.1 g/dL   Albumin 3.8 3.5 - 5.0 g/dL   AST 19 15 - 41 U/L   ALT 15 14 - 54 U/L   Alkaline Phosphatase 56 38 - 126 U/L   Total Bilirubin 0.5 0.3 - 1.2 mg/dL   GFR calc non Af Amer >60 >60 mL/min   GFR calc Af Amer >60 >60 mL/min   Anion gap 7 5 - 15  CBC with Differential   Collection Time: 09/17/15 12:31 PM  Result Value Ref Range   WBC 10.2 4.0 - 10.5 K/uL   RBC 4.60 3.87 - 5.11 MIL/uL   Hemoglobin 13.9 12.0 - 15.0 g/dL   HCT 42.0 36.0 - 46.0 %  MCV 91.3 78.0 - 100.0 fL   MCH 30.2 26.0 - 34.0 pg   MCHC 33.1 30.0 - 36.0 g/dL   RDW 12.8 11.5 - 15.5 %   Platelets 255 150 - 400 K/uL   Neutrophils Relative % 72 %   Neutro Abs 7.3 1.7 - 7.7 K/uL   Lymphocytes Relative 18 %   Lymphs Abs 1.9 0.7 - 4.0 K/uL   Monocytes Relative 8 %   Monocytes Absolute 0.8 0.1 - 1.0 K/uL   Eosinophils Relative 1 %   Eosinophils Absolute 0.1 0.0 - 0.7 K/uL   Basophils Relative 1 %   Basophils Absolute 0.1 0.0 - 0.1 K/uL  I-stat troponin, ED   Collection Time: 09/17/15  3:51 PM  Result Value Ref Range   Troponin i, poc 0.00 0.00 - 0.08 ng/mL   Comment 3          Protime-INR   Collection Time: 09/17/15  4:04 PM  Result Value Ref Range   Prothrombin Time 13.3 11.4 - 15.2 seconds   INR 1.01   Digoxin level   Collection Time: 09/17/15  4:04 PM  Result Value Ref Range   Digoxin Level 0.6 (L) 0.8 - 2.0 ng/mL  Urinalysis, Routine w reflex microscopic (not at [facility])   Collection Time: 09/17/15  4:58 PM  Result Value Ref Range   Color, Urine AMBER (A) YELLOW   APPearance CLEAR CLEAR   Specific Gravity, Urine 1.029 1.005 - 1.030   pH 5.0 5.0 - 8.0   Glucose, UA >1000 (A) NEGATIVE mg/dL   Hgb urine dipstick TRACE (A) NEGATIVE   Bilirubin Urine NEGATIVE NEGATIVE   Ketones, ur 15 (A) NEGATIVE mg/dL   Protein, ur 30 (A) NEGATIVE mg/dL   Nitrite NEGATIVE NEGATIVE   Leukocytes, UA NEGATIVE NEGATIVE  Urine microscopic-add on   Collection Time: 09/17/15  4:58 PM  Result Value Ref  Range   Squamous Epithelial / LPF 6-30 (A) NONE SEEN   WBC, UA 0-5 0 - 5 WBC/hpf   RBC / HPF 0-5 0 - 5 RBC/hpf   Bacteria, UA FEW (A) NONE SEEN   Urine-Other MUCOUS PRESENT   MRSA PCR Screening   Collection Time: 09/17/15 10:20 PM  Result Value Ref Range   MRSA by PCR NEGATIVE NEGATIVE  Glucose, capillary   Collection Time: 09/17/15 10:26 PM  Result Value Ref Range   Glucose-Capillary 125 (H) 65 - 99 mg/dL  Glucose, capillary   Collection Time: 09/18/15  8:19 AM  Result Value Ref Range   Glucose-Capillary 157 (H) 65 - 99 mg/dL   Comment 1 Notify RN    Comment 2 Document in Chart   Glucose, capillary   Collection Time: 09/18/15 12:20 PM  Result Value Ref Range   Glucose-Capillary 230 (H) 65 - 99 mg/dL   Comment 1 Notify RN    Comment 2 Document in Chart   Glucose, capillary   Collection Time: 09/18/15  4:41 PM  Result Value Ref Range   Glucose-Capillary 252 (H) 65 - 99 mg/dL   Comment 1 Notify RN    Comment 2 Document in Chart   Glucose, capillary   Collection Time: 09/18/15  9:02 PM  Result Value Ref Range   Glucose-Capillary 220 (H) 65 - 99 mg/dL   Comment 1 Notify RN    Comment 2 Document in Chart   Glucose, capillary   Collection Time: 09/19/15  6:47 AM  Result Value Ref Range   Glucose-Capillary 157 (H) 65 - 99 mg/dL  Comment 1 Notify RN    Comment 2 Document in Chart   Hemoglobin A1c   Collection Time: 09/19/15  7:37 AM  Result Value Ref Range   Hgb A1c MFr Bld 7.9 (H) 4.8 - 5.6 %   Mean Plasma Glucose 180 mg/dL  Glucose, capillary   Collection Time: 09/19/15  9:19 AM  Result Value Ref Range   Glucose-Capillary 131 (H) 65 - 99 mg/dL  Glucose, capillary   Collection Time: 09/19/15 11:09 AM  Result Value Ref Range   Glucose-Capillary 121 (H) 65 - 99 mg/dL   Comment 1 Notify RN    Comment 2 Document in Chart   Glucose, capillary   Collection Time: 09/19/15  4:25 PM  Result Value Ref Range   Glucose-Capillary 273 (H) 65 - 99 mg/dL  Glucose, capillary    Collection Time: 09/19/15  9:12 PM  Result Value Ref Range   Glucose-Capillary 277 (H) 65 - 99 mg/dL   Comment 1 Notify RN    Comment 2 Document in Chart   Glucose, capillary   Collection Time: 09/20/15  6:21 AM  Result Value Ref Range   Glucose-Capillary 170 (H) 65 - 99 mg/dL   Comment 1 Notify RN    Comment 2 Document in Chart   Glucose, capillary   Collection Time: 09/20/15 11:11 AM  Result Value Ref Range   Glucose-Capillary 154 (H) 65 - 99 mg/dL   Comment 1 Notify RN    Comment 2 Document in Chart   Glucose, capillary   Collection Time: 09/20/15  4:16 PM  Result Value Ref Range   Glucose-Capillary 268 (H) 65 - 99 mg/dL  Protein electrophoresis, serum   Collection Time: 09/20/15  4:37 PM  Result Value Ref Range   Total Protein ELP 6.3 6.0 - 8.5 g/dL   Albumin ELP 3.4 2.9 - 4.4 g/dL   Alpha-1-Globulin 0.2 0.0 - 0.4 g/dL   Alpha-2-Globulin 0.7 0.4 - 1.0 g/dL   Beta Globulin 0.9 0.7 - 1.3 g/dL   Gamma Globulin 1.1 0.4 - 1.8 g/dL   M-Spike, % Not Observed Not Observed g/dL   SPE Interp. Comment    Comment Comment    GLOBULIN, TOTAL 2.9 2.2 - 3.9 g/dL   A/G Ratio 1.2 0.7 - 1.7  Glucose, capillary   Collection Time: 09/20/15  9:00 PM  Result Value Ref Range   Glucose-Capillary 335 (H) 65 - 99 mg/dL   Comment 1 Notify RN    Comment 2 Document in Chart   Glucose, capillary   Collection Time: 09/21/15  6:17 AM  Result Value Ref Range   Glucose-Capillary 222 (H) 65 - 99 mg/dL   Comment 1 Notify RN    Comment 2 Document in Chart   Glucose, capillary   Collection Time: 09/21/15 11:24 AM  Result Value Ref Range   Glucose-Capillary 183 (H) 65 - 99 mg/dL   Comment 1 Notify RN    Comment 2 Document in Chart   Glucose, capillary   Collection Time: 09/21/15  5:04 PM  Result Value Ref Range   Glucose-Capillary 230 (H) 65 - 99 mg/dL   Comment 1 Notify RN    Comment 2 Document in Chart   Glucose, capillary   Collection Time: 09/21/15  9:40 PM  Result Value Ref Range    Glucose-Capillary 295 (H) 65 - 99 mg/dL  Glucose, capillary   Collection Time: 09/22/15  6:03 AM  Result Value Ref Range   Glucose-Capillary 256 (H) 65 - 99 mg/dL  Comment 1 Notify RN    Comment 2 Document in Chart   Glucose, capillary   Collection Time: 09/22/15 11:48 AM  Result Value Ref Range   Glucose-Capillary 192 (H) 65 - 99 mg/dL  Glucose, capillary   Collection Time: 09/22/15  4:55 PM  Result Value Ref Range   Glucose-Capillary 293 (H) 65 - 99 mg/dL  Glucose, capillary   Collection Time: 09/22/15  9:59 PM  Result Value Ref Range   Glucose-Capillary 250 (H) 65 - 99 mg/dL   Comment 1 Notify RN    Comment 2 Document in Chart   Glucose, capillary   Collection Time: 09/23/15  6:24 AM  Result Value Ref Range   Glucose-Capillary 173 (H) 65 - 99 mg/dL   Comment 1 Notify RN    Comment 2 Document in Chart   Glucose, capillary   Collection Time: 09/23/15 11:45 AM  Result Value Ref Range   Glucose-Capillary 205 (H) 65 - 99 mg/dL  Glucose, capillary   Collection Time: 09/23/15  4:39 PM  Result Value Ref Range   Glucose-Capillary 249 (H) 65 - 99 mg/dL   Comment 1 QC Due   Glucose, capillary   Collection Time: 09/23/15 10:24 PM  Result Value Ref Range   Glucose-Capillary 112 (H) 65 - 99 mg/dL   Comment 1 Notify RN    Comment 2 Document in Chart   Glucose, capillary   Collection Time: 09/24/15  6:26 AM  Result Value Ref Range   Glucose-Capillary 200 (H) 65 - 99 mg/dL  Glucose, capillary   Collection Time: 09/24/15 10:58 AM  Result Value Ref Range   Glucose-Capillary 289 (H) 65 - 99 mg/dL   Comment 1 Notify RN    Comment 2 Document in Chart   Glucose, capillary   Collection Time: 09/24/15  4:14 PM  Result Value Ref Range   Glucose-Capillary 262 (H) 65 - 99 mg/dL   Comment 1 Notify RN    Comment 2 Document in Chart   Glucose, capillary   Collection Time: 09/24/15 10:23 PM  Result Value Ref Range   Glucose-Capillary 232 (H) 65 - 99 mg/dL   Comment 1 Notify RN     Comment 2 Document in Chart   CBC   Collection Time: 09/25/15  4:29 AM  Result Value Ref Range   WBC 10.0 4.0 - 10.5 K/uL   RBC 4.26 3.87 - 5.11 MIL/uL   Hemoglobin 12.6 12.0 - 15.0 g/dL   HCT 38.9 36.0 - 46.0 %   MCV 91.3 78.0 - 100.0 fL   MCH 29.6 26.0 - 34.0 pg   MCHC 32.4 30.0 - 36.0 g/dL   RDW 12.4 11.5 - 15.5 %   Platelets 166 150 - 400 K/uL  Basic metabolic panel   Collection Time: 09/25/15  4:29 AM  Result Value Ref Range   Sodium 137 135 - 145 mmol/L   Potassium 3.9 3.5 - 5.1 mmol/L   Chloride 104 101 - 111 mmol/L   CO2 26 22 - 32 mmol/L   Glucose, Bld 179 (H) 65 - 99 mg/dL   BUN 13 6 - 20 mg/dL   Creatinine, Ser 0.70 0.44 - 1.00 mg/dL   Calcium 10.3 8.9 - 10.3 mg/dL   GFR calc non Af Amer >60 >60 mL/min   GFR calc Af Amer >60 >60 mL/min   Anion gap 7 5 - 15  Glucose, capillary   Collection Time: 09/25/15  6:31 AM  Result Value Ref Range   Glucose-Capillary 147 (H)  65 - 99 mg/dL   Comment 1 Notify RN    Comment 2 Document in Chart   Glucose, capillary   Collection Time: 09/25/15 11:01 AM  Result Value Ref Range   Glucose-Capillary 159 (H) 65 - 99 mg/dL   Comment 1 Notify RN    Comment 2 Document in Chart   Glucose, capillary   Collection Time: 09/25/15  4:36 PM  Result Value Ref Range   Glucose-Capillary 206 (H) 65 - 99 mg/dL   Comment 1 Notify RN    Comment 2 Document in Chart   Glucose, capillary   Collection Time: 09/25/15  9:22 PM  Result Value Ref Range   Glucose-Capillary 185 (H) 65 - 99 mg/dL   Comment 1 Notify RN    Comment 2 Document in Chart   Glucose, capillary   Collection Time: 09/26/15  6:21 AM  Result Value Ref Range   Glucose-Capillary 99 65 - 99 mg/dL   Comment 1 Notify RN    Comment 2 Document in Chart   CBC   Collection Time: 09/26/15  6:28 AM  Result Value Ref Range   WBC 9.1 4.0 - 10.5 K/uL   RBC 4.47 3.87 - 5.11 MIL/uL   Hemoglobin 13.5 12.0 - 15.0 g/dL   HCT 40.8 36.0 - 46.0 %   MCV 91.3 78.0 - 100.0 fL   MCH 30.2 26.0 -  34.0 pg   MCHC 33.1 30.0 - 36.0 g/dL   RDW 12.3 11.5 - 15.5 %   Platelets 175 150 - 400 K/uL  Basic metabolic panel   Collection Time: 09/26/15  6:28 AM  Result Value Ref Range   Sodium 139 135 - 145 mmol/L   Potassium 4.2 3.5 - 5.1 mmol/L   Chloride 104 101 - 111 mmol/L   CO2 28 22 - 32 mmol/L   Glucose, Bld 101 (H) 65 - 99 mg/dL   BUN 7 6 - 20 mg/dL   Creatinine, Ser 0.61 0.44 - 1.00 mg/dL   Calcium 10.3 8.9 - 10.3 mg/dL   GFR calc non Af Amer >60 >60 mL/min   GFR calc Af Amer >60 >60 mL/min   Anion gap 7 5 - 15  Protein electrophoresis, serum   Collection Time: 09/26/15  6:28 AM  Result Value Ref Range   Total Protein ELP 6.1 6.0 - 8.5 g/dL   Albumin ELP 3.2 2.9 - 4.4 g/dL   Alpha-1-Globulin 0.2 0.0 - 0.4 g/dL   Alpha-2-Globulin 0.8 0.4 - 1.0 g/dL   Beta Globulin 0.9 0.7 - 1.3 g/dL   Gamma Globulin 1.0 0.4 - 1.8 g/dL   M-Spike, % Not Observed Not Observed g/dL   SPE Interp. Comment    Comment Comment    GLOBULIN, TOTAL 2.9 2.2 - 3.9 g/dL   A/G Ratio 1.1 0.7 - 1.7  Glucose, capillary   Collection Time: 09/26/15 11:36 AM  Result Value Ref Range   Glucose-Capillary 83 65 - 99 mg/dL   Comment 1 Notify RN    Comment 2 Document in Chart   Glucose, capillary   Collection Time: 09/26/15  4:30 PM  Result Value Ref Range   Glucose-Capillary 127 (H) 65 - 99 mg/dL   Comment 1 Notify RN    Comment 2 Document in Chart   Results for orders placed or performed during the hospital encounter of 08/15/15 (from the past 8736 hour(s))  Urinalysis, Routine w reflex microscopic (not at [facility])   Collection Time: 08/15/15  1:27 PM  Result Value Ref  Range   Color, Urine YELLOW YELLOW   APPearance CLEAR CLEAR   Specific Gravity, Urine 1.010 1.005 - 1.030   pH 7.5 5.0 - 8.0   Glucose, UA NEGATIVE NEGATIVE mg/dL   Hgb urine dipstick SMALL (A) NEGATIVE   Bilirubin Urine NEGATIVE NEGATIVE   Ketones, ur NEGATIVE NEGATIVE mg/dL   Protein, ur NEGATIVE NEGATIVE mg/dL   Nitrite NEGATIVE  NEGATIVE   Leukocytes, UA TRACE (A) NEGATIVE  Urine rapid drug screen (hosp performed)   Collection Time: 08/15/15  1:27 PM  Result Value Ref Range   Opiates NONE DETECTED NONE DETECTED   Cocaine NONE DETECTED NONE DETECTED   Benzodiazepines POSITIVE (A) NONE DETECTED   Amphetamines NONE DETECTED NONE DETECTED   Tetrahydrocannabinol NONE DETECTED NONE DETECTED   Barbiturates NONE DETECTED NONE DETECTED  Urine microscopic-add on   Collection Time: 08/15/15  1:27 PM  Result Value Ref Range   Squamous Epithelial / LPF 0-5 (A) NONE SEEN   WBC, UA 0-5 0 - 5 WBC/hpf   RBC / HPF 0-5 0 - 5 RBC/hpf   Bacteria, UA RARE (A) NONE SEEN  Comprehensive metabolic panel   Collection Time: 08/15/15  2:01 PM  Result Value Ref Range   Sodium 136 135 - 145 mmol/L   Potassium 3.5 3.5 - 5.1 mmol/L   Chloride 106 101 - 111 mmol/L   CO2 24 22 - 32 mmol/L   Glucose, Bld 145 (H) 65 - 99 mg/dL   BUN 6 6 - 20 mg/dL   Creatinine, Ser 0.72 0.44 - 1.00 mg/dL   Calcium 9.6 8.9 - 10.3 mg/dL   Total Protein 7.1 6.5 - 8.1 g/dL   Albumin 3.7 3.5 - 5.0 g/dL   AST 15 15 - 41 U/L   ALT 13 (L) 14 - 54 U/L   Alkaline Phosphatase 58 38 - 126 U/L   Total Bilirubin 0.5 0.3 - 1.2 mg/dL   GFR calc non Af Amer >60 >60 mL/min   GFR calc Af Amer >60 >60 mL/min   Anion gap 6 5 - 15  CBC   Collection Time: 08/15/15  2:01 PM  Result Value Ref Range   WBC 7.7 4.0 - 10.5 K/uL   RBC 4.29 3.87 - 5.11 MIL/uL   Hemoglobin 12.8 12.0 - 15.0 g/dL   HCT 38.3 36.0 - 46.0 %   MCV 89.3 78.0 - 100.0 fL   MCH 29.8 26.0 - 34.0 pg   MCHC 33.4 30.0 - 36.0 g/dL   RDW 12.3 11.5 - 15.5 %   Platelets 233 150 - 400 K/uL  Digoxin level   Collection Time: 08/15/15  2:01 PM  Result Value Ref Range   Digoxin Level 0.4 (L) 0.8 - 2.0 ng/mL  Ethanol   Collection Time: 08/15/15  2:27 PM  Result Value Ref Range   Alcohol, Ethyl (B) <5 <5 mg/dL  CBG monitoring, ED   Collection Time: 08/15/15 11:02 PM  Result Value Ref Range    Glucose-Capillary 226 (H) 65 - 99 mg/dL   Comment 1 Notify RN    Comment 2 Document in Chart   CBG monitoring, ED   Collection Time: 08/16/15  6:02 AM  Result Value Ref Range   Glucose-Capillary 161 (H) 65 - 99 mg/dL   Comment 1 Notify RN    Comment 2 Document in Chart   CBG monitoring, ED   Collection Time: 08/16/15  7:18 AM  Result Value Ref Range   Glucose-Capillary 152 (H) 65 - 99 mg/dL  CBG monitoring, ED   Collection Time: 08/16/15  9:48 PM  Result Value Ref Range   Glucose-Capillary 350 (H) 65 - 99 mg/dL  CBG monitoring, ED   Collection Time: 08/16/15 11:42 PM  Result Value Ref Range   Glucose-Capillary 270 (H) 65 - 99 mg/dL  CBG monitoring, ED   Collection Time: 08/17/15  7:55 AM  Result Value Ref Range   Glucose-Capillary 139 (H) 65 - 99 mg/dL  CBG monitoring, ED   Collection Time: 08/17/15  9:58 PM  Result Value Ref Range   Glucose-Capillary 320 (H) 65 - 99 mg/dL  CBG monitoring, ED   Collection Time: 08/18/15  9:32 AM  Result Value Ref Range   Glucose-Capillary 109 (H) 65 - 99 mg/dL  CBG monitoring, ED   Collection Time: 08/18/15  6:22 PM  Result Value Ref Range   Glucose-Capillary 206 (H) 65 - 99 mg/dL   Comment 1 Document in Chart    Assessment Axis Ischizophrenia Axis II deferred Axis III see medical history Axis IV mild to moderate  Axis V 60 Plan: I took her vitals.  I reviewed CC, tobacco/med/surg Hx, meds effects/ side effects, problem list, therapies and responses as well as current situation/symptoms discussed options. She'll continue Paxil at 40 mg each bedtime for depression.  trazodone can be increased to 200 mg each bedtime for sleep and continue Risperdal 2 mg each bedtime for schizophrenia.. She'll continue Xanax as needed for anxiety. Also be given Restoril 15 mg as needed for when she really has bad nights and can't sleep and return to see me in 3 months Her husband will call me if her situation worsens See orders and pt instructions for  more details.  MEDICATIONS this encounter: Meds ordered this encounter  Medications  . PARoxetine (PAXIL) 40 MG tablet    Sig: Take 1 tablet (40 mg total) by mouth daily.    Dispense:  90 tablet    Refill:  2  . risperiDONE (RISPERDAL) 2 MG tablet    Sig: Take 1 tablet (2 mg total) by mouth at bedtime.    Dispense:  90 tablet    Refill:  2  . traZODone (DESYREL) 100 MG tablet    Sig: Take one or two at bedtime for sleep    Dispense:  60 tablet    Refill:  2  . ALPRAZolam (XANAX) 0.25 MG tablet    Sig: Take 1 tablet (0.25 mg total) by mouth 3 (three) times daily.    Dispense:  90 tablet    Refill:  2  . temazepam (RESTORIL) 15 MG capsule    Sig: Take 1 capsule (15 mg total) by mouth at bedtime as needed for sleep.    Dispense:  30 capsule    Refill:  2    Medical Decision Making Problem Points:  Established problem, stable/improving (1), Established problem, worsening (2), Review of last therapy session (1) and Review of psycho-social stressors (1) Data Points:  Review or order medicine tests (1) Review of medication regiment & side effects (2) Review of new medications or change in dosage (2)  I certify that outpatient services furnished can reasonably be expected to improve the patient's condition.   Levonne Spiller, MD

## 2016-03-30 ENCOUNTER — Telehealth (HOSPITAL_COMMUNITY): Payer: Self-pay | Admitting: *Deleted

## 2016-03-30 ENCOUNTER — Other Ambulatory Visit (HOSPITAL_COMMUNITY): Payer: Self-pay | Admitting: Psychiatry

## 2016-03-30 MED ORDER — TRAZODONE HCL 100 MG PO TABS: ORAL_TABLET | ORAL | 0 refills | Status: DC

## 2016-03-30 NOTE — Telephone Encounter (Signed)
Ordered for 90 days.

## 2016-03-30 NOTE — Telephone Encounter (Signed)
noted 

## 2016-03-30 NOTE — Telephone Encounter (Signed)
Dr. Harrington Challenger pt pharmacy [facility] sent request to office for 90 days supply for pt Trazodone 100 mg 1-2 tablets QHS. Per pt chart, medication was sent to pharmacy on 03-19-2016 with 60 tabs 2 refills but due to pharmacy being mail order, a 3 months supply is needed. Pt pharmacy number is [phone] and fax number is [phone].

## 2016-06-10 ENCOUNTER — Encounter (HOSPITAL_COMMUNITY): Payer: Self-pay | Admitting: Psychiatry

## 2016-06-10 ENCOUNTER — Ambulatory Visit (INDEPENDENT_AMBULATORY_CARE_PROVIDER_SITE_OTHER): Payer: BLUE CROSS/BLUE SHIELD | Admitting: Psychiatry

## 2016-06-10 VITALS — BP 140/81 | HR 101 | Ht 68.0 in | Wt 150.0 lb

## 2016-06-10 DIAGNOSIS — Z79899 Other long term (current) drug therapy: Secondary | ICD-10-CM

## 2016-06-10 DIAGNOSIS — F200 Paranoid schizophrenia: Secondary | ICD-10-CM

## 2016-06-10 MED ORDER — RISPERIDONE 2 MG PO TABS: 2.0000 mg | ORAL_TABLET | Freq: Every day | ORAL | 2 refills | Status: DC

## 2016-06-10 MED ORDER — ALPRAZOLAM 0.25 MG PO TABS: 0.2500 mg | ORAL_TABLET | Freq: Three times a day (TID) | ORAL | 2 refills | Status: DC | PRN

## 2016-06-10 MED ORDER — PAROXETINE HCL 40 MG PO TABS: 40.0000 mg | ORAL_TABLET | Freq: Every day | ORAL | 2 refills | Status: DC

## 2016-06-10 MED ORDER — TRAZODONE HCL 50 MG PO TABS: 50.0000 mg | ORAL_TABLET | Freq: Every day | ORAL | 2 refills | Status: DC

## 2016-06-10 NOTE — Progress Notes (Signed)
Patient ID: Rachel [name], female   DOB: 06-10-1950, 66 y.o.   MRN: [id number] Patient ID: Rachel [name], female   DOB: 06-22-1950, 65 y.o.   MRN: [id number] Patient ID: Rachel [name], female   DOB: 10/03/1950, 66 y.o.   MRN: [id number] Patient ID: Rachel [name], female   DOB: [birth year], 66 y.o.   MRN: [id number] Patient ID: Rachel [name], female   DOB: 05/19/1950, 66 y.o.   MRN: [id number] Patient ID: Rachel [name], female   DOB: [birth year], 66 y.o.   MRN: [id number] Patient ID: Rachel [name], female   DOB: 12/09/1950, 66 y.o.   MRN: [id number] Patient ID: Rachel [name], female   DOB: 06-09-1950, 66 y.o.   MRN: [id number] Patient ID: Rachel [name], female   DOB: 12-30-1950, 66 y.o.   MRN: [id number] Patient ID: Rachel [name], female   DOB: [birth year], 66 y.o.   MRN: [id number] Patient ID: Rachel [name], female   DOB: 04/07/1950, 66 y.o.   MRN: [id number] Patient ID: Rachel [name], female   DOB: 11-Sep-1950, 66 y.o.   MRN: [id number] Patient ID: Rachel [name], female   DOB: 1951-01-05, 66 y.o.   MRN: [id number] Patient ID: Rachel [name], female   DOB: 09/07/1950, 66 y.o.   MRN: [id number] [facility] [id number] Progress Note Rachel [name] MRN: [id number] DOB: [birth year] Age: 66 y.o.  Date: 06/10/2016 Start Time: 1:18 PM End Time: 1:53 PM  Chief Complaint: Chief Complaint  Patient presents with  . Schizophrenia  . Follow-up   Subjective: "She Was in the hospital." This patient is a 66 year old married black female lives with her husband in [place]. She has not worked in years but is not on disability. She is a very poor historian   Apparently she has had mental illness for many years. At one point she was depressed but also had psychotic symptoms that sound congruent with schizophrenia. Her husband thinks she is doing pretty well on her current medications but she tends to sleep too much during the  day. We've looked at her med list and found that her trazodone might be too high. She does better if she only takes 100 mg instead of 200 mg. She also takes meclizine in the morning which may be contributing to drowsiness. Neither one of them remember why she is on lthis.  The patient denies being depressed but her energy is poor and she is drowsy. She doesn't sleep well she'll start to have visual hallucinations at night. This happens if she sleeps throughout the whole day. She denies auditory hallucinations or paranoia.  The patient returns after 3 months with her husband. She is in a wheelchair and is very zoned out does not make any eye contact and stares blankly at the ground. She is oriented to person and she knows who her husband has but could not answer questions regarding place date or situation. He states that she is no longer walking much on her own although she will at times. She has no interest in anything and is not eating and has lost about 24 pounds in the last several months. 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 Her last MRI last August and show any acute changes but she looks more unresponsive than I've ever seen her. She is supposed to see her neurologist next week. She is sleeping at night and throughout much of the day. I  told the husband to use Xanax now only if absolutely necessary and the same thing with the trazodone at night. He denies her voicing any auditory or visual hallucinations or paranoia she is actually not saying much at all      Past psychiatric history Patient has been seeing in this office since 1998.  She was admitted at [facility] due to severe depression and psychotic features.  At that time she was having suicidal thoughts .  Since then she has been stable on current psychiatric medication. Allergies: Allergies  Allergen Reactions  . Sulfa Antibiotics Anaphylaxis  . Ibuprofen Other (See  Comments)    unknown  . Shellfish Allergy Swelling    Mouth  Medical History: Past Medical History:  Diagnosis Date  . Blindness   . Diabetes mellitus type I ([place])   . GERD (gastroesophageal reflux disease)   . Hyperlipidemia   . Paroxysmal atrial fibrillation (H[facility])   . Schizophrenia ([place])   . Systemic hypertension   . Vertigo   Patient has history of vertigo, GERD, hyperlipidemia, diabetes mellitus.  Patient is also legally blind.  She see Dr. Karren Burly for her annual checkup and will follow up for elevated Hemo A1c with him. Surgical History: Past Surgical History:  Procedure Laterality Date  . BRAIN TUMOR EXCISION    . NM MYOCAR PERF WALL MOTION  02/01/2007   no significant ischemia  . US ECHOCARDIOGRAPHY  12/20/2003   mild mitral annular ca+,mild MR,TR,PI,AOV mildly sclerotic  Reviewed during this appointment. Vitals: BP 140/81   Pulse (!) 101   Ht 5\' 8"  (1.727 m)   Wt 150 lb (68 kg) Comment: pt could not stand for a long time  BMI 22.81 kg/m  Wt down almost 3 pounds.  Mental status examination Patient is casually dressed and fairly groomed. Patient is legally blind and is sitting in a wheelchair staring off into space. She did not answer any questions but continued to stare was difficult to assess her mood given that she is so unresponsive She described her mood as ok.  . She denies any auditory or visual hallucination husband reports that she gets these when she is tired  She has poverty of thought content.  She' is drowsy and not very alert and only oriented to person. There no tremors shakes or extrapyramidal side effects noted.  Lab Results:  Results for orders placed or performed during the hospital encounter of 09/17/15 (from the past 8736 hour(s))  Comprehensive metabolic panel   Collection Time: 09/17/15 12:31 PM  Result Value Ref Range   Sodium 137 135 - 145 mmol/L   Potassium 3.9 3.5 - 5.1 mmol/L   Chloride 105 101 - 111 mmol/L   CO2 25 22 - 32 mmol/L    Glucose, Bld 266 (H) 65 - 99 mg/dL   BUN 6 6 - 20 mg/dL   Creatinine, Ser 0.89 0.44 - 1.00 mg/dL   Calcium 10.2 8.9 - 10.3 mg/dL   Total Protein 6.7 6.5 - 8.1 g/dL   Albumin 3.8 3.5 - 5.0 g/dL   AST 19 15 - 41 U/L   ALT 15 14 - 54 U/L   Alkaline Phosphatase 56 38 - 126 U/L   Total Bilirubin 0.5 0.3 - 1.2 mg/dL   GFR calc non Af Amer >60 >60 mL/min   GFR calc Af Amer >60 >60 mL/min   Anion gap 7 5 - 15  CBC with Differential   Collection Time: 09/17/15 12:31 PM  Result Value Ref  Range   WBC 10.2 4.0 - 10.5 K/uL   RBC 4.60 3.87 - 5.11 MIL/uL   Hemoglobin 13.9 12.0 - 15.0 g/dL   HCT 42.0 36.0 - 46.0 %   MCV 91.3 78.0 - 100.0 fL   MCH 30.2 26.0 - 34.0 pg   MCHC 33.1 30.0 - 36.0 g/dL   RDW 12.8 11.5 - 15.5 %   Platelets 255 150 - 400 K/uL   Neutrophils Relative % 72 %   Neutro Abs 7.3 1.7 - 7.7 K/uL   Lymphocytes Relative 18 %   Lymphs Abs 1.9 0.7 - 4.0 K/uL   Monocytes Relative 8 %   Monocytes Absolute 0.8 0.1 - 1.0 K/uL   Eosinophils Relative 1 %   Eosinophils Absolute 0.1 0.0 - 0.7 K/uL   Basophils Relative 1 %   Basophils Absolute 0.1 0.0 - 0.1 K/uL  I-stat troponin, ED   Collection Time: 09/17/15  3:51 PM  Result Value Ref Range   Troponin i, poc 0.00 0.00 - 0.08 ng/mL   Comment 3          Protime-INR   Collection Time: 09/17/15  4:04 PM  Result Value Ref Range   Prothrombin Time 13.3 11.4 - 15.2 seconds   INR 1.01   Digoxin level   Collection Time: 09/17/15  4:04 PM  Result Value Ref Range   Digoxin Level 0.6 (L) 0.8 - 2.0 ng/mL  Urinalysis, Routine w reflex microscopic (not at [facility])   Collection Time: 09/17/15  4:58 PM  Result Value Ref Range   Color, Urine AMBER (A) YELLOW   APPearance CLEAR CLEAR   Specific Gravity, Urine 1.029 1.005 - 1.030   pH 5.0 5.0 - 8.0   Glucose, UA >1000 (A) NEGATIVE mg/dL   Hgb urine dipstick TRACE (A) NEGATIVE   Bilirubin Urine NEGATIVE NEGATIVE   Ketones, ur 15 (A) NEGATIVE mg/dL   Protein, ur 30 (A) NEGATIVE mg/dL    Nitrite NEGATIVE NEGATIVE   Leukocytes, UA NEGATIVE NEGATIVE  Urine microscopic-add on   Collection Time: 09/17/15  4:58 PM  Result Value Ref Range   Squamous Epithelial / LPF 6-30 (A) NONE SEEN   WBC, UA 0-5 0 - 5 WBC/hpf   RBC / HPF 0-5 0 - 5 RBC/hpf   Bacteria, UA FEW (A) NONE SEEN   Urine-Other MUCOUS PRESENT   MRSA PCR Screening   Collection Time: 09/17/15 10:20 PM  Result Value Ref Range   MRSA by PCR NEGATIVE NEGATIVE  Glucose, capillary   Collection Time: 09/17/15 10:26 PM  Result Value Ref Range   Glucose-Capillary 125 (H) 65 - 99 mg/dL  Glucose, capillary   Collection Time: 09/18/15  8:19 AM  Result Value Ref Range   Glucose-Capillary 157 (H) 65 - 99 mg/dL   Comment 1 Notify RN    Comment 2 Document in Chart   Glucose, capillary   Collection Time: 09/18/15 12:20 PM  Result Value Ref Range   Glucose-Capillary 230 (H) 65 - 99 mg/dL   Comment 1 Notify RN    Comment 2 Document in Chart   Glucose, capillary   Collection Time: 09/18/15  4:41 PM  Result Value Ref Range   Glucose-Capillary 252 (H) 65 - 99 mg/dL   Comment 1 Notify RN    Comment 2 Document in Chart   Glucose, capillary   Collection Time: 09/18/15  9:02 PM  Result Value Ref Range   Glucose-Capillary 220 (H) 65 - 99 mg/dL   Comment 1 Notify RN  Comment 2 Document in Chart   Glucose, capillary   Collection Time: 09/19/15  6:47 AM  Result Value Ref Range   Glucose-Capillary 157 (H) 65 - 99 mg/dL   Comment 1 Notify RN    Comment 2 Document in Chart   Hemoglobin A1c   Collection Time: 09/19/15  7:37 AM  Result Value Ref Range   Hgb A1c MFr Bld 7.9 (H) 4.8 - 5.6 %   Mean Plasma Glucose 180 mg/dL  Glucose, capillary   Collection Time: 09/19/15  9:19 AM  Result Value Ref Range   Glucose-Capillary 131 (H) 65 - 99 mg/dL  Glucose, capillary   Collection Time: 09/19/15 11:09 AM  Result Value Ref Range   Glucose-Capillary 121 (H) 65 - 99 mg/dL   Comment 1 Notify RN    Comment 2 Document in Chart    Glucose, capillary   Collection Time: 09/19/15  4:25 PM  Result Value Ref Range   Glucose-Capillary 273 (H) 65 - 99 mg/dL  Glucose, capillary   Collection Time: 09/19/15  9:12 PM  Result Value Ref Range   Glucose-Capillary 277 (H) 65 - 99 mg/dL   Comment 1 Notify RN    Comment 2 Document in Chart   Glucose, capillary   Collection Time: 09/20/15  6:21 AM  Result Value Ref Range   Glucose-Capillary 170 (H) 65 - 99 mg/dL   Comment 1 Notify RN    Comment 2 Document in Chart   Glucose, capillary   Collection Time: 09/20/15 11:11 AM  Result Value Ref Range   Glucose-Capillary 154 (H) 65 - 99 mg/dL   Comment 1 Notify RN    Comment 2 Document in Chart   Glucose, capillary   Collection Time: 09/20/15  4:16 PM  Result Value Ref Range   Glucose-Capillary 268 (H) 65 - 99 mg/dL  Protein electrophoresis, serum   Collection Time: 09/20/15  4:37 PM  Result Value Ref Range   Total Protein ELP 6.3 6.0 - 8.5 g/dL   Albumin ELP 3.4 2.9 - 4.4 g/dL   Alpha-1-Globulin 0.2 0.0 - 0.4 g/dL   Alpha-2-Globulin 0.7 0.4 - 1.0 g/dL   Beta Globulin 0.9 0.7 - 1.3 g/dL   Gamma Globulin 1.1 0.4 - 1.8 g/dL   M-Spike, % Not Observed Not Observed g/dL   SPE Interp. Comment    Comment Comment    GLOBULIN, TOTAL 2.9 2.2 - 3.9 g/dL   A/G Ratio 1.2 0.7 - 1.7  Glucose, capillary   Collection Time: 09/20/15  9:00 PM  Result Value Ref Range   Glucose-Capillary 335 (H) 65 - 99 mg/dL   Comment 1 Notify RN    Comment 2 Document in Chart   Glucose, capillary   Collection Time: 09/21/15  6:17 AM  Result Value Ref Range   Glucose-Capillary 222 (H) 65 - 99 mg/dL   Comment 1 Notify RN    Comment 2 Document in Chart   Glucose, capillary   Collection Time: 09/21/15 11:24 AM  Result Value Ref Range   Glucose-Capillary 183 (H) 65 - 99 mg/dL   Comment 1 Notify RN    Comment 2 Document in Chart   Glucose, capillary   Collection Time: 09/21/15  5:04 PM  Result Value Ref Range   Glucose-Capillary 230 (H) 65 - 99  mg/dL   Comment 1 Notify RN    Comment 2 Document in Chart   Glucose, capillary   Collection Time: 09/21/15  9:40 PM  Result Value Ref Range  Glucose-Capillary 295 (H) 65 - 99 mg/dL  Glucose, capillary   Collection Time: 09/22/15  6:03 AM  Result Value Ref Range   Glucose-Capillary 256 (H) 65 - 99 mg/dL   Comment 1 Notify RN    Comment 2 Document in Chart   Glucose, capillary   Collection Time: 09/22/15 11:48 AM  Result Value Ref Range   Glucose-Capillary 192 (H) 65 - 99 mg/dL  Glucose, capillary   Collection Time: 09/22/15  4:55 PM  Result Value Ref Range   Glucose-Capillary 293 (H) 65 - 99 mg/dL  Glucose, capillary   Collection Time: 09/22/15  9:59 PM  Result Value Ref Range   Glucose-Capillary 250 (H) 65 - 99 mg/dL   Comment 1 Notify RN    Comment 2 Document in Chart   Glucose, capillary   Collection Time: 09/23/15  6:24 AM  Result Value Ref Range   Glucose-Capillary 173 (H) 65 - 99 mg/dL   Comment 1 Notify RN    Comment 2 Document in Chart   Glucose, capillary   Collection Time: 09/23/15 11:45 AM  Result Value Ref Range   Glucose-Capillary 205 (H) 65 - 99 mg/dL  Glucose, capillary   Collection Time: 09/23/15  4:39 PM  Result Value Ref Range   Glucose-Capillary 249 (H) 65 - 99 mg/dL   Comment 1 QC Due   Glucose, capillary   Collection Time: 09/23/15 10:24 PM  Result Value Ref Range   Glucose-Capillary 112 (H) 65 - 99 mg/dL   Comment 1 Notify RN    Comment 2 Document in Chart   Glucose, capillary   Collection Time: 09/24/15  6:26 AM  Result Value Ref Range   Glucose-Capillary 200 (H) 65 - 99 mg/dL  Glucose, capillary   Collection Time: 09/24/15 10:58 AM  Result Value Ref Range   Glucose-Capillary 289 (H) 65 - 99 mg/dL   Comment 1 Notify RN    Comment 2 Document in Chart   Glucose, capillary   Collection Time: 09/24/15  4:14 PM  Result Value Ref Range   Glucose-Capillary 262 (H) 65 - 99 mg/dL   Comment 1 Notify RN    Comment 2 Document in Chart    Glucose, capillary   Collection Time: 09/24/15 10:23 PM  Result Value Ref Range   Glucose-Capillary 232 (H) 65 - 99 mg/dL   Comment 1 Notify RN    Comment 2 Document in Chart   CBC   Collection Time: 09/25/15  4:29 AM  Result Value Ref Range   WBC 10.0 4.0 - 10.5 K/uL   RBC 4.26 3.87 - 5.11 MIL/uL   Hemoglobin 12.6 12.0 - 15.0 g/dL   HCT 38.9 36.0 - 46.0 %   MCV 91.3 78.0 - 100.0 fL   MCH 29.6 26.0 - 34.0 pg   MCHC 32.4 30.0 - 36.0 g/dL   RDW 12.4 11.5 - 15.5 %   Platelets 166 150 - 400 K/uL  Basic metabolic panel   Collection Time: 09/25/15  4:29 AM  Result Value Ref Range   Sodium 137 135 - 145 mmol/L   Potassium 3.9 3.5 - 5.1 mmol/L   Chloride 104 101 - 111 mmol/L   CO2 26 22 - 32 mmol/L   Glucose, Bld 179 (H) 65 - 99 mg/dL   BUN 13 6 - 20 mg/dL   Creatinine, Ser 0.70 0.44 - 1.00 mg/dL   Calcium 10.3 8.9 - 10.3 mg/dL   GFR calc non Af Amer >60 >60 mL/min   GFR calc  Af Amer >60 >60 mL/min   Anion gap 7 5 - 15  Glucose, capillary   Collection Time: 09/25/15  6:31 AM  Result Value Ref Range   Glucose-Capillary 147 (H) 65 - 99 mg/dL   Comment 1 Notify RN    Comment 2 Document in Chart   Glucose, capillary   Collection Time: 09/25/15 11:01 AM  Result Value Ref Range   Glucose-Capillary 159 (H) 65 - 99 mg/dL   Comment 1 Notify RN    Comment 2 Document in Chart   Glucose, capillary   Collection Time: 09/25/15  4:36 PM  Result Value Ref Range   Glucose-Capillary 206 (H) 65 - 99 mg/dL   Comment 1 Notify RN    Comment 2 Document in Chart   Glucose, capillary   Collection Time: 09/25/15  9:22 PM  Result Value Ref Range   Glucose-Capillary 185 (H) 65 - 99 mg/dL   Comment 1 Notify RN    Comment 2 Document in Chart   Glucose, capillary   Collection Time: 09/26/15  6:21 AM  Result Value Ref Range   Glucose-Capillary 99 65 - 99 mg/dL   Comment 1 Notify RN    Comment 2 Document in Chart   CBC   Collection Time: 09/26/15  6:28 AM  Result Value Ref Range   WBC 9.1  4.0 - 10.5 K/uL   RBC 4.47 3.87 - 5.11 MIL/uL   Hemoglobin 13.5 12.0 - 15.0 g/dL   HCT 40.8 36.0 - 46.0 %   MCV 91.3 78.0 - 100.0 fL   MCH 30.2 26.0 - 34.0 pg   MCHC 33.1 30.0 - 36.0 g/dL   RDW 12.3 11.5 - 15.5 %   Platelets 175 150 - 400 K/uL  Basic metabolic panel   Collection Time: 09/26/15  6:28 AM  Result Value Ref Range   Sodium 139 135 - 145 mmol/L   Potassium 4.2 3.5 - 5.1 mmol/L   Chloride 104 101 - 111 mmol/L   CO2 28 22 - 32 mmol/L   Glucose, Bld 101 (H) 65 - 99 mg/dL   BUN 7 6 - 20 mg/dL   Creatinine, Ser 0.61 0.44 - 1.00 mg/dL   Calcium 10.3 8.9 - 10.3 mg/dL   GFR calc non Af Amer >60 >60 mL/min   GFR calc Af Amer >60 >60 mL/min   Anion gap 7 5 - 15  Protein electrophoresis, serum   Collection Time: 09/26/15  6:28 AM  Result Value Ref Range   Total Protein ELP 6.1 6.0 - 8.5 g/dL   Albumin ELP 3.2 2.9 - 4.4 g/dL   Alpha-1-Globulin 0.2 0.0 - 0.4 g/dL   Alpha-2-Globulin 0.8 0.4 - 1.0 g/dL   Beta Globulin 0.9 0.7 - 1.3 g/dL   Gamma Globulin 1.0 0.4 - 1.8 g/dL   M-Spike, % Not Observed Not Observed g/dL   SPE Interp. Comment    Comment Comment    GLOBULIN, TOTAL 2.9 2.2 - 3.9 g/dL   A/G Ratio 1.1 0.7 - 1.7  Glucose, capillary   Collection Time: 09/26/15 11:36 AM  Result Value Ref Range   Glucose-Capillary 83 65 - 99 mg/dL   Comment 1 Notify RN    Comment 2 Document in Chart   Glucose, capillary   Collection Time: 09/26/15  4:30 PM  Result Value Ref Range   Glucose-Capillary 127 (H) 65 - 99 mg/dL   Comment 1 Notify RN    Comment 2 Document in Chart   Results for orders placed  or performed during the hospital encounter of 08/15/15 (from the past 8736 hour(s))  Urinalysis, Routine w reflex microscopic (not at [facility])   Collection Time: 08/15/15  1:27 PM  Result Value Ref Range   Color, Urine YELLOW YELLOW   APPearance CLEAR CLEAR   Specific Gravity, Urine 1.010 1.005 - 1.030   pH 7.5 5.0 - 8.0   Glucose, UA NEGATIVE NEGATIVE mg/dL   Hgb urine dipstick  SMALL (A) NEGATIVE   Bilirubin Urine NEGATIVE NEGATIVE   Ketones, ur NEGATIVE NEGATIVE mg/dL   Protein, ur NEGATIVE NEGATIVE mg/dL   Nitrite NEGATIVE NEGATIVE   Leukocytes, UA TRACE (A) NEGATIVE  Urine rapid drug screen (hosp performed)   Collection Time: 08/15/15  1:27 PM  Result Value Ref Range   Opiates NONE DETECTED NONE DETECTED   Cocaine NONE DETECTED NONE DETECTED   Benzodiazepines POSITIVE (A) NONE DETECTED   Amphetamines NONE DETECTED NONE DETECTED   Tetrahydrocannabinol NONE DETECTED NONE DETECTED   Barbiturates NONE DETECTED NONE DETECTED  Urine microscopic-add on   Collection Time: 08/15/15  1:27 PM  Result Value Ref Range   Squamous Epithelial / LPF 0-5 (A) NONE SEEN   WBC, UA 0-5 0 - 5 WBC/hpf   RBC / HPF 0-5 0 - 5 RBC/hpf   Bacteria, UA RARE (A) NONE SEEN  Comprehensive metabolic panel   Collection Time: 08/15/15  2:01 PM  Result Value Ref Range   Sodium 136 135 - 145 mmol/L   Potassium 3.5 3.5 - 5.1 mmol/L   Chloride 106 101 - 111 mmol/L   CO2 24 22 - 32 mmol/L   Glucose, Bld 145 (H) 65 - 99 mg/dL   BUN 6 6 - 20 mg/dL   Creatinine, Ser 0.72 0.44 - 1.00 mg/dL   Calcium 9.6 8.9 - 10.3 mg/dL   Total Protein 7.1 6.5 - 8.1 g/dL   Albumin 3.7 3.5 - 5.0 g/dL   AST 15 15 - 41 U/L   ALT 13 (L) 14 - 54 U/L   Alkaline Phosphatase 58 38 - 126 U/L   Total Bilirubin 0.5 0.3 - 1.2 mg/dL   GFR calc non Af Amer >60 >60 mL/min   GFR calc Af Amer >60 >60 mL/min   Anion gap 6 5 - 15  CBC   Collection Time: 08/15/15  2:01 PM  Result Value Ref Range   WBC 7.7 4.0 - 10.5 K/uL   RBC 4.29 3.87 - 5.11 MIL/uL   Hemoglobin 12.8 12.0 - 15.0 g/dL   HCT 38.3 36.0 - 46.0 %   MCV 89.3 78.0 - 100.0 fL   MCH 29.8 26.0 - 34.0 pg   MCHC 33.4 30.0 - 36.0 g/dL   RDW 12.3 11.5 - 15.5 %   Platelets 233 150 - 400 K/uL  Digoxin level   Collection Time: 08/15/15  2:01 PM  Result Value Ref Range   Digoxin Level 0.4 (L) 0.8 - 2.0 ng/mL  Ethanol   Collection Time: 08/15/15  2:27 PM   Result Value Ref Range   Alcohol, Ethyl (B) <5 <5 mg/dL  CBG monitoring, ED   Collection Time: 08/15/15 11:02 PM  Result Value Ref Range   Glucose-Capillary 226 (H) 65 - 99 mg/dL   Comment 1 Notify RN    Comment 2 Document in Chart   CBG monitoring, ED   Collection Time: 08/16/15  6:02 AM  Result Value Ref Range   Glucose-Capillary 161 (H) 65 - 99 mg/dL   Comment 1 Notify RN  Comment 2 Document in Chart   CBG monitoring, ED   Collection Time: 08/16/15  7:18 AM  Result Value Ref Range   Glucose-Capillary 152 (H) 65 - 99 mg/dL  CBG monitoring, ED   Collection Time: 08/16/15  9:48 PM  Result Value Ref Range   Glucose-Capillary 350 (H) 65 - 99 mg/dL  CBG monitoring, ED   Collection Time: 08/16/15 11:42 PM  Result Value Ref Range   Glucose-Capillary 270 (H) 65 - 99 mg/dL  CBG monitoring, ED   Collection Time: 08/17/15  7:55 AM  Result Value Ref Range   Glucose-Capillary 139 (H) 65 - 99 mg/dL  CBG monitoring, ED   Collection Time: 08/17/15  9:58 PM  Result Value Ref Range   Glucose-Capillary 320 (H) 65 - 99 mg/dL  CBG monitoring, ED   Collection Time: 08/18/15  9:32 AM  Result Value Ref Range   Glucose-Capillary 109 (H) 65 - 99 mg/dL  CBG monitoring, ED   Collection Time: 08/18/15  6:22 PM  Result Value Ref Range   Glucose-Capillary 206 (H) 65 - 99 mg/dL   Comment 1 Document in Chart    Assessment Axis Ischizophrenia Axis II deferred Axis III see medical history Axis IV mild to moderate  Axis V 60 Plan: I took her vitals.  I reviewed CC, tobacco/med/surg Hx, meds effects/ side effects, problem list, therapies and responses as well as current situation/symptoms discussed options. She'll continue Paxil at 40 mg each bedtime for depressionAnd Risperdal 2 mg at bedtime for schizophrenia. I've told the husband to only use Xanax and trazodone if absolutely necessary. I will send her neurologist and note voicing my concerns regarding her severe lethargy. She probably needs  more work workup such as a new brain MRI but if nothing organic and be found it could be that she has become depressed and will need a change in her antidepressant. She'll return to see me in 6 weeks.Her husband will call me if her situation worsens See orders and pt instructions for more details.  MEDICATIONS this encounter: Meds ordered this encounter  Medications  . traZODone (DESYREL) 50 MG tablet    Sig: Take 1 tablet (50 mg total) by mouth at bedtime.    Dispense:  90 tablet    Refill:  2  . PARoxetine (PAXIL) 40 MG tablet    Sig: Take 1 tablet (40 mg total) by mouth daily.    Dispense:  90 tablet    Refill:  2  . risperiDONE (RISPERDAL) 2 MG tablet    Sig: Take 1 tablet (2 mg total) by mouth at bedtime.    Dispense:  90 tablet    Refill:  2  . ALPRAZolam (XANAX) 0.25 MG tablet    Sig: Take 1 tablet (0.25 mg total) by mouth 3 (three) times daily as needed for anxiety.    Dispense:  90 tablet    Refill:  2    Medical Decision Making Problem Points:  Established problem, stable/improving (1), Established problem, worsening (2), Review of last therapy session (1) and Review of psycho-social stressors (1) Data Points:  Review or order medicine tests (1) Review of medication regiment & side effects (2) Review of new medications or change in dosage (2)  I certify that outpatient services furnished can reasonably be expected to improve the patient's condition.   Levonne Spiller, MD

## 2016-06-16 ENCOUNTER — Ambulatory Visit (INDEPENDENT_AMBULATORY_CARE_PROVIDER_SITE_OTHER): Payer: BLUE CROSS/BLUE SHIELD | Admitting: Nurse Practitioner

## 2016-06-16 ENCOUNTER — Encounter: Payer: Self-pay | Admitting: Nurse Practitioner

## 2016-06-16 VITALS — BP 125/78 | HR 82 | Ht 68.0 in | Wt 156.6 lb

## 2016-06-16 DIAGNOSIS — I10 Essential (primary) hypertension: Secondary | ICD-10-CM

## 2016-06-16 DIAGNOSIS — E785 Hyperlipidemia, unspecified: Secondary | ICD-10-CM

## 2016-06-16 DIAGNOSIS — R5383 Other fatigue: Secondary | ICD-10-CM | POA: Diagnosis not present

## 2016-06-16 DIAGNOSIS — R400 Somnolence: Secondary | ICD-10-CM | POA: Diagnosis not present

## 2016-06-16 DIAGNOSIS — I609 Nontraumatic subarachnoid hemorrhage, unspecified: Secondary | ICD-10-CM | POA: Diagnosis not present

## 2016-06-16 NOTE — Progress Notes (Signed)
I agree with the above plan 

## 2016-06-16 NOTE — Progress Notes (Signed)
[facility]  PATIENT: Rachel [name] DOB: March 26, 1950   REASON FOR VISIT: Follow-up for left frontotemporal subarachnoid hemorrhage, new complaints of fatigue HISTORY FROM: Patient and husband Johnny    HISTORY OF PRESENT ILLNESS:Rachel [name] is a 66 year old pleasant African-American lady seen today for first office follow-up visit following hospital admission for right leg weakness in August 2017. Rachel [name] an 66 y.o.femalewith a history of Rosai-Dorfman disease, resection of an occipital mass lesion, hypertension, paroxysmal atrial fibrillation, hyperlipidemia, schizophrenia and diabetes mellitus, presenting with new onset weakness involving right leg for 2 days. She has no previous history of stroke no TIA. She's been taking aspirin 81 mg per day. MRI of the brain showed findings indicative of acute to subacute left frontoparietal subarachnoid hemorrhage, and addition to enhancing mass lesions in the left middle cranial fossa and left lateral temporal region. Plaque-like enhancement was noted involving anterior middle cranial fossa on the right, as well as an enhancing lesion involving right optic nerve. Postsurgical changes in the occipital region noted. Patient has had no change in speech and no symptoms involving the right upper extremity. She has chronic right facial weakness which is unchanged.MRI showedL frontoparietal medial SAH. Her home ASA discontinued. BP and glucose were controlled. PT/OT recommended CIR first and then changed to SNF. On 09/23/15 found to have right arm weakness, husband stated that she had on and off right arm weakness. EEG negative. Repeat MRI no change. Continued PT/OT. Transfer to SNF for continued rehab.She was seen on consultation by neurosurgery who felt that the patient subarachnoid hemorrhage was probably related to unrecognized, and not due to aneurysm or AVM and hence further vascular imaging was not done. Patient  remained stable and has been discharged from rehabilitation and has even finish home physical and occupation therapy. She is able to walk without assistance but feels her legs often drags on her balance is not the same. She's had a few minor falls but fortunately no major injuries. She has been advised to use a cane but she refuses to do so. Follow-up MRI scan of the brain showed stable appearance of the   subarachnoid hemorrhage as well as the brain lesions. She has a known history of long-standing Rosai Dorfman  disease and had undergone occipital craniotomy for removal of one such lesion and has had stable appearance of follow-up meningeal lesions in the left middle cranial fossa  UPDATE 05/18/2018CM Rachel [name], 66 year old female returns for follow-up with her husband. She has a history of subarachnoid hemorrhage. She is back on low-dose aspirin 0.81 daily. According to the husband her blood sugars run around 150-60. This is followed by Dr. Berdine Addison.  She also has medical history of hypertension, paroxysmal atrial fibrillation, hyperlipidemia, schizophrenia. She is seen by behavioral health. Blood pressure in the office today 125/78. She remains on Crestor hyperlipidemia. She does not ambulate in the home unless the husband is holding onto her. She refuses to use a cane or walker. Her physical therapy has concluded however she does not home exercise program.She has a known history of long-standing Rosai Dorfman  disease and had undergone occipital craniotomy for removal of one such lesion and has had stable appearance of follow-up meningeal lesions in the left middle cranial fossa she returns for reevaluation. Husband also reports that she has significant fatigue and daytime drowsiness. She snores at night. She has never had a sleep study. REVIEW OF SYSTEMS: Full 14 system review of systems performed and notable only for those  listed, all others are neg:  Constitutional: neg  Cardiovascular:  neg Ear/Nose/Throat: neg  Skin: neg Eyes: Loss of vision in right eye Respiratory: neg Gastroitestinal: neg  Hematology/Lymphatic: neg  Endocrine: neg Musculoskeletal: Walking difficulty Allergy/Immunology: neg Neurological: neg Psychiatric: neg Sleep : Daytime sleepiness, snoring ALLERGIES: Allergies  Allergen Reactions  . Sulfa Antibiotics Anaphylaxis  . Ibuprofen Other (See Comments)    unknown  . Shellfish Allergy Swelling    Mouth    HOME MEDICATIONS: Outpatient Medications Prior to Visit  Medication Sig Dispense Refill  . acetaminophen (TYLENOL) 500 MG tablet Take 1,000 mg by mouth every 6 (six) hours as needed for mild pain.    Marland Kitchen ALPRAZolam (XANAX) 0.25 MG tablet Take 1 tablet (0.25 mg total) by mouth 3 (three) times daily as needed for anxiety. 90 tablet 2  . digoxin (LANOXIN) 0.125 MG tablet Take 1 tablet (0.125 mg total) by mouth daily. 90 tablet 3  . diltiazem (CARDIZEM CD) 180 MG 24 hr capsule Take 1 capsule (180 mg total) by mouth daily. 90 capsule 3  . famotidine (PEPCID) 20 MG tablet Take 20 mg by mouth daily.     . insulin NPH Human (HUMULIN N,NOVOLIN N) 100 UNIT/ML injection Inject 0.18 mLs (18 Units total) into the skin 2 (two) times daily at 8 am and 10 pm. 10 mL 11  . KLOR-CON 10 10 MEQ tablet Take 10 mEq by mouth daily.     Marland Kitchen loratadine (CLARITIN) 10 MG tablet Take 10 mg by mouth daily as needed for allergies.   1  . PARoxetine (PAXIL) 40 MG tablet Take 1 tablet (40 mg total) by mouth daily. 90 tablet 2  . risperiDONE (RISPERDAL) 2 MG tablet Take 1 tablet (2 mg total) by mouth at bedtime. 90 tablet 2  . rosuvastatin (CRESTOR) 10 MG tablet Take 1 tablet (10 mg total) by mouth every evening. 90 tablet 3  . temazepam (RESTORIL) 15 MG capsule Take 1 capsule (15 mg total) by mouth at bedtime as needed for sleep. 30 capsule 2  . traZODone (DESYREL) 50 MG tablet Take 1 tablet (50 mg total) by mouth at bedtime. 90 tablet 2  . metFORMIN (GLUCOPHAGE) 500 MG tablet  Take 1 tablet (500 mg total) by mouth 2 (two) times daily with a meal. (Patient not taking: Reported on 06/16/2016) 60 tablet 2   Facility-Administered Medications Prior to Visit  Medication Dose Route Frequency Provider Last Rate Last Dose  . aspirin EC tablet 81 mg  81 mg Oral Daily Garvin Fila, MD        PAST MEDICAL HISTORY: Past Medical History:  Diagnosis Date  . Blindness   . Diabetes mellitus type I ([place])   . GERD (gastroesophageal reflux disease)   . Hyperlipidemia   . Paroxysmal atrial fibrillation ([facility])   . Schizophrenia ([place])   . Systemic hypertension   . Vertigo     PAST SURGICAL HISTORY: Past Surgical History:  Procedure Laterality Date  . BRAIN TUMOR EXCISION    . NM MYOCAR PERF WALL MOTION  02/01/2007   no significant ischemia  . US ECHOCARDIOGRAPHY  12/20/2003   mild mitral annular ca+,mild MR,TR,PI,AOV mildly sclerotic    FAMILY HISTORY: Family History  Problem Relation Age of Onset  . ADD / ADHD Neg Hx   . Alcohol abuse Neg Hx   . Drug abuse Neg Hx   . Anxiety disorder Neg Hx   . Bipolar disorder Neg Hx   . Dementia Neg Hx   .  Depression Neg Hx   . OCD Neg Hx   . Paranoid behavior Neg Hx   . Schizophrenia Neg Hx   . Seizures Neg Hx   . Sexual abuse Neg Hx   . Physical abuse Neg Hx     SOCIAL HISTORY: Social History   Social History  . Marital status: Married    Spouse name: N/A  . Number of children: N/A  . Years of education: N/A   Occupational History  . Not on file.   Social History Main Topics  . Smoking status: Never Smoker  . Smokeless tobacco: Never Used  . Alcohol use No  . Drug use: No  . Sexual activity: Not on file   Other Topics Concern  . Not on file   Social History Narrative   Living home with husband, [name].  Wheelchair bound, Husband walks her around house.  Children Grown 2.  Caffiene- rare.       PHYSICAL EXAM  Vitals:   06/16/16 1325  BP: 125/78  Pulse: 82  Weight: 156 lb 9.6 oz (71 kg)   Height: 5\' 8"  (1.727 m)   Body mass index is 23.81 kg/m.  Generalized: Well developed, in no acute distress  Head: normocephalic and atraumatic,. Oropharynx benign  Neck: Supple, no carotid bruits  Cardiac: Regular rate rhythm, no murmur  Musculoskeletal: No deformity   Neurological examination   Mentation: Alert oriented to time, place, history taking. Attention span and concentration appropriate. Recent and remote memory intact.  Follows all commands speech and language fluent.   Cranial nerve II-XII: Pupils were equal round reactive to light extraocular movements were full, visual field were full on confrontational test. Facial sensation and strength were normal. hearing was intact to finger rubbing bilaterally. Uvula tongue midline. head turning and shoulder shrug were normal and symmetric.Tongue protrusion into cheek strength was normal. Motor: normal bulk and tone, full strength in the BUE, BLE, fine finger movements normal, no pronator drift. Poor effort with exam Sensory: normal and symmetric to light touch, pinprick, and  Vibration, in the upper and lower extremities  Coordination: finger-nose-finger, no dysmetria Reflexes: 1+ upper lower and symmetric, plantar responses were flexor bilaterally. Gait and Station: Rising up from seated position with husband holding onto both arms, slight imbalance and dragging her right leg unsteady gait unable to heel toe or tandem walk   DIAGNOSTIC DATA (LABS, IMAGING, TESTING) - I reviewed patient records, labs, notes, testing and imaging myself where available.  Lab Results  Component Value Date   WBC 9.1 09/26/2015   HGB 13.5 09/26/2015   HCT 40.8 09/26/2015   MCV 91.3 09/26/2015   PLT 175 09/26/2015      Component Value Date/Time   NA 139 09/26/2015 0628   K 4.2 09/26/2015 0628   CL 104 09/26/2015 0628   CO2 28 09/26/2015 0628   GLUCOSE 101 (H) 09/26/2015 0628   BUN 7 09/26/2015 0628   CREATININE 0.61 09/26/2015 0628    CREATININE 0.81 09/11/2013 1101   CALCIUM 10.3 09/26/2015 0628   PROT 6.7 09/17/2015 1231   ALBUMIN 3.8 09/17/2015 1231   AST 19 09/17/2015 1231   ALT 15 09/17/2015 1231   ALKPHOS 56 09/17/2015 1231   BILITOT 0.5 09/17/2015 1231   GFRNONAA >60 09/26/2015 0628   GFRAA >60 09/26/2015 0628   Lab Results  Component Value Date   CHOL 127 09/11/2013   HDL 59 09/11/2013   LDLCALC 59 09/11/2013   TRIG 45 09/11/2013   CHOLHDL 2.2  09/11/2013   Lab Results  Component Value Date   HGBA1C 7.9 (H) 09/19/2015      ASSESSMENT AND PLAN 33 year African-American lady with small left medial frontal subarachnoid hemorrhage possibly related to trauma of unclear etiology. Long-standing history of Rosai Dorfman disease which appears stable. She still has mild gait and balance difficulties but is otherwise doing well. She complains of ongoing fatigue and daytime drowsiness.  Continue aspirin .81mg  daily  Use the assistance of 1 for ambulation at risk for falls due to gait disturbance For daytime drowsiness and snoring recommend sleep study, husband will call back Try to perform chair exercises Follow up 6 months I explained in particular the risks and ramifications of untreated moderate to severe OSA, especially with respect to cardiovascular disease  including congestive heart failure, difficult to treat hypertension, cardiac arrhythmias, or stroke. Even type 2 diabetes has, in part, been linked to untreated OSA. Symptoms of untreated OSA include daytime sleepiness, memory problems, mood irritability and mood disorder such as depression and anxiety, lack of energy, as well as recurrent headaches, especially morning headaches. We talked about trying to maintain a healthy lifestyle in general, as well as the importance of weight control. I encouraged the patient to eat healthy, exercise daily and keep well hydrated, to keep a scheduled bedtime and wake time routine, to not skip any meals and eat healthy  snacks in between meals I spent 25 min  in total face to face time with the patient and husband more than 50% of which was spent counseling and coordination of care, reviewing test results reviewing medications and discussing and reviewing the diagnosis of untreated obstructive sleep apnea and further treatment options. ,  Rayburn Ma, [facility], APRN  [facility] [address] [place], [place] [address] [phone]

## 2016-06-16 NOTE — Patient Instructions (Addendum)
Continue aspirin .81mg  daily  Use the assistance of 1 for ambulation at risk for falls due to gait disturbance For daytime drowsiness and snoring recommend sleep study husband will call back Try to perform chair exercises Follow up 6 months I explained in particular the risks and ramifications of untreated moderate to severe OSA, especially with respect to cardiovascular disease  including congestive heart failure, difficult to treat hypertension, cardiac arrhythmias, or stroke. Even type 2 diabetes has, in part, been linked to untreated OSA. Symptoms of untreated OSA include daytime sleepiness, memory problems, mood irritability and mood disorder such as depression and anxiety, lack of energy, as well as recurrent headaches, especially morning headaches. We talked about trying to maintain a healthy lifestyle in general, as well as the importance of weight control. I encouraged the patient to eat healthy, exercise daily and keep well hydrated, to keep a scheduled bedtime and wake time routine, to not skip any meals and eat healthy snacks in between meals

## 2016-06-30 ENCOUNTER — Other Ambulatory Visit (HOSPITAL_COMMUNITY): Payer: Self-pay | Admitting: Psychiatry

## 2016-07-22 ENCOUNTER — Encounter (HOSPITAL_COMMUNITY): Payer: Self-pay | Admitting: Psychiatry

## 2016-07-22 ENCOUNTER — Ambulatory Visit (INDEPENDENT_AMBULATORY_CARE_PROVIDER_SITE_OTHER): Payer: BLUE CROSS/BLUE SHIELD | Admitting: Psychiatry

## 2016-07-22 VITALS — BP 138/84 | HR 99 | Wt 145.4 lb

## 2016-07-22 DIAGNOSIS — E785 Hyperlipidemia, unspecified: Secondary | ICD-10-CM | POA: Diagnosis not present

## 2016-07-22 DIAGNOSIS — F209 Schizophrenia, unspecified: Secondary | ICD-10-CM

## 2016-07-22 DIAGNOSIS — Z882 Allergy status to sulfonamides status: Secondary | ICD-10-CM | POA: Diagnosis not present

## 2016-07-22 DIAGNOSIS — R42 Dizziness and giddiness: Secondary | ICD-10-CM

## 2016-07-22 DIAGNOSIS — Z91013 Allergy to seafood: Secondary | ICD-10-CM

## 2016-07-22 DIAGNOSIS — Z79899 Other long term (current) drug therapy: Secondary | ICD-10-CM | POA: Diagnosis not present

## 2016-07-22 DIAGNOSIS — Z886 Allergy status to analgesic agent status: Secondary | ICD-10-CM

## 2016-07-22 DIAGNOSIS — K219 Gastro-esophageal reflux disease without esophagitis: Secondary | ICD-10-CM | POA: Diagnosis not present

## 2016-07-22 DIAGNOSIS — F200 Paranoid schizophrenia: Secondary | ICD-10-CM

## 2016-07-22 DIAGNOSIS — E119 Type 2 diabetes mellitus without complications: Secondary | ICD-10-CM

## 2016-07-22 MED ORDER — RISPERIDONE 2 MG PO TABS: 2.0000 mg | ORAL_TABLET | Freq: Every day | ORAL | 2 refills | Status: DC

## 2016-07-22 MED ORDER — PAROXETINE HCL 40 MG PO TABS: 40.0000 mg | ORAL_TABLET | Freq: Every day | ORAL | 2 refills | Status: DC

## 2016-07-22 MED ORDER — TRAZODONE HCL 50 MG PO TABS: 50.0000 mg | ORAL_TABLET | Freq: Every day | ORAL | 2 refills | Status: DC

## 2016-07-22 MED ORDER — ALPRAZOLAM 0.25 MG PO TABS: 0.2500 mg | ORAL_TABLET | Freq: Three times a day (TID) | ORAL | 2 refills | Status: DC | PRN

## 2016-07-22 MED ORDER — TEMAZEPAM 15 MG PO CAPS: 15.0000 mg | ORAL_CAPSULE | Freq: Every evening | ORAL | 2 refills | Status: DC | PRN

## 2016-07-22 NOTE — Progress Notes (Signed)
Patient ID: ZONDRA LAWLOR, female   DOB: 10-03-1950, 66 y.o.   MRN: [id number] Patient ID: JONAY HITCHCOCK, female   DOB: Nov 04, 1950, 66 y.o.   MRN: [id number] Patient ID: JANUS VLCEK, female   DOB: 25-Oct-1950, 66 y.o.   MRN: [id number] Patient ID: JACKI COUSE, female   DOB: 08-24-1950, 66 y.o.   MRN: [id number] Patient ID: MILA PAIR, female   DOB: 1950-11-27, 66 y.o.   MRN: [id number] Patient ID: TYAH ACORD, female   DOB: 05-14-1950, 66 y.o.   MRN: [id number] Patient ID: DORISANN SCHWANKE, female   DOB: [birth year], 66 y.o.   MRN: [id number] Patient ID: BRYNLIE DAZA, female   DOB: 07-19-1950, 66 y.o.   MRN: [id number] Patient ID: LIARA HOLM, female   DOB: 1950/10/18, 66 y.o.   MRN: [id number] Patient ID: CONTRINA ORONA, female   DOB: 1950-07-23, 66 y.o.   MRN: [id number] Patient ID: MONETTA LICK, female   DOB: Jan 13, 1951, 66 y.o.   MRN: [id number] Patient ID: ATLEY SCARBORO, female   DOB: 12-Nov-1950, 66 y.o.   MRN: [id number] Patient ID: RAISSA DAM, female   DOB: 06-13-1950, 66 y.o.   MRN: [id number] Patient ID: JEWELINE REIF, female   DOB: May 10, 1950, 66 y.o.   MRN: [id number] [facility] [id number] Progress Note DENAI CABA MRN: [id number] DOB: [birth year] Age: 66 y.o.  Date: 07/22/2016 Start Time: 1:18 PM End Time: 1:53 PM  Chief Complaint: Chief Complaint  Patient presents with  . Follow-up  . Medication Refill  . Schizophrenia   Subjective: "She  Can't sleep" This patient is a 66 year old married black female lives with her husband in [place]. She has not worked in years but is not on disability. She is a very poor historian   Apparently she has had mental illness for many years. At one point she was depressed but also had psychotic symptoms that sound congruent with schizophrenia. Her husband thinks she is doing pretty well on her current medications but she tends to sleep too much  during the day. We've looked at her med list and found that her trazodone might be too high. She does better if she only takes 100 mg instead of 200 mg. She also takes meclizine in the morning which may be contributing to drowsiness. Neither one of them remember why she is on lthis.  The patient denies being depressed but her energy is poor and she is drowsy. She doesn't sleep well she'll start to have visual hallucinations at night. This happens if she sleeps throughout the whole day. She denies auditory hallucinations or paranoia.  The patient returns after 6 weeks with her husband. She looks thin and has lost another 10 pounds. In total she has lost 40 pounds in about a year. She sees Dr. Berdine Addison for her primary care but hasn't been there in a while. She saw her neurologist last week and they recommended a sleep study because her sleep is interrupted and she snores a lot. The husband states that he "wants to think about it." I explained that if she's having obstructive sleep apnea could make her mood and depression worse as well as her heart condition diabetes etc. He states that he will probably go through with that. At times she can't sleep because her thoughts race and she mumbles to the whole night. She has her head down today and makes very little effort to form a connection. She answers in monosyllables  Past psychiatric history Patient has been seeing in this office since 1998.  She was admitted at [facility] due to severe depression and psychotic features.  At that time she was having suicidal thoughts .  Since then she has been stable on current psychiatric medication. Allergies: Allergies  Allergen Reactions  . Sulfa Antibiotics Anaphylaxis  . Ibuprofen Other (See Comments)    unknown  . Shellfish Allergy Swelling    Mouth  Medical History: Past Medical History:  Diagnosis Date  . Blindness   . Diabetes mellitus type I ([place])   . GERD (gastroesophageal reflux disease)    . Hyperlipidemia   . Paroxysmal atrial fibrillation (HCC)   . Schizophrenia ([place])   . Systemic hypertension   . Vertigo   Patient has history of vertigo, GERD, hyperlipidemia, diabetes mellitus.  Patient is also legally blind.  She see Dr. Karren Burly for her annual checkup and will follow up for elevated Hemo A1c with him. Surgical History: Past Surgical History:  Procedure Laterality Date  . BRAIN TUMOR EXCISION    . NM MYOCAR PERF WALL MOTION  02/01/2007   no significant ischemia  . US ECHOCARDIOGRAPHY  12/20/2003   mild mitral annular ca+,mild MR,TR,PI,AOV mildly sclerotic  Reviewed during this appointment. Vitals: BP 138/84 (BP Location: Left Arm, Patient Position: Sitting, Cuff Size: Normal)   Pulse 99   Wt 145 lb 6.4 oz (66 kg)   SpO2 93%   BMI 22.11 kg/m  Wt down almost 3 pounds.  Mental status examination Patient is casually dressed and fairly groomed. Patient is legally blind and is  staring off into space. She did not answer any questions but continued to stare was difficult to assess her mood given that she is so unresponsive She described her mood as ok.  . She denies any auditory or visual hallucination husband reports that she gets these when she is tired  She has poverty of thought content.  She' is drowsy and not very alert and only oriented to person. There no tremors shakes or extrapyramidal side effects noted.  Lab Results:  Results for orders placed or performed during the hospital encounter of 09/17/15 (from the past 8736 hour(s))  Comprehensive metabolic panel   Collection Time: 09/17/15 12:31 PM  Result Value Ref Range   Sodium 137 135 - 145 mmol/L   Potassium 3.9 3.5 - 5.1 mmol/L   Chloride 105 101 - 111 mmol/L   CO2 25 22 - 32 mmol/L   Glucose, Bld 266 (H) 65 - 99 mg/dL   BUN 6 6 - 20 mg/dL   Creatinine, Ser 0.89 0.44 - 1.00 mg/dL   Calcium 10.2 8.9 - 10.3 mg/dL   Total Protein 6.7 6.5 - 8.1 g/dL   Albumin 3.8 3.5 - 5.0 g/dL   AST 19 15 - 41 U/L   ALT  15 14 - 54 U/L   Alkaline Phosphatase 56 38 - 126 U/L   Total Bilirubin 0.5 0.3 - 1.2 mg/dL   GFR calc non Af Amer >60 >60 mL/min   GFR calc Af Amer >60 >60 mL/min   Anion gap 7 5 - 15  CBC with Differential   Collection Time: 09/17/15 12:31 PM  Result Value Ref Range   WBC 10.2 4.0 - 10.5 K/uL   RBC 4.60 3.87 - 5.11 MIL/uL   Hemoglobin 13.9 12.0 - 15.0 g/dL   HCT 42.0 36.0 - 46.0 %   MCV 91.3 78.0 - 100.0 fL   MCH 30.2 26.0 -  34.0 pg   MCHC 33.1 30.0 - 36.0 g/dL   RDW 12.8 11.5 - 15.5 %   Platelets 255 150 - 400 K/uL   Neutrophils Relative % 72 %   Neutro Abs 7.3 1.7 - 7.7 K/uL   Lymphocytes Relative 18 %   Lymphs Abs 1.9 0.7 - 4.0 K/uL   Monocytes Relative 8 %   Monocytes Absolute 0.8 0.1 - 1.0 K/uL   Eosinophils Relative 1 %   Eosinophils Absolute 0.1 0.0 - 0.7 K/uL   Basophils Relative 1 %   Basophils Absolute 0.1 0.0 - 0.1 K/uL  I-stat troponin, ED   Collection Time: 09/17/15  3:51 PM  Result Value Ref Range   Troponin i, poc 0.00 0.00 - 0.08 ng/mL   Comment 3          Protime-INR   Collection Time: 09/17/15  4:04 PM  Result Value Ref Range   Prothrombin Time 13.3 11.4 - 15.2 seconds   INR 1.01   Digoxin level   Collection Time: 09/17/15  4:04 PM  Result Value Ref Range   Digoxin Level 0.6 (L) 0.8 - 2.0 ng/mL  Urinalysis, Routine w reflex microscopic (not at [facility])   Collection Time: 09/17/15  4:58 PM  Result Value Ref Range   Color, Urine AMBER (A) YELLOW   APPearance CLEAR CLEAR   Specific Gravity, Urine 1.029 1.005 - 1.030   pH 5.0 5.0 - 8.0   Glucose, UA >1000 (A) NEGATIVE mg/dL   Hgb urine dipstick TRACE (A) NEGATIVE   Bilirubin Urine NEGATIVE NEGATIVE   Ketones, ur 15 (A) NEGATIVE mg/dL   Protein, ur 30 (A) NEGATIVE mg/dL   Nitrite NEGATIVE NEGATIVE   Leukocytes, UA NEGATIVE NEGATIVE  Urine microscopic-add on   Collection Time: 09/17/15  4:58 PM  Result Value Ref Range   Squamous Epithelial / LPF 6-30 (A) NONE SEEN   WBC, UA 0-5 0 - 5 WBC/hpf    RBC / HPF 0-5 0 - 5 RBC/hpf   Bacteria, UA FEW (A) NONE SEEN   Urine-Other MUCOUS PRESENT   MRSA PCR Screening   Collection Time: 09/17/15 10:20 PM  Result Value Ref Range   MRSA by PCR NEGATIVE NEGATIVE  Glucose, capillary   Collection Time: 09/17/15 10:26 PM  Result Value Ref Range   Glucose-Capillary 125 (H) 65 - 99 mg/dL  Glucose, capillary   Collection Time: 09/18/15  8:19 AM  Result Value Ref Range   Glucose-Capillary 157 (H) 65 - 99 mg/dL   Comment 1 Notify RN    Comment 2 Document in Chart   Glucose, capillary   Collection Time: 09/18/15 12:20 PM  Result Value Ref Range   Glucose-Capillary 230 (H) 65 - 99 mg/dL   Comment 1 Notify RN    Comment 2 Document in Chart   Glucose, capillary   Collection Time: 09/18/15  4:41 PM  Result Value Ref Range   Glucose-Capillary 252 (H) 65 - 99 mg/dL   Comment 1 Notify RN    Comment 2 Document in Chart   Glucose, capillary   Collection Time: 09/18/15  9:02 PM  Result Value Ref Range   Glucose-Capillary 220 (H) 65 - 99 mg/dL   Comment 1 Notify RN    Comment 2 Document in Chart   Glucose, capillary   Collection Time: 09/19/15  6:47 AM  Result Value Ref Range   Glucose-Capillary 157 (H) 65 - 99 mg/dL   Comment 1 Notify RN    Comment 2 Document in  Chart   Hemoglobin A1c   Collection Time: 09/19/15  7:37 AM  Result Value Ref Range   Hgb A1c MFr Bld 7.9 (H) 4.8 - 5.6 %   Mean Plasma Glucose 180 mg/dL  Glucose, capillary   Collection Time: 09/19/15  9:19 AM  Result Value Ref Range   Glucose-Capillary 131 (H) 65 - 99 mg/dL  Glucose, capillary   Collection Time: 09/19/15 11:09 AM  Result Value Ref Range   Glucose-Capillary 121 (H) 65 - 99 mg/dL   Comment 1 Notify RN    Comment 2 Document in Chart   Glucose, capillary   Collection Time: 09/19/15  4:25 PM  Result Value Ref Range   Glucose-Capillary 273 (H) 65 - 99 mg/dL  Glucose, capillary   Collection Time: 09/19/15  9:12 PM  Result Value Ref Range   Glucose-Capillary 277  (H) 65 - 99 mg/dL   Comment 1 Notify RN    Comment 2 Document in Chart   Glucose, capillary   Collection Time: 09/20/15  6:21 AM  Result Value Ref Range   Glucose-Capillary 170 (H) 65 - 99 mg/dL   Comment 1 Notify RN    Comment 2 Document in Chart   Glucose, capillary   Collection Time: 09/20/15 11:11 AM  Result Value Ref Range   Glucose-Capillary 154 (H) 65 - 99 mg/dL   Comment 1 Notify RN    Comment 2 Document in Chart   Glucose, capillary   Collection Time: 09/20/15  4:16 PM  Result Value Ref Range   Glucose-Capillary 268 (H) 65 - 99 mg/dL  Protein electrophoresis, serum   Collection Time: 09/20/15  4:37 PM  Result Value Ref Range   Total Protein ELP 6.3 6.0 - 8.5 g/dL   Albumin ELP 3.4 2.9 - 4.4 g/dL   Alpha-1-Globulin 0.2 0.0 - 0.4 g/dL   Alpha-2-Globulin 0.7 0.4 - 1.0 g/dL   Beta Globulin 0.9 0.7 - 1.3 g/dL   Gamma Globulin 1.1 0.4 - 1.8 g/dL   M-Spike, % Not Observed Not Observed g/dL   SPE Interp. Comment    Comment Comment    GLOBULIN, TOTAL 2.9 2.2 - 3.9 g/dL   A/G Ratio 1.2 0.7 - 1.7  Glucose, capillary   Collection Time: 09/20/15  9:00 PM  Result Value Ref Range   Glucose-Capillary 335 (H) 65 - 99 mg/dL   Comment 1 Notify RN    Comment 2 Document in Chart   Glucose, capillary   Collection Time: 09/21/15  6:17 AM  Result Value Ref Range   Glucose-Capillary 222 (H) 65 - 99 mg/dL   Comment 1 Notify RN    Comment 2 Document in Chart   Glucose, capillary   Collection Time: 09/21/15 11:24 AM  Result Value Ref Range   Glucose-Capillary 183 (H) 65 - 99 mg/dL   Comment 1 Notify RN    Comment 2 Document in Chart   Glucose, capillary   Collection Time: 09/21/15  5:04 PM  Result Value Ref Range   Glucose-Capillary 230 (H) 65 - 99 mg/dL   Comment 1 Notify RN    Comment 2 Document in Chart   Glucose, capillary   Collection Time: 09/21/15  9:40 PM  Result Value Ref Range   Glucose-Capillary 295 (H) 65 - 99 mg/dL  Glucose, capillary   Collection Time:  09/22/15  6:03 AM  Result Value Ref Range   Glucose-Capillary 256 (H) 65 - 99 mg/dL   Comment 1 Notify RN    Comment 2 Document  in Chart   Glucose, capillary   Collection Time: 09/22/15 11:48 AM  Result Value Ref Range   Glucose-Capillary 192 (H) 65 - 99 mg/dL  Glucose, capillary   Collection Time: 09/22/15  4:55 PM  Result Value Ref Range   Glucose-Capillary 293 (H) 65 - 99 mg/dL  Glucose, capillary   Collection Time: 09/22/15  9:59 PM  Result Value Ref Range   Glucose-Capillary 250 (H) 65 - 99 mg/dL   Comment 1 Notify RN    Comment 2 Document in Chart   Glucose, capillary   Collection Time: 09/23/15  6:24 AM  Result Value Ref Range   Glucose-Capillary 173 (H) 65 - 99 mg/dL   Comment 1 Notify RN    Comment 2 Document in Chart   Glucose, capillary   Collection Time: 09/23/15 11:45 AM  Result Value Ref Range   Glucose-Capillary 205 (H) 65 - 99 mg/dL  Glucose, capillary   Collection Time: 09/23/15  4:39 PM  Result Value Ref Range   Glucose-Capillary 249 (H) 65 - 99 mg/dL   Comment 1 QC Due   Glucose, capillary   Collection Time: 09/23/15 10:24 PM  Result Value Ref Range   Glucose-Capillary 112 (H) 65 - 99 mg/dL   Comment 1 Notify RN    Comment 2 Document in Chart   Glucose, capillary   Collection Time: 09/24/15  6:26 AM  Result Value Ref Range   Glucose-Capillary 200 (H) 65 - 99 mg/dL  Glucose, capillary   Collection Time: 09/24/15 10:58 AM  Result Value Ref Range   Glucose-Capillary 289 (H) 65 - 99 mg/dL   Comment 1 Notify RN    Comment 2 Document in Chart   Glucose, capillary   Collection Time: 09/24/15  4:14 PM  Result Value Ref Range   Glucose-Capillary 262 (H) 65 - 99 mg/dL   Comment 1 Notify RN    Comment 2 Document in Chart   Glucose, capillary   Collection Time: 09/24/15 10:23 PM  Result Value Ref Range   Glucose-Capillary 232 (H) 65 - 99 mg/dL   Comment 1 Notify RN    Comment 2 Document in Chart   CBC   Collection Time: 09/25/15  4:29 AM  Result  Value Ref Range   WBC 10.0 4.0 - 10.5 K/uL   RBC 4.26 3.87 - 5.11 MIL/uL   Hemoglobin 12.6 12.0 - 15.0 g/dL   HCT 38.9 36.0 - 46.0 %   MCV 91.3 78.0 - 100.0 fL   MCH 29.6 26.0 - 34.0 pg   MCHC 32.4 30.0 - 36.0 g/dL   RDW 12.4 11.5 - 15.5 %   Platelets 166 150 - 400 K/uL  Basic metabolic panel   Collection Time: 09/25/15  4:29 AM  Result Value Ref Range   Sodium 137 135 - 145 mmol/L   Potassium 3.9 3.5 - 5.1 mmol/L   Chloride 104 101 - 111 mmol/L   CO2 26 22 - 32 mmol/L   Glucose, Bld 179 (H) 65 - 99 mg/dL   BUN 13 6 - 20 mg/dL   Creatinine, Ser 0.70 0.44 - 1.00 mg/dL   Calcium 10.3 8.9 - 10.3 mg/dL   GFR calc non Af Amer >60 >60 mL/min   GFR calc Af Amer >60 >60 mL/min   Anion gap 7 5 - 15  Glucose, capillary   Collection Time: 09/25/15  6:31 AM  Result Value Ref Range   Glucose-Capillary 147 (H) 65 - 99 mg/dL   Comment 1 Notify RN  Comment 2 Document in Chart   Glucose, capillary   Collection Time: 09/25/15 11:01 AM  Result Value Ref Range   Glucose-Capillary 159 (H) 65 - 99 mg/dL   Comment 1 Notify RN    Comment 2 Document in Chart   Glucose, capillary   Collection Time: 09/25/15  4:36 PM  Result Value Ref Range   Glucose-Capillary 206 (H) 65 - 99 mg/dL   Comment 1 Notify RN    Comment 2 Document in Chart   Glucose, capillary   Collection Time: 09/25/15  9:22 PM  Result Value Ref Range   Glucose-Capillary 185 (H) 65 - 99 mg/dL   Comment 1 Notify RN    Comment 2 Document in Chart   Glucose, capillary   Collection Time: 09/26/15  6:21 AM  Result Value Ref Range   Glucose-Capillary 99 65 - 99 mg/dL   Comment 1 Notify RN    Comment 2 Document in Chart   CBC   Collection Time: 09/26/15  6:28 AM  Result Value Ref Range   WBC 9.1 4.0 - 10.5 K/uL   RBC 4.47 3.87 - 5.11 MIL/uL   Hemoglobin 13.5 12.0 - 15.0 g/dL   HCT 40.8 36.0 - 46.0 %   MCV 91.3 78.0 - 100.0 fL   MCH 30.2 26.0 - 34.0 pg   MCHC 33.1 30.0 - 36.0 g/dL   RDW 12.3 11.5 - 15.5 %   Platelets 175  150 - 400 K/uL  Basic metabolic panel   Collection Time: 09/26/15  6:28 AM  Result Value Ref Range   Sodium 139 135 - 145 mmol/L   Potassium 4.2 3.5 - 5.1 mmol/L   Chloride 104 101 - 111 mmol/L   CO2 28 22 - 32 mmol/L   Glucose, Bld 101 (H) 65 - 99 mg/dL   BUN 7 6 - 20 mg/dL   Creatinine, Ser 0.61 0.44 - 1.00 mg/dL   Calcium 10.3 8.9 - 10.3 mg/dL   GFR calc non Af Amer >60 >60 mL/min   GFR calc Af Amer >60 >60 mL/min   Anion gap 7 5 - 15  Protein electrophoresis, serum   Collection Time: 09/26/15  6:28 AM  Result Value Ref Range   Total Protein ELP 6.1 6.0 - 8.5 g/dL   Albumin ELP 3.2 2.9 - 4.4 g/dL   Alpha-1-Globulin 0.2 0.0 - 0.4 g/dL   Alpha-2-Globulin 0.8 0.4 - 1.0 g/dL   Beta Globulin 0.9 0.7 - 1.3 g/dL   Gamma Globulin 1.0 0.4 - 1.8 g/dL   M-Spike, % Not Observed Not Observed g/dL   SPE Interp. Comment    Comment Comment    GLOBULIN, TOTAL 2.9 2.2 - 3.9 g/dL   A/G Ratio 1.1 0.7 - 1.7  Glucose, capillary   Collection Time: 09/26/15 11:36 AM  Result Value Ref Range   Glucose-Capillary 83 65 - 99 mg/dL   Comment 1 Notify RN    Comment 2 Document in Chart   Glucose, capillary   Collection Time: 09/26/15  4:30 PM  Result Value Ref Range   Glucose-Capillary 127 (H) 65 - 99 mg/dL   Comment 1 Notify RN    Comment 2 Document in Chart   Results for orders placed or performed during the hospital encounter of 08/15/15 (from the past 8736 hour(s))  Urinalysis, Routine w reflex microscopic (not at [facility])   Collection Time: 08/15/15  1:27 PM  Result Value Ref Range   Color, Urine YELLOW YELLOW   APPearance CLEAR CLEAR  Specific Gravity, Urine 1.010 1.005 - 1.030   pH 7.5 5.0 - 8.0   Glucose, UA NEGATIVE NEGATIVE mg/dL   Hgb urine dipstick SMALL (A) NEGATIVE   Bilirubin Urine NEGATIVE NEGATIVE   Ketones, ur NEGATIVE NEGATIVE mg/dL   Protein, ur NEGATIVE NEGATIVE mg/dL   Nitrite NEGATIVE NEGATIVE   Leukocytes, UA TRACE (A) NEGATIVE  Urine rapid drug screen (hosp  performed)   Collection Time: 08/15/15  1:27 PM  Result Value Ref Range   Opiates NONE DETECTED NONE DETECTED   Cocaine NONE DETECTED NONE DETECTED   Benzodiazepines POSITIVE (A) NONE DETECTED   Amphetamines NONE DETECTED NONE DETECTED   Tetrahydrocannabinol NONE DETECTED NONE DETECTED   Barbiturates NONE DETECTED NONE DETECTED  Urine microscopic-add on   Collection Time: 08/15/15  1:27 PM  Result Value Ref Range   Squamous Epithelial / LPF 0-5 (A) NONE SEEN   WBC, UA 0-5 0 - 5 WBC/hpf   RBC / HPF 0-5 0 - 5 RBC/hpf   Bacteria, UA RARE (A) NONE SEEN  Comprehensive metabolic panel   Collection Time: 08/15/15  2:01 PM  Result Value Ref Range   Sodium 136 135 - 145 mmol/L   Potassium 3.5 3.5 - 5.1 mmol/L   Chloride 106 101 - 111 mmol/L   CO2 24 22 - 32 mmol/L   Glucose, Bld 145 (H) 65 - 99 mg/dL   BUN 6 6 - 20 mg/dL   Creatinine, Ser 0.72 0.44 - 1.00 mg/dL   Calcium 9.6 8.9 - 10.3 mg/dL   Total Protein 7.1 6.5 - 8.1 g/dL   Albumin 3.7 3.5 - 5.0 g/dL   AST 15 15 - 41 U/L   ALT 13 (L) 14 - 54 U/L   Alkaline Phosphatase 58 38 - 126 U/L   Total Bilirubin 0.5 0.3 - 1.2 mg/dL   GFR calc non Af Amer >60 >60 mL/min   GFR calc Af Amer >60 >60 mL/min   Anion gap 6 5 - 15  CBC   Collection Time: 08/15/15  2:01 PM  Result Value Ref Range   WBC 7.7 4.0 - 10.5 K/uL   RBC 4.29 3.87 - 5.11 MIL/uL   Hemoglobin 12.8 12.0 - 15.0 g/dL   HCT 38.3 36.0 - 46.0 %   MCV 89.3 78.0 - 100.0 fL   MCH 29.8 26.0 - 34.0 pg   MCHC 33.4 30.0 - 36.0 g/dL   RDW 12.3 11.5 - 15.5 %   Platelets 233 150 - 400 K/uL  Digoxin level   Collection Time: 08/15/15  2:01 PM  Result Value Ref Range   Digoxin Level 0.4 (L) 0.8 - 2.0 ng/mL  Ethanol   Collection Time: 08/15/15  2:27 PM  Result Value Ref Range   Alcohol, Ethyl (B) <5 <5 mg/dL  CBG monitoring, ED   Collection Time: 08/15/15 11:02 PM  Result Value Ref Range   Glucose-Capillary 226 (H) 65 - 99 mg/dL   Comment 1 Notify RN    Comment 2 Document in  Chart   CBG monitoring, ED   Collection Time: 08/16/15  6:02 AM  Result Value Ref Range   Glucose-Capillary 161 (H) 65 - 99 mg/dL   Comment 1 Notify RN    Comment 2 Document in Chart   CBG monitoring, ED   Collection Time: 08/16/15  7:18 AM  Result Value Ref Range   Glucose-Capillary 152 (H) 65 - 99 mg/dL  CBG monitoring, ED   Collection Time: 08/16/15  9:48 PM  Result Value  Ref Range   Glucose-Capillary 350 (H) 65 - 99 mg/dL  CBG monitoring, ED   Collection Time: 08/16/15 11:42 PM  Result Value Ref Range   Glucose-Capillary 270 (H) 65 - 99 mg/dL  CBG monitoring, ED   Collection Time: 08/17/15  7:55 AM  Result Value Ref Range   Glucose-Capillary 139 (H) 65 - 99 mg/dL  CBG monitoring, ED   Collection Time: 08/17/15  9:58 PM  Result Value Ref Range   Glucose-Capillary 320 (H) 65 - 99 mg/dL  CBG monitoring, ED   Collection Time: 08/18/15  9:32 AM  Result Value Ref Range   Glucose-Capillary 109 (H) 65 - 99 mg/dL  CBG monitoring, ED   Collection Time: 08/18/15  6:22 PM  Result Value Ref Range   Glucose-Capillary 206 (H) 65 - 99 mg/dL   Comment 1 Document in Chart    Assessment Axis Ischizophrenia Axis II deferred Axis III see medical history Axis IV mild to moderate  Axis V 60 Plan: I took her vitals.  I reviewed CC, tobacco/med/surg Hx, meds effects/ side effects, problem list, therapies and responses as well as current situation/symptoms discussed options. She'll continue Paxil at 40 mg each bedtime for depressionAnd Risperdal 2 mg at bedtime for schizophrenia. I've told the husband to only use Xanax and trazodone if absolutely necessary. Strongly encouraged them to consider the sleep study seriously. She also needs to see her primary doctor to try to figure out what's going on with the weight loss.. After all this is accomplished they will need to come back to see me in 3 months.Her husband will call me if her situation worsens See orders and pt instructions for more  details.  MEDICATIONS this encounter: Meds ordered this encounter  Medications  . traZODone (DESYREL) 50 MG tablet    Sig: Take 1 tablet (50 mg total) by mouth at bedtime.    Dispense:  90 tablet    Refill:  2  . risperiDONE (RISPERDAL) 2 MG tablet    Sig: Take 1 tablet (2 mg total) by mouth at bedtime.    Dispense:  90 tablet    Refill:  2  . PARoxetine (PAXIL) 40 MG tablet    Sig: Take 1 tablet (40 mg total) by mouth daily.    Dispense:  90 tablet    Refill:  2  . ALPRAZolam (XANAX) 0.25 MG tablet    Sig: Take 1 tablet (0.25 mg total) by mouth 3 (three) times daily as needed for anxiety.    Dispense:  90 tablet    Refill:  2  . temazepam (RESTORIL) 15 MG capsule    Sig: Take 1 capsule (15 mg total) by mouth at bedtime as needed for sleep.    Dispense:  30 capsule    Refill:  2    Medical Decision Making Problem Points:  Established problem, stable/improving (1), Established problem, worsening (2), Review of last therapy session (1) and Review of psycho-social stressors (1) Data Points:  Review or order medicine tests (1) Review of medication regiment & side effects (2) Review of new medications or change in dosage (2)  I certify that outpatient services furnished can reasonably be expected to improve the patient's condition.   Levonne Spiller, MD

## 2016-07-27 ENCOUNTER — Telehealth: Payer: Self-pay | Admitting: Nurse Practitioner

## 2016-07-27 DIAGNOSIS — R400 Somnolence: Secondary | ICD-10-CM

## 2016-07-27 NOTE — Telephone Encounter (Signed)
Pt's husband called, pt is ready to have sleep study. See last OV

## 2016-07-27 NOTE — Telephone Encounter (Signed)
Placed order

## 2016-08-31 ENCOUNTER — Encounter: Payer: Self-pay | Admitting: Neurology

## 2016-08-31 ENCOUNTER — Ambulatory Visit (INDEPENDENT_AMBULATORY_CARE_PROVIDER_SITE_OTHER): Payer: BLUE CROSS/BLUE SHIELD | Admitting: Neurology

## 2016-08-31 VITALS — BP 131/68 | HR 62

## 2016-08-31 DIAGNOSIS — R29898 Other symptoms and signs involving the musculoskeletal system: Secondary | ICD-10-CM | POA: Diagnosis not present

## 2016-08-31 DIAGNOSIS — R351 Nocturia: Secondary | ICD-10-CM | POA: Diagnosis not present

## 2016-08-31 DIAGNOSIS — I609 Nontraumatic subarachnoid hemorrhage, unspecified: Secondary | ICD-10-CM

## 2016-08-31 DIAGNOSIS — R0683 Snoring: Secondary | ICD-10-CM | POA: Diagnosis not present

## 2016-08-31 DIAGNOSIS — R400 Somnolence: Secondary | ICD-10-CM

## 2016-08-31 DIAGNOSIS — R0681 Apnea, not elsewhere classified: Secondary | ICD-10-CM | POA: Diagnosis not present

## 2016-08-31 NOTE — Patient Instructions (Signed)
Based on your symptoms and your exam I believe you are at risk for obstructive sleep apnea or OSA, and I think we should proceed with a sleep test at home (for your convenience) to determine whether you do or do not have OSA and how severe it is. If you have more than mild OSA, I want you to consider treatment with CPAP. Please remember, the risks and ramifications of moderate to severe obstructive sleep apnea or OSA are: Cardiovascular disease, including congestive heart failure, stroke, difficult to control hypertension, arrhythmias, and even type 2 diabetes has been linked to untreated OSA. Sleep apnea causes disruption of sleep and sleep deprivation in most cases, which, in turn, can cause recurrent headaches, problems with memory, mood, concentration, focus, and vigilance. Most people with untreated sleep apnea report excessive daytime sleepiness, which can affect their ability to drive. Please do not drive if you feel sleepy.   We will call you after your sleep study to advise about the results (most likely, you will hear from Cam[place], my nurse) and to set up an appointment at the time, as necessary.

## 2016-08-31 NOTE — Progress Notes (Signed)
Subjective:    Patient ID: Rachel [name] is a 66 y.o. female.  HPI     Star Age, MD, PhD [facility] [address], [address] [place], [place] [address]  Dear Hoyle Sauer and Mamie Nick,   I saw your patient, Rachel [name], upon your kind request in my clinic today for initial consultation of her sleep disorder, in particular, concern for underlying obstructive sleep apnea. The patient is accompanied by her husband today. As you know, Rachel [name] is a 66 year old right-handed woman with an underlying complex medical history of resection of occipital mass, paroxysmal A. fib, hypertension, hyperlipidemia, paranoid schizophrenia, diabetes, left frontoparietal subarachnoid hemorrhage, and gait and balance problems with recurrent falls, who is reported to snore and have excessive daytime somnolence. I reviewed your office note from 06/16/2016. For her mood disorder she is followed by behavioral health. She is not able to provide her own history. Her husband provides her history. He has noted pauses in her breathing while she is asleep. He has to help her in and out of bed. She has nocturia about twice per average night, bedtime around 9 and wake up time around 7. She does not complain about morning headaches. She does not have a family history of sleep apnea. She is sleepy during the day and has a tendency to doze off. Epworth sleepiness score per husband's report is 17 out of 24, fatigue score is 16 out of 63. She is wheelchair-bound.   Her Past Medical History Is Significant For: Past Medical History:  Diagnosis Date  . Blindness   . Diabetes mellitus type I ([place])   . GERD (gastroesophageal reflux disease)   . Hyperlipidemia   . Paroxysmal atrial fibrillation (HCC)   . Schizophrenia ([place])   . Systemic hypertension   . Vertigo     Her Past Surgical History Is Significant For: Past Surgical History:  Procedure Laterality Date  . BRAIN TUMOR  EXCISION    . NM MYOCAR PERF WALL MOTION  02/01/2007   no significant ischemia  . US ECHOCARDIOGRAPHY  12/20/2003   mild mitral annular ca+,mild MR,TR,PI,AOV mildly sclerotic    Her Family History Is Significant For: Family History  Problem Relation Age of Onset  . ADD / ADHD Neg Hx   . Alcohol abuse Neg Hx   . Drug abuse Neg Hx   . Anxiety disorder Neg Hx   . Bipolar disorder Neg Hx   . Dementia Neg Hx   . Depression Neg Hx   . OCD Neg Hx   . Paranoid behavior Neg Hx   . Schizophrenia Neg Hx   . Seizures Neg Hx   . Sexual abuse Neg Hx   . Physical abuse Neg Hx     Her Social History Is Significant For: Social History   Social History  . Marital status: Married    Spouse name: N/A  . Number of children: N/A  . Years of education: N/A   Social History Main Topics  . Smoking status: Never Smoker  . Smokeless tobacco: Never Used  . Alcohol use No  . Drug use: No  . Sexual activity: Not Asked   Other Topics Concern  . None   Social History Narrative   Living home with husband, Rachel [name].  Wheelchair bound, Husband walks her around house.  Children Grown 2.  Caffiene- rare.      Her Allergies Are:  Allergies  Allergen Reactions  . Sulfa Antibiotics Anaphylaxis  . Ibuprofen Other (See  Comments)    unknown  . Shellfish Allergy Swelling    Mouth  :   Her Current Medications Are:  Outpatient Encounter Prescriptions as of 08/31/2016  Medication Sig  . acetaminophen (TYLENOL) 500 MG tablet Take 1,000 mg by mouth every 6 (six) hours as needed for mild pain.  Marland Kitchen ALPRAZolam (XANAX) 0.25 MG tablet Take 1 tablet (0.25 mg total) by mouth 3 (three) times daily as needed for anxiety.  . digoxin (LANOXIN) 0.125 MG tablet Take 1 tablet (0.125 mg total) by mouth daily.  Marland Kitchen diltiazem (CARDIZEM CD) 180 MG 24 hr capsule Take 1 capsule (180 mg total) by mouth daily.  . famotidine (PEPCID) 20 MG tablet Take 20 mg by mouth daily.   . insulin NPH Human (HUMULIN N,NOVOLIN N) 100 UNIT/ML  injection Inject 0.18 mLs (18 Units total) into the skin 2 (two) times daily at 8 am and 10 pm.  . KLOR-CON 10 10 MEQ tablet Take 10 mEq by mouth daily.   Marland Kitchen loratadine (CLARITIN) 10 MG tablet Take 10 mg by mouth daily as needed for allergies.   . metFORMIN (GLUCOPHAGE) 500 MG tablet Take 1 tablet (500 mg total) by mouth 2 (two) times daily with a meal.  . PARoxetine (PAXIL) 40 MG tablet Take 1 tablet (40 mg total) by mouth daily.  . risperiDONE (RISPERDAL) 2 MG tablet Take 1 tablet (2 mg total) by mouth at bedtime.  . rosuvastatin (CRESTOR) 10 MG tablet Take 1 tablet (10 mg total) by mouth every evening.  . temazepam (RESTORIL) 15 MG capsule Take 1 capsule (15 mg total) by mouth at bedtime as needed for sleep.  . traZODone (DESYREL) 50 MG tablet Take 1 tablet (50 mg total) by mouth at bedtime.   Facility-Administered Encounter Medications as of 08/31/2016  Medication  . aspirin EC tablet 81 mg  :  Review of Systems:  Out of a complete 14 point review of systems, all are reviewed and negative with the exception of these symptoms as listed below: Review of Systems  Neurological:       Pt presents today to discuss her sleep. History is provided by pt's husband. Pt has never had a sleep study but does snore while she is on her back.  Epworth Sleepiness Scale 0= would never doze 1= slight chance of dozing 2= moderate chance of dozing 3= high chance of dozing  Sitting and reading: 3 Watching TV: 3 Sitting inactive in a public place (ex. Theater or meeting): 1 As a passenger in a car for an hour without a break: 3 Lying down to rest in the afternoon: 3 Sitting and talking to someone: 2 Sitting quietly after lunch (no alcohol): 2 In a car, while stopped in traffic: 0 Total: 17     Objective:  Neurological Exam  Physical Exam Physical Examination:   Vitals:   08/31/16 1536  BP: 131/68  Pulse: 62   General Examination: The patient is a very pleasant 66 y.o. female in no acute  distress.She is situated in her wheelchair, slumped over to the right but head and neck tilted to the left. She is minimally verbal to nonverbal. Speech is difficult to understand. She is appropriately groomed.   HEENT: Normocephalic, atraumatic, pupils are equal, round and reactive to light and accommodation. Funduscopic exam is not possible. Extraocular tracking is poor. She is able to follow commands but with delay. Airway examination is difficult, Mallampati is class III. She seems to have a small airway, tonsils were not visualized,  she does not participate in testing well enough. Hearing seems grossly intact. Speech is scanned and hypophonic and slightly dysarthric. Neck circumference is 13-1/2 inches. She has mild facial masking.  Chest: Clear to auscultation without wheezing, rhonchi or crackles noted.  Heart: S1+S2+0, regular and normal without murmurs, rubs or gallops noted.   Abdomen: Soft, non-tender and non-distended with normal bowel sounds appreciated on auscultation.  Extremities: There is no pitting edema in the distal lower extremities bilaterally. Pedal pulses are intact.  Skin: Warm and dry without trophic changes noted.  Musculoskeletal: exam reveals no obvious joint deformities, tenderness or joint swelling or erythema.   Neurologically:  Mental status: The patient is awake, does not appear to a full attention. Orientation is difficult to assess as she is minimally verbal. She is unable to provide her own history. Mood is constricted and affect is blunted.  Cranial nerves II - XII are as described above under HEENT exam. In addition: shoulder shrug is normal with equal shoulder height noted. Motor exam: Thin bulk, global strength of 3+ or 4 minus out of 5, minimal participation. Reflexes are 2-3+ in the upper extremities and diminished in the lower extremities. She is unable to stand or walk for me. Sensory exam: intact to light touch.   Assessment and Plan:  In summary,  Rachel [name] is a very pleasant 66 y.o.-year old female with an underlying complex medical history of resection of occipital mass, paroxysmal A. fib, hypertension, hyperlipidemia, paranoid schizophrenia, diabetes, left frontoparietal subarachnoid hemorrhage, and gait and balance problems with recurrent falls, whose history and physical exam are somewhat concerning for obstructive sleep apnea (OSA). I had a long chat with the patient and her husband about my findings and the diagnosis of OSA, its prognosis and treatment options. We talked about medical treatments, surgical interventions and non-pharmacological approaches. I explained in particular the risks and ramifications of untreated moderate to severe OSA, especially with respect to developing cardiovascular disease down the Road, including congestive heart failure, difficult to treat hypertension, cardiac arrhythmias, or stroke. Even type 2 diabetes has, in part, been linked to untreated OSA. Symptoms of untreated OSA include daytime sleepiness, memory problems, mood irritability and mood disorder such as depression and anxiety, lack of energy, as well as recurrent headaches, especially morning headaches. We talked about trying to maintain a healthy lifestyle in general, as well as the importance of weight control. I encouraged the patient to eat healthy, exercise daily and keep well hydrated, to keep a scheduled bedtime and wake time routine, to not skip any meals and eat healthy snacks in between meals. I advised the patient not to drive when feeling sleepy. I recommended the following at this time: sleep test at home as she is quite weak and requires assistance at home.   I explained the sleep test procedure to the patient and also outlined possible surgical and non-surgical treatment options of OSA, including the use of a custom-made dental device (which would require a referral to a specialist dentist or oral surgeon), upper airway surgical  options, such as pillar implants, radiofrequency surgery, tongue base surgery, and UPPP (which would involve a referral to an ENT surgeon). Rarely, jaw surgery such as mandibular advancement may be considered.  I also explained the CPAP treatment option to the patient, who indicated that she would be willing to try CPAP if the need arises. I explained the importance of being compliant with PAP treatment, not only for insurance purposes but primarily to improve Her symptoms,  and for the patient's long term health benefit, including to reduce Her cardiovascular risks. I answered all their questions today and the patient and her husband were in agreement. I will likely see her back after the sleep study is completed and encouraged them to call with any interim questions, concerns, problems or updates.   Thank you very much for allowing me to participate in the care of this nice patient. If I can be of any further assistance to you please do not hesitate to talk to me.   Sincerely,   Star Age, MD, PhD

## 2016-09-14 ENCOUNTER — Ambulatory Visit (INDEPENDENT_AMBULATORY_CARE_PROVIDER_SITE_OTHER): Payer: BLUE CROSS/BLUE SHIELD | Admitting: Neurology

## 2016-09-14 DIAGNOSIS — G4733 Obstructive sleep apnea (adult) (pediatric): Secondary | ICD-10-CM

## 2016-09-14 DIAGNOSIS — G4734 Idiopathic sleep related nonobstructive alveolar hypoventilation: Secondary | ICD-10-CM

## 2016-09-28 NOTE — Addendum Note (Signed)
Addended by: Star Age on: 09/28/2016 07:39 AM   Modules accepted: Orders

## 2016-09-28 NOTE — Procedures (Signed)
  [facility] [address] [place], [place] [address] NAME: Rachel [name] DOB: [birth year] MEDICAL RECORD [id number] DOS: 09/23/16 REFERRING PHYSICIAN: Antony Contras, MD/ Evlyn Courier, NP STUDY PERFORMED: HST HISTORY: 66 year old woman with a history of: resection of occipital mass, paroxysmal Atrial fib, hypertension, hyperlipidemia, paranoid schizophrenia, diabetes, left fronto-parietal subarachnoid hemorrhage, and gait and balance problems with recurrent falls, who is reported to snore and have excessive daytime somnolence. Her Epworth sleepiness score per husband's report is 17 out of 24, fatigue score is 16 out of 63. She is wheelchair-bound. BMI is 66.2. STUDY RESULTS: Total Recording Time: 11h 56m Total Apnea/Hypopnea Index (AHI): 55.4/ hr. Average Oxygen Saturation: 92% Lowest Oxygen Saturation: 77%, Time below 88% saturation: 90 min. Average Heart Rate: 80 bpm IMPRESSION: OSA, nocturnal hypoxemia RECOMMENDATION: This home sleep test demonstrates severe obstructive sleep apnea with a total AHI of 55.4/hour and O2 nadir of 77%. Treatment with positive airway pressure (PAP) is recommended. Given her complicated history and wheelchair bound state, a trial of AutoPAP at home will be recommended first. A full night CPAP titration study can be considered. Please note that untreated obstructive sleep apnea carries additional perioperative morbidity. Patients with significant obstructive sleep apnea should receive perioperative PAP therapy and the surgeons and particularly the anesthesiologist should be informed of the diagnosis and the severity of the sleep disordered breathing. The patient should be cautioned not to drive, work at heights, or operate dangerous or heavy equipment when tired or sleepy. Review and reiteration of good sleep hygiene measures should be pursued with any patient. The patient and her referring provider will be notified of the test  results. The patient will be seen in follow up in sleep clinic at [facility].  I certify that I have reviewed the raw data recording prior to the issuance of this report in accordance with the standards of the American Academy of Sleep Medicine (AASM).    Star Age, MD, PhD [facility]) Diplomat, ABPN (neurology and sleep)

## 2016-09-28 NOTE — Progress Notes (Signed)
Patient referred by CM and Dr. Leonie Man, seen by me on 08/31/16, HST on 09/23/16 (this was the second attempt):  Please call and notify the patient that the recent home sleep test did suggest the diagnosis of severe obstructive sleep apnea and that I recommend treatment for this in the form of autoPAP at home (patient is quite debilitated, WC bound, will need in home set up). I have placed an order in the chart. She will need FU with me on [place] in about 10 weeks post set up. Please make that appt as well.   Star Age, MD, PhD [facility])

## 2016-09-29 ENCOUNTER — Telehealth: Payer: Self-pay

## 2016-09-29 NOTE — Telephone Encounter (Signed)
Spoke with patients husband and explained the results of home sleep study. Explained that a home health company would be calling them to set patient up on an auto-cpap unit. Order will be sent and they be would  receiving a call from them. She has an office appointment schedule for 3 months out. Please send order to [facility] in [place] since this is where patient lives.

## 2016-10-08 ENCOUNTER — Other Ambulatory Visit: Payer: Self-pay

## 2016-10-08 MED ORDER — DILTIAZEM HCL ER COATED BEADS 180 MG PO CP24
180.0000 mg | ORAL_CAPSULE | Freq: Every day | ORAL | 0 refills | Status: DC
Start: 2016-10-08 — End: 2016-12-29

## 2016-10-08 MED ORDER — DIGOXIN 125 MCG PO TABS: 0.1250 mg | ORAL_TABLET | Freq: Every day | ORAL | 0 refills | Status: DC

## 2016-10-08 NOTE — Telephone Encounter (Signed)
Rx(s) sent to pharmacy electronically.  

## 2016-10-18 ENCOUNTER — Other Ambulatory Visit (HOSPITAL_COMMUNITY): Payer: Self-pay | Admitting: Psychiatry

## 2016-10-22 ENCOUNTER — Encounter (HOSPITAL_COMMUNITY): Payer: Self-pay | Admitting: Psychiatry

## 2016-10-22 ENCOUNTER — Ambulatory Visit (INDEPENDENT_AMBULATORY_CARE_PROVIDER_SITE_OTHER): Payer: BLUE CROSS/BLUE SHIELD | Admitting: Psychiatry

## 2016-10-22 VITALS — BP 136/81 | HR 77 | Ht 68.0 in | Wt 130.4 lb

## 2016-10-22 DIAGNOSIS — F200 Paranoid schizophrenia: Secondary | ICD-10-CM

## 2016-10-22 DIAGNOSIS — Z736 Limitation of activities due to disability: Secondary | ICD-10-CM | POA: Diagnosis not present

## 2016-10-22 MED ORDER — ALPRAZOLAM 0.25 MG PO TABS: 0.2500 mg | ORAL_TABLET | Freq: Three times a day (TID) | ORAL | 2 refills | Status: DC | PRN

## 2016-10-22 MED ORDER — RISPERIDONE 2 MG PO TABS: 2.0000 mg | ORAL_TABLET | Freq: Every day | ORAL | 2 refills | Status: DC

## 2016-10-22 MED ORDER — FLUOXETINE HCL 20 MG PO CAPS: 20.0000 mg | ORAL_CAPSULE | ORAL | 2 refills | Status: DC

## 2016-10-22 NOTE — Progress Notes (Signed)
Patient ID: Rachel [name], female   DOB: [birth year], 66 y.o.   MRN: [id number] Patient ID: Rachel [name], female   DOB: 1950/11/09, 66 y.o.   MRN: [id number] Patient ID: Rachel [name], female   DOB: August 26, 1950, 66 y.o.   MRN: [id number] Patient ID: Rachel [name], female   DOB: 1950/09/26, 66 y.o.   MRN: [id number] Patient ID: Rachel [name], female   DOB: [birth year], 66 y.o.   MRN: [id number] Patient ID: Rachel [name], female   DOB: Oct 29, 1950, 66 y.o.   MRN: [id number] Patient ID: Rachel [name], female   DOB: 1950-04-27, 66 y.o.   MRN: [id number] Patient ID: Rachel [name], female   DOB: 14-Apr-1950, 66 y.o.   MRN: [id number] Patient ID: Rachel [name], female   DOB: [birth year], 66 y.o.   MRN: [id number] Patient ID: Rachel [name], female   DOB: [birth year], 66 y.o.   MRN: [id number] Patient ID: Rachel [name], female   DOB: [birth year], 66 y.o.   MRN: [id number] Patient ID: Rachel [name], female   DOB: 1950-09-05, 66 y.o.   MRN: [id number] Patient ID: Rachel [name], female   DOB: 25-Jan-1951, 66 y.o.   MRN: [id number] Patient ID: Rachel [name] MARZANO, female   DOB: March 05, 1950, 66 y.o.   MRN: [id number] [facility] [id number] Progress Note Rachel [name] MRN: [id number] DOB: 1950-08-28 Age: 66 y.o.  Date: 10/22/2016 Start Time: 1:18 PM End Time: 1:53 PM  Chief Complaint: Chief Complaint  Patient presents with  . Depression  . Schizophrenia  . Follow-up   Subjective: "She barely eats" This patient is a 66 year old married black female lives with her husband in [place]. She has not worked in years but is not on disability. She is a very poor historian   Apparently she has had mental illness for many years. At one point she was depressed but also had psychotic symptoms that sound congruent with schizophrenia. Her husband thinks she is doing pretty well on her current medications but she tends to sleep too much during  the day. We've looked at her med list and found that her trazodone might be too high. She does better if she only takes 100 mg instead of 200 mg. She also takes meclizine in the morning which may be contributing to drowsiness. Neither one of them remember why she is on lthis.  The patient denies being depressed but her energy is poor and she is drowsy. She doesn't sleep well she'll start to have visual hallucinations at night. This happens if she sleeps throughout the whole day. She denies auditory hallucinations or paranoia.  The patient returns after 3 months with her husband. She has lost even more weight. She is down to 130 pounds in about a year ago she was 180 pounds. Her husband states that she falls asleep she is trying to eat and doesn't have any energy. She stares out into space and only answers to her name. He states that she talks very little. He has to dress her and feed her. She is beginning to look like a dementia patient. Her primary doctor thought she might have sleep apnea and she's had a workup for this and now is having a CPAP machine but he sees very little change. She's had brain damage over the years from her chronic neurological disorder and this may be contributing to global decrease in brain function very much like dementia.. In case she has depressed however I will change her antidepressant from  Paxil to Prozac. It's very hard to tell what is going on since she says so little.      Past psychiatric history Patient has been seeing in this office since 1998.  She was admitted at [facility] due to severe depression and psychotic features.  At that time she was having suicidal thoughts .  Since then she has been stable on current psychiatric medication. Allergies: Allergies  Allergen Reactions  . Sulfa Antibiotics Anaphylaxis  . Ibuprofen Other (See Comments)    unknown  . Shellfish Allergy Swelling    Mouth  Medical History: Past Medical History:  Diagnosis Date   . Blindness   . Diabetes mellitus type I ([place])   . GERD (gastroesophageal reflux disease)   . Hyperlipidemia   . Paroxysmal atrial fibrillation (HCC)   . Schizophrenia ([place])   . Systemic hypertension   . Vertigo   Patient has history of vertigo, GERD, hyperlipidemia, diabetes mellitus.  Patient is also legally blind.  She see Dr. Karren Burly for her annual checkup and will follow up for elevated Hemo A1c with him. Surgical History: Past Surgical History:  Procedure Laterality Date  . BRAIN TUMOR EXCISION    . NM MYOCAR PERF WALL MOTION  02/01/2007   no significant ischemia  . US ECHOCARDIOGRAPHY  12/20/2003   mild mitral annular ca+,mild MR,TR,PI,AOV mildly sclerotic  Reviewed during this appointment. Vitals: BP 136/81 (BP Location: Right Arm)   Pulse 77   Ht 5\' 8"  (1.727 m)   Wt 130 lb 6.4 oz (59.1 kg)   BMI 19.83 kg/m  Wt down almost 3 pounds.  Mental status examination Patient is casually dressed and fairly groomed. Patient is legally blind and is  staring off into space she looks very thin, almost emaciated. She did not answer any questions but continued to stare was difficult to assess her mood given that she is so unresponsive  . She denies any auditory or visual hallucination husband reports that she gets these when she is tired  She has poverty of thought content.  She' is not very alert and only oriented to person. There no tremors shakes or extrapyramidal side effects noted.  Lab Results:  No results found for this or any previous visit (from the past 8736 hour(s)). Assessment Axis Ischizophrenia Axis II deferred Axis III see medical history Axis IV mild to moderate  Axis V 60 Plan: I took her vitals.  I reviewed CC, tobacco/med/surg Hx, meds effects/ side effects, problem list, therapies and responses as well as current situation/symptoms discussed options. She'll discontinue Paxil and start Prozac 20 mg daily for depression And Risperdal 2 mg at bedtime for  schizophrenia. I've told the husband to only use Xanax and trazodone if absolutely necessary. She so drowsy that she doesn't really need trazodone and Restoril. I will try to contact her primary doctor, Iona Beard to discuss what to do about her weight loss. She will return to see me in 2 months .Her husband will call me if her situation worsens See orders and pt instructions for more details.  MEDICATIONS this encounter: Meds ordered this encounter  Medications  . risperiDONE (RISPERDAL) 2 MG tablet    Sig: Take 1 tablet (2 mg total) by mouth at bedtime.    Dispense:  90 tablet    Refill:  2  . FLUoxetine (PROZAC) 20 MG capsule    Sig: Take 1 capsule (20 mg total) by mouth every morning.    Dispense:  30 capsule  Refill:  2  . ALPRAZolam (XANAX) 0.25 MG tablet    Sig: Take 1 tablet (0.25 mg total) by mouth 3 (three) times daily as needed for anxiety.    Dispense:  90 tablet    Refill:  2    Medical Decision Making Problem Points:  Established problem, stable/improving (1), Established problem, worsening (2), Review of last therapy session (1) and Review of psycho-social stressors (1) Data Points:  Review or order medicine tests (1) Review of medication regiment & side effects (2) Review of new medications or change in dosage (2)  I certify that outpatient services furnished can reasonably be expected to improve the patient's condition.   Levonne Spiller, MD

## 2016-12-16 NOTE — Progress Notes (Signed)
[facility]  PATIENT: Rachel [name] DOB: October 13, 1950   REASON FOR VISIT: Follow-up for left frontotemporal subarachnoid hemorrhage, obstructive sleep apnea HISTORY FROM: Patient and husband Johnny    HISTORY OF PRESENT ILLNESS:Rachel [name] is a 66 year old pleasant African-American lady seen today for first office follow-up visit following hospital admission for right leg weakness in August 2017. Rachel [name] an 66 y.o.femalewith a history of Rosai-Dorfman disease, resection of an occipital mass lesion, hypertension, paroxysmal atrial fibrillation, hyperlipidemia, schizophrenia and diabetes mellitus, presenting with new onset weakness involving right leg for 2 days. She has no previous history of stroke no TIA. She's been taking aspirin 81 mg per day. MRI of the brain showed findings indicative of acute to subacute left frontoparietal subarachnoid hemorrhage, and addition to enhancing mass lesions in the left middle cranial fossa and left lateral temporal region. Plaque-like enhancement was noted involving anterior middle cranial fossa on the right, as well as an enhancing lesion involving right optic nerve. Postsurgical changes in the occipital region noted. Patient has had no change in speech and no symptoms involving the right upper extremity. She has chronic right facial weakness which is unchanged.MRI showedL frontoparietal medial SAH. Her home ASA discontinued. BP and glucose were controlled. PT/OT recommended CIR first and then changed to SNF. On 09/23/15 found to have right arm weakness, husband stated that she had on and off right arm weakness. EEG negative. Repeat MRI no change. Continued PT/OT. Transfer to SNF for continued rehab.She was seen on consultation by neurosurgery who felt that the patient subarachnoid hemorrhage was probably related to unrecognized, and not due to aneurysm or AVM and hence further vascular imaging was not done. Patient  remained stable and has been discharged from rehabilitation and has even finish home physical and occupation therapy. She is able to walk without assistance but feels her legs often drags on her balance is not the same. She's had a few minor falls but fortunately no major injuries. She has been advised to use a cane but she refuses to do so. Follow-up MRI scan of the brain showed stable appearance of the   subarachnoid hemorrhage as well as the brain lesions. She has a known history of long-standing Rosai Dorfman  disease and had undergone occipital craniotomy for removal of one such lesion and has had stable appearance of follow-up meningeal lesions in the left middle cranial fossa  UPDATE 05/18/2018CM Rachel [name], 66 year old female returns for follow-up with her husband. She has a history of subarachnoid hemorrhage. She is back on low-dose aspirin 0.81 daily. According to the husband her blood sugars run around 150-60. This is followed by Dr. Berdine Addison.  She also has medical history of hypertension, paroxysmal atrial fibrillation, hyperlipidemia, schizophrenia. She is seen by behavioral health. Blood pressure in the office today 125/78. She remains on Crestor hyperlipidemia. She does not ambulate in the home unless the husband is holding onto her. She refuses to use a cane or walker. Her physical therapy has concluded however she does not home exercise program.She has a known history of long-standing Rosai Dorfman  disease and had undergone occipital craniotomy for removal of one such lesion and has had stable appearance of follow-up meningeal lesions in the left middle cranial fossa she returns for reevaluation. Husband also reports that she has significant fatigue and daytime drowsiness. She snores at night. She has never had a sleep study.  UPDATE 11/8/2018CM Rachel [name], 66 year old female returns for follow-up with her husband.  She has  a history of subarachnoid hemorrhage in August 2017.  She also has  a history of diabetes and takes insulin blood sugars are controlled this is followed by Dr. Berdine Addison.  In addition she is on Crestor for hyperlipidemia and labs also followed by Dr. Berdine Addison.  Blood pressure in the office today 133/75.  She had a sleep study which confirmed obstructive sleep apnea.  She used CPAP machine for several weeks and cannot tell any difference so husband sent  the machine back.  She is unable to ambulate unassisted without holding onto her husband.  She refuses to use a cane or walker.  She no longer gets physical therapy and she does not do home exercise program.  She is basically total care for ADLs.  She continues to be followed by behavioral health for her schizophrenia.  She returns for reevaluation    REVIEW OF SYSTEMS: Full 14 system review of systems performed and notable only for those listed, all others are neg:  Constitutional: neg  Cardiovascular: neg Ear/Nose/Throat: neg  Skin: neg Eyes: neg Respiratory: neg Gastroitestinal: neg  Hematology/Lymphatic: neg  Endocrine: neg Musculoskeletal: Walking difficulty Allergy/Immunology: neg Neurological: neg Psychiatric: neg Sleep : Daytime sleepiness, snoring ALLERGIES: Allergies  Allergen Reactions  . Sulfa Antibiotics Anaphylaxis  . Ibuprofen Other (See Comments)    unknown  . Shellfish Allergy Swelling    Mouth    HOME MEDICATIONS: Outpatient Medications Prior to Visit  Medication Sig Dispense Refill  . acetaminophen (TYLENOL) 500 MG tablet Take 1,000 mg by mouth every 6 (six) hours as needed for mild pain.    Marland Kitchen ALPRAZolam (XANAX) 0.25 MG tablet Take 1 tablet (0.25 mg total) by mouth 3 (three) times daily as needed for anxiety. 90 tablet 2  . digoxin (LANOXIN) 0.125 MG tablet Take 1 tablet (0.125 mg total) by mouth daily. 90 tablet 0  . diltiazem (CARDIZEM CD) 180 MG 24 hr capsule Take 1 capsule (180 mg total) by mouth daily. 90 capsule 0  . famotidine (PEPCID) 20 MG tablet Take 20 mg by mouth daily.     Marland Kitchen  FLUoxetine (PROZAC) 20 MG capsule Take 1 capsule (20 mg total) by mouth every morning. 30 capsule 2  . insulin NPH Human (HUMULIN N,NOVOLIN N) 100 UNIT/ML injection Inject 0.18 mLs (18 Units total) into the skin 2 (two) times daily at 8 am and 10 pm. 10 mL 11  . KLOR-CON 10 10 MEQ tablet Take 10 mEq by mouth daily.     Marland Kitchen loratadine (CLARITIN) 10 MG tablet Take 10 mg by mouth daily as needed for allergies.   1  . PARoxetine (PAXIL) 40 MG tablet Take 40 mg by mouth.    . risperiDONE (RISPERDAL) 2 MG tablet Take 1 tablet (2 mg total) by mouth at bedtime. 90 tablet 2  . rosuvastatin (CRESTOR) 10 MG tablet Take 1 tablet (10 mg total) by mouth every evening. 90 tablet 3  . temazepam (RESTORIL) 15 MG capsule Take 1 capsule (15 mg total) by mouth at bedtime as needed for sleep. 30 capsule 2  . traZODone (DESYREL) 50 MG tablet Take 1 tablet (50 mg total) by mouth at bedtime. 90 tablet 2  . metFORMIN (GLUCOPHAGE) 500 MG tablet Take 1 tablet (500 mg total) by mouth 2 (two) times daily with a meal. (Patient not taking: Reported on 12/17/2016) 60 tablet 2   Facility-Administered Medications Prior to Visit  Medication Dose Route Frequency Provider Last Rate Last Dose  . aspirin EC tablet 81 mg  81 mg Oral Daily Garvin Fila, MD        PAST MEDICAL HISTORY: Past Medical History:  Diagnosis Date  . Blindness   . Diabetes mellitus type I ([place])   . GERD (gastroesophageal reflux disease)   . Hyperlipidemia   . Paroxysmal atrial fibrillation (H[facility])   . Schizophrenia ([place])   . Sleep apnea   . Subarachnoid hemorrhage ([place])   . Systemic hypertension   . Vertigo     PAST SURGICAL HISTORY: Past Surgical History:  Procedure Laterality Date  . BRAIN TUMOR EXCISION    . NM MYOCAR PERF WALL MOTION  02/01/2007   no significant ischemia  . US ECHOCARDIOGRAPHY  12/20/2003   mild mitral annular ca+,mild MR,TR,PI,AOV mildly sclerotic    FAMILY HISTORY: Family History  Problem Relation Age of Onset  . ADD  / ADHD Neg Hx   . Alcohol abuse Neg Hx   . Drug abuse Neg Hx   . Anxiety disorder Neg Hx   . Bipolar disorder Neg Hx   . Dementia Neg Hx   . Depression Neg Hx   . OCD Neg Hx   . Paranoid behavior Neg Hx   . Schizophrenia Neg Hx   . Seizures Neg Hx   . Sexual abuse Neg Hx   . Physical abuse Neg Hx     SOCIAL HISTORY: Social History   Socioeconomic History  . Marital status: Married    Spouse name: Not on file  . Number of children: Not on file  . Years of education: Not on file  . Highest education level: Not on file  Social Needs  . Financial resource strain: Not on file  . Food insecurity - worry: Not on file  . Food insecurity - inability: Not on file  . Transportation needs - medical: Not on file  . Transportation needs - non-medical: Not on file  Occupational History  . Not on file  Tobacco Use  . Smoking status: Never Smoker  . Smokeless tobacco: Never Used  Substance and Sexual Activity  . Alcohol use: No  . Drug use: No  . Sexual activity: Not on file  Other Topics Concern  . Not on file  Social History Narrative   Living home with husband, [name].  Wheelchair bound, Husband walks her around house.  Children Grown 2.  Caffiene- rare.       PHYSICAL EXAM  Vitals:   12/17/16 1338  BP: 133/75  Pulse: 83   There is no height or weight on file to calculate BMI.  Generalized: Well developed, in no acute distress  Head: normocephalic and atraumatic,. Oropharynx benign  Neck: Supple, no carotid bruits  Cardiac: Regular rate rhythm, no murmur  Musculoskeletal: No deformity   Neurological examination   Mentation: Alert oriented and oriented x3   Follows all commands speech and language fluent.   Cranial nerve II-XII: Pupils were equal round reactive to light extraocular movements were full, visual field were full on confrontational test. Facial sensation and strength were normal. hearing was intact to finger rubbing bilaterally. Uvula tongue midline.    Motor: normal bulk and tone, full strength in the BUE, BLE, Poor effort with exam Sensory: normal and symmetric to light touch, pinprick, and  Vibration, in the upper and lower extremities  Coordination: finger-nose-finger, no dysmetria Reflexes: 1+ upper lower and symmetric, plantar responses were flexor bilaterally. Gait and Station: In wheelchair not ambulated DIAGNOSTIC DATA (LABS, IMAGING, TESTING) - I reviewed patient records, labs, notes, testing  and imaging myself where available.  Lab Results  Component Value Date   WBC 9.1 09/26/2015   HGB 13.5 09/26/2015   HCT 40.8 09/26/2015   MCV 91.3 09/26/2015   PLT 175 09/26/2015      Component Value Date/Time   NA 139 09/26/2015 0628   K 4.2 09/26/2015 0628   CL 104 09/26/2015 0628   CO2 28 09/26/2015 0628   GLUCOSE 101 (H) 09/26/2015 0628   BUN 7 09/26/2015 0628   CREATININE 0.61 09/26/2015 0628   CREATININE 0.81 09/11/2013 1101   CALCIUM 10.3 09/26/2015 0628   PROT 6.7 09/17/2015 1231   ALBUMIN 3.8 09/17/2015 1231   AST 19 09/17/2015 1231   ALT 15 09/17/2015 1231   ALKPHOS 56 09/17/2015 1231   BILITOT 0.5 09/17/2015 1231   GFRNONAA >60 09/26/2015 0628   GFRAA >60 09/26/2015 0628   Lab Results  Component Value Date   CHOL 127 09/11/2013   HDL 59 09/11/2013   LDLCALC 59 09/11/2013   TRIG 45 09/11/2013   CHOLHDL 2.2 09/11/2013   Lab Results  Component Value Date   HGBA1C 7.9 (H) 09/19/2015      ASSESSMENT AND PLAN 80 year African-American lady with small left medial frontal subarachnoid hemorrhage possibly related to trauma of unclear etiology. Long-standing history of Rosai Dorfman disease which appears stable. She still has mild gait and balance difficulties but is otherwise doing well.  She was diagnosed with obstructive sleep apnea and uses a CPAP machine for several weeks but did not feel it was beneficial and has stopped using it  PLAN: Gait abnormality needs the use of  assistance of 1 for ambulation at  risk for falls due to gait disturbance Blood pressure in the office today 133/75 Remains on Crestor for hyperlipidemia followed by primary care CBGs in good control according to husband remains on insulin followed by primary care Had problems tolerating the CPAP machine no longer using Continue follow-up with cardiology and primary care Will discharge from stroke clinic I spent 20 min in total face to face time with the patient more than 50% of which was spent counseling and coordination of care, reviewing test results reviewing medications and discussing and reviewing the diagnosis of management of risk factors of stroke, importance of continued follow-up with primary care for labs etc. Dennie Bible, [facility], [facility], APRN  [facility] [address] [place], [place] [address] [phone]

## 2016-12-17 ENCOUNTER — Encounter: Payer: Self-pay | Admitting: Nurse Practitioner

## 2016-12-17 ENCOUNTER — Encounter (INDEPENDENT_AMBULATORY_CARE_PROVIDER_SITE_OTHER): Payer: Self-pay

## 2016-12-17 ENCOUNTER — Ambulatory Visit: Payer: BLUE CROSS/BLUE SHIELD | Admitting: Nurse Practitioner

## 2016-12-17 VITALS — BP 133/75 | HR 83

## 2016-12-17 DIAGNOSIS — S066X0S Traumatic subarachnoid hemorrhage without loss of consciousness, sequela: Secondary | ICD-10-CM

## 2016-12-17 DIAGNOSIS — I10 Essential (primary) hypertension: Secondary | ICD-10-CM | POA: Diagnosis not present

## 2016-12-17 DIAGNOSIS — E785 Hyperlipidemia, unspecified: Secondary | ICD-10-CM

## 2016-12-17 DIAGNOSIS — D763 Other histiocytosis syndromes: Secondary | ICD-10-CM

## 2016-12-17 NOTE — Progress Notes (Signed)
I agree with the above plan 

## 2016-12-17 NOTE — Patient Instructions (Signed)
Use the assistance of 1 for ambulation at risk for falls due to gait disturbance Blood pressure in the office today 133/75 Remains on Crestor for hyperlipidemia followed by primary care CBGs in good control according to husband remains on insulin followed by primary care Had problems tolerating the CPAP machine no longer using Continue follow-up with cardiology and primary care Will discharge from stroke clinic

## 2016-12-22 ENCOUNTER — Ambulatory Visit (HOSPITAL_COMMUNITY): Payer: BLUE CROSS/BLUE SHIELD | Admitting: Psychiatry

## 2016-12-22 ENCOUNTER — Encounter (HOSPITAL_COMMUNITY): Payer: Self-pay | Admitting: Psychiatry

## 2016-12-22 VITALS — BP 124/86 | HR 80 | Ht 68.0 in

## 2016-12-22 DIAGNOSIS — R5381 Other malaise: Secondary | ICD-10-CM

## 2016-12-22 DIAGNOSIS — F200 Paranoid schizophrenia: Secondary | ICD-10-CM

## 2016-12-22 DIAGNOSIS — Z736 Limitation of activities due to disability: Secondary | ICD-10-CM | POA: Diagnosis not present

## 2016-12-22 DIAGNOSIS — R5383 Other fatigue: Secondary | ICD-10-CM | POA: Diagnosis not present

## 2016-12-22 DIAGNOSIS — G4700 Insomnia, unspecified: Secondary | ICD-10-CM

## 2016-12-22 MED ORDER — TEMAZEPAM 15 MG PO CAPS: 15.0000 mg | ORAL_CAPSULE | Freq: Every evening | ORAL | 2 refills | Status: DC | PRN

## 2016-12-22 MED ORDER — PAROXETINE HCL 40 MG PO TABS: 40.0000 mg | ORAL_TABLET | Freq: Every day | ORAL | 2 refills | Status: DC

## 2016-12-22 MED ORDER — ALPRAZOLAM 0.25 MG PO TABS: 0.2500 mg | ORAL_TABLET | Freq: Three times a day (TID) | ORAL | 2 refills | Status: DC | PRN

## 2016-12-22 NOTE — Progress Notes (Signed)
BH MD/PA/NP OP Progress Note  12/22/2016 2:04 PM Rachel [name]  MRN:  [id number]  Chief Complaint:  Chief Complaint    Schizophrenia; Follow-up     HPI: This patient is a 66 year old married black female lives with her husband in [place]. She has not worked in years but is not on disability. She is a very poor historian   Apparently she has had mental illness for many years. At one point she was depressed but also had psychotic symptoms that sound congruent with schizophrenia. Her husband thinks she is doing pretty well on her current medications but she tends to sleep too much during the day. We've looked at her med list and found that her trazodone might be too high. She does better if she only takes 100 mg instead of 200 mg. She also takes meclizine in the morning which may be contributing to drowsiness. Neither one of them remember why she is on lthis.  The patient denies being depressed but her energy is poor and she is drowsy. She doesn't sleep well she'll start to have visual hallucinations at night. This happens if she sleeps throughout the whole day. She denies auditory hallucinations or paranoia.  The patient and husband return after 3 months.  She looks very thin and comes in in a wheelchair.  She is barely able to walk.  Husband states that she is barely eating any asked to force her to eat.  She has lost about 30 pounds over the past summer.  She cannot barely hold her head up and will make any eye contact.  She is not oriented to place or situation.  She is obviously declining.  The patient was seen by neurology and they have dismissed her from the clinic because there is nothing more they can offer.  She was tried on a CPAP machine for sleep apnea but it made no difference in her alertness and her husband brought it back.  We tried switching her to Prozac and he states that she was up all night and went back to the Paxil.  He states she has not brought up any auditory  visual hallucinations or delusions but she barely speaks to him.  She sees Dr. Iona Beard for primary care and apparently all her lab work is normal and he does not have an explanation for why she is losing weight   Visit Diagnosis:    ICD-10-CM   1. Paranoid schizophrenia, chronic condition ([place]) F20.0     Past Psychiatric History: Long-term treatment for schizophrenia  Past Medical History:  Past Medical History:  Diagnosis Date  . Blindness   . Diabetes mellitus type I ([place])   . GERD (gastroesophageal reflux disease)   . Hyperlipidemia   . Paroxysmal atrial fibrillation (H[facility])   . Schizophrenia ([place])   . Sleep apnea   . Subarachnoid hemorrhage ([place])   . Systemic hypertension   . Vertigo     Past Surgical History:  Procedure Laterality Date  . BRAIN TUMOR EXCISION    . NM MYOCAR PERF WALL MOTION  02/01/2007   no significant ischemia  . US ECHOCARDIOGRAPHY  12/20/2003   mild mitral annular ca+,mild MR,TR,PI,AOV mildly sclerotic    Family Psychiatric History: See below  Family History:  Family History  Problem Relation Age of Onset  . ADD / ADHD Neg Hx   . Alcohol abuse Neg Hx   . Drug abuse Neg Hx   . Anxiety disorder Neg Hx   . Bipolar disorder  Neg Hx   . Dementia Neg Hx   . Depression Neg Hx   . OCD Neg Hx   . Paranoid behavior Neg Hx   . Schizophrenia Neg Hx   . Seizures Neg Hx   . Sexual abuse Neg Hx   . Physical abuse Neg Hx     Social History:  Social History   Socioeconomic History  . Marital status: Married    Spouse name: None  . Number of children: None  . Years of education: None  . Highest education level: None  Social Needs  . Financial resource strain: None  . Food insecurity - worry: None  . Food insecurity - inability: None  . Transportation needs - medical: None  . Transportation needs - non-medical: None  Occupational History  . None  Tobacco Use  . Smoking status: Never Smoker  . Smokeless tobacco: Never Used  Substance and  Sexual Activity  . Alcohol use: No  . Drug use: No  . Sexual activity: None  Other Topics Concern  . None  Social History Narrative   Living home with husband, [name].  Wheelchair bound, Husband walks her around house.  Children Grown 2.  Caffiene- rare.      Allergies:  Allergies  Allergen Reactions  . Sulfa Antibiotics Anaphylaxis  . Ibuprofen Other (See Comments)    unknown  . Shellfish Allergy Swelling    Mouth    Metabolic Disorder Labs: Lab Results  Component Value Date   HGBA1C 7.9 (H) 09/19/2015   MPG 180 09/19/2015   MPG 249 (H) 04/01/2012   No results found for: PROLACTIN Lab Results  Component Value Date   CHOL 127 09/11/2013   TRIG 45 09/11/2013   HDL 59 09/11/2013   CHOLHDL 2.2 09/11/2013   VLDL 9 09/11/2013   LDLCALC 59 09/11/2013   Lab Results  Component Value Date   TSH 1.510 12/04/2013    Therapeutic Level Labs: No results found for: LITHIUM No results found for: VALPROATE No components found for:  CBMZ  Current Medications: Current Outpatient Medications  Medication Sig Dispense Refill  . acetaminophen (TYLENOL) 500 MG tablet Take 1,000 mg by mouth every 6 (six) hours as needed for mild pain.    Marland Kitchen ALPRAZolam (XANAX) 0.25 MG tablet Take 1 tablet (0.25 mg total) 3 (three) times daily as needed by mouth for anxiety. 90 tablet 2  . digoxin (LANOXIN) 0.125 MG tablet Take 1 tablet (0.125 mg total) by mouth daily. 90 tablet 0  . diltiazem (CARDIZEM CD) 180 MG 24 hr capsule Take 1 capsule (180 mg total) by mouth daily. 90 capsule 0  . famotidine (PEPCID) 20 MG tablet Take 20 mg by mouth daily.     . insulin NPH Human (HUMULIN N,NOVOLIN N) 100 UNIT/ML injection Inject 0.18 mLs (18 Units total) into the skin 2 (two) times daily at 8 am and 10 pm. 10 mL 11  . KLOR-CON 10 10 MEQ tablet Take 10 mEq by mouth daily.     Marland Kitchen loratadine (CLARITIN) 10 MG tablet Take 10 mg by mouth daily as needed for allergies.   1  . metFORMIN (GLUCOPHAGE) 500 MG tablet Take 1  tablet (500 mg total) by mouth 2 (two) times daily with a meal. 60 tablet 2  . PARoxetine (PAXIL) 40 MG tablet Take 1 tablet (40 mg total) at bedtime by mouth. 30 tablet 2  . risperiDONE (RISPERDAL) 2 MG tablet Take 1 tablet (2 mg total) by mouth at bedtime. 90 tablet  2  . rosuvastatin (CRESTOR) 10 MG tablet Take 1 tablet (10 mg total) by mouth every evening. 90 tablet 3  . temazepam (RESTORIL) 15 MG capsule Take 1 capsule (15 mg total) at bedtime as needed by mouth for sleep. 30 capsule 2  . traZODone (DESYREL) 50 MG tablet Take 1 tablet (50 mg total) by mouth at bedtime. 90 tablet 2   Current Facility-Administered Medications  Medication Dose Route Frequency Provider Last Rate Last Dose  . aspirin EC tablet 81 mg  81 mg Oral Daily Garvin Fila, MD         Musculoskeletal: Strength & Muscle Tone: decreased and atrophy Gait & Station: unable to stand Patient leans: N/A  Psychiatric Specialty Exam: Review of Systems  Constitutional: Positive for malaise/fatigue and weight loss.  Psychiatric/Behavioral: The patient has insomnia.   All other systems reviewed and are negative.   Blood pressure 124/86, pulse 80, height 5\' 8"  (1.727 m), SpO2 94 %.Body mass index is 19.83 kg/m.  General Appearance: Casual and Fairly Groomed  Eye Contact:  None  Speech:  Barely speaks  Volume:  Decreased  Mood:  unable to assess  Affect:  Blunt and Flat  Thought Process:  NA  Orientation:  Other:  Oriented only to person  Thought Content unable to assess  Suicidal Thoughts:  No  Homicidal Thoughts:  No  Memory:  Immediate;   Poor Recent;   Poor Remote;   Poor  Judgement:  Poor  Insight:  Lacking  Psychomotor Activity:  Cannot walk without assistance  Concentration:  Concentration: Poor and Attention Span: Poor  Recall:  Poor  Fund of Knowledge: Poor  Language: Poor  Akathisia:  No  Handed:  Right  AIMS (if indicated): not done  Assets:  Social Support  ADL's:  Impaired  Cognition:  Impaired,  Moderate  Sleep:  Fair   Screenings:   Assessment and Plan: This patient is a 66 year old female with a history of standing schizophrenia, Rosai-Dorfman disease and history of occipital craniotomy meningeal lesions.  She has been declining over the past year and now has to be forced to eat and cannot do any ADLs.  She looks like someone who has progressive dementia.  Her husband is doing all of her care.  I strongly suggested he talk to the primary doctor about further workup and/or need for home health assistance.  For the present time she is not psychotic but is very lethargic.  I urged him to cut down the Xanax if possible.  Now she will continue trazodone 50 mg at bedtime, Risperdal 2 mg at bedtime Restoril 15 mg at bedtime if needed and Xanax 0.25 mg up to 3 times a day only if needed.  She will return to see me in 4 months   Levonne Spiller, MD 12/22/2016, 2:04 PM

## 2016-12-24 ENCOUNTER — Ambulatory Visit: Payer: BLUE CROSS/BLUE SHIELD | Admitting: Cardiovascular Disease

## 2016-12-24 ENCOUNTER — Encounter: Payer: Self-pay | Admitting: Cardiovascular Disease

## 2016-12-24 VITALS — BP 140/79 | HR 83

## 2016-12-24 DIAGNOSIS — E119 Type 2 diabetes mellitus without complications: Secondary | ICD-10-CM | POA: Diagnosis not present

## 2016-12-24 DIAGNOSIS — I480 Paroxysmal atrial fibrillation: Secondary | ICD-10-CM

## 2016-12-24 DIAGNOSIS — R9431 Abnormal electrocardiogram [ECG] [EKG]: Secondary | ICD-10-CM

## 2016-12-24 DIAGNOSIS — I10 Essential (primary) hypertension: Secondary | ICD-10-CM | POA: Diagnosis not present

## 2016-12-24 DIAGNOSIS — E785 Hyperlipidemia, unspecified: Secondary | ICD-10-CM

## 2016-12-24 NOTE — Progress Notes (Signed)
Cardiology Office Note    Date:  12/26/2016   ID:  DI JASMER, DOB Feb 16, 1950, MRN [id number]  PCP:  Iona Beard, MD  Cardiologist:   Sanda Klein, MD   Chief Complaint  Patient presents with  . Follow-up    12 months;    History of Present Illness:  Rachel [name] is a 66 y.o. female with a history of paroxysmal atrial fibrillation, hypertension, hyperlipidemia and diabetes mellitus type 2 on insulin, intracranial lymphoproliferative disorder and schizophrenia presents for routine follow-up. Roughly 2 months ago she was hospitalized for a left frontoparietal subarachnoid hemorrhage, presumed to be posttraumatic. She has a history of previous resection of an occipital mass and Rosai-Dorfman's disease.  She is doing well.  She is less interactive than sleeps all day.  She has seen a neurologist and psychiatrist recently.  She is taking numerous psychoactive drugs including Risperdal, trazodone paroxetine, alprazolam and temazepam.  She has not had any complaints of chest pain, dyspnea, palpitations and has not experienced true syncope.  Her intake of food has decreased and she has lost weight.  She has not had lower extremity edema.  Electrocardiogram shows sinus rhythm but there is a striking reduction in the amplitude of R waves across the lateral precordium as well as in leads I and aVL.  No ST segment changes are seen when compared to old tracings.  No recent atrial fibrillation has been documented  Past Medical History:  Diagnosis Date  . Blindness   . Diabetes mellitus type I ([facility])   . GERD (gastroesophageal reflux disease)   . Hyperlipidemia   . Paroxysmal atrial fibrillation ([facility])   . Schizophrenia ([facility])   . Sleep apnea   . Subarachnoid hemorrhage ([facility])   . Systemic hypertension   . Vertigo     Past Surgical History:  Procedure Laterality Date  . BRAIN TUMOR EXCISION    . NM MYOCAR PERF WALL MOTION  02/01/2007   no significant ischemia  . US  ECHOCARDIOGRAPHY  12/20/2003   mild mitral annular ca+,mild MR,TR,PI,AOV mildly sclerotic    Current Medications: Outpatient Medications Prior to Visit  Medication Sig Dispense Refill  . acetaminophen (TYLENOL) 500 MG tablet Take 1,000 mg by mouth every 6 (six) hours as needed for mild pain.    Marland Kitchen ALPRAZolam (XANAX) 0.25 MG tablet Take 1 tablet (0.25 mg total) 3 (three) times daily as needed by mouth for anxiety. 90 tablet 2  . diltiazem (CARDIZEM CD) 180 MG 24 hr capsule Take 1 capsule (180 mg total) by mouth daily. 90 capsule 0  . famotidine (PEPCID) 20 MG tablet Take 20 mg by mouth daily.     . insulin NPH Human (HUMULIN N,NOVOLIN N) 100 UNIT/ML injection Inject 0.18 mLs (18 Units total) into the skin 2 (two) times daily at 8 am and 10 pm. 10 mL 11  . loratadine (CLARITIN) 10 MG tablet Take 10 mg by mouth daily as needed for allergies.   1  . PARoxetine (PAXIL) 40 MG tablet Take 1 tablet (40 mg total) at bedtime by mouth. 30 tablet 2  . risperiDONE (RISPERDAL) 2 MG tablet Take 1 tablet (2 mg total) by mouth at bedtime. 90 tablet 2  . rosuvastatin (CRESTOR) 10 MG tablet Take 1 tablet (10 mg total) by mouth every evening. 90 tablet 3  . temazepam (RESTORIL) 15 MG capsule Take 1 capsule (15 mg total) at bedtime as needed by mouth for sleep. 30 capsule 2  . traZODone (DESYREL) 50 MG  tablet Take 1 tablet (50 mg total) by mouth at bedtime. 90 tablet 2  . digoxin (LANOXIN) 0.125 MG tablet Take 1 tablet (0.125 mg total) by mouth daily. 90 tablet 0  . KLOR-CON 10 10 MEQ tablet Take 10 mEq by mouth daily.     . metFORMIN (GLUCOPHAGE) 500 MG tablet Take 1 tablet (500 mg total) by mouth 2 (two) times daily with a meal. 60 tablet 2   Facility-Administered Medications Prior to Visit  Medication Dose Route Frequency Provider Last Rate Last Dose  . aspirin EC tablet 81 mg  81 mg Oral Daily Garvin Fila, MD         Allergies:   Sulfa antibiotics; Ibuprofen; and Shellfish allergy   Social History    Socioeconomic History  . Marital status: Married    Spouse name: None  . Number of children: None  . Years of education: None  . Highest education level: None  Social Needs  . Financial resource strain: None  . Food insecurity - worry: None  . Food insecurity - inability: None  . Transportation needs - medical: None  . Transportation needs - non-medical: None  Occupational History  . None  Tobacco Use  . Smoking status: Never Smoker  . Smokeless tobacco: Never Used  Substance and Sexual Activity  . Alcohol use: No  . Drug use: No  . Sexual activity: None  Other Topics Concern  . None  Social History Narrative   Living home with husband, [name].  Wheelchair bound, Husband walks her around house.  Children Grown 2.  Caffiene- rare.        ROS:   Please see the history of present illness.    ROS All other systems reviewed and are negative.   PHYSICAL EXAM:   VS:  BP 140/79   Pulse 83     General: Asleep in a wheelchair.  She wakes up with intense stimulation and says a couple of words  after which she falls asleep again Head: no evidence of trauma, PERRL, EOMI, no exophtalmos or lid lag, no myxedema, no xanthelasma; normal ears, nose and oropharynx Neck: normal jugular venous pulsations and no hepatojugular reflux; brisk carotid pulses without delay and no carotid bruits Chest: clear to auscultation, no signs of consolidation by percussion or palpation, normal fremitus, symmetrical and full respiratory excursions Cardiovascular: normal position and quality of the apical impulse, regular rhythm, normal first and second heart sounds, no murmurs, rubs or gallops Abdomen: no tenderness or distention, no masses by palpation, no abnormal pulsatility or arterial bruits, normal bowel sounds, no hepatosplenomegaly Extremities: no clubbing, cyanosis or edema; 2+ radial, ulnar and brachial pulses bilaterally; 2+ right femoral, posterior tibial and dorsalis pedis pulses; 2+ left  femoral, posterior tibial and dorsalis pedis pulses; no subclavian or femoral bruits Neurological: grossly nonfocal Psych: Normal mood and affect   Wt Readings from Last 3 Encounters:  06/16/16 156 lb 9.6 oz (71 kg)  12/18/15 176 lb 12.8 oz (80.2 kg)  11/11/15 175 lb 9.6 oz (79.7 kg)      Studies/Labs Reviewed:   EKG:  EKG is not ordered today.  Sinus rhythm, left axis deviation, severely decreased R wave progression across the anterior leads striking reduction in R wave amplitude in leads 5-V6, 1 and aVL compared to previous tracing  Recent Labs: No results found for requested labs within last 8760 hours.   Lipid Panel    Component Value Date/Time   CHOL 127 09/11/2013 1101   TRIG  45 09/11/2013 1101   HDL 59 09/11/2013 1101   CHOLHDL 2.2 09/11/2013 1101   VLDL 9 09/11/2013 1101   LDLCALC 59 09/11/2013 1101    Additional studies/ records that were reviewed today include:  Records from recent hospitalization, [facility]    ASSESSMENT:    1. Paroxysmal atrial fibrillation (HCC)   2. Essential hypertension   3. Dyslipidemia   4. Diabetes mellitus type 2 in nonobese (HCC)   5. Abnormal EKG      PLAN:  In order of problems listed above:  1. Afib: Asymptomatic, not documented in many years.  Anticoagulation is not indicated especially considering her recent problems of intracranial tumor and surgery.  Stop digoxin. 2. HTN: Borderline elevated today.  No changes made to her blood pressure medications 3. HLP: On statin.  4. DM: No longer taking insulin 5. Abnormal EKG: Although she has not had clinical complaints or suggest this, her EKG could be interpreted as showing an interval extensive anterolateral infarction.  Could also be secondary to conduction abnormality, but the pattern really does not fit with a left anterior fascicular block.  We will check an echocardiogram to look for signs of regional wall motion abnormalities.   Medication  Adjustments/Labs and Tests Ordered: Current medicines are reviewed at length with the patient today.  Concerns regarding medicines are outlined above.  Medication changes, Labs and Tests ordered today are listed in the Patient Instructions below. Patient Instructions  Dr Sallyanne Kuster has recommended making the following medication changes: 1. STOP Digoxin 2. STOP Potassium  Your physician has requested that you have an echocardiogram. Echocardiography is a painless test that uses sound waves to create images of your heart. It provides your doctor with information about the size and shape of your heart and how well your heart's chambers and valves are working. This procedure takes approximately one hour. There are no restrictions for this procedure. >>This has been ordered to be completed at [facility] recommends that you schedule a follow-up appointment in 12 months. You will receive a reminder letter in the mail two months in advance. If you don't receive a letter, please call our office to schedule the follow-up appointment.  If you need a refill on your cardiac medications before your next appointment, please call your pharmacy.    Signed, Sanda Klein, MD  12/26/2016 3:45 PM    [facility] [address], [place], [place]  [address] Phone: [phone]; Fax: [phone]

## 2016-12-24 NOTE — Patient Instructions (Addendum)
Dr Sallyanne Kuster has recommended making the following medication changes: 1. STOP Digoxin 2. STOP Potassium  Your physician has requested that you have an echocardiogram. Echocardiography is a painless test that uses sound waves to create images of your heart. It provides your doctor with information about the size and shape of your heart and how well your heart's chambers and valves are working. This procedure takes approximately one hour. There are no restrictions for this procedure. >>This has been ordered to be completed at [facility] recommends that you schedule a follow-up appointment in 12 months. You will receive a reminder letter in the mail two months in advance. If you don't receive a letter, please call our office to schedule the follow-up appointment.  If you need a refill on your cardiac medications before your next appointment, please call your pharmacy.

## 2016-12-28 ENCOUNTER — Ambulatory Visit (HOSPITAL_COMMUNITY)
Admission: RE | Admit: 2016-12-28 | Discharge: 2016-12-28 | Disposition: A | Discharge status: Home / Self Care | Payer: BLUE CROSS/BLUE SHIELD | Source: Ambulatory Visit | Attending: Cardiovascular Disease | Admitting: Cardiovascular Disease

## 2016-12-28 DIAGNOSIS — I480 Paroxysmal atrial fibrillation: Secondary | ICD-10-CM | POA: Insufficient documentation

## 2016-12-28 DIAGNOSIS — E785 Hyperlipidemia, unspecified: Secondary | ICD-10-CM | POA: Diagnosis not present

## 2016-12-28 DIAGNOSIS — E119 Type 2 diabetes mellitus without complications: Secondary | ICD-10-CM | POA: Insufficient documentation

## 2016-12-28 DIAGNOSIS — R9431 Abnormal electrocardiogram [ECG] [EKG]: Secondary | ICD-10-CM | POA: Insufficient documentation

## 2016-12-28 DIAGNOSIS — D496 Neoplasm of unspecified behavior of brain: Secondary | ICD-10-CM | POA: Diagnosis not present

## 2016-12-28 DIAGNOSIS — I10 Essential (primary) hypertension: Secondary | ICD-10-CM | POA: Diagnosis not present

## 2016-12-28 LAB — ECHOCARDIOGRAM COMPLETE
CHL CUP MV DEC (S): 289
CHL CUP RV SYS PRESS: 26 mmHg
CHL CUP TV REG PEAK VELOCITY: 242 cm/s
E decel time: 289 msec
E/e' ratio: 10.32
FS: 41 % (ref 28–44)
IV/PV OW: 0.91
LA vol: 32 mL
LADIAMINDEX: 1.37 cm/m2
LASIZE: 23 mm
LAVOLA4C: 19.7 mL
LAVOLIN: 19.1 mL/m2
LDCA: 3.14 cm2
LEFT ATRIUM END SYS DIAM: 23 mm
LV PW d: 9.99 mm — AB (ref 0.6–1.1)
LVEEAVG: 10.32
LVEEMED: 10.32
LVELAT: 5.87 cm/s
LVOT diameter: 20 mm
MV pk E vel: 60.6 m/s
MVPKAVEL: 80.5 m/s
RV LATERAL S' VELOCITY: 10.3 cm/s
TAPSE: 20.7 mm
TDI e' lateral: 5.87
TDI e' medial: 6.2
TRMAXVEL: 242 cm/s

## 2016-12-28 NOTE — Progress Notes (Signed)
*  PRELIMINARY RESULTS* Echocardiogram 2D Echocardiogram has been performed by Leavy Cella, RDCS.  Samuel Germany 12/28/2016, 3:50 PM

## 2016-12-29 ENCOUNTER — Other Ambulatory Visit: Payer: Self-pay

## 2016-12-29 MED ORDER — ROSUVASTATIN CALCIUM 10 MG PO TABS: 10.0000 mg | ORAL_TABLET | Freq: Every evening | ORAL | 3 refills | Status: DC

## 2016-12-29 MED ORDER — DILTIAZEM HCL ER COATED BEADS 180 MG PO CP24: 180.0000 mg | ORAL_CAPSULE | Freq: Every day | ORAL | 3 refills | Status: DC

## 2016-12-29 NOTE — Telephone Encounter (Signed)
Rx(s) sent to pharmacy electronically.  

## 2017-03-25 ENCOUNTER — Other Ambulatory Visit (HOSPITAL_COMMUNITY): Payer: Self-pay | Admitting: Psychiatry

## 2017-04-19 ENCOUNTER — Emergency Department (HOSPITAL_COMMUNITY): Payer: BLUE CROSS/BLUE SHIELD

## 2017-04-19 ENCOUNTER — Encounter (HOSPITAL_COMMUNITY): Payer: Self-pay

## 2017-04-19 ENCOUNTER — Other Ambulatory Visit: Payer: Self-pay

## 2017-04-19 ENCOUNTER — Inpatient Hospital Stay (HOSPITAL_COMMUNITY)
Admission: EM | Admit: 2017-04-19 | Discharge: 2017-05-21 | DRG: 025 | Disposition: A | Discharge status: Home Health | Payer: BLUE CROSS/BLUE SHIELD | Attending: Internal Medicine | Admitting: Internal Medicine

## 2017-04-19 DIAGNOSIS — Z7189 Other specified counseling: Secondary | ICD-10-CM

## 2017-04-19 DIAGNOSIS — E876 Hypokalemia: Secondary | ICD-10-CM

## 2017-04-19 DIAGNOSIS — G935 Compression of brain: Secondary | ICD-10-CM | POA: Diagnosis present

## 2017-04-19 DIAGNOSIS — E119 Type 2 diabetes mellitus without complications: Secondary | ICD-10-CM

## 2017-04-19 DIAGNOSIS — G9349 Other encephalopathy: Secondary | ICD-10-CM | POA: Diagnosis present

## 2017-04-19 DIAGNOSIS — G936 Cerebral edema: Secondary | ICD-10-CM

## 2017-04-19 DIAGNOSIS — E785 Hyperlipidemia, unspecified: Secondary | ICD-10-CM | POA: Diagnosis present

## 2017-04-19 DIAGNOSIS — R1310 Dysphagia, unspecified: Secondary | ICD-10-CM

## 2017-04-19 DIAGNOSIS — Z978 Presence of other specified devices: Secondary | ICD-10-CM

## 2017-04-19 DIAGNOSIS — F329 Major depressive disorder, single episode, unspecified: Secondary | ICD-10-CM | POA: Diagnosis present

## 2017-04-19 DIAGNOSIS — R262 Difficulty in walking, not elsewhere classified: Secondary | ICD-10-CM | POA: Diagnosis present

## 2017-04-19 DIAGNOSIS — E8770 Fluid overload, unspecified: Secondary | ICD-10-CM | POA: Diagnosis present

## 2017-04-19 DIAGNOSIS — I10 Essential (primary) hypertension: Secondary | ICD-10-CM | POA: Diagnosis not present

## 2017-04-19 DIAGNOSIS — G4733 Obstructive sleep apnea (adult) (pediatric): Secondary | ICD-10-CM | POA: Diagnosis present

## 2017-04-19 DIAGNOSIS — D320 Benign neoplasm of cerebral meninges: Secondary | ICD-10-CM | POA: Diagnosis present

## 2017-04-19 DIAGNOSIS — E43 Unspecified severe protein-calorie malnutrition: Secondary | ICD-10-CM | POA: Diagnosis not present

## 2017-04-19 DIAGNOSIS — E872 Acidosis: Secondary | ICD-10-CM | POA: Diagnosis not present

## 2017-04-19 DIAGNOSIS — E860 Dehydration: Secondary | ICD-10-CM | POA: Diagnosis present

## 2017-04-19 DIAGNOSIS — Z515 Encounter for palliative care: Secondary | ICD-10-CM

## 2017-04-19 DIAGNOSIS — G9340 Encephalopathy, unspecified: Secondary | ICD-10-CM | POA: Diagnosis not present

## 2017-04-19 DIAGNOSIS — D763 Other histiocytosis syndromes: Secondary | ICD-10-CM

## 2017-04-19 DIAGNOSIS — F419 Anxiety disorder, unspecified: Secondary | ICD-10-CM | POA: Diagnosis present

## 2017-04-19 DIAGNOSIS — R4701 Aphasia: Secondary | ICD-10-CM | POA: Diagnosis present

## 2017-04-19 DIAGNOSIS — Z9911 Dependence on respirator [ventilator] status: Secondary | ICD-10-CM | POA: Diagnosis not present

## 2017-04-19 DIAGNOSIS — F209 Schizophrenia, unspecified: Secondary | ICD-10-CM | POA: Diagnosis present

## 2017-04-19 DIAGNOSIS — R0602 Shortness of breath: Secondary | ICD-10-CM

## 2017-04-19 DIAGNOSIS — R6510 Systemic inflammatory response syndrome (SIRS) of non-infectious origin without acute organ dysfunction: Secondary | ICD-10-CM | POA: Diagnosis not present

## 2017-04-19 DIAGNOSIS — J9601 Acute respiratory failure with hypoxia: Secondary | ICD-10-CM | POA: Diagnosis not present

## 2017-04-19 DIAGNOSIS — I480 Paroxysmal atrial fibrillation: Secondary | ICD-10-CM | POA: Diagnosis not present

## 2017-04-19 DIAGNOSIS — E1065 Type 1 diabetes mellitus with hyperglycemia: Secondary | ICD-10-CM | POA: Diagnosis present

## 2017-04-19 DIAGNOSIS — H5461 Unqualified visual loss, right eye, normal vision left eye: Secondary | ICD-10-CM | POA: Diagnosis present

## 2017-04-19 DIAGNOSIS — D496 Neoplasm of unspecified behavior of brain: Secondary | ICD-10-CM | POA: Diagnosis not present

## 2017-04-19 DIAGNOSIS — D649 Anemia, unspecified: Secondary | ICD-10-CM | POA: Diagnosis present

## 2017-04-19 DIAGNOSIS — R9401 Abnormal electroencephalogram [EEG]: Secondary | ICD-10-CM | POA: Diagnosis present

## 2017-04-19 DIAGNOSIS — L8990 Pressure ulcer of unspecified site, unspecified stage: Secondary | ICD-10-CM

## 2017-04-19 DIAGNOSIS — R4182 Altered mental status, unspecified: Secondary | ICD-10-CM

## 2017-04-19 DIAGNOSIS — R627 Adult failure to thrive: Secondary | ICD-10-CM | POA: Diagnosis present

## 2017-04-19 DIAGNOSIS — Z681 Body mass index (BMI) 19 or less, adult: Secondary | ICD-10-CM | POA: Diagnosis not present

## 2017-04-19 DIAGNOSIS — Z66 Do not resuscitate: Secondary | ICD-10-CM | POA: Diagnosis present

## 2017-04-19 DIAGNOSIS — J9690 Respiratory failure, unspecified, unspecified whether with hypoxia or hypercapnia: Secondary | ICD-10-CM

## 2017-04-19 DIAGNOSIS — R29810 Facial weakness: Secondary | ICD-10-CM | POA: Diagnosis present

## 2017-04-19 DIAGNOSIS — J9602 Acute respiratory failure with hypercapnia: Secondary | ICD-10-CM | POA: Diagnosis not present

## 2017-04-19 DIAGNOSIS — G8191 Hemiplegia, unspecified affecting right dominant side: Secondary | ICD-10-CM | POA: Diagnosis present

## 2017-04-19 DIAGNOSIS — D72829 Elevated white blood cell count, unspecified: Secondary | ICD-10-CM | POA: Diagnosis present

## 2017-04-19 DIAGNOSIS — R0902 Hypoxemia: Secondary | ICD-10-CM

## 2017-04-19 DIAGNOSIS — J988 Other specified respiratory disorders: Secondary | ICD-10-CM | POA: Diagnosis not present

## 2017-04-19 DIAGNOSIS — G9389 Other specified disorders of brain: Secondary | ICD-10-CM | POA: Diagnosis present

## 2017-04-19 DIAGNOSIS — R40242 Glasgow coma scale score 9-12, unspecified time: Secondary | ICD-10-CM | POA: Diagnosis not present

## 2017-04-19 DIAGNOSIS — K219 Gastro-esophageal reflux disease without esophagitis: Secondary | ICD-10-CM | POA: Diagnosis present

## 2017-04-19 LAB — DIFFERENTIAL
BASOS ABS: 0 10*3/uL (ref 0.0–0.1)
BASOS PCT: 0 %
EOS ABS: 0.1 10*3/uL (ref 0.0–0.7)
Eosinophils Relative: 1 %
Lymphocytes Relative: 36 %
Lymphs Abs: 3 10*3/uL (ref 0.7–4.0)
Monocytes Absolute: 0.7 10*3/uL (ref 0.1–1.0)
Monocytes Relative: 9 %
NEUTROS ABS: 4.6 10*3/uL (ref 1.7–7.7)
NEUTROS PCT: 54 %

## 2017-04-19 LAB — I-STAT CHEM 8, ED
BUN: 5 mg/dL — AB (ref 6–20)
CHLORIDE: 102 mmol/L (ref 101–111)
Calcium, Ion: 1.52 mmol/L (ref 1.15–1.40)
Creatinine, Ser: 0.6 mg/dL (ref 0.44–1.00)
Glucose, Bld: 141 mg/dL — ABNORMAL HIGH (ref 65–99)
HEMATOCRIT: 42 % (ref 36.0–46.0)
Hemoglobin: 14.3 g/dL (ref 12.0–15.0)
POTASSIUM: 3.1 mmol/L — AB (ref 3.5–5.1)
Sodium: 145 mmol/L (ref 135–145)
TCO2: 33 mmol/L — ABNORMAL HIGH (ref 22–32)

## 2017-04-19 LAB — COMPREHENSIVE METABOLIC PANEL
ALBUMIN: 3.5 g/dL (ref 3.5–5.0)
ALT: 13 U/L — AB (ref 14–54)
AST: 19 U/L (ref 15–41)
Alkaline Phosphatase: 60 U/L (ref 38–126)
Anion gap: 9 (ref 5–15)
CHLORIDE: 106 mmol/L (ref 101–111)
CO2: 29 mmol/L (ref 22–32)
Calcium: 11.9 mg/dL — ABNORMAL HIGH (ref 8.9–10.3)
Creatinine, Ser: 0.68 mg/dL (ref 0.44–1.00)
GFR calc Af Amer: >60 mL/min (ref >60)
GFR calc non Af Amer: >60 mL/min (ref >60)
GLUCOSE: 142 mg/dL — AB (ref 65–99)
POTASSIUM: 2.9 mmol/L — AB (ref 3.5–5.1)
Sodium: 144 mmol/L (ref 135–145)
Total Bilirubin: 0.7 mg/dL (ref 0.3–1.2)
Total Protein: 6.6 g/dL (ref 6.5–8.1)

## 2017-04-19 LAB — CBC
HCT: 42.4 % (ref 36.0–46.0)
HEMOGLOBIN: 14.3 g/dL (ref 12.0–15.0)
MCH: 31.2 pg (ref 26.0–34.0)
MCHC: 33.7 g/dL (ref 30.0–36.0)
MCV: 92.6 fL (ref 78.0–100.0)
Platelets: 197 10*3/uL (ref 150–400)
RBC: 4.58 MIL/uL (ref 3.87–5.11)
RDW: 13.2 % (ref 11.5–15.5)
WBC: 8.3 10*3/uL (ref 4.0–10.5)

## 2017-04-19 LAB — URINALYSIS, ROUTINE W REFLEX MICROSCOPIC
Bilirubin Urine: NEGATIVE
GLUCOSE, UA: NEGATIVE mg/dL
HGB URINE DIPSTICK: NEGATIVE
Ketones, ur: 5 mg/dL — AB
Leukocytes, UA: NEGATIVE
Nitrite: NEGATIVE
PH: 7 (ref 5.0–8.0)
Protein, ur: NEGATIVE mg/dL
SPECIFIC GRAVITY, URINE: 1.008 (ref 1.005–1.030)

## 2017-04-19 LAB — PROTIME-INR
INR: 1.01
Prothrombin Time: 13.2 seconds (ref 11.4–15.2)

## 2017-04-19 LAB — APTT: APTT: 27 s (ref 24–36)

## 2017-04-19 LAB — I-STAT TROPONIN, ED: Troponin i, poc: 0 ng/mL (ref 0.00–0.08)

## 2017-04-19 LAB — MAGNESIUM: Magnesium: 2.1 mg/dL (ref 1.7–2.4)

## 2017-04-19 LAB — PHOSPHORUS: Phosphorus: 3 mg/dL (ref 2.5–4.6)

## 2017-04-19 MED ORDER — SODIUM CHLORIDE 0.9 % IV SOLN
INTRAVENOUS | Status: AC
  Administered 2017-04-19: 23:00:00 via INTRAVENOUS

## 2017-04-19 MED ORDER — INSULIN GLARGINE 100 UNIT/ML ~~LOC~~ SOLN
15.0000 [IU] | Freq: Every day | SUBCUTANEOUS | Status: DC
  Administered 2017-04-20 – 2017-04-25 (×7): 15 [IU] via SUBCUTANEOUS
  Filled 2017-04-19 (×8): qty 0.15

## 2017-04-19 MED ORDER — GADOBENATE DIMEGLUMINE 529 MG/ML IV SOLN
13.0000 mL | Freq: Once | INTRAVENOUS | Status: AC | PRN
  Administered 2017-04-19: 13 mL via INTRAVENOUS

## 2017-04-19 MED ORDER — DEXAMETHASONE SODIUM PHOSPHATE 10 MG/ML IJ SOLN
10.0000 mg | Freq: Three times a day (TID) | INTRAMUSCULAR | Status: DC
  Administered 2017-04-20 – 2017-04-24 (×13): 10 mg via INTRAVENOUS
  Filled 2017-04-19 (×12): qty 1

## 2017-04-19 MED ORDER — INSULIN ASPART 100 UNIT/ML ~~LOC~~ SOLN
0.0000 [IU] | SUBCUTANEOUS | Status: DC
  Administered 2017-04-20 (×4): 2 [IU] via SUBCUTANEOUS
  Administered 2017-04-20: 1 [IU] via SUBCUTANEOUS
  Administered 2017-04-20: 2 [IU] via SUBCUTANEOUS
  Administered 2017-04-21 (×2): 1 [IU] via SUBCUTANEOUS
  Administered 2017-04-23: 2 [IU] via SUBCUTANEOUS
  Administered 2017-04-24: 1 [IU] via SUBCUTANEOUS
  Administered 2017-04-24: 2 [IU] via SUBCUTANEOUS
  Administered 2017-04-24: 1 [IU] via SUBCUTANEOUS
  Administered 2017-04-24 – 2017-04-25 (×4): 2 [IU] via SUBCUTANEOUS
  Administered 2017-04-25: 3 [IU] via SUBCUTANEOUS
  Administered 2017-04-25: 2 [IU] via SUBCUTANEOUS
  Administered 2017-04-25: 5 [IU] via SUBCUTANEOUS
  Administered 2017-04-25 – 2017-04-26 (×2): 3 [IU] via SUBCUTANEOUS
  Administered 2017-04-26: 2 [IU] via SUBCUTANEOUS
  Filled 2017-04-19 (×3): qty 1

## 2017-04-19 MED ORDER — ONDANSETRON HCL 4 MG PO TABS: 4.0000 mg | ORAL_TABLET | Freq: Four times a day (QID) | ORAL | Status: DC | PRN

## 2017-04-19 MED ORDER — ONDANSETRON HCL 4 MG/2ML IJ SOLN
4.0000 mg | Freq: Four times a day (QID) | INTRAMUSCULAR | Status: DC | PRN
  Administered 2017-04-23: 4 mg via INTRAVENOUS

## 2017-04-19 MED ORDER — POTASSIUM CHLORIDE 10 MEQ/100ML IV SOLN
10.0000 meq | INTRAVENOUS | Status: AC
  Administered 2017-04-19 – 2017-04-20 (×3): 10 meq via INTRAVENOUS
  Filled 2017-04-19 (×4): qty 100

## 2017-04-19 MED ORDER — SODIUM CHLORIDE 0.9 % IV SOLN
1000.0000 mL | INTRAVENOUS | Status: DC
  Administered 2017-04-19: 1000 mL via INTRAVENOUS

## 2017-04-19 MED ORDER — ACETAMINOPHEN 650 MG RE SUPP: 650.0000 mg | Freq: Four times a day (QID) | RECTAL | Status: DC | PRN

## 2017-04-19 MED ORDER — DEXAMETHASONE SODIUM PHOSPHATE 10 MG/ML IJ SOLN
10.0000 mg | Freq: Once | INTRAMUSCULAR | Status: AC
  Administered 2017-04-19: 10 mg via INTRAVENOUS
  Filled 2017-04-19: qty 1

## 2017-04-19 MED ORDER — ACETAMINOPHEN 325 MG PO TABS: 650.0000 mg | ORAL_TABLET | Freq: Four times a day (QID) | ORAL | Status: DC | PRN

## 2017-04-19 MED ORDER — SODIUM CHLORIDE 0.9 % IV BOLUS (SEPSIS)
1000.0000 mL | Freq: Once | INTRAVENOUS | Status: AC
  Administered 2017-04-19: 1000 mL via INTRAVENOUS

## 2017-04-19 NOTE — ED Provider Notes (Addendum)
Case discussed with Dr Annette Stable of [facility] who will see in the morning. He personally reviewed the images.  Agrees with decadron. NPO after midnight.   Pt is feeling better at this time per husband. I will give an additional dose of decadron at this time.    Jola Schmidt, MD 04/19/17 2230    Jola Schmidt, MD 04/19/17 2231

## 2017-04-19 NOTE — ED Triage Notes (Addendum)
Pt presents to the ed flaccid on the right arm side and aphasic. Unknown LSN.

## 2017-04-19 NOTE — H&P (Signed)
Rachel [name]:[id number] DOB: Dec 08, 1950 DOA: 04/19/2017     PCP: Iona Beard, MD  [facility] clinic Outpatient Specialists: Cardiology Croitoru, Neurology Sethi Patient coming from:    home Lives   With family    Chief Complaint: Flaccid right arm and aphasia  HPI: Rachel [name] is a 67 y.o. female with medical history significant of   paroxysmal atrial fibrillation, hypertension, hyperlipidemia and diabetes mellitus type 2 on insulin, intracranial lymphoproliferative disorder and schizophrenia,  left frontoparietal subarachnoid hemorrhage, presumed to be posttraumatic,  resection of an occipital mass and Rosai-Dorfman's disease, blindness, OSA    Presented with 2-day history of worsening right-sided weakness patient at her baseline has significant fatigue overall and unable to ambulate actually requires full assistance with all ADLs family reports decreased appetite is been progressing over the past few days. Family states too weak to eat. At baseline unable to walk without assistance, has to be fed. Full assist with all ADL's   Patient has a long-standing history of Rosai Dorfman disease and had undergone occipital craniotomy for removal of one such lesion and has had stable appearance of follow-up meningeal lesions in the left middle cranial fossa History of possibly traumatic subarachnoid hemorrhage her aspirin was restarted in May 2018 Has history of schizophrenia followed by behavioral health patient has has prolonged history of fatigue and daytime drowsiness was diagnosed with sleep apnea started on CPAP but showed no improvement and was discontinued While in ER: Given evidence of brain edema Decadron 10 mg was ordered MRI ordered by ER provider Significant initial  Findings: Sodium 145K3.1 creatinine 0.6 hemoglobin 14.3   CT worrisome for extensive's left-sided vasogenic edema involving the external capsule extending to the temporal lobe and parietal lobe  associated mass-effect with compression of left parietal ventricle and 3 mm left to right midline shift  MRI   ER provider discussed case with:  Dr. Annette Stable Who recommends: admit to medicine We'll see patient in consult in the morning  Continue with IV Decadrone    IN ER:  Temp (24hrs), Avg:97.7 F (36.5 C), Min:97.7 F (36.5 C), Max:97.7 F (36.5 C)      on arrival  ED Triage Vitals  Enc Vitals Group     BP 04/19/17 1235 (!) 127/94     Pulse Rate 04/19/17 1235 (!) 103     Resp 04/19/17 1235 16     Temp 04/19/17 1235 97.7 F (36.5 C)     Temp src --      SpO2 04/19/17 1235 100 %     Weight 04/19/17 1236 130 lb (59 kg)     Height --      Head Circumference --      Peak Flow --      Pain Score --      Pain Loc --      Pain Edu? --      Excl. in GC? --     Latest RR 13 sats 98% HR 59 BP 135/70  Following Medications were ordered in ER: Medications  sodium chloride 0.9 % bolus 1,000 mL (0 mLs Intravenous Stopped 04/19/17 1609)    Followed by  0.9 %  sodium chloride infusion (1,000 mLs Intravenous New Bag/Given 04/19/17 1625)  dexamethasone (DECADRON) injection 10 mg (10 mg Intravenous Given 04/19/17 1537)    Hospitalist was called for admission for acute encephalopathy in the setting of evidence of brain edema with midline shift  Regarding pertinent Chronic problems: a. Fib followed by Cardiology  on diltiazem and digoxin History of diabetes on metformin and NPH  Review of Systems:    Pertinent positives include:  fatigue,   Constitutional:  No weight loss, night sweats, Fevers, chills,weight loss  HEENT:  No headaches, Difficulty swallowing,Tooth/dental problems,Sore throat,  No sneezing, itching, ear ache, nasal congestion, post nasal drip,  Cardio-vascular:  No chest pain, Orthopnea, PND, anasarca, dizziness, palpitations.no Bilateral lower extremity swelling  GI:  No heartburn, indigestion, abdominal pain, nausea, vomiting, diarrhea, change in bowel habits, loss of  appetite, melena, blood in stool, hematemesis Resp:  no shortness of breath at rest. No dyspnea on exertion, No excess mucus, no productive cough, No non-productive cough, No coughing up of blood.No change in color of mucus.No wheezing. Skin:  no rash or lesions. No jaundice GU:  no dysuria, change in color of urine, no urgency or frequency. No straining to urinate.  No flank pain.  Musculoskeletal:  No joint pain or no joint swelling. No decreased range of motion. No back pain.  Psych:  No change in mood or affect. No depression or anxiety. No memory loss.  Neuro: no localizing neurological complaints, no tingling, no weakness, no double vision, no gait abnormality, no slurred speech, no confusion  As per HPI otherwise 10 point review of systems negative.   Past Medical History: Past Medical History:  Diagnosis Date  . Blindness   . Diabetes mellitus type I ([place])   . GERD (gastroesophageal reflux disease)   . Hyperlipidemia   . Paroxysmal atrial fibrillation (HCC)   . Schizophrenia ([place])   . Sleep apnea   . Subarachnoid hemorrhage ([place])   . Systemic hypertension   . Vertigo    Past Surgical History:  Procedure Laterality Date  . BRAIN TUMOR EXCISION    . NM MYOCAR PERF WALL MOTION  02/01/2007   no significant ischemia  . US ECHOCARDIOGRAPHY  12/20/2003   mild mitral annular ca+,mild MR,TR,PI,AOV mildly sclerotic     Social History:  Ambulatory with full assist only    reports that  has never smoked. she has never used smokeless tobacco. She reports that she does not drink alcohol or use drugs.  Allergies:   Allergies  Allergen Reactions  . Sulfa Antibiotics Anaphylaxis  . Ibuprofen Other (See Comments)    unknown  . Shellfish Allergy Swelling    Mouth     Family History:   Family History  Problem Relation Age of Onset  . ADD / ADHD Neg Hx   . Alcohol abuse Neg Hx   . Drug abuse Neg Hx   . Anxiety disorder Neg Hx   . Bipolar disorder Neg Hx   .  Dementia Neg Hx   . Depression Neg Hx   . OCD Neg Hx   . Paranoid behavior Neg Hx   . Schizophrenia Neg Hx   . Seizures Neg Hx   . Sexual abuse Neg Hx   . Physical abuse Neg Hx     Medications: Prior to Admission medications   Medication Sig Start Date End Date Taking? Authorizing Provider  acetaminophen (TYLENOL) 500 MG tablet Take 1,000 mg by mouth every 6 (six) hours as needed for mild pain.   Yes [provider]  ALPRAZolam Duanne Moron) 0.25 MG tablet TAKE 1 TABLET BY MOUTH THREE TIMES DAILY AS NEEDED FOR ANXIETY 03/29/17  Yes Cloria Spring, MD  diltiazem (CARDIZEM CD) 180 MG 24 hr capsule Take 1 capsule (180 mg total) by mouth daily. 12/29/16  Yes Croitoru, Dani Gobble, MD  famotidine (PEPCID) 20 MG tablet Take 20 mg by mouth daily.  01/08/11  Yes [provider]  insulin NPH Human (HUMULIN N,NOVOLIN N) 100 UNIT/ML injection Inject 0.18 mLs (18 Units total) into the skin 2 (two) times daily at 8 am and 10 pm. 09/26/15  Yes Rosalin Hawking, MD  loratadine (CLARITIN) 10 MG tablet Take 10 mg by mouth daily as needed for allergies.  09/11/15  Yes [provider]  PARoxetine (PAXIL) 40 MG tablet Take 1 tablet (40 mg total) at bedtime by mouth. 12/22/16  Yes Cloria Spring, MD  risperiDONE (RISPERDAL) 2 MG tablet Take 1 tablet (2 mg total) by mouth at bedtime. 10/22/16  Yes Cloria Spring, MD  rosuvastatin (CRESTOR) 10 MG tablet Take 1 tablet (10 mg total) by mouth every evening. 12/29/16  Yes Croitoru, Mihai, MD  temazepam (RESTORIL) 15 MG capsule Take 1 capsule (15 mg total) at bedtime as needed by mouth for sleep. 12/22/16  Yes Cloria Spring, MD  traZODone (DESYREL) 50 MG tablet Take 1 tablet (50 mg total) by mouth at bedtime. 07/22/16  Yes Cloria Spring, MD    Physical Exam: Patient Vitals for the past 24 hrs:  BP Temp Pulse Resp SpO2 Weight  04/19/17 1800 - - 62 11 100 % -  04/19/17 1730 (!) 153/69 - 60 11 100 % -  04/19/17 1700 - - (!) 57 11 100 % -  04/19/17 1645  (!) 150/69 - (!) 58 11 100 % -  04/19/17 1630 (!) 148/69 - 67 11 100 % -  04/19/17 1615 - - 69 14 100 % -  04/19/17 1600 - - 71 12 100 % -  04/19/17 1545 (!) 151/68 - 70 13 100 % -  04/19/17 1530 140/75 - 71 10 100 % -  04/19/17 1515 (!) 153/88 - 84 13 99 % -  04/19/17 1502 - - 87 13 100 % -  04/19/17 1500 (!) 145/87 - 92 - 100 % -  04/19/17 1236 - - - - - 59 kg (130 lb)  04/19/17 1235 (!) 127/94 97.7 F (36.5 C) (!) 103 16 100 % -    1. General:  in No Acute distress * Chronically ill  -appearing 2. Psychological: Alert and  Oriented 3. Head/ENT:     Dry Mucous Membranes                          Head Non traumatic, neck supple                           Poor Dentition 4. SKIN:   decreased Skin turgor,  Skin clean Dry and intact no rash, bruising noted 5. Heart: Regular rate and rhythm no  Murmur, no Rub or gallop 6. Lungs:   no wheezes or crackles   7. Abdomen: Soft,  non-tender, Non distended  bowel sounds present 8. Lower extremities: no clubbing, cyanosis, or edema 9. Neurologically generalized fatigue but able to move all 4 extremities not fully cooperative with exam due to generalized fatigue.  10. MSK: Normal range of motion   body mass index is 19.77 kg/m.  Labs on Admission:   Labs on Admission: I have personally reviewed following labs and imaging studies  CBC: Recent Labs  Lab 04/19/17 1237 04/19/17 1257  WBC 8.3  --   NEUTROABS 4.6  --   HGB 14.3 14.3  HCT 42.4 42.0  MCV 92.6  --   PLT 197  --    Basic Metabolic Panel: Recent Labs  Lab 04/19/17 1237 04/19/17 1257  NA 144 145  K 2.9* 3.1*  CL 106 102  CO2 29  --   GLUCOSE 142* 141*  BUN <5* 5*  CREATININE 0.68 0.60  CALCIUM 11.9*  --    GFR: Estimated Creatinine Clearance: 66.2 mL/min (by C-G formula based on SCr of 0.6 mg/dL). Liver Function Tests: Recent Labs  Lab 04/19/17 1237  AST 19  ALT 13*  ALKPHOS 60  BILITOT 0.7  PROT 6.6  ALBUMIN 3.5   No results for input(s): LIPASE,  AMYLASE in the last 168 hours. No results for input(s): AMMONIA in the last 168 hours. Coagulation Profile: Recent Labs  Lab 04/19/17 1237  INR 1.01   Cardiac Enzymes: No results for input(s): CKTOTAL, CKMB, CKMBINDEX, TROPONINI in the last 168 hours. BNP (last 3 results) No results for input(s): PROBNP in the last 8760 hours. HbA1C: No results for input(s): HGBA1C in the last 72 hours. CBG: No results for input(s): GLUCAP in the last 168 hours. Lipid Profile: No results for input(s): CHOL, HDL, LDLCALC, TRIG, CHOLHDL, LDLDIRECT in the last 72 hours. Thyroid Function Tests: No results for input(s): TSH, T4TOTAL, FREET4, T3FREE, THYROIDAB in the last 72 hours. Anemia Panel: No results for input(s): VITAMINB12, FOLATE, FERRITIN, TIBC, IRON, RETICCTPCT in the last 72 hours. Urine analysis:    Component Value Date/Time   COLORURINE AMBER (A) 09/17/2015 1658   APPEARANCEUR CLEAR 09/17/2015 1658   LABSPEC 1.029 09/17/2015 1658   PHURINE 5.0 09/17/2015 1658   GLUCOSEU >1000 (A) 09/17/2015 1658   HGBUR TRACE (A) 09/17/2015 1658   BILIRUBINUR NEGATIVE 09/17/2015 1658   KETONESUR 15 (A) 09/17/2015 1658   PROTEINUR 30 (A) 09/17/2015 1658   UROBILINOGEN 0.2 12/04/2013 1518   NITRITE NEGATIVE 09/17/2015 1658   LEUKOCYTESUR NEGATIVE 09/17/2015 1658   Sepsis Labs: @LABRCNTIP (procalcitonin:4,lacticidven:4) )No results found for this or any previous visit (from the past 240 hour(s)).     UA  ordered  Lab Results  Component Value Date   HGBA1C 7.9 (H) 09/19/2015    Estimated Creatinine Clearance: 66.2 mL/min (by C-G formula based on SCr of 0.6 mg/dL).  BNP (last 3 results) No results for input(s): PROBNP in the last 8760 hours.   ECG REPORT  Independently reviewed Rate:103  Rhythm: sinus tachycardia ST&T Change: No acute ischemic changes   QTC 437  Filed Weights   04/19/17 1236  Weight: 59 kg (130 lb)     Cultures:    Component Value Date/Time   SDES URINE, CLEAN  CATCH 12/04/2013 1518   SPECREQUEST NONE 12/04/2013 1518   CULT NO GROWTH Performed at [facility] 12/04/2013 1518   REPTSTATUS 12/06/2013 FINAL 12/04/2013 1518     Radiological Exams on Admission: Ct Head Wo Contrast  Result Date: 04/19/2017 CLINICAL DATA:  67 year old female with a history of fall EXAM: CT HEAD WITHOUT CONTRAST TECHNIQUE: Contiguous axial images were obtained from the base of the skull through the vertex without intravenous contrast. COMPARISON:  MRI 09/24/2015, CT head 09/19/2015, 09/18/2015 FINDINGS: Brain: Interval development of vasogenic edema of the left hemisphere, predominantly involving the external capsule and left temporal lobe, extending along white matter tracts of the centrum semi ovale and posteriorly towards the parietal and occipital lobes. There is resultant mass effect, with distortion of the left lateral ventricle as well as left to right midline shift at the foramen of Monro approximately  3 mm. Associated indistinctness of the left-sided sulci of temporal lobe and parietal lobe Brain volume loss, similar pattern to comparison CT. Vascular: Mild atherosclerotic changes. Skull: Surgical changes of suboccipital craniotomy. No acute displaced fracture. Heterogeneous appearance of the calvarium, similar prior CT. Sinuses/Orbits: No significant paranasal sinus disease. Unremarkable appearance of the orbits. Other: None IMPRESSION: Extensive left-sided vasogenic edema involving external capsule, left-sided centrum semi ovale, extending into the temporal lobe and parietal lobe. There is associated mass effect with compression of the left lateral ventricle and 3 mm of left-to-right midline shift at the foramen of Monro. Indistinctness of the left-sided sulci may be secondary to mass effect, however, new metastatic disease or manifestation of the patient's known histiocytosis may be the underlying etiology. Further evaluation with contrast-enhanced MRI is recommended.  These results were called by telephone at the time of interpretation on 04/19/2017 at 2:12 pm to Dr. Jola Schmidt , who verbally acknowledged these results. Electronically Signed   By: Corrie Mckusick D.O.   On: 04/19/2017 14:17    Chart has been reviewed    Assessment/Plan   67 y.o. female with medical history significant of   paroxysmal atrial fibrillation, hypertension, hyperlipidemia and diabetes mellitus type 2 on insulin, intracranial lymphoproliferative disorder and schizophrenia,  left frontoparietal subarachnoid hemorrhage, presumed to be posttraumatic,  resection of an occipital mass and Rosai-Dorfman's disease, blindness, OSA Admitted for acute encephalopathy in the setting of brain edema is  Present on Admission: . Hypokalemia replace check magnesium level . Rosai-Dorfman disease ([place]) defer to neurosurgery . PAF (paroxysmal atrial fibrillation) (HCC) not a candidate for anticoagulation given need for possible neurosurgical intervention.  Currently appears to be rate controlled . HTN (hypertension) stable restart home medications when able to tolerate p.o. . Acute encephalopathy secondary to brain edema improving with steroids . Brain edema ([place]) secondary to neoplasm continue Decadron otherwise as per neurosurgery . Neoplasm of brain causing mass effect on adjacent structures [facility]) -admitted to stepdown for frequent neuro checks appreciate neurosurgery consult continue with Decadron 10 mg every 8 hours . Hypercalcemia -we will rehydrate and recheck check PTH level  Dm 2-  - Order Sensitive  SSI   -   switch to   Lantus 15 units,  -  check TSH and HgA1C  - Hold by mouth medications     Other plan as per orders.  DVT prophylaxis:  SCD   Code Status:  FULL CODE as per  family   Family Communication:   Family   at  Bedside  plan of care was discussed with  Husband,    Disposition Plan:    likely will need placement for rehabilitation                                      Would benefit from PT/OT eval prior to DC   ordered                                          Consults called: Neurosurgery  Admission status:   inpatient       Level of care     SDU      I have spent a total of 76min on this admission   Samyria Rudie 04/19/2017, 10:34 PM    [facility]  Pager [phone]  after 2 AM please page floor coverage PA If 7AM-7PM, please contact the day team taking care of the patient  [email or web address]  Password [email or web address]

## 2017-04-19 NOTE — ED Notes (Signed)
Admitting MD at bedside.

## 2017-04-19 NOTE — ED Notes (Signed)
ED Provider at bedside. 

## 2017-04-19 NOTE — ED Notes (Signed)
Patient transported to MRI 

## 2017-04-19 NOTE — ED Provider Notes (Signed)
[facility] EMERGENCY DEPARTMENT Provider Note   CSN: [id number] Arrival date & time: 04/19/17  1229     History   Chief Complaint Chief Complaint  Patient presents with  . Weakness   Level 5 caveat: Altered mental status HPI Rachel [name] is a 67 y.o. female.  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  She presents to the emergency department with her spouse reports decreased generalized weakness and decreased appetite over the past several days with significant progression today.  She has been able to speak but usually does not speak often.  She is blind in her right eye.  She has a history of diabetes as well as schizophrenia.  Her primary care physician is Dr. Iona Beard.   Past Medical History:  Diagnosis Date  . Blindness   . Diabetes mellitus type I ([place])   . GERD (gastroesophageal reflux disease)   . Hyperlipidemia   . Paroxysmal atrial fibrillation (HCC)   . Schizophrenia ([place])   . Sleep apnea   . Subarachnoid hemorrhage ([place])   . Systemic hypertension   . Vertigo     Patient Active Problem List   Diagnosis Date Noted  . Fatigue 06/16/2016  . Somnolence, daytime 06/16/2016  . Rosai-Dorfman disease ([place])   . Blind   . Schizophrenia ([place])   . Diabetes mellitus type 2 in nonobese (HCC)   . Acute encephalopathy   . Brain mass   . Subarachnoid hemorrhage following injury, no loss of consciousness ([place]) 09/18/2015  . SAH (subarachnoid hemorrhage) ([place]) 09/17/2015  . Facial droop 12/04/2013  . 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 Blind in both eyes 05/31/2013  . HTN (hypertension) 10/11/2012  . PAF (paroxysmal atrial fibrillation) ([place]) 10/11/2012  . Dyslipidemia 10/11/2012  . H/O brain tumor 10/11/2012  . Insomnia due to mental disorder 12/29/2011  . Libido, decreased  12/29/2011  . Depression 04/14/2011    Past Surgical History:  Procedure Laterality Date  . BRAIN TUMOR EXCISION    . NM MYOCAR PERF WALL MOTION  02/01/2007   no significant ischemia  . US ECHOCARDIOGRAPHY  12/20/2003   mild mitral annular ca+,mild MR,TR,PI,AOV mildly sclerotic    OB History    No data available       Home Medications    Prior to Admission medications   Medication Sig Start Date End Date Taking? Authorizing Provider  acetaminophen (TYLENOL) 500 MG tablet Take 1,000 mg by mouth every 6 (six) hours as needed for mild pain.   Yes [provider]  ALPRAZolam Duanne Moron) 0.25 MG tablet TAKE 1 TABLET BY MOUTH THREE TIMES DAILY AS NEEDED FOR ANXIETY 03/29/17  Yes Cloria Spring, MD  diltiazem (CARDIZEM CD) 180 MG 24 hr capsule Take 1 capsule (180 mg total) by mouth daily. 12/29/16  Yes Croitoru, Mihai, MD  famotidine (PEPCID) 20 MG tablet Take 20 mg by mouth daily.  01/08/11  Yes [provider]  insulin NPH Human (HUMULIN N,NOVOLIN N) 100 UNIT/ML injection Inject 0.18 mLs (18 Units total) into the skin 2 (two) times daily at 8 am and 10 pm. 09/26/15  Yes Rosalin Hawking, MD  loratadine (CLARITIN) 10 MG tablet Take 10 mg by mouth daily as needed for allergies.  09/11/15  Yes [provider]  PARoxetine (PAXIL) 40 MG tablet Take 1 tablet (40 mg total)  at bedtime by mouth. 12/22/16  Yes Cloria Spring, MD  risperiDONE (RISPERDAL) 2 MG tablet Take 1 tablet (2 mg total) by mouth at bedtime. 10/22/16  Yes Cloria Spring, MD  rosuvastatin (CRESTOR) 10 MG tablet Take 1 tablet (10 mg total) by mouth every evening. 12/29/16  Yes Croitoru, Mihai, MD  temazepam (RESTORIL) 15 MG capsule Take 1 capsule (15 mg total) at bedtime as needed by mouth for sleep. 12/22/16  Yes Cloria Spring, MD  traZODone (DESYREL) 50 MG tablet Take 1 tablet (50 mg total) by mouth at bedtime. 07/22/16  Yes Cloria Spring, MD    Family History Family History  Problem Relation Age of  Onset  . ADD / ADHD Neg Hx   . Alcohol abuse Neg Hx   . Drug abuse Neg Hx   . Anxiety disorder Neg Hx   . Bipolar disorder Neg Hx   . Dementia Neg Hx   . Depression Neg Hx   . OCD Neg Hx   . Paranoid behavior Neg Hx   . Schizophrenia Neg Hx   . Seizures Neg Hx   . Sexual abuse Neg Hx   . Physical abuse Neg Hx     Social History Social History   Tobacco Use  . Smoking status: Never Smoker  . Smokeless tobacco: Never Used  Substance Use Topics  . Alcohol use: No  . Drug use: No     Allergies   Sulfa antibiotics; Ibuprofen; and Shellfish allergy   Review of Systems Review of Systems  Unable to perform ROS: Mental status change     Physical Exam Updated Vital Signs BP (!) 150/69   Pulse (!) 57   Temp 97.7 F (36.5 C)   Resp 11   Wt 59 kg (130 lb)   SpO2 100%   BMI 19.77 kg/m   Physical Exam  Constitutional: She appears well-developed and well-nourished. No distress.  HENT:  Head: Normocephalic and atraumatic.  Eyes: EOM are normal.  Neck: Normal range of motion.  Cardiovascular: Normal rate and regular rhythm.  Pulmonary/Chest: Effort normal and breath sounds normal.  Abdominal: Soft. She exhibits no distension. There is no tenderness.  Musculoskeletal: Normal range of motion.  Neurological:  Opens eyes to voice.  Follows simple commands.  Grip strength equal bilaterally.  Patient noncommunicative.  Skin: Skin is warm and dry.  Psychiatric: She has a normal mood and affect. Judgment normal.  Nursing note and vitals reviewed.    ED Treatments / Results  Labs (all labs ordered are listed, but only abnormal results are displayed) Labs Reviewed  COMPREHENSIVE METABOLIC PANEL - Abnormal; Notable for the following components:      Result Value   Potassium 2.9 (*)    Glucose, Bld 142 (*)    BUN <5 (*)    Calcium 11.9 (*)    ALT 13 (*)    All other components within normal limits  I-STAT CHEM 8, ED - Abnormal; Notable for the following components:     Potassium 3.1 (*)    BUN 5 (*)    Glucose, Bld 141 (*)    Calcium, Ion 1.52 (*)    TCO2 33 (*)    All other components within normal limits  PROTIME-INR  APTT  CBC  DIFFERENTIAL  I-STAT TROPONIN, ED  CBG MONITORING, ED    EKG  EKG Interpretation None       Radiology Ct Head Wo Contrast  Result Date: 04/19/2017 CLINICAL DATA:  67 year old female  with a history of fall EXAM: CT HEAD WITHOUT CONTRAST TECHNIQUE: Contiguous axial images were obtained from the base of the skull through the vertex without intravenous contrast. COMPARISON:  MRI 09/24/2015, CT head 09/19/2015, 09/18/2015 FINDINGS: Brain: Interval development of vasogenic edema of the left hemisphere, predominantly involving the external capsule and left temporal lobe, extending along white matter tracts of the centrum semi ovale and posteriorly towards the parietal and occipital lobes. There is resultant mass effect, with distortion of the left lateral ventricle as well as left to right midline shift at the foramen of Monro approximately 3 mm. Associated indistinctness of the left-sided sulci of temporal lobe and parietal lobe Brain volume loss, similar pattern to comparison CT. Vascular: Mild atherosclerotic changes. Skull: Surgical changes of suboccipital craniotomy. No acute displaced fracture. Heterogeneous appearance of the calvarium, similar prior CT. Sinuses/Orbits: No significant paranasal sinus disease. Unremarkable appearance of the orbits. Other: None IMPRESSION: Extensive left-sided vasogenic edema involving external capsule, left-sided centrum semi ovale, extending into the temporal lobe and parietal lobe. There is associated mass effect with compression of the left lateral ventricle and 3 mm of left-to-right midline shift at the foramen of Monro. Indistinctness of the left-sided sulci may be secondary to mass effect, however, new metastatic disease or manifestation of the patient's known histiocytosis may be the  underlying etiology. Further evaluation with contrast-enhanced MRI is recommended. These results were called by telephone at the time of interpretation on 04/19/2017 at 2:12 pm to Dr. Jola Schmidt , who verbally acknowledged these results. Electronically Signed   By: Corrie Mckusick D.O.   On: 04/19/2017 14:17    Procedures .Critical Care Performed by: Jola Schmidt, MD Authorized by: Jola Schmidt, MD     CRITICAL CARE Performed by: Jola Schmidt Total critical care time: 35 minutes Critical care time was exclusive of separately billable procedures and treating other patients. Critical care was necessary to treat or prevent imminent or life-threatening deterioration. Critical care was time spent personally by me on the following activities: development of treatment plan with patient and/or surrogate as well as nursing, discussions with consultants, evaluation of patient's response to treatment, examination of patient, obtaining history from patient or surrogate, ordering and performing treatments and interventions, ordering and review of laboratory studies, ordering and review of radiographic studies, pulse oximetry and re-evaluation of patient's condition.   Medications Ordered in ED Medications  sodium chloride 0.9 % bolus 1,000 mL (0 mLs Intravenous Stopped 04/19/17 1609)    Followed by  0.9 %  sodium chloride infusion (1,000 mLs Intravenous New Bag/Given 04/19/17 1625)  dexamethasone (DECADRON) injection 10 mg (10 mg Intravenous Given 04/19/17 1537)     Initial Impression / Assessment and Plan / ED Course  I have reviewed the triage vital signs and the nursing notes.  Pertinent labs & imaging results that were available during my care of the patient were reviewed by me and considered in my medical decision making (see chart for details).     Significant mass-effect in the left brain with associated vasogenic edema.  Suspect underlying mass.  Patient will need MRI brain with and  without.  She does have a history of Rosai-Dorfman disease and this may represent complication from this.  Metastatic disease could present as this as well.  A more definitive diagnosis can be made once MRI can be completed.  She will need admission the hospital.  IV Decadron now.  No indication for intubation.  She does have some midline shift. Seizure is a possibility,  but she is following commands  Final Clinical Impressions(s) / ED Diagnoses   Final diagnoses:  None    ED Discharge Orders    None       Jola Schmidt, MD 04/19/17 [phone]

## 2017-04-19 NOTE — ED Notes (Signed)
Pt family at bedside states that the patient began having weakness on Saturday. LSN 04/17/17. Pt speaks very little at baseline per husband.

## 2017-04-20 ENCOUNTER — Other Ambulatory Visit: Payer: Self-pay | Admitting: Neurosurgery

## 2017-04-20 ENCOUNTER — Inpatient Hospital Stay (HOSPITAL_COMMUNITY): Payer: BLUE CROSS/BLUE SHIELD

## 2017-04-20 DIAGNOSIS — R4182 Altered mental status, unspecified: Secondary | ICD-10-CM

## 2017-04-20 DIAGNOSIS — G9340 Encephalopathy, unspecified: Secondary | ICD-10-CM

## 2017-04-20 LAB — GLUCOSE, CAPILLARY
Glucose-Capillary: 157 mg/dL — ABNORMAL HIGH (ref 65–99)
Glucose-Capillary: 170 mg/dL — ABNORMAL HIGH (ref 65–99)

## 2017-04-20 LAB — CBC
HCT: 38.7 % (ref 36.0–46.0)
Hemoglobin: 12.7 g/dL (ref 12.0–15.0)
MCH: 30.2 pg (ref 26.0–34.0)
MCHC: 32.8 g/dL (ref 30.0–36.0)
MCV: 92.1 fL (ref 78.0–100.0)
PLATELETS: 182 10*3/uL (ref 150–400)
RBC: 4.2 MIL/uL (ref 3.87–5.11)
RDW: 12.8 % (ref 11.5–15.5)
WBC: 6.6 10*3/uL (ref 4.0–10.5)

## 2017-04-20 LAB — CBG MONITORING, ED
GLUCOSE-CAPILLARY: 165 mg/dL — AB (ref 65–99)
GLUCOSE-CAPILLARY: 123 mg/dL — AB (ref 65–99)
Glucose-Capillary: 173 mg/dL — ABNORMAL HIGH (ref 65–99)
Glucose-Capillary: 183 mg/dL — ABNORMAL HIGH (ref 65–99)

## 2017-04-20 LAB — HEMOGLOBIN A1C
Hgb A1c MFr Bld: 7.4 % — ABNORMAL HIGH (ref 4.8–5.6)
Mean Plasma Glucose: 165.68 mg/dL

## 2017-04-20 LAB — COMPREHENSIVE METABOLIC PANEL
ALBUMIN: 3 g/dL — AB (ref 3.5–5.0)
ALT: 13 U/L — AB (ref 14–54)
AST: 14 U/L — ABNORMAL LOW (ref 15–41)
Alkaline Phosphatase: 52 U/L (ref 38–126)
Anion gap: 8 (ref 5–15)
BUN: 10 mg/dL (ref 6–20)
CALCIUM: 11.3 mg/dL — AB (ref 8.9–10.3)
CO2: 27 mmol/L (ref 22–32)
CREATININE: 0.7 mg/dL (ref 0.44–1.00)
Chloride: 109 mmol/L (ref 101–111)
GFR calc Af Amer: >60 mL/min (ref >60)
GFR calc non Af Amer: >60 mL/min (ref >60)
GLUCOSE: 181 mg/dL — AB (ref 65–99)
Potassium: 3.7 mmol/L (ref 3.5–5.1)
SODIUM: 144 mmol/L (ref 135–145)
Total Bilirubin: 0.7 mg/dL (ref 0.3–1.2)
Total Protein: 6.1 g/dL — ABNORMAL LOW (ref 6.5–8.1)

## 2017-04-20 LAB — PHOSPHORUS: Phosphorus: 3 mg/dL (ref 2.5–4.6)

## 2017-04-20 LAB — TSH: TSH: 0.421 u[IU]/mL (ref 0.350–4.500)

## 2017-04-20 LAB — MAGNESIUM: Magnesium: 2 mg/dL (ref 1.7–2.4)

## 2017-04-20 LAB — HIV ANTIBODY (ROUTINE TESTING W REFLEX): HIV Screen 4th Generation wRfx: NONREACTIVE

## 2017-04-20 LAB — MRSA PCR SCREENING: MRSA by PCR: NEGATIVE

## 2017-04-20 MED ORDER — ORAL CARE MOUTH RINSE
15.0000 mL | Freq: Two times a day (BID) | OROMUCOSAL | Status: DC
  Administered 2017-04-20 – 2017-04-22 (×5): 15 mL via OROMUCOSAL

## 2017-04-20 MED ORDER — SODIUM CHLORIDE 0.9 % IV SOLN
1000.0000 mL | INTRAVENOUS | Status: DC
  Administered 2017-04-20 – 2017-04-21 (×2): 1000 mL via INTRAVENOUS

## 2017-04-20 MED ORDER — LORAZEPAM 2 MG/ML IJ SOLN
0.5000 mg | Freq: Four times a day (QID) | INTRAMUSCULAR | Status: DC
  Administered 2017-04-20 – 2017-04-21 (×4): 0.5 mg via INTRAVENOUS
  Filled 2017-04-20 (×4): qty 1

## 2017-04-20 MED ORDER — SODIUM CHLORIDE 0.9 % IV SOLN
30.0000 mg | Freq: Once | INTRAVENOUS | Status: AC
  Administered 2017-04-20: 30 mg via INTRAVENOUS
  Filled 2017-04-20: qty 10

## 2017-04-20 MED ORDER — PANTOPRAZOLE SODIUM 40 MG IV SOLR
40.0000 mg | INTRAVENOUS | Status: DC
  Administered 2017-04-20 – 2017-04-23 (×4): 40 mg via INTRAVENOUS
  Filled 2017-04-20 (×5): qty 40

## 2017-04-20 MED ORDER — METOPROLOL TARTRATE 5 MG/5ML IV SOLN
5.0000 mg | Freq: Four times a day (QID) | INTRAVENOUS | Status: DC
  Administered 2017-04-20 – 2017-05-01 (×12): 5 mg via INTRAVENOUS
  Filled 2017-04-20 (×17): qty 5

## 2017-04-20 NOTE — ED Notes (Signed)
Neurology paged regarding neuro check

## 2017-04-20 NOTE — Progress Notes (Signed)
PROGRESS NOTE    IMOGINE CARVELL  YWV:[id number] DOB: Oct 13, 1950 DOA: 04/19/2017 PCP: Iona Beard, MD  Brief Narrative:Staceyann Viona Gilmore Bertram is a 67 y.o. female with medical history significant for Rosai Dorfman's disease  with remote occipital craniectomy in 2008 and resection of extradural lesion. Also has paroxysmal atrial fibrillation, hypertension, hyperlipidemia and diabetes mellitus type 2 on insulin, and schizophrenia, blindness, OSA. She was brought to the emergency room by her husband last night with worsening right-sided weakness, facial droop, poor appetite, inability to eat and ongoing failure to thrive  Assessment & Plan:   1. Enlarging LEFT dural masses, 2.5 x 3.3 cm /cerebral edema -with severe LEFT cerebral edema, potential parenchymal invasion or infiltrative tumor. -per MRI Findings attributed to patient's Rosai-Dorfman disease -he continue IV Decadron, neurosurgery consulting, plan for surgical resection later this week when stable  2. Acute encephalopathy -Secondary to enlarging left subdural masses and severe cerebral edema -Continue IV Decadron  -Supportive care, continue IV fluids -SLP evaluation today, may need NG tube for tube feeds if mentation does not allow safe swallowing  3. Paroxysmal atrial fibrillation -In sinus rhythm and tachycardic at this time -Unable to take by mouth Cardizem, will give IV metoprolol while NPO -He is not a candidate for anticoagulation at this time  4. Anxiety/depression/schizophrenia -patient takes Xanax, temazepam, trazodone, risperidone and Paxil at home -will put her on IV Ativan since unable to take by mouth benzos, to prevent withdrawal seizures -Resume Risperdal and Paxil pending swallowing assessment  5. Diabetes mellitus -Hold NPH -Sliding-scale insulin for now   6. Hypercalcemia -Likely related to dehydration and underlying histiocytosis/Rosai Dorfman -IVF for now, will give a dose of Pamidronate x1  DVT  prophylaxis: SCDs Code Status: F[place] Family Communication: Spouse at bedside Disposition Plan: Keep in SDU for now  Consultants:  Neurosurgery   Procedures:   Antimicrobials:    Subjective: -confused, restless, tachycardic    Objective: Vitals:   04/20/17 1000 04/20/17 1001 04/20/17 1001 04/20/17 1111  BP: 118/64 (!) 152/78  (!) 165/97  Pulse: 83 83  (!) 114  Resp: 18 20  (!) 22  Temp:      SpO2: 100% 99% 100% 100%  Weight:        Intake/Output Summary (Last 24 hours) at 04/20/2017 1217 Last data filed at 04/20/2017 1111 Gross per 24 hour  Intake 1620 ml  Output -  Net 1620 ml   Filed Weights   04/19/17 1236  Weight: 59 kg (130 lb)    Examination: Gen: thinly built, frail African-American female, acute and chronically ill-appearing HEENT: oral mucosa is dry, pupils reactive Lungs: poor air movement, decreased breath sounds at bases CVS: S1-S2/regular rate rhythm, tachycardic Abd: soft, Non tender, non distended, BS present Extremities: no edema Skin: no new rashes Neuro: obtunded,, mumbles a few words, increased tone and rigidity, moves all extremities intermittently to command, weaker on the right Psychiatry: anxious, restless    Data Reviewed:   CBC: Recent Labs  Lab 04/19/17 1237 04/19/17 1257 04/20/17 0457  WBC 8.3  --  6.6  NEUTROABS 4.6  --   --   HGB 14.3 14.3 12.7  HCT 42.4 42.0 38.7  MCV 92.6  --  92.1  PLT 197  --  854   Basic Metabolic Panel: Recent Labs  Lab 04/19/17 1237 04/19/17 1257 04/20/17 0457  NA 144 145 144  K 2.9* 3.1* 3.7  CL 106 102 109  CO2 29  --  27  GLUCOSE 142* 141*  181*  BUN <5* 5* 10  CREATININE 0.68 0.60 0.70  CALCIUM 11.9*  --  11.3*  MG 2.1  --  2.0  PHOS 3.0  --  3.0   GFR: Estimated Creatinine Clearance: 66.2 mL/min (by C-G formula based on SCr of 0.7 mg/dL). Liver Function Tests: Recent Labs  Lab 04/19/17 1237 04/20/17 0457  AST 19 14*  ALT 13* 13*  ALKPHOS 60 52  BILITOT 0.7 0.7    PROT 6.6 6.1*  ALBUMIN 3.5 3.0*   No results for input(s): LIPASE, AMYLASE in the last 168 hours. No results for input(s): AMMONIA in the last 168 hours. Coagulation Profile: Recent Labs  Lab 04/19/17 1237  INR 1.01   Cardiac Enzymes: No results for input(s): CKTOTAL, CKMB, CKMBINDEX, TROPONINI in the last 168 hours. BNP (last 3 results) No results for input(s): PROBNP in the last 8760 hours. HbA1C: Recent Labs    04/20/17 0457  HGBA1C 7.4*   CBG: Recent Labs  Lab 04/20/17 0048 04/20/17 0340 04/20/17 0406 04/20/17 0751  GLUCAP 183* 165* 173* 123*   Lipid Profile: No results for input(s): CHOL, HDL, LDLCALC, TRIG, CHOLHDL, LDLDIRECT in the last 72 hours. Thyroid Function Tests: Recent Labs    04/20/17 0458  TSH 0.421   Anemia Panel: No results for input(s): VITAMINB12, FOLATE, FERRITIN, TIBC, IRON, RETICCTPCT in the last 72 hours. Urine analysis:    Component Value Date/Time   COLORURINE YELLOW 04/19/2017 2252   APPEARANCEUR HAZY (A) 04/19/2017 2252   LABSPEC 1.008 04/19/2017 2252   PHURINE 7.0 04/19/2017 2252   GLUCOSEU NEGATIVE 04/19/2017 2252   HGBUR NEGATIVE 04/19/2017 2252   BILIRUBINUR NEGATIVE 04/19/2017 2252   KETONESUR 5 (A) 04/19/2017 2252   PROTEINUR NEGATIVE 04/19/2017 2252   UROBILINOGEN 0.2 12/04/2013 1518   NITRITE NEGATIVE 04/19/2017 2252   LEUKOCYTESUR NEGATIVE 04/19/2017 2252   Sepsis Labs: @LABRCNTIP (procalcitonin:4,lacticidven:4)  )No results found for this or any previous visit (from the past 240 hour(s)).       Radiology Studies: Ct Head Wo Contrast  Result Date: 04/19/2017 CLINICAL DATA:  67 year old female with a history of fall EXAM: CT HEAD WITHOUT CONTRAST TECHNIQUE: Contiguous axial images were obtained from the base of the skull through the vertex without intravenous contrast. COMPARISON:  MRI 09/24/2015, CT head 09/19/2015, 09/18/2015 FINDINGS: Brain: Interval development of vasogenic edema of the left hemisphere,  predominantly involving the external capsule and left temporal lobe, extending along white matter tracts of the centrum semi ovale and posteriorly towards the parietal and occipital lobes. There is resultant mass effect, with distortion of the left lateral ventricle as well as left to right midline shift at the foramen of Monro approximately 3 mm. Associated indistinctness of the left-sided sulci of temporal lobe and parietal lobe Brain volume loss, similar pattern to comparison CT. Vascular: Mild atherosclerotic changes. Skull: Surgical changes of suboccipital craniotomy. No acute displaced fracture. Heterogeneous appearance of the calvarium, similar prior CT. Sinuses/Orbits: No significant paranasal sinus disease. Unremarkable appearance of the orbits. Other: None IMPRESSION: Extensive left-sided vasogenic edema involving external capsule, left-sided centrum semi ovale, extending into the temporal lobe and parietal lobe. There is associated mass effect with compression of the left lateral ventricle and 3 mm of left-to-right midline shift at the foramen of Monro. Indistinctness of the left-sided sulci may be secondary to mass effect, however, new metastatic disease or manifestation of the patient's known histiocytosis may be the underlying etiology. Further evaluation with contrast-enhanced MRI is recommended. These results were called by telephone at  the time of interpretation on 04/19/2017 at 2:12 pm to Dr. Jola Schmidt , who verbally acknowledged these results. Electronically Signed   By: Corrie Mckusick D.O.   On: 04/19/2017 14:17   Mr Jeri Cos And Wo Contrast  Result Date: 04/19/2017 CLINICAL DATA:  Generalized weakness and anorexia for few days. Decreased speech. History of Rosai-Dorfman disease, brain tumor resection. EXAM: MRI HEAD WITHOUT AND WITH CONTRAST TECHNIQUE: Multiplanar, multiecho pulse sequences of the brain and surrounding structures were obtained without and with intravenous contrast. CONTRAST:   47mL MULTIHANCE GADOBENATE DIMEGLUMINE 529 MG/ML IV SOLN COMPARISON:  MRI of the head September 24, 2015 and CT HEAD April 19, 2017 FINDINGS: INTRACRANIAL CONTENTS: LEFT dural thickening with 2 dural-based masses again noted. Anterior temporal extra-axial solidly enhancing mass was 16 x 24 mm, now 15 x 21 mm. The more posterior mass was 0.7 x 1.2 cm, now 2.5 x 3.3 cm corresponding to CT abnormality. Mass effect on LEFT temporal lobes with severe T2 bright presume vasogenic edema LEFT temporal frontal and parietal lobes including posterior limb of the internal capsule. Regional mass effect with 3 mm new LEFT-to-RIGHT midline shift. LEFT uncal herniation. Partial effacement LEFT lateral ventricle without RIGHT ventricle entrapment or, hydrocephalus. Hemosiderin staining mesial LEFT parietal lobe at site of prior hemorrhage. Numerous scattered chronic micro hemorrhages within supra-and infratentorial brain. Moderate to severe parenchymal brain volume loss. No abnormal extra-axial fluid collections. VASCULAR: Normal major intracranial vascular flow voids present at skull base. SKULL AND UPPER CERVICAL SPINE: No abnormal sellar expansion. Similar heterogeneous calvarial bone marrow signal. Craniocervical junction maintained. Status post suboccipital decompressive craniectomy. SINUSES/ORBITS: The mastoid air-cells and included paranasal sinuses are well-aerated.T2 bright signal and atrophic optic nerves and chiasm consistent with history of blindness. OTHER: None. IMPRESSION: 1. Interval progression of LEFT dural masses, largest component measuring 2.5 x 3.3 cm, previously 0.7 x 1.2 cm. Severe LEFT cerebral edema, potential parenchymal invasion or infiltrative tumor. Findings attributed to patient's Rosai-Dorfman disease (which has associated perilesional severe edema) less likely meningiomatosis. 2. Regional mass effect with 3 mm LEFT-to-RIGHT midline shift and, LEFT uncal herniation. 3. Mesial LEFT parietal hemosiderin  staining and numerous chronic micro hemorrhages. 4. Moderate to severe parenchymal brain volume loss. Electronically Signed   By: Elon Alas M.D.   On: 04/19/2017 21:46        Scheduled Meds: . dexamethasone  10 mg Intravenous Q8H  . insulin aspart  0-9 Units Subcutaneous Q4H  . insulin glargine  15 Units Subcutaneous QHS  . LORazepam  0.5 mg Intravenous Q6H  . mouth rinse  15 mL Mouth Rinse BID   Continuous Infusions: . sodium chloride Stopped (04/19/17 2245)     LOS: 1 day    Time spent: 76min    Domenic Polite, MD [facility] Page via [email or web address], password [email or web address] After 7PM please contact night-coverage  04/20/2017, 12:17 PM

## 2017-04-20 NOTE — Evaluation (Signed)
Physical Therapy Evaluation Patient Details Name: Rachel [name] MRN: [id number] DOB: 09-Aug-1950 Today's Date: 04/20/2017   History of Present Illness  67 yo female with attentive husband was admitted with worsening R side weakness and aphasia, now has increased L sphenoid wing and L lateral middle fossa dural lesions increasing in size and brain edema.  On steroids and being considered for surgery.  PMHx:  blindness, DM, HLD, PAF, schizophrenia, subarachnoid hemorrhage, HTN, vertigo, tumor excision 02/01/07  Clinical Impression  Pt is up to side of bed with PT with max assist and note fine tremors with extremities that per husband are new.  Pt is minimally assisting to move L side and not at all on R, but with some coaching could finally support herself sitting briefly.  Her husband is concerned about her treatment and spoke with nursing to offer explanation for how care is being handled.  Per nsg pt may be having neurosurgery depending on how steroids are affecting her cranial edema and symptoms.  Will follow acutely for strengthening and to encourage mobility.    Follow Up Recommendations CIR    Equipment Recommendations  None recommended by PT    Recommendations for Other Services Rehab consult     Precautions / Restrictions Precautions Precautions: Fall(on telemetry, nonverbal with PT) Restrictions Weight Bearing Restrictions: No      Mobility  Bed Mobility Overal bed mobility: Needs Assistance Bed Mobility: Supine to Sit;Sit to Supine     Supine to sit: Max assist Sit to supine: Max assist   General bed mobility comments: Pt did initiate transition back to bed as she is not wanting to sit up longer  Transfers Overall transfer level: Needs assistance               General transfer comment: Pt is not currently able to assist with standing(holding legs in a fetal position and relaxing very little, )  Ambulation/Gait             General Gait Details: non  ambulatory  Stairs            Wheelchair Mobility    Modified Rankin (Stroke Patients Only) Modified Rankin (Stroke Patients Only) Pre-Morbid Rankin Score: Slight disability Modified Rankin: Severe disability     Balance Overall balance assessment: Needs assistance Sitting-balance support: (until end of session pt needed support not to fall forward) Sitting balance-Leahy Scale: Poor                                       Pertinent Vitals/Pain Pain Assessment: Faces Faces Pain Scale: No hurt    Home Living Family/patient expects to be discharged to:: Private residence Living Arrangements: Spouse/significant other                    Prior Function Level of Independence: Needs assistance   Gait / Transfers Assistance Needed: HHA with husband to walk short trips in the house  ADL's / Homemaking Assistance Needed: husband cares for wife and the home  Comments: pt is nonverbal with PT although she was not talkative prior to this hospitalization per husband     Hand Dominance        Extremity/Trunk Assessment   Upper Extremity Assessment Upper Extremity Assessment: Generalized weakness;Difficult to assess due to impaired cognition(pt is not aware of her L hand being tucked under L hip)    Lower Extremity Assessment Lower  Extremity Assessment: Generalized weakness;Difficult to assess due to impaired cognition    Cervical / Trunk Assessment Cervical / Trunk Assessment: Kyphotic  Communication   Communication: Expressive difficulties  Cognition Arousal/Alertness: Awake/alert Behavior During Therapy: Flat affect(attempts to move on command) Overall Cognitive Status: History of cognitive impairments - at baseline                                 General Comments: husband reports that pt follows instructions and talked a little prior to this admit but different since 2008 craniectomy      General Comments General comments  (skin integrity, edema, etc.): Pt has very blank expression and limited active  movement, controlled her movements by flexing into legs and trunk, arms in her lap sitting    Exercises     Assessment/Plan    PT Assessment Patient needs continued PT services  PT Problem List Decreased strength;Decreased range of motion;Decreased activity tolerance;Decreased balance;Decreased mobility;Decreased coordination;Decreased cognition;Decreased safety awareness;Cardiopulmonary status limiting activity       PT Treatment Interventions DME instruction;Functional mobility training;Therapeutic activities;Therapeutic exercise;Balance training;Neuromuscular re-education;Patient/family education    PT Goals (Current goals can be found in the Care Plan section)  Acute Rehab PT Goals Patient Stated Goal: none stated, nearly nonverbal PT Goal Formulation: With family Time For Goal Achievement: 05/04/17 Potential to Achieve Goals: Fair    Frequency Min 3X/week   Barriers to discharge Other (comment)(pt is total care currently) husband is caregiver and pt is total care    Co-evaluation               AM-PAC PT "6 Clicks" Daily Activity  Outcome Measure Difficulty turning over in bed (including adjusting bedclothes, sheets and blankets)?: Unable Difficulty moving from lying on back to sitting on the side of the bed? : Unable Difficulty sitting down on and standing up from a chair with arms (e.g., wheelchair, bedside commode, etc,.)?: Unable Help needed moving to and from a bed to chair (including a wheelchair)?: Total Help needed walking in hospital room?: Total Help needed climbing 3-5 steps with a railing? : Total 6 Click Score: 6    End of Session   Activity Tolerance: Patient limited by lethargy;Treatment limited secondary to medical complications (Comment)(pulses up to 120 sitting) Patient left: in bed;with call bell/phone within reach;with family/visitor present;with nursing/sitter in  room(nursing stepped in to ck on pt) Nurse Communication: Mobility status;Other (comment)(rehab plans) PT Visit Diagnosis: Muscle weakness (generalized) (M62.81);Difficulty in walking, not elsewhere classified (R26.2);Other abnormalities of gait and mobility (R26.89);Adult, failure to thrive (R62.7);Hemiplegia and hemiparesis(R side neglect) Hemiplegia - Right/Left: Right Hemiplegia - dominant/non-dominant: Dominant Hemiplegia - caused by: Nontraumatic SAH;Cerebral infarction(L dural mass)    Time: 7371-0626 PT Time Calculation (min) (ACUTE ONLY): 18 min   Charges:   PT Evaluation $PT Eval Moderate Complexity: 1 Mod     PT G Codes:   PT G-Codes **NOT FOR INPATIENT CLASS** Functional Assessment Tool Used: AM-PAC 6 Clicks Basic Mobility    Ramond Dial 04/20/2017, 12:57 PM   Mee Hives, PT MS Acute Rehab Dept. Number: [place] [facility] [place]

## 2017-04-20 NOTE — Care Management Note (Signed)
Case Management Note  Patient Details  Name: Rachel [name] MRN: [id number] Date of Birth: Jun 15, 1950  Subjective/Objective: 67 yo female admitted with worsening R side weakness and aphasia, now has increased L sphenoid wing and L lateral middle fossa dural lesions increasing in size and brain edema.  PTA, pt resided at home with husband, who provides assistance with ADLs.                   Action/Plan: PT recommending CIR; plan holding on consult, as pt may need surgery.  Will follow progress.   Expected Discharge Date:                  Expected Discharge Plan:  [place]  In-House Referral:     Discharge planning Services  CM Consult  Post Acute Care Choice:    Choice offered to:     DME Arranged:    DME Agency:     HH Arranged:    [place] Agency:     Status of Service:  In process, will continue to follow  If discussed at Long Length of Stay Meetings, dates discussed:    Additional Comments:  Reinaldo Raddle, RN, BSN  Trauma/Neuro ICU Case Manager [phone]

## 2017-04-20 NOTE — Consult Note (Signed)
Reason for Consult: Extra-axial brain tumor Referring Physician: [facility] department  Rachel [name] is an 67 y.o. female.  HPI: 67 year old female with history of Rosai-Dorfman's disease as well as diabetes mellitus and schizophrenia presents with worsening right-sided weakness and aphasia.  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  Patient admitted for treatment with IV steroids and possible later surgical resection.  No history of seizure.  Patient has a history of blindness.  She is status post a remote occipital craniectomy and resection of extradural lesion years ago. Past Medical History:  Diagnosis Date  . Blindness   . Diabetes mellitus type I ([place])   . GERD (gastroesophageal reflux disease)   . Hyperlipidemia   . Paroxysmal atrial fibrillation ([facility])   . Schizophrenia ([place])   . Sleep apnea   . Subarachnoid hemorrhage ([place])   . Systemic hypertension   . Vertigo     Past Surgical History:  Procedure Laterality Date  . BRAIN TUMOR EXCISION    . NM MYOCAR PERF WALL MOTION  02/01/2007   no significant ischemia  . US ECHOCARDIOGRAPHY  12/20/2003   mild mitral annular ca+,mild MR,TR,PI,AOV mildly sclerotic    Family History  Problem Relation Age of Onset  . ADD / ADHD Neg Hx   . Alcohol abuse Neg Hx   . Drug abuse Neg Hx   . Anxiety disorder Neg Hx   . Bipolar disorder Neg Hx   . Dementia Neg Hx   . Depression Neg Hx   . OCD Neg Hx   . Paranoid behavior Neg Hx   . Schizophrenia Neg Hx   . Seizures Neg Hx   . Sexual abuse Neg Hx   . Physical abuse Neg Hx     Social History:  reports that  has never smoked. she has never used smokeless tobacco. She reports that she does not drink alcohol or use drugs.  Allergies:  Allergies  Allergen Reactions  . Sulfa Antibiotics Anaphylaxis  . Ibuprofen Other (See Comments)    unknown  . Shellfish Allergy Swelling     Mouth    Medications: I have reviewed the patient's current medications.  Results for orders placed or performed during the hospital encounter of 04/19/17 (from the past 48 hour(s))  Protime-INR     Status: None   Collection Time: 04/19/17 12:37 PM  Result Value Ref Range   Prothrombin Time 13.2 11.4 - 15.2 seconds   INR 1.01     Comment: Performed at [facility], [place] [address], [place], [place] [address]  APTT     Status: None   Collection Time: 04/19/17 12:37 PM  Result Value Ref Range   aPTT 27 24 - 36 seconds    Comment: Performed at [facility] [address] [place] [address]  CBC     Status: None   Collection Time: 04/19/17 12:37 PM  Result Value Ref Range   WBC 8.3 4.0 - 10.5 K/uL   RBC 4.58 3.87 - 5.11 MIL/uL   Hemoglobin 14.3 12.0 - 15.0 g/dL   HCT 42.4 36.0 - 46.0 %   MCV 92.6 78.0 - 100.0 fL   MCH 31.2 26.0 - 34.0 pg   MCHC 33.7 30.0 - 36.0 g/dL   RDW 13.2 11.5 - 15.5 %   Platelets 197 150 - 400 K/uL    Comment: Performed at [facility], [address] [place], [place] [address]  Differential     Status: None   Collection Time: 04/19/17 12:37 PM  Result Value Ref Range   Neutrophils Relative % 54 %   Neutro Abs 4.6 1.7 - 7.7 K/uL   Lymphocytes Relative 36 %   Lymphs Abs 3.0 0.7 - 4.0 K/uL   Monocytes Relative 9 %   Monocytes Absolute 0.7 0.1 - 1.0 K/uL   Eosinophils Relative 1 %   Eosinophils Absolute 0.1 0.0 - 0.7 K/uL   Basophils Relative 0 %   Basophils Absolute 0.0 0.0 - 0.1 K/uL    Comment: Performed at [facility] [address], [place], [place] [address]  Comprehensive metabolic panel     Status: Abnormal   Collection Time: 04/19/17 12:37 PM  Result Value Ref Range   Sodium 144 135 - 145 mmol/L   Potassium 2.9 (L) 3.5 - 5.1 mmol/L   Chloride 106 101 - 111 mmol/L   CO2 29 22 - 32 mmol/L   Glucose, Bld 142 (H) 65 - 99 mg/dL   BUN <5 (L) 6 - 20 mg/dL   Creatinine, Ser 0.68 0.44 - 1.00 mg/dL   Calcium  11.9 (H) 8.9 - 10.3 mg/dL   Total Protein 6.6 6.5 - 8.1 g/dL   Albumin 3.5 3.5 - 5.0 g/dL   AST 19 15 - 41 U/L   ALT 13 (L) 14 - 54 U/L   Alkaline Phosphatase 60 38 - 126 U/L   Total Bilirubin 0.7 0.3 - 1.2 mg/dL   GFR calc non Af Amer >60 >60 mL/min   GFR calc Af Amer >60 >60 mL/min    Comment: (NOTE) The eGFR has been calculated using the CKD EPI equation. This calculation has not been validated in all clinical situations. eGFR's persistently <60 mL/min signify possible Chronic Kidney Disease.    Anion gap 9 5 - 15    Comment: Performed at [facility] [address] [place], [place] [address]  Magnesium     Status: None   Collection Time: 04/19/17 12:37 PM  Result Value Ref Range   Magnesium 2.1 1.7 - 2.4 mg/dL    Comment: Performed at [facility], [place] [address] [place], [place] [address]  Phosphorus     Status: None   Collection Time: 04/19/17 12:37 PM  Result Value Ref Range   Phosphorus 3.0 2.5 - 4.6 mg/dL    Comment: Performed at [facility] [address], [place], [place] [address]  I-stat troponin, ED     Status: None   Collection Time: 04/19/17 12:56 PM  Result Value Ref Range   Troponin i, poc 0.00 0.00 - 0.08 ng/mL   Comment 3            Comment: Due to the release kinetics of cTnI, a negative result within the first hours of the onset of symptoms does not rule out myocardial infarction with certainty. If myocardial infarction is still suspected, repeat the test at appropriate intervals.   I-Stat Chem 8, ED     Status: Abnormal   Collection Time: 04/19/17 12:57 PM  Result Value Ref Range   Sodium 145 135 - 145 mmol/L   Potassium 3.1 (L) 3.5 - 5.1 mmol/L   Chloride 102 101 - 111 mmol/L   BUN 5 (L) 6 - 20 mg/dL   Creatinine, Ser 0.60 0.44 - 1.00 mg/dL   Glucose, Bld 141 (H) 65 - 99 mg/dL  Calcium, Ion 1.52 (HH) 1.15 - 1.40 mmol/L   TCO2 33 (H) 22 - 32 mmol/L   Hemoglobin 14.3 12.0 - 15.0 g/dL   HCT 42.0 36.0 - 46.0 %   Comment  NOTIFIED PHYSICIAN   Urinalysis, Routine w reflex microscopic     Status: Abnormal   Collection Time: 04/19/17 10:52 PM  Result Value Ref Range   Color, Urine YELLOW YELLOW   APPearance HAZY (A) CLEAR   Specific Gravity, Urine 1.008 1.005 - 1.030   pH 7.0 5.0 - 8.0   Glucose, UA NEGATIVE NEGATIVE mg/dL   Hgb urine dipstick NEGATIVE NEGATIVE   Bilirubin Urine NEGATIVE NEGATIVE   Ketones, ur 5 (A) NEGATIVE mg/dL   Protein, ur NEGATIVE NEGATIVE mg/dL   Nitrite NEGATIVE NEGATIVE   Leukocytes, UA NEGATIVE NEGATIVE    Comment: Performed at [facility], [place] [address] [place], [place] [address]  CBG monitoring, ED     Status: Abnormal   Collection Time: 04/20/17 12:48 AM  Result Value Ref Range   Glucose-Capillary 183 (H) 65 - 99 mg/dL  CBG monitoring, ED     Status: Abnormal   Collection Time: 04/20/17  3:40 AM  Result Value Ref Range   Glucose-Capillary 165 (H) 65 - 99 mg/dL  CBG monitoring, ED     Status: Abnormal   Collection Time: 04/20/17  4:06 AM  Result Value Ref Range   Glucose-Capillary 173 (H) 65 - 99 mg/dL   Comment 1 Notify RN    Comment 2 Document in Chart   Hemoglobin A1c     Status: Abnormal   Collection Time: 04/20/17  4:57 AM  Result Value Ref Range   Hgb A1c MFr Bld 7.4 (H) 4.8 - 5.6 %    Comment: (NOTE) Pre diabetes:          5.7%-6.4% Diabetes:              >6.4% Glycemic control for   <7.0% adults with diabetes    Mean Plasma Glucose 165.68 mg/dL    Comment: Performed at [facility], [place] [address] [place], [place] [address]  Magnesium     Status: None   Collection Time: 04/20/17  4:57 AM  Result Value Ref Range   Magnesium 2.0 1.7 - 2.4 mg/dL    Comment: Performed at [facility] [address], [place], [place] [address]  Phosphorus     Status: None   Collection Time: 04/20/17  4:57 AM  Result Value Ref Range   Phosphorus 3.0 2.5 - 4.6 mg/dL    Comment: Performed at [facility] [address] [place], [place]  [address]  Comprehensive metabolic panel     Status: Abnormal   Collection Time: 04/20/17  4:57 AM  Result Value Ref Range   Sodium 144 135 - 145 mmol/L   Potassium 3.7 3.5 - 5.1 mmol/L   Chloride 109 101 - 111 mmol/L   CO2 27 22 - 32 mmol/L   Glucose, Bld 181 (H) 65 - 99 mg/dL   BUN 10 6 - 20 mg/dL   Creatinine, Ser 0.70 0.44 - 1.00 mg/dL   Calcium 11.3 (H) 8.9 - 10.3 mg/dL   Total Protein 6.1 (L) 6.5 - 8.1 g/dL   Albumin 3.0 (L) 3.5 - 5.0 g/dL   AST 14 (L) 15 - 41 U/L   ALT 13 (L) 14 - 54 U/L   Alkaline Phosphatase 52 38 - 126 U/L   Total Bilirubin 0.7  0.3 - 1.2 mg/dL   GFR calc non Af Amer >60 >60 mL/min   GFR calc Af Amer >60 >60 mL/min    Comment: (NOTE) The eGFR has been calculated using the CKD EPI equation. This calculation has not been validated in all clinical situations. eGFR's persistently <60 mL/min signify possible Chronic Kidney Disease.    Anion gap 8 5 - 15    Comment: Performed at [facility] [address], [place] [address]  CBC     Status: None   Collection Time: 04/20/17  4:57 AM  Result Value Ref Range   WBC 6.6 4.0 - 10.5 K/uL   RBC 4.20 3.87 - 5.11 MIL/uL   Hemoglobin 12.7 12.0 - 15.0 g/dL   HCT 38.7 36.0 - 46.0 %   MCV 92.1 78.0 - 100.0 fL   MCH 30.2 26.0 - 34.0 pg   MCHC 32.8 30.0 - 36.0 g/dL   RDW 12.8 11.5 - 15.5 %   Platelets 182 150 - 400 K/uL    Comment: Performed at [facility], [place] [address], [place], [place] [address]  TSH     Status: None   Collection Time: 04/20/17  4:58 AM  Result Value Ref Range   TSH 0.421 0.350 - 4.500 uIU/mL    Comment: Performed by a 3rd Generation assay with a functional sensitivity of <=0.01 uIU/mL. Performed at [facility], [place] [address] [place], [place] [address]   CBG monitoring, ED     Status: Abnormal   Collection Time: 04/20/17  7:51 AM  Result Value Ref Range   Glucose-Capillary 123 (H) 65 - 99 mg/dL    Ct Head Wo Contrast  Result Date: 04/19/2017 CLINICAL DATA:   67 year old female with a history of fall EXAM: CT HEAD WITHOUT CONTRAST TECHNIQUE: Contiguous axial images were obtained from the base of the skull through the vertex without intravenous contrast. COMPARISON:  MRI 09/24/2015, CT head 09/19/2015, 09/18/2015 FINDINGS: Brain: Interval development of vasogenic edema of the left hemisphere, predominantly involving the external capsule and left temporal lobe, extending along white matter tracts of the centrum semi ovale and posteriorly towards the parietal and occipital lobes. There is resultant mass effect, with distortion of the left lateral ventricle as well as left to right midline shift at the foramen of Monro approximately 3 mm. Associated indistinctness of the left-sided sulci of temporal lobe and parietal lobe Brain volume loss, similar pattern to comparison CT. Vascular: Mild atherosclerotic changes. Skull: Surgical changes of suboccipital craniotomy. No acute displaced fracture. Heterogeneous appearance of the calvarium, similar prior CT. Sinuses/Orbits: No significant paranasal sinus disease. Unremarkable appearance of the orbits. Other: None IMPRESSION: Extensive left-sided vasogenic edema involving external capsule, left-sided centrum semi ovale, extending into the temporal lobe and parietal lobe. There is associated mass effect with compression of the left lateral ventricle and 3 mm of left-to-right midline shift at the foramen of Monro. Indistinctness of the left-sided sulci may be secondary to mass effect, however, new metastatic disease or manifestation of the patient's known histiocytosis may be the underlying etiology. Further evaluation with contrast-enhanced MRI is recommended. These results were called by telephone at the time of interpretation on 04/19/2017 at 2:12 pm to Dr. Jola Schmidt , who verbally acknowledged these results. Electronically Signed   By: Corrie Mckusick D.O.   On: 04/19/2017 14:17   Mr Jeri Cos And Wo Contrast  Result Date:  04/19/2017 CLINICAL DATA:  Generalized weakness and anorexia for few days.  Decreased speech. History of Rosai-Dorfman disease, brain tumor resection. EXAM: MRI HEAD WITHOUT AND WITH CONTRAST TECHNIQUE: Multiplanar, multiecho pulse sequences of the brain and surrounding structures were obtained without and with intravenous contrast. CONTRAST:  33m MULTIHANCE GADOBENATE DIMEGLUMINE 529 MG/ML IV SOLN COMPARISON:  MRI of the head September 24, 2015 and CT HEAD April 19, 2017 FINDINGS: INTRACRANIAL CONTENTS: LEFT dural thickening with 2 dural-based masses again noted. Anterior temporal extra-axial solidly enhancing mass was 16 x 24 mm, now 15 x 21 mm. The more posterior mass was 0.7 x 1.2 cm, now 2.5 x 3.3 cm corresponding to CT abnormality. Mass effect on LEFT temporal lobes with severe T2 bright presume vasogenic edema LEFT temporal frontal and parietal lobes including posterior limb of the internal capsule. Regional mass effect with 3 mm new LEFT-to-RIGHT midline shift. LEFT uncal herniation. Partial effacement LEFT lateral ventricle without RIGHT ventricle entrapment or, hydrocephalus. Hemosiderin staining mesial LEFT parietal lobe at site of prior hemorrhage. Numerous scattered chronic micro hemorrhages within supra-and infratentorial brain. Moderate to severe parenchymal brain volume loss. No abnormal extra-axial fluid collections. VASCULAR: Normal major intracranial vascular flow voids present at skull base. SKULL AND UPPER CERVICAL SPINE: No abnormal sellar expansion. Similar heterogeneous calvarial bone marrow signal. Craniocervical junction maintained. Status post suboccipital decompressive craniectomy. SINUSES/ORBITS: The mastoid air-cells and included paranasal sinuses are well-aerated.T2 bright signal and atrophic optic nerves and chiasm consistent with history of blindness. OTHER: None. IMPRESSION: 1. Interval progression of LEFT dural masses, largest component measuring 2.5 x 3.3 cm, previously 0.7 x 1.2 cm.  Severe LEFT cerebral edema, potential parenchymal invasion or infiltrative tumor. Findings attributed to patient's Rosai-Dorfman disease (which has associated perilesional severe edema) less likely meningiomatosis. 2. Regional mass effect with 3 mm LEFT-to-RIGHT midline shift and, LEFT uncal herniation. 3. Mesial LEFT parietal hemosiderin staining and numerous chronic micro hemorrhages. 4. Moderate to severe parenchymal brain volume loss. Electronically Signed   By: CElon AlasM.D.   On: 04/19/2017 21:46    Review of systems not obtained due to patient factors. Blood pressure 118/64, pulse 83, temperature 97.7 F (36.5 C), resp. rate 18, weight 59 kg (130 lb), SpO2 100 %. The patient is awake.  She appears aware.  She is nonverbal.  She does groan and moan to noxious stimuli.  Patient with right sided central facial weakness otherwise cranial nerve function appears intact except she has minimally reactive pupils.  Motor examination she moves all 4 extremities with some weakness on the right side.  Sensory examination she senses pain in all 4 extremities.  Examination of head ears eyes nose and throat is unremarkable her chest and abdomen are essentially benign.  Extremities are free of major deformity.  Assessment/Plan: Enlarging left middle fossa extradural masses x2 with associated vasogenic edema.  Plan admission for medical stabilization and treatment with IV steroids.  Possible surgical resection later this week.  HMallie MusselA Clemons Salvucci 04/20/2017, 10:15 AM

## 2017-04-20 NOTE — Progress Notes (Signed)
Rehab Admissions Coordinator Note:  Patient was screened by Cleatrice Burke for appropriateness for an Inpatient Acute Rehab Consult per PT recommendation   At this time, we are recommending await surgical resection before proceeding with planning rehab venue options.Cleatrice Burke 04/20/2017, 1:15 PM  I can be reached at [phone].

## 2017-04-20 NOTE — ED Notes (Signed)
Report attempted x 1

## 2017-04-20 NOTE — Procedures (Signed)
ELECTROENCEPHALOGRAM REPORT  Date of Study: 04/20/2017  Patient's Name: Rachel [name] MRN: [id number] Date of Birth: 1950-04-18  Referring Provider: Dr. Domenic Polite  Clinical History: This is a 67 year old woman with altered mental status  Medications: Ativan Decadron Insulin Metoprolol Aredia  Technical Summary: A multichannel digital EEG recording measured by the international 10-20 system with electrodes applied with paste and impedances below 5000 ohms performed as portable with EKG monitoring in an awake and drowsy patient.  Hyperventilation and photic stimulation were not performed.  The digital EEG was referentially recorded, reformatted, and digitally filtered in a variety of bipolar and referential montages for optimal display.   Description: The patient is awake and drowsy during the recording. She is confused during the study. During maximal wakefulness, there is a symmetric, medium voltage 6 Hz posterior dominant rhythm that poorly attenuates with eye opening. This is admixed with a moderate amount of diffuse 4-5 Hz theta and 2-3 Hz delta slowing of the waking background. Sleep was not captured. Hyperventilation and photic stimulation were not performed.  There were no epileptiform discharges or electrographic seizures seen.    EKG lead was unremarkable.  Impression: This awake and drowsy EEG is abnormal due to moderate diffuse slowing of the waking background.  Clinical Correlation of the above findings indicates diffuse cerebral dysfunction that is non-specific in etiology and can be seen with hypoxic/ischemic injury, toxic/metabolic encephalopathies, neurodegenerative disorders, or medication effect.  The absence of epileptiform discharges does not rule out a clinical diagnosis of epilepsy.  Clinical correlation is advised.   Ellouise Newer, M.D.

## 2017-04-20 NOTE — ED Notes (Signed)
Hooked patient back up to the monitor patient is resting with call bell in reach 

## 2017-04-20 NOTE — ED Notes (Signed)
Admitting paged regarding neuro check

## 2017-04-21 ENCOUNTER — Ambulatory Visit (HOSPITAL_COMMUNITY): Payer: BLUE CROSS/BLUE SHIELD | Admitting: Psychiatry

## 2017-04-21 LAB — COMPREHENSIVE METABOLIC PANEL
ALT: 15 U/L (ref 14–54)
ANION GAP: 8 (ref 5–15)
AST: 18 U/L (ref 15–41)
Albumin: 3.2 g/dL — ABNORMAL LOW (ref 3.5–5.0)
Alkaline Phosphatase: 54 U/L (ref 38–126)
BUN: 11 mg/dL (ref 6–20)
CHLORIDE: 115 mmol/L — AB (ref 101–111)
CO2: 24 mmol/L (ref 22–32)
CREATININE: 0.61 mg/dL (ref 0.44–1.00)
Calcium: 11.8 mg/dL — ABNORMAL HIGH (ref 8.9–10.3)
GFR calc Af Amer: >60 mL/min (ref >60)
GFR calc non Af Amer: >60 mL/min (ref >60)
Glucose, Bld: 115 mg/dL — ABNORMAL HIGH (ref 65–99)
POTASSIUM: 3.4 mmol/L — AB (ref 3.5–5.1)
SODIUM: 147 mmol/L — AB (ref 135–145)
Total Bilirubin: 0.5 mg/dL (ref 0.3–1.2)
Total Protein: 6.3 g/dL — ABNORMAL LOW (ref 6.5–8.1)

## 2017-04-21 LAB — CBC
HCT: 43.8 % (ref 36.0–46.0)
Hemoglobin: 14.4 g/dL (ref 12.0–15.0)
MCH: 30.2 pg (ref 26.0–34.0)
MCHC: 32.9 g/dL (ref 30.0–36.0)
MCV: 91.8 fL (ref 78.0–100.0)
Platelets: 196 K/uL (ref 150–400)
RBC: 4.77 MIL/uL (ref 3.87–5.11)
RDW: 13.3 % (ref 11.5–15.5)
WBC: 12.5 K/uL — ABNORMAL HIGH (ref 4.0–10.5)

## 2017-04-21 LAB — PARATHYROID HORMONE, INTACT (NO CA): PTH: 37 pg/mL (ref 15–65)

## 2017-04-21 LAB — GLUCOSE, CAPILLARY
GLUCOSE-CAPILLARY: 103 mg/dL — AB (ref 65–99)
GLUCOSE-CAPILLARY: 116 mg/dL — AB (ref 65–99)
Glucose-Capillary: 111 mg/dL — ABNORMAL HIGH (ref 65–99)
Glucose-Capillary: 127 mg/dL — ABNORMAL HIGH (ref 65–99)
Glucose-Capillary: 131 mg/dL — ABNORMAL HIGH (ref 65–99)
Glucose-Capillary: 135 mg/dL — ABNORMAL HIGH (ref 65–99)
Glucose-Capillary: 181 mg/dL — ABNORMAL HIGH (ref 65–99)

## 2017-04-21 MED ORDER — LORAZEPAM 2 MG/ML IJ SOLN
0.2500 mg | Freq: Four times a day (QID) | INTRAMUSCULAR | Status: DC | PRN
  Administered 2017-04-22: 0.5 mg via INTRAVENOUS
  Filled 2017-04-21: qty 1

## 2017-04-21 MED ORDER — SODIUM CHLORIDE 0.9 % IV SOLN
1000.0000 mL | INTRAVENOUS | Status: DC
  Administered 2017-04-22: 1000 mL via INTRAVENOUS

## 2017-04-21 MED ORDER — CEFAZOLIN SODIUM-DEXTROSE 2-4 GM/100ML-% IV SOLN
2.0000 g | INTRAVENOUS | Status: DC
  Filled 2017-04-21: qty 100

## 2017-04-21 MED ORDER — HYDRALAZINE HCL 20 MG/ML IJ SOLN
5.0000 mg | INTRAMUSCULAR | Status: DC | PRN
  Administered 2017-04-23: 5 mg via INTRAVENOUS
  Filled 2017-04-21 (×2): qty 1

## 2017-04-21 NOTE — Progress Notes (Signed)
No significant change in status.  Patient remains somnolent but will awaken to voice.  Moves all 4 extremities with persistent right-sided hemiparesis.  Remains essentially nonverbal.  I discussed situation with her family.  She has evidence of enlarging left-sided middle cranial fossa extradural masses consistent with meningiomas.  I discussed moving forward with left-sided pterional craniotomy and resection of tumors on Friday morning.  I discussed the risks and the benefits with the patient's family.  They have been given the opportunity to ask questions and appeared to understand.  They agree to proceed with surgery.

## 2017-04-21 NOTE — Evaluation (Signed)
Clinical/Bedside Swallow Evaluation Patient Details  Name: Rachel [name] MRN: [id number] Date of Birth: 07-23-1950  Today's Date: 04/21/2017 Time: SLP Start Time (ACUTE ONLY): 0900 SLP Stop Time (ACUTE ONLY): 0938 SLP Time Calculation (min) (ACUTE ONLY): 38 min  Past Medical History:  Past Medical History:  Diagnosis Date  . Blindness   . Diabetes mellitus type I ([place])   . GERD (gastroesophageal reflux disease)   . Hyperlipidemia   . Paroxysmal atrial fibrillation ([facility])   . Schizophrenia ([place])   . Sleep apnea   . Subarachnoid hemorrhage ([place])   . Systemic hypertension   . Vertigo    Past Surgical History:  Past Surgical History:  Procedure Laterality Date  . BRAIN TUMOR EXCISION    . NM MYOCAR PERF WALL MOTION  02/01/2007   no significant ischemia  . US ECHOCARDIOGRAPHY  12/20/2003   mild mitral annular ca+,mild MR,TR,PI,AOV mildly sclerotic   HPI:  67 year old female with history of Rosai-Dorfman's disease as well as diabetes mellitus and schizophrenia presents with worsening right-sided weakness and aphasia.  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  Patient admitted for treatment with IV steroids and possible later surgical resection.  No history of seizure.  Patient has a history of blindness.  She is status post a remote occipital craniectomy and resection of extradural lesion years ago. MRI shows LEFT dural thickening with 2 dural-based masses again noted. Anterior temporal extra-axial solidly enhancing mass was 16 x 24 mm, now 15 x 21 mm. The more posterior mass was 0.7 x 1.2 cm, now 2.5 x 3.3 cm corresponding to CT abnormality. Mass effect on LEFT temporal lobes with severe T2 bright presume vasogenic edema LEFT temporal frontal and parietal lobes including posterior limb of the internal capsule. Regional mass effect with 3 mm new LEFT-to-RIGHT midline shift.  LEFT uncal herniation. Partial effacement LEFT lateral ventricle without RIGHT ventricle entrapment or, hydrocephalus. Hemosiderin staining mesial LEFT parietal lobe at site of prior hemorrhage. Numerous scattered chronic micro hemorrhages within supra-and infratentorial brain. Moderate to severe parenchymal brain volume loss. No abnormal extra-axial fluid collections.   Assessment / Plan / Recommendation Clinical Impression  Pt demonstrates decreased responsiveness and awareness to POs impacting ability to consume nutrition. Pt today does not follow commands, keeps jaw clenched, was found holding thick secretions anteriorally in her mouth. With tactile and verbal cues from SLP and husband pt responded to cup and water to lips and actively consumed 1-2 oz of water without signs of aspiration. She would not open jaw to accept spoon with puree despite similar cueing. Husband reports that typically pt drinks from a straw with assist and can masticate soft solids also with assist. Recommend husband offer sips of water today with precautions provided by SLP. Hopeful that awareness and responsiveness to PO will improve over the course of the day, but pt may need Cortrak if not as she has not had any nutrition since Sunday per husband. Will f/u tomorrow for further trials.  SLP Visit Diagnosis: Dysphagia, unspecified (R13.10)    Aspiration Risk  Moderate aspiration risk    Diet Recommendation Other (Comment);Thin liquid(sips of water with husband)   Medication Administration: Via alternative means Supervision: Full supervision/cueing for compensatory strategies Compensations: Slow rate;Small sips/bites;Monitor for anterior loss Postural Changes: Seated upright at 90 degrees;Remain upright for at least 30 minutes after po intake    Other  Recommendations Oral Care Recommendations:  Oral care BID   Follow up Recommendations 24 hour supervision/assistance      Frequency and Duration min 2x/week  2  weeks       Prognosis Prognosis for Safe Diet Advancement: Good Barriers to Reach Goals: Cognitive deficits      Swallow Study   General HPI: 67 year old female with history of Rosai-Dorfman's disease as well as diabetes mellitus and schizophrenia presents with worsening right-sided weakness and aphasia.  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  Patient admitted for treatment with IV steroids and possible later surgical resection.  No history of seizure.  Patient has a history of blindness.  She is status post a remote occipital craniectomy and resection of extradural lesion years ago. MRI shows LEFT dural thickening with 2 dural-based masses again noted. Anterior temporal extra-axial solidly enhancing mass was 16 x 24 mm, now 15 x 21 mm. The more posterior mass was 0.7 x 1.2 cm, now 2.5 x 3.3 cm corresponding to CT abnormality. Mass effect on LEFT temporal lobes with severe T2 bright presume vasogenic edema LEFT temporal frontal and parietal lobes including posterior limb of the internal capsule. Regional mass effect with 3 mm new LEFT-to-RIGHT midline shift. LEFT uncal herniation. Partial effacement LEFT lateral ventricle without RIGHT ventricle entrapment or, hydrocephalus. Hemosiderin staining mesial LEFT parietal lobe at site of prior hemorrhage. Numerous scattered chronic micro hemorrhages within supra-and infratentorial brain. Moderate to severe parenchymal brain volume loss. No abnormal extra-axial fluid collections. Type of Study: Bedside Swallow Evaluation Previous Swallow Assessment: none Diet Prior to this Study: NPO Temperature Spikes Noted: Yes Respiratory Status: Room air History of Recent Intubation: No Behavior/Cognition: Lethargic/Drowsy;Doesn't follow directions Oral Cavity Assessment: Excessive secretions Oral Care Completed by SLP: Yes Oral Cavity - Dentition:  Adequate natural dentition Vision: Impaired for self-feeding Self-Feeding Abilities: Total assist Patient Positioning: Upright in bed Baseline Vocal Quality: Not observed Volitional Cough: Cognitively unable to elicit Volitional Swallow: Unable to elicit    Oral/Motor/Sensory Function Overall Oral Motor/Sensory Function: Other (comment)(does not follow commands, suspect adequate strength)   Ice Chips Ice chips: Not tested   Thin Liquid Thin Liquid: Impaired Presentation: Cup;Straw Oral Phase Impairments: Reduced labial seal Oral Phase Functional Implications: Oral holding;Right anterior spillage;Left anterior spillage    Nectar Thick Nectar Thick Liquid: Not tested   Honey Thick Honey Thick Liquid: Not tested   Puree Puree: Impaired Presentation: Spoon Oral Phase Impairments: Poor awareness of bolus;Reduced labial seal Oral Phase Functional Implications: Oral holding   Solid   GO   Solid: Not tested       Herbie Baltimore, MA CCC-SLP [phone]  Lynann Beaver 04/21/2017,9:44 AM

## 2017-04-21 NOTE — Progress Notes (Signed)
Patient ID: Rachel [name], female   DOB: 09/07/1950, 67 y.o.   MRN: [id number]  PROGRESS NOTE    Rachel [name]  RWE:[id number] DOB: [birth year] DOA: 04/19/2017 PCP: Iona Beard, MD   Brief Narrative:  67 year old female with history of Rosai Dorfman's disease with remote occipital craniectomy in 2008 and resection of extradural lesion, paroxysmal atrial fibrillation, hypertension, hyperlipidemia and diabetes mellitus type 2 on insulin, schizophrenia, blindness, OSA presented on 04/19/2017 with worsening right-sided weakness, facial droop, poor appetite, inability to eat and ongoing failure to thrive.  She was found to have enlarging left dural masses with cerebral edema.  Neurosurgery was consulted.  Assessment & Plan:   Active Problems:   HTN (hypertension)   PAF (paroxysmal atrial fibrillation) ([facility])   Rosai-Dorfman disease ([facility])   Diabetes mellitus type 2 in nonobese ([facility])   Acute encephalopathy   Hypokalemia   Brain edema ([facility])   Neoplasm of brain causing mass effect on adjacent structures ([facility])   Hypercalcemia   1. Enlarging LEFT dural masses with cerebral edema -per MRI: Findings attributed to patient's Rosai-Dorfman disease -continue IV Decadron -neurosurgery following: plan for surgical resection later this week when stable  2. Acute encephalopathy -Secondary to enlarging left dural masses and severe cerebral edema -Continue IV Decadron  -Monitor mental status.  Diet as per SLP recommendations -Patient is still very drowsy; will change Ativan from scheduled dosing to as needed dosing -Fall precautions  3. Paroxysmal atrial fibrillation -Currently in sinus rhythm and rate controlled -Unable to take by mouth Cardizem, will give IV metoprolol while NPO -She is not a candidate for anticoagulation at this time  4. Anxiety/depression/schizophrenia -patient takes Xanax, temazepam, trazodone, risperidone and Paxil at home -Change IV Ativan to as needed -Resume  Risperdal and Paxil once more awake  5. Diabetes mellitus type II -Continue Lantus.  Continue Accu-Cheks with coverage  6. Hypercalcemia -Likely related to dehydration and underlying histiocytosis/Rosai Dorfman -Was given 1 dose of IV pamidronate on 04/20/2017. -Continue IV fluids at a lower rate.  Repeat a.m. Labs  7.  Hypertension -Blood pressure on the higher side.  Continue metoprolol.  We will add hydralazine as needed  DVT prophylaxis: SCDs Code Status: Full Code Family Communication: Spouse at bedside Disposition Plan:  Depends on clinical outcome   Consultants: Neurosurgery  Procedures: None  Antimicrobials: None  Subjective: Patient seen and examined at bedside.  She is very sleepy, hardly wakes up on calling her name.  No overnight fever or vomiting.  Objective: Vitals:   04/21/17 0519 04/21/17 0520 04/21/17 0745 04/21/17 1209  BP: (!) 170/93  (!) 184/93   Pulse: 82 73 61   Resp: 15 17 15    Temp:   98.9 F (37.2 C) 99.1 F (37.3 C)  TempSrc:   Axillary Axillary  SpO2: 98% 96% 97%   Weight:        Intake/Output Summary (Last 24 hours) at 04/21/2017 1357 Last data filed at 04/21/2017 0900 Gross per 24 hour  Intake 1668.33 ml  Output 450 ml  Net 1218.33 ml   Filed Weights   04/19/17 1236  Weight: 59 kg (130 lb)    Examination:  General exam: Very sleepy, hardly wakes up Respiratory system: Bilateral decreased breath sound at bases Cardiovascular system: S1 & S2 heard, rate controlled gastrointestinal system: Abdomen is nondistended, soft and nontender. Normal bowel sounds heard. Central nervous system: Very sleepy and drowsy.  No obvious focal neurologic deficits  extremities: No cyanosis, clubbing, edema  Skin: No rashes  or ulcers Psychiatry: Could not be assessed because of mental status    Data Reviewed: I have personally reviewed following labs and imaging studies  CBC: Recent Labs  Lab 04/19/17 1237 04/19/17 1257 04/20/17 0457  04/21/17 0359  WBC 8.3  --  6.6 12.5*  NEUTROABS 4.6  --   --   --   HGB 14.3 14.3 12.7 14.4  HCT 42.4 42.0 38.7 43.8  MCV 92.6  --  92.1 91.8  PLT 197  --  182 166   Basic Metabolic Panel: Recent Labs  Lab 04/19/17 1237 04/19/17 1257 04/20/17 0457 04/21/17 0359  NA 144 145 144 147*  K 2.9* 3.1* 3.7 3.4*  CL 106 102 109 115*  CO2 29  --  27 24  GLUCOSE 142* 141* 181* 115*  BUN <5* 5* 10 11  CREATININE 0.68 0.60 0.70 0.61  CALCIUM 11.9*  --  11.3* 11.8*  MG 2.1  --  2.0  --   PHOS 3.0  --  3.0  --    GFR: Estimated Creatinine Clearance: 66.2 mL/min (by C-G formula based on SCr of 0.61 mg/dL). Liver Function Tests: Recent Labs  Lab 04/19/17 1237 04/20/17 0457 04/21/17 0359  AST 19 14* 18  ALT 13* 13* 15  ALKPHOS 60 52 54  BILITOT 0.7 0.7 0.5  PROT 6.6 6.1* 6.3*  ALBUMIN 3.5 3.0* 3.2*   No results for input(s): LIPASE, AMYLASE in the last 168 hours. No results for input(s): AMMONIA in the last 168 hours. Coagulation Profile: Recent Labs  Lab 04/19/17 1237  INR 1.01   Cardiac Enzymes: No results for input(s): CKTOTAL, CKMB, CKMBINDEX, TROPONINI in the last 168 hours. BNP (last 3 results) No results for input(s): PROBNP in the last 8760 hours. HbA1C: Recent Labs    04/20/17 0457  HGBA1C 7.4*   CBG: Recent Labs  Lab 04/20/17 1951 04/21/17 0026 04/21/17 0414 04/21/17 0750 04/21/17 1210  GLUCAP 170* 135* 111* 103* 127*   Lipid Profile: No results for input(s): CHOL, HDL, LDLCALC, TRIG, CHOLHDL, LDLDIRECT in the last 72 hours. Thyroid Function Tests: Recent Labs    04/20/17 0458  TSH 0.421   Anemia Panel: No results for input(s): VITAMINB12, FOLATE, FERRITIN, TIBC, IRON, RETICCTPCT in the last 72 hours. Sepsis Labs: No results for input(s): PROCALCITON, LATICACIDVEN in the last 168 hours.  Recent Results (from the past 240 hour(s))  MRSA PCR Screening     Status: None   Collection Time: 04/20/17 11:31 AM  Result Value Ref Range Status    MRSA by PCR NEGATIVE NEGATIVE Final    Comment:        The GeneXpert MRSA Assay (FDA approved for NASAL specimens only), is one component of a comprehensive MRSA colonization surveillance program. It is not intended to diagnose MRSA infection nor to guide or monitor treatment for MRSA infections. Performed at [facility], [place] [address] [place], [place] [address]          Radiology Studies: Mr Jeri Cos And Wo Contrast  Result Date: 04/19/2017 CLINICAL DATA:  Generalized weakness and anorexia for few days. Decreased speech. History of Rosai-Dorfman disease, brain tumor resection. EXAM: MRI HEAD WITHOUT AND WITH CONTRAST TECHNIQUE: Multiplanar, multiecho pulse sequences of the brain and surrounding structures were obtained without and with intravenous contrast. CONTRAST:  67mL MULTIHANCE GADOBENATE DIMEGLUMINE 529 MG/ML IV SOLN COMPARISON:  MRI of the head September 24, 2015 and CT HEAD April 19, 2017 FINDINGS: INTRACRANIAL CONTENTS: LEFT dural thickening with 2  dural-based masses again noted. Anterior temporal extra-axial solidly enhancing mass was 16 x 24 mm, now 15 x 21 mm. The more posterior mass was 0.7 x 1.2 cm, now 2.5 x 3.3 cm corresponding to CT abnormality. Mass effect on LEFT temporal lobes with severe T2 bright presume vasogenic edema LEFT temporal frontal and parietal lobes including posterior limb of the internal capsule. Regional mass effect with 3 mm new LEFT-to-RIGHT midline shift. LEFT uncal herniation. Partial effacement LEFT lateral ventricle without RIGHT ventricle entrapment or, hydrocephalus. Hemosiderin staining mesial LEFT parietal lobe at site of prior hemorrhage. Numerous scattered chronic micro hemorrhages within supra-and infratentorial brain. Moderate to severe parenchymal brain volume loss. No abnormal extra-axial fluid collections. VASCULAR: Normal major intracranial vascular flow voids present at skull base. SKULL AND UPPER CERVICAL SPINE: No abnormal sellar  expansion. Similar heterogeneous calvarial bone marrow signal. Craniocervical junction maintained. Status post suboccipital decompressive craniectomy. SINUSES/ORBITS: The mastoid air-cells and included paranasal sinuses are well-aerated.T2 bright signal and atrophic optic nerves and chiasm consistent with history of blindness. OTHER: None. IMPRESSION: 1. Interval progression of LEFT dural masses, largest component measuring 2.5 x 3.3 cm, previously 0.7 x 1.2 cm. Severe LEFT cerebral edema, potential parenchymal invasion or infiltrative tumor. Findings attributed to patient's Rosai-Dorfman disease (which has associated perilesional severe edema) less likely meningiomatosis. 2. Regional mass effect with 3 mm LEFT-to-RIGHT midline shift and, LEFT uncal herniation. 3. Mesial LEFT parietal hemosiderin staining and numerous chronic micro hemorrhages. 4. Moderate to severe parenchymal brain volume loss. Electronically Signed   By: Elon Alas M.D.   On: 04/19/2017 21:46        Scheduled Meds: . dexamethasone  10 mg Intravenous Q8H  . insulin aspart  0-9 Units Subcutaneous Q4H  . insulin glargine  15 Units Subcutaneous QHS  . LORazepam  0.5 mg Intravenous Q6H  . mouth rinse  15 mL Mouth Rinse BID  . metoprolol tartrate  5 mg Intravenous Q6H  . pantoprazole (PROTONIX) IV  40 mg Intravenous Q24H   Continuous Infusions: . sodium chloride       LOS: 2 days        Aline August, MD [facility] Pager [phone]  If 7PM-7AM, please contact night-coverage [email or web address] Password [facility] 04/21/2017, 1:57 PM

## 2017-04-22 LAB — COMPREHENSIVE METABOLIC PANEL
ALBUMIN: 2.9 g/dL — AB (ref 3.5–5.0)
ALT: 47 U/L (ref 14–54)
ANION GAP: 8 (ref 5–15)
AST: 54 U/L — ABNORMAL HIGH (ref 15–41)
Alkaline Phosphatase: 49 U/L (ref 38–126)
BILIRUBIN TOTAL: 0.7 mg/dL (ref 0.3–1.2)
BUN: 14 mg/dL (ref 6–20)
CHLORIDE: 110 mmol/L (ref 101–111)
CO2: 28 mmol/L (ref 22–32)
Calcium: 10.8 mg/dL — ABNORMAL HIGH (ref 8.9–10.3)
Creatinine, Ser: 0.57 mg/dL (ref 0.44–1.00)
GFR calc Af Amer: >60 mL/min (ref >60)
Glucose, Bld: 124 mg/dL — ABNORMAL HIGH (ref 65–99)
POTASSIUM: 3.1 mmol/L — AB (ref 3.5–5.1)
Sodium: 146 mmol/L — ABNORMAL HIGH (ref 135–145)
TOTAL PROTEIN: 5.5 g/dL — AB (ref 6.5–8.1)

## 2017-04-22 LAB — CBC WITH DIFFERENTIAL/PLATELET
BASOS ABS: 0 10*3/uL (ref 0.0–0.1)
BASOS PCT: 0 %
EOS PCT: 0 %
Eosinophils Absolute: 0 10*3/uL (ref 0.0–0.7)
HEMATOCRIT: 42.9 % (ref 36.0–46.0)
Hemoglobin: 14.4 g/dL (ref 12.0–15.0)
Lymphocytes Relative: 12 %
Lymphs Abs: 1.4 10*3/uL (ref 0.7–4.0)
MCH: 30.4 pg (ref 26.0–34.0)
MCHC: 33.6 g/dL (ref 30.0–36.0)
MCV: 90.5 fL (ref 78.0–100.0)
MONO ABS: 0.7 10*3/uL (ref 0.1–1.0)
MONOS PCT: 6 %
NEUTROS ABS: 9.5 10*3/uL — AB (ref 1.7–7.7)
Neutrophils Relative %: 82 %
PLATELETS: 158 10*3/uL (ref 150–400)
RBC: 4.74 MIL/uL (ref 3.87–5.11)
RDW: 12.8 % (ref 11.5–15.5)
WBC: 11.7 10*3/uL — ABNORMAL HIGH (ref 4.0–10.5)

## 2017-04-22 LAB — GLUCOSE, CAPILLARY
GLUCOSE-CAPILLARY: 105 mg/dL — AB (ref 65–99)
Glucose-Capillary: 102 mg/dL — ABNORMAL HIGH (ref 65–99)
Glucose-Capillary: 113 mg/dL — ABNORMAL HIGH (ref 65–99)
Glucose-Capillary: 113 mg/dL — ABNORMAL HIGH (ref 65–99)
Glucose-Capillary: 119 mg/dL — ABNORMAL HIGH (ref 65–99)
Glucose-Capillary: 91 mg/dL (ref 65–99)

## 2017-04-22 LAB — MAGNESIUM: Magnesium: 2.1 mg/dL (ref 1.7–2.4)

## 2017-04-22 MED ORDER — ORAL CARE MOUTH RINSE
15.0000 mL | Freq: Two times a day (BID) | OROMUCOSAL | Status: DC
  Administered 2017-04-22 – 2017-04-23 (×2): 15 mL via OROMUCOSAL

## 2017-04-22 MED ORDER — POTASSIUM CHLORIDE 10 MEQ/100ML IV SOLN
10.0000 meq | INTRAVENOUS | Status: AC
  Administered 2017-04-22 (×4): 10 meq via INTRAVENOUS
  Filled 2017-04-22 (×4): qty 100

## 2017-04-22 MED ORDER — SODIUM CHLORIDE 0.45 % IV SOLN
INTRAVENOUS | Status: DC
  Administered 2017-04-22 – 2017-04-24 (×3): via INTRAVENOUS

## 2017-04-22 MED ORDER — CHLORHEXIDINE GLUCONATE 0.12 % MT SOLN
15.0000 mL | Freq: Two times a day (BID) | OROMUCOSAL | Status: DC
  Administered 2017-04-22 – 2017-04-23 (×3): 15 mL via OROMUCOSAL
  Filled 2017-04-22 (×2): qty 15

## 2017-04-22 NOTE — Progress Notes (Signed)
Physical Therapy Treatment Patient Details Name: Rachel [name] MRN: [id number] DOB: [birth year] Today's Date: 04/22/2017    History of Present Illness 67 yo female with attentive husband was admitted with worsening R side weakness and aphasia, now has increased L sphenoid wing and L lateral middle fossa dural lesions increasing in size and brain edema.  On steroids and being considered for surgery.  PMHx:  blindness, DM, HLD, PAF, schizophrenia, subarachnoid hemorrhage, HTN, vertigo, tumor excision 02/01/07    PT Comments    Pt not able to voluntarily participate in the treatment, though spontaneously elicited responses during ROM exercise, transition to sit, sitting balance and the reactions to moving pt outside of BOS and standing.    Follow Up Recommendations  CIR     Equipment Recommendations  None recommended by PT    Recommendations for Other Services       Precautions / Restrictions Precautions Precautions: Fall    Mobility  Bed Mobility Overal bed mobility: Needs Assistance Bed Mobility: Rolling;Sidelying to Sit;Sit to Supine Rolling: Total assist Sidelying to sit: Total assist   Sit to supine: Total assist;+2 for physical assistance   General bed mobility comments: no initiation, but tone is increased in general, so pt is relatively easy to mobilize without voluntary assist from the pt.  Transfers Overall transfer level: Needs assistance Equipment used: 1 person hand held assist Transfers: Sit to/from Stand Sit to Stand: Max assist         General transfer comment: pt assisted automatically to stand and maintained stand with light moderate assist.  Pt with increased tone in general  Ambulation/Gait                 Stairs            Wheelchair Mobility    Modified Rankin (Stroke Patients Only) Modified Rankin (Stroke Patients Only) Modified Rankin: Severe disability     Balance Overall balance assessment: Needs assistance    Sitting balance-Leahy Scale: Poor Sitting balance - Comments: pt can maintain sitting for short periods, but will eventually list R.   Pt spontaneously attempts minimally to correct toward midline and with increase tone does not show a significant loss of balance                                    Cognition Arousal/Alertness: Awake/alert Behavior During Therapy: Flat affect Overall Cognitive Status: History of cognitive impairments - at baseline                                 General Comments: pt did not make any purposeful/directed responses 90% of time      Exercises      General Comments General comments (skin integrity, edema, etc.): pt with blank stare, no noticeable voluntary movement, moderately high tone.  Not purposeful responses.      Pertinent Vitals/Pain Pain Assessment: Faces Pain Score: 0-No pain Faces Pain Scale: No hurt    Home Living                      Prior Function            PT Goals (current goals can now be found in the care plan section) Acute Rehab PT Goals Patient Stated Goal: none stated, nearly nonverbal PT Goal Formulation: With family Time For Goal  Achievement: 05/04/17 Potential to Achieve Goals: Fair Progress towards PT goals: Progressing toward goals    Frequency    Min 3X/week      PT Plan Current plan remains appropriate    Co-evaluation              AM-PAC PT "6 Clicks" Daily Activity  Outcome Measure  Difficulty turning over in bed (including adjusting bedclothes, sheets and blankets)?: Unable Difficulty moving from lying on back to sitting on the side of the bed? : Unable Difficulty sitting down on and standing up from a chair with arms (e.g., wheelchair, bedside commode, etc,.)?: Unable Help needed moving to and from a bed to chair (including a wheelchair)?: Total Help needed walking in hospital room?: Total Help needed climbing 3-5 steps with a railing? : Total 6 Click  Score: 6    End of Session   Activity Tolerance: Patient limited by lethargy Patient left: in bed;with call bell/phone within reach;with family/visitor present Nurse Communication: Mobility status;Other (comment) PT Visit Diagnosis: Muscle weakness (generalized) (M62.81);Other abnormalities of gait and mobility (R26.89);Adult, failure to thrive (R62.7);Hemiplegia and hemiparesis;Other symptoms and signs involving the nervous system (R29.898) Hemiplegia - Right/Left: Right Hemiplegia - dominant/non-dominant: Dominant Hemiplegia - caused by: Nontraumatic SAH;Cerebral infarction     Time: 0383-3383 PT Time Calculation (min) (ACUTE ONLY): 27 min  Charges:  $Therapeutic Activity: 23-37 mins                    G Codes:       04-29-17  Donnella Sham, PT [phone]  (pager)   Tessie Fass Mercie Balsley April 29, 2017, 1:15 PM

## 2017-04-22 NOTE — Care Management Note (Signed)
Case Management Note  Patient Details  Name: Rachel [name] MRN: [id number] Date of Birth: 06/08/1950  Subjective/Objective: 67 yo female admitted with worsening R side weakness and aphasia, now has increased L sphenoid wing and L lateral middle fossa dural lesions increasing in size and brain edema.  PTA, pt resided at home with husband, who provides assistance with ADLs.                   Action/Plan: PT recommending CIR; plan holding on consult, as pt may need surgery.  Will follow progress.   Expected Discharge Date:                  Expected Discharge Plan:  [place]  In-House Referral:     Discharge planning Services  CM Consult  Post Acute Care Choice:    Choice offered to:     DME Arranged:    DME Agency:     HH Arranged:    [place] Agency:     Status of Service:  In process, will continue to follow  If discussed at Long Length of Stay Meetings, dates discussed:    Additional Comments:  04/22/17 J. Johnpatrick Jenny, RN, BSN Neurosurgery planning left pterional craniotomy and resection of tumors on Friday, 3/15.  Will follow post op for discharge planning.    Reinaldo Raddle, RN, BSN  Trauma/Neuro ICU Case Manager [phone]

## 2017-04-22 NOTE — Progress Notes (Signed)
Patient ID: Rachel [name], female   DOB: 1950/07/27, 67 y.o.   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  She was found to have enlarging left dural masses with cerebral edema.  Neurosurgery was consulted.  Assessment & Plan:   Active Problems:   HTN (hypertension)   PAF (paroxysmal atrial fibrillation) (HCC)   Rosai-Dorfman disease (HCC)   Diabetes mellitus type 2 in nonobese (HCC)   Acute encephalopathy   Hypokalemia   Brain edema (HCC)   Neoplasm of brain causing mass effect on adjacent structures (HCC)   Hypercalcemia   1. Enlarging LEFT dural masses with cerebral edema -per MRI: Findings attributed to patient's Rosai-Dorfman disease -continue IV Decadron -neurosurgery following: plan for surgical resection probably tomorrow  2. Acute encephalopathy -Secondary to enlarging left dural masses and severe cerebral edema -Continue IV Decadron  -Monitor mental status.  Diet as per SLP recommendations -Still very drowsy. -Fall precautions  3. Paroxysmal atrial fibrillation -Currently in sinus bradycardia -Unable to take by mouth Cardizem, will give IV metoprolol while NPO with holding parameters -She is not a candidate for anticoagulation at this time  4. Anxiety/depression/schizophrenia -patient takes Xanax, temazepam, trazodone, risperidone and Paxil at home -Continue Ativan as needed -Resume Risperdal and Paxil once more awake  5. Diabetes mellitus type  II -Continue Lantus.  Continue Accu-Cheks with coverage  6. Hypercalcemia -Likely related to dehydration and underlying histiocytosis/Rosai Dorfman -Was given 1 dose of IV pamidronate on 04/20/2017. -Change IV fluids to half-normal saline at 50 cc an hour  7.  Hypertension -Blood pressure on the higher side.  Continue metoprolol.  Continue hydralazine as needed  8.  Hypokalemia -Replace.  Repeat a.m. Labs  DVT prophylaxis: SCDs Code Status: Full Code Family Communication:  None at bedside Disposition Plan:  Depends on clinical outcome   Consultants: Neurosurgery  Procedures: None  Antimicrobials: None  Subjective: Patient seen and examined at bedside.  She is very sleepy and drowsy.  No overnight fever or vomiting  Objective: Vitals:   04/22/17 0452 04/22/17 0600 04/22/17 0800 04/22/17 0822  BP: (!) 163/64  (!) 162/67   Pulse: (!) 49 (!) 46 (!) 44   Resp: 14 15 11    Temp: 98.1 F (36.7 C)   98.7 F (37.1 C)  TempSrc: Axillary   Axillary  SpO2: 98% 98% 99%   Weight:        Intake/Output Summary (Last 24 hours) at 04/22/2017 1028 Last data filed at 04/22/2017 0641 Gross per 24 hour  Intake 799.17 ml  Output 500 ml  Net 299.17 ml   Filed Weights   04/19/17 1236  Weight: 59 kg (130 lb)    Examination:  General exam: Very drowsy. Respiratory system: Bilateral decreased breath sound at bases Cardiovascular system: S1 & S2 heard, bradycardic gastrointestinal system: Abdomen is nondistended, soft and nontender. Normal bowel sounds heard. Central nervous system: Very sleepy and drowsy.  No obvious focal neurologic deficits  extremities: No cyanosis, clubbing, edema     Data Reviewed: I have personally reviewed following labs and imaging studies  CBC: Recent Labs  Lab 04/19/17 1237 04/19/17 1257 04/20/17 0457 04/21/17 0359 04/22/17 0340  WBC 8.3  --  6.6 12.5* 11.7*  NEUTROABS 4.6  --   --   --  9.5*  HGB 14.3 14.3 12.7 14.4 14.4  HCT 42.4 42.0 38.7  43.8 42.9  MCV 92.6  --  92.1 91.8 90.5  PLT 197  --  182 196 789   Basic Metabolic Panel: Recent Labs  Lab 04/19/17 1237 04/19/17 1257 04/20/17 0457 04/21/17 0359 04/22/17 0340  NA 144 145 144 147* 146*  K 2.9* 3.1* 3.7 3.4* 3.1*  CL 106 102 109 115* 110  CO2 29  --  27 24 28   GLUCOSE 142* 141* 181* 115* 124*  BUN <5* 5* 10 11 14   CREATININE 0.68 0.60 0.70 0.61 0.57  CALCIUM 11.9*  --  11.3* 11.8* 10.8*  MG 2.1  --  2.0  --  2.1  PHOS 3.0  --  3.0  --   --    GFR: Estimated Creatinine Clearance: 66.2 mL/min (by C-G formula based on SCr of 0.57 mg/dL). Liver Function Tests: Recent Labs  Lab 04/19/17 1237 04/20/17 0457 04/21/17 0359 04/22/17 0340  AST 19 14* 18 54*  ALT 13* 13* 15 47  ALKPHOS 60 52 54 49  BILITOT 0.7 0.7 0.5 0.7  PROT 6.6 6.1* 6.3* 5.5*  ALBUMIN 3.5 3.0* 3.2* 2.9*   No results for input(s): LIPASE, AMYLASE in the last 168 hours. No results for input(s): AMMONIA in the last 168 hours. Coagulation Profile: Recent Labs  Lab 04/19/17 1237  INR 1.01   Cardiac Enzymes: No results for input(s): CKTOTAL, CKMB, CKMBINDEX, TROPONINI in the last 168 hours. BNP (last 3 results) No results for input(s): PROBNP in the last 8760 hours. HbA1C: Recent Labs    04/20/17 0457  HGBA1C 7.4*   CBG: Recent Labs  Lab 04/21/17 1620 04/21/17 1944 04/22/17 0039 04/22/17 0451 04/22/17 0821  GLUCAP 116* 131* 119* 113* 105*   Lipid Profile: No results for input(s): CHOL, HDL, LDLCALC, TRIG, CHOLHDL, LDLDIRECT in the last 72 hours. Thyroid Function Tests: Recent Labs    04/20/17 0458  TSH 0.421   Anemia Panel: No results for input(s): VITAMINB12, FOLATE, FERRITIN, TIBC, IRON, RETICCTPCT in the last 72 hours. Sepsis Labs: No results for input(s): PROCALCITON, LATICACIDVEN in the last 168 hours.  Recent Results (from the past 240 hour(s))  MRSA PCR Screening     Status: None   Collection Time: 04/20/17 11:31 AM  Result Value Ref Range Status   MRSA by  PCR NEGATIVE NEGATIVE Final    Comment:        The GeneXpert MRSA Assay (FDA approved for NASAL specimens only), is one component of a comprehensive MRSA colonization surveillance program. It is not intended to diagnose MRSA infection nor to guide or monitor treatment for MRSA infections. Performed at [facility], [place] [address] [place], [place] [address]          Radiology Studies: No results found.      Scheduled Meds: . chlorhexidine  15 mL Mouth Rinse BID  . dexamethasone  10 mg Intravenous Q8H  . insulin aspart  0-9 Units Subcutaneous Q4H  . insulin glargine  15 Units Subcutaneous QHS  . mouth rinse  15 mL Mouth Rinse q12n4p  . metoprolol tartrate  5 mg Intravenous Q6H  . pantoprazole (PROTONIX) IV  40 mg Intravenous Q24H   Continuous Infusions: . sodium chloride 1,000 mL (04/22/17 0851)  . [START  ON 04/23/2017]  ceFAZolin (ANCEF) IV    . potassium chloride 10 mEq (04/22/17 0845)     LOS: 3 days        Aline August, MD [facility] Pager [phone]  If 7PM-7AM, please contact night-coverage [email or web address] Password [facility] 04/22/2017, 10:28 AM

## 2017-04-22 NOTE — Progress Notes (Signed)
Overall stable.  Was able to say a few words today.  Right-sided hemiparesis unchanged.  Plan craniotomy and resection of tumors tomorrow morning.

## 2017-04-23 ENCOUNTER — Inpatient Hospital Stay (HOSPITAL_COMMUNITY): Payer: BLUE CROSS/BLUE SHIELD | Admitting: Certified Registered"

## 2017-04-23 ENCOUNTER — Inpatient Hospital Stay (HOSPITAL_COMMUNITY): Payer: BLUE CROSS/BLUE SHIELD

## 2017-04-23 ENCOUNTER — Inpatient Hospital Stay (HOSPITAL_COMMUNITY)
Admission: EM | Disposition: A | Discharge status: Home Health | Payer: Self-pay | Source: Home / Self Care | Attending: Pulmonary Disease

## 2017-04-23 DIAGNOSIS — G936 Cerebral edema: Secondary | ICD-10-CM

## 2017-04-23 DIAGNOSIS — J988 Other specified respiratory disorders: Secondary | ICD-10-CM

## 2017-04-23 DIAGNOSIS — L8990 Pressure ulcer of unspecified site, unspecified stage: Secondary | ICD-10-CM

## 2017-04-23 DIAGNOSIS — D763 Other histiocytosis syndromes: Secondary | ICD-10-CM

## 2017-04-23 DIAGNOSIS — I480 Paroxysmal atrial fibrillation: Secondary | ICD-10-CM

## 2017-04-23 DIAGNOSIS — E43 Unspecified severe protein-calorie malnutrition: Secondary | ICD-10-CM

## 2017-04-23 DIAGNOSIS — E119 Type 2 diabetes mellitus without complications: Secondary | ICD-10-CM

## 2017-04-23 HISTORY — PX: CRANIOTOMY: SHX93

## 2017-04-23 LAB — POCT I-STAT 3, ART BLOOD GAS (G3+)
Acid-Base Excess: 2 mmol/L (ref 0.0–2.0)
Bicarbonate: 25.4 mmol/L (ref 20.0–28.0)
O2 SAT: 100 %
PCO2 ART: 35 mmHg (ref 32.0–48.0)
PH ART: 7.468 — AB (ref 7.350–7.450)
PO2 ART: 479 mmHg — AB (ref 83.0–108.0)
Patient temperature: 98.3
TCO2: 26 mmol/L (ref 22–32)

## 2017-04-23 LAB — CBC WITH DIFFERENTIAL/PLATELET
BASOS ABS: 0 10*3/uL (ref 0.0–0.1)
BASOS PCT: 0 %
EOS ABS: 0 10*3/uL (ref 0.0–0.7)
EOS PCT: 0 %
HCT: 41.7 % (ref 36.0–46.0)
Hemoglobin: 14 g/dL (ref 12.0–15.0)
Lymphocytes Relative: 13 %
Lymphs Abs: 1.2 10*3/uL (ref 0.7–4.0)
MCH: 30.1 pg (ref 26.0–34.0)
MCHC: 33.6 g/dL (ref 30.0–36.0)
MCV: 89.7 fL (ref 78.0–100.0)
MONO ABS: 0.7 10*3/uL (ref 0.1–1.0)
Monocytes Relative: 7 %
Neutro Abs: 7.2 10*3/uL (ref 1.7–7.7)
Neutrophils Relative %: 80 %
PLATELETS: 145 10*3/uL — AB (ref 150–400)
RBC: 4.65 MIL/uL (ref 3.87–5.11)
RDW: 12.8 % (ref 11.5–15.5)
WBC: 9 10*3/uL (ref 4.0–10.5)

## 2017-04-23 LAB — PHOSPHORUS: Phosphorus: 2 mg/dL — ABNORMAL LOW (ref 2.5–4.6)

## 2017-04-23 LAB — BASIC METABOLIC PANEL
ANION GAP: 6 (ref 5–15)
BUN: 12 mg/dL (ref 6–20)
CALCIUM: 10 mg/dL (ref 8.9–10.3)
CO2: 28 mmol/L (ref 22–32)
CREATININE: 0.6 mg/dL (ref 0.44–1.00)
Chloride: 109 mmol/L (ref 101–111)
Glucose, Bld: 93 mg/dL (ref 65–99)
Potassium: 3.1 mmol/L — ABNORMAL LOW (ref 3.5–5.1)
Sodium: 143 mmol/L (ref 135–145)

## 2017-04-23 LAB — ABO/RH: ABO/RH(D): B POS

## 2017-04-23 LAB — GLUCOSE, CAPILLARY
GLUCOSE-CAPILLARY: 106 mg/dL — AB (ref 65–99)
GLUCOSE-CAPILLARY: 158 mg/dL — AB (ref 65–99)
GLUCOSE-CAPILLARY: 159 mg/dL — AB (ref 65–99)
GLUCOSE-CAPILLARY: 80 mg/dL (ref 65–99)
GLUCOSE-CAPILLARY: 82 mg/dL (ref 65–99)
GLUCOSE-CAPILLARY: 87 mg/dL (ref 65–99)

## 2017-04-23 LAB — MAGNESIUM: Magnesium: 2 mg/dL (ref 1.7–2.4)

## 2017-04-23 LAB — TYPE AND SCREEN
ABO/RH(D): B POS
Antibody Screen: NEGATIVE

## 2017-04-23 SURGERY — CRANIOTOMY TUMOR EXCISION
Anesthesia: General | Site: Head | Laterality: Left

## 2017-04-23 MED ORDER — MIDAZOLAM HCL 2 MG/2ML IJ SOLN: 2.0000 mg | INTRAMUSCULAR | Status: DC | PRN

## 2017-04-23 MED ORDER — ADULT MULTIVITAMIN LIQUID CH
15.0000 mL | Freq: Every day | ORAL | Status: DC
  Administered 2017-04-24 – 2017-05-11 (×18): 15 mL
  Filled 2017-04-23 (×18): qty 15

## 2017-04-23 MED ORDER — CHLORHEXIDINE GLUCONATE 0.12% ORAL RINSE (MEDLINE KIT)
15.0000 mL | Freq: Two times a day (BID) | OROMUCOSAL | Status: DC
  Administered 2017-04-24 – 2017-05-21 (×52): 15 mL via OROMUCOSAL
  Filled 2017-04-23 (×2): qty 15

## 2017-04-23 MED ORDER — FENTANYL CITRATE (PF) 250 MCG/5ML IJ SOLN
INTRAMUSCULAR | Status: AC
  Filled 2017-04-23: qty 5

## 2017-04-23 MED ORDER — DOCUSATE SODIUM 50 MG/5ML PO LIQD
100.0000 mg | Freq: Two times a day (BID) | ORAL | Status: DC | PRN
  Administered 2017-04-29 – 2017-05-09 (×3): 100 mg
  Filled 2017-04-23 (×3): qty 10

## 2017-04-23 MED ORDER — MANNITOL 25 % IV SOLN
INTRAVENOUS | Status: DC | PRN
  Administered 2017-04-23: 50 g via INTRAVENOUS

## 2017-04-23 MED ORDER — THROMBIN 20000 UNITS EX SOLR
CUTANEOUS | Status: AC
  Filled 2017-04-23: qty 20000

## 2017-04-23 MED ORDER — LIDOCAINE HCL (CARDIAC) 20 MG/ML IV SOLN
INTRAVENOUS | Status: DC | PRN
  Administered 2017-04-23: 80 mg via INTRAVENOUS

## 2017-04-23 MED ORDER — HYDROCODONE-ACETAMINOPHEN 5-325 MG PO TABS: 1.0000 | ORAL_TABLET | ORAL | Status: DC | PRN

## 2017-04-23 MED ORDER — THROMBIN (RECOMBINANT) 5000 UNITS EX SOLR
OROMUCOSAL | Status: DC | PRN
  Administered 2017-04-23: 5 mL via TOPICAL

## 2017-04-23 MED ORDER — THROMBIN 5000 UNITS EX SOLR
CUTANEOUS | Status: AC
  Filled 2017-04-23: qty 5000

## 2017-04-23 MED ORDER — CEFAZOLIN SODIUM-DEXTROSE 2-3 GM-%(50ML) IV SOLR
INTRAVENOUS | Status: DC | PRN
  Administered 2017-04-23: 2 g via INTRAVENOUS

## 2017-04-23 MED ORDER — FENTANYL CITRATE (PF) 100 MCG/2ML IJ SOLN
100.0000 ug | INTRAMUSCULAR | Status: DC | PRN
  Administered 2017-04-27: 100 ug via INTRAVENOUS
  Filled 2017-04-23 (×3): qty 2

## 2017-04-23 MED ORDER — SODIUM CHLORIDE 0.9 % IR SOLN
Status: DC | PRN
  Administered 2017-04-23: 500 mL

## 2017-04-23 MED ORDER — POTASSIUM CHLORIDE 10 MEQ/100ML IV SOLN
10.0000 meq | INTRAVENOUS | Status: AC
  Administered 2017-04-23 (×2): 10 meq via INTRAVENOUS

## 2017-04-23 MED ORDER — BACITRACIN ZINC 500 UNIT/GM EX OINT
TOPICAL_OINTMENT | CUTANEOUS | Status: AC
  Filled 2017-04-23: qty 28.35

## 2017-04-23 MED ORDER — CEFAZOLIN SODIUM-DEXTROSE 1-4 GM/50ML-% IV SOLN
1.0000 g | Freq: Three times a day (TID) | INTRAVENOUS | Status: AC
  Administered 2017-04-23 (×2): 1 g via INTRAVENOUS
  Filled 2017-04-23 (×2): qty 50

## 2017-04-23 MED ORDER — ROCURONIUM BROMIDE 100 MG/10ML IV SOLN
INTRAVENOUS | Status: DC | PRN
  Administered 2017-04-23: 40 mg via INTRAVENOUS

## 2017-04-23 MED ORDER — LIDOCAINE-EPINEPHRINE 1 %-1:100000 IJ SOLN
INTRAMUSCULAR | Status: DC | PRN
  Administered 2017-04-23: 20 mL

## 2017-04-23 MED ORDER — DEXTROSE 5 % IV SOLN
INTRAVENOUS | Status: DC | PRN
  Administered 2017-04-23: 30 ug/min via INTRAVENOUS

## 2017-04-23 MED ORDER — LABETALOL HCL 5 MG/ML IV SOLN
10.0000 mg | INTRAVENOUS | Status: DC | PRN
  Administered 2017-04-23 (×2): 10 mg via INTRAVENOUS

## 2017-04-23 MED ORDER — SUGAMMADEX SODIUM 200 MG/2ML IV SOLN
INTRAVENOUS | Status: DC | PRN
  Administered 2017-04-23: 120 mg via INTRAVENOUS

## 2017-04-23 MED ORDER — VITAL HIGH PROTEIN PO LIQD
1000.0000 mL | ORAL | Status: AC
  Administered 2017-04-24 – 2017-05-11 (×20): 1000 mL
  Filled 2017-04-23 (×13): qty 1000

## 2017-04-23 MED ORDER — NALOXONE HCL 0.4 MG/ML IJ SOLN: 0.0800 mg | INTRAMUSCULAR | Status: DC | PRN

## 2017-04-23 MED ORDER — LIDOCAINE-EPINEPHRINE 1 %-1:100000 IJ SOLN
INTRAMUSCULAR | Status: AC
  Filled 2017-04-23: qty 1

## 2017-04-23 MED ORDER — MICROFIBRILLAR COLL HEMOSTAT EX PADS
MEDICATED_PAD | CUTANEOUS | Status: DC | PRN
  Administered 2017-04-23: 1 via TOPICAL

## 2017-04-23 MED ORDER — VITAL HIGH PROTEIN PO LIQD: 1000.0000 mL | ORAL | Status: DC

## 2017-04-23 MED ORDER — LABETALOL HCL 5 MG/ML IV SOLN
INTRAVENOUS | Status: AC
  Filled 2017-04-23: qty 4

## 2017-04-23 MED ORDER — FENTANYL CITRATE (PF) 100 MCG/2ML IJ SOLN
INTRAMUSCULAR | Status: DC | PRN
  Administered 2017-04-23 (×3): 50 ug via INTRAVENOUS

## 2017-04-23 MED ORDER — PROMETHAZINE HCL 25 MG PO TABS
12.5000 mg | ORAL_TABLET | ORAL | Status: DC | PRN
Start: 2017-04-23 — End: 2017-05-10

## 2017-04-23 MED ORDER — HYDROMORPHONE HCL 1 MG/ML IJ SOLN: 0.5000 mg | INTRAMUSCULAR | Status: DC | PRN

## 2017-04-23 MED ORDER — LEVETIRACETAM IN NACL 500 MG/100ML IV SOLN
500.0000 mg | Freq: Two times a day (BID) | INTRAVENOUS | Status: DC
  Administered 2017-04-23 – 2017-04-24 (×2): 500 mg via INTRAVENOUS
  Filled 2017-04-23 (×3): qty 100

## 2017-04-23 MED ORDER — LEVETIRACETAM IN NACL 1000 MG/100ML IV SOLN: 1000.0000 mg | Freq: Once | INTRAVENOUS | Status: DC

## 2017-04-23 MED ORDER — FAMOTIDINE 40 MG/5ML PO SUSR
20.0000 mg | Freq: Every day | ORAL | Status: DC
  Filled 2017-04-23: qty 2.5

## 2017-04-23 MED ORDER — PROPOFOL 10 MG/ML IV BOLUS
INTRAVENOUS | Status: DC | PRN
  Administered 2017-04-23: 140 mg via INTRAVENOUS
  Administered 2017-04-23: 50 mg via INTRAVENOUS

## 2017-04-23 MED ORDER — SODIUM CHLORIDE 0.9 % IV SOLN
INTRAVENOUS | Status: DC | PRN
  Administered 2017-04-23: 07:00:00 via INTRAVENOUS

## 2017-04-23 MED ORDER — PRO-STAT SUGAR FREE PO LIQD: 30.0000 mL | Freq: Two times a day (BID) | ORAL | Status: DC

## 2017-04-23 MED ORDER — SODIUM CHLORIDE 0.9 % IV SOLN
1000.0000 mg | INTRAVENOUS | Status: AC
  Administered 2017-04-23: 1000 mg via INTRAVENOUS
  Filled 2017-04-23: qty 10

## 2017-04-23 MED ORDER — SODIUM CHLORIDE 0.9 % IV SOLN: Freq: Once | INTRAVENOUS | Status: DC

## 2017-04-23 MED ORDER — MANNITOL 20 % IV SOLN
INTRAVENOUS | Status: AC
  Filled 2017-04-23: qty 500

## 2017-04-23 MED ORDER — BACITRACIN ZINC 500 UNIT/GM EX OINT
TOPICAL_OINTMENT | CUTANEOUS | Status: DC | PRN
  Administered 2017-04-23: 1 via TOPICAL

## 2017-04-23 MED ORDER — SODIUM CHLORIDE 0.9 % IR SOLN
Status: DC | PRN
  Administered 2017-04-23: 1
  Administered 2017-04-23: 1000 mL

## 2017-04-23 MED ORDER — PROPOFOL 10 MG/ML IV BOLUS
INTRAVENOUS | Status: AC
  Filled 2017-04-23: qty 20

## 2017-04-23 MED ORDER — HEMOSTATIC AGENTS (NO CHARGE) OPTIME
TOPICAL | Status: DC | PRN
  Administered 2017-04-23: 1 via TOPICAL

## 2017-04-23 MED ORDER — ORAL CARE MOUTH RINSE
15.0000 mL | OROMUCOSAL | Status: DC
  Administered 2017-04-24 – 2017-05-12 (×186): 15 mL via OROMUCOSAL

## 2017-04-23 MED ORDER — POTASSIUM CHLORIDE 20 MEQ PO PACK
40.0000 meq | PACK | Freq: Once | ORAL | Status: AC
  Administered 2017-04-23 (×2): 10 meq via ORAL

## 2017-04-23 MED ORDER — SURGIFOAM 100 EX MISC
CUTANEOUS | Status: DC | PRN
  Administered 2017-04-23: 20 mL via TOPICAL

## 2017-04-23 SURGICAL SUPPLY — 69 items
BAG DECANTER FOR FLEXI CONT (MISCELLANEOUS) ×3 IMPLANT
BLADE CLIPPER SURG (BLADE) ×3 IMPLANT
BNDG COHESIVE 4X5 TAN NS LF (GAUZE/BANDAGES/DRESSINGS) IMPLANT
BUR ACORN 6.0 PRECISION (BURR) ×2 IMPLANT
BUR ACORN 6.0MM PRECISION (BURR) ×1
BUR SPIRAL ROUTER 2.3 (BUR) ×2 IMPLANT
BUR SPIRAL ROUTER 2.3MM (BUR) ×1
CANISTER SUCT 3000ML PPV (MISCELLANEOUS) ×6 IMPLANT
CARTRIDGE OIL MAESTRO DRILL (MISCELLANEOUS) ×1 IMPLANT
CLIP VESOCCLUDE MED 6/CT (CLIP) IMPLANT
CONT SPEC 4OZ CLIKSEAL STRL BL (MISCELLANEOUS) ×3 IMPLANT
DIFFUSER DRILL AIR PNEUMATIC (MISCELLANEOUS) ×3 IMPLANT
DRAPE CAMERA VIDEO/LASER (DRAPES) IMPLANT
DRAPE MICROSCOPE LEICA (MISCELLANEOUS) IMPLANT
DRAPE NEUROLOGICAL W/INCISE (DRAPES) ×3 IMPLANT
DRAPE STERI IOBAN 125X83 (DRAPES) IMPLANT
DRAPE SURG 17X23 STRL (DRAPES) IMPLANT
DRAPE WARM FLUID 44X44 (DRAPE) ×3 IMPLANT
ELECT CAUTERY BLADE 6.4 (BLADE) ×3 IMPLANT
ELECT REM PT RETURN 9FT ADLT (ELECTROSURGICAL) ×3
ELECTRODE REM PT RTRN 9FT ADLT (ELECTROSURGICAL) ×1 IMPLANT
GAUZE SPONGE 4X4 12PLY STRL (GAUZE/BANDAGES/DRESSINGS) ×3 IMPLANT
GAUZE SPONGE 4X4 16PLY XRAY LF (GAUZE/BANDAGES/DRESSINGS) IMPLANT
GLOVE BIO SURGEON STRL SZ8 (GLOVE) ×3 IMPLANT
GLOVE BIOGEL PI IND STRL 7.0 (GLOVE) ×2 IMPLANT
GLOVE BIOGEL PI IND STRL 7.5 (GLOVE) ×3 IMPLANT
GLOVE BIOGEL PI IND STRL 8.5 (GLOVE) ×1 IMPLANT
GLOVE BIOGEL PI INDICATOR 7.0 (GLOVE) ×4
GLOVE BIOGEL PI INDICATOR 7.5 (GLOVE) ×6
GLOVE BIOGEL PI INDICATOR 8.5 (GLOVE) ×2
GLOVE ECLIPSE 9.0 STRL (GLOVE) ×3 IMPLANT
GLOVE EXAM NITRILE LRG STRL (GLOVE) IMPLANT
GLOVE EXAM NITRILE XL STR (GLOVE) IMPLANT
GLOVE EXAM NITRILE XS STR PU (GLOVE) IMPLANT
GOWN STRL REUS W/ TWL LRG LVL3 (GOWN DISPOSABLE) IMPLANT
GOWN STRL REUS W/ TWL XL LVL3 (GOWN DISPOSABLE) ×3 IMPLANT
GOWN STRL REUS W/TWL 2XL LVL3 (GOWN DISPOSABLE) IMPLANT
GOWN STRL REUS W/TWL LRG LVL3 (GOWN DISPOSABLE)
GOWN STRL REUS W/TWL XL LVL3 (GOWN DISPOSABLE) ×6
HEMOSTAT POWDER SURGIFOAM 1G (HEMOSTASIS) ×3 IMPLANT
HEMOSTAT SURGICEL 2X14 (HEMOSTASIS) ×3 IMPLANT
KIT BASIN OR (CUSTOM PROCEDURE TRAY) ×3 IMPLANT
KIT ROOM TURNOVER OR (KITS) ×3 IMPLANT
NEEDLE HYPO 18GX1.5 BLUNT FILL (NEEDLE) IMPLANT
NEEDLE HYPO 25X1 1.5 SAFETY (NEEDLE) ×3 IMPLANT
NS IRRIG 1000ML POUR BTL (IV SOLUTION) ×6 IMPLANT
OIL CARTRIDGE MAESTRO DRILL (MISCELLANEOUS) ×3
PACK CRANIOTOMY CUSTOM (CUSTOM PROCEDURE TRAY) ×3 IMPLANT
PAD ARMBOARD 7.5X6 YLW CONV (MISCELLANEOUS) ×9 IMPLANT
PATTIES SURGICAL .25X.25 (GAUZE/BANDAGES/DRESSINGS) IMPLANT
PATTIES SURGICAL .5 X.5 (GAUZE/BANDAGES/DRESSINGS) IMPLANT
PATTIES SURGICAL .5 X3 (DISPOSABLE) IMPLANT
PATTIES SURGICAL 1X1 (DISPOSABLE) IMPLANT
PLATE 1.5 6HOLE XLONG DBL Y (Plate) ×3 IMPLANT
PLATE 1.5/0.5 22MM BURR HO (Plate) ×6 IMPLANT
RUBBERBAND STERILE (MISCELLANEOUS) ×6 IMPLANT
SCREW SELF DRILL HT 1.5/4MM (Screw) ×42 IMPLANT
SPONGE NEURO XRAY DETECT 1X3 (DISPOSABLE) ×3 IMPLANT
SPONGE SURGIFOAM ABS GEL 100 (HEMOSTASIS) ×6 IMPLANT
STAPLER VISISTAT 35W (STAPLE) ×6 IMPLANT
SUT NURALON 4 0 TR CR/8 (SUTURE) ×6 IMPLANT
SUT VIC AB 2-0 CT2 18 VCP726D (SUTURE) ×9 IMPLANT
SYR CONTROL 10ML LL (SYRINGE) IMPLANT
TAPE CLOTH SURG 4X10 WHT LF (GAUZE/BANDAGES/DRESSINGS) ×3 IMPLANT
TOWEL GREEN STERILE (TOWEL DISPOSABLE) ×3 IMPLANT
TOWEL GREEN STERILE FF (TOWEL DISPOSABLE) ×3 IMPLANT
TRAY FOLEY W/METER SILVER 16FR (SET/KITS/TRAYS/PACK) ×3 IMPLANT
UNDERPAD 30X30 (UNDERPADS AND DIAPERS) IMPLANT
WATER STERILE IRR 1000ML POUR (IV SOLUTION) ×3 IMPLANT

## 2017-04-23 NOTE — Progress Notes (Signed)
Initial Nutrition Assessment  DOCUMENTATION CODES:   Severe malnutrition in context of chronic illness  INTERVENTION:   Initiate Vital HP at 82mL/hr x 24 hours. Advance 41mL every 12 hours to goal rate of 85mL/hr x 24hrs  This provides 1320kcal (101% of needs), 116g protein (100% of needs), and 1152mL free water.   MVI  Will monitor K, Mg, Phos based on TF protocol. Pt is at high risk for refeeding syndrome given malnutrition status.  NUTRITION DIAGNOSIS:   Severe Malnutrition related to chronic illness(brain tumor) as evidenced by severe fat depletion, severe muscle depletion, percent weight loss.  GOAL:   Patient will meet greater than or equal to 90% of their needs  MONITOR:   Weight trends, TF tolerance, Labs, I & O's  REASON FOR ASSESSMENT:   Ventilator, Consult Enteral/tube feeding initiation and management  ASSESSMENT:   67 y/o F who presented 3/11 with a 2 day history of worsening right sided weakness. 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 Post procedure, she failed extubation and required re-intubation. PMH of blindness, DM type 1, GERD, hyperlipidemia, schizophrenia, SAH, HTN, and Rosai Dorfman. At baseline pt dependent for all ADL's.  Pt has remained NPO for entire length of stay 3/11-current. Pt intubated 3/15 NG/OG Tube not in place yet.  Net positive 3.86L since admit 1L urine output since 0700 this morning.  Pt currently on ventilator support with observed minute ventilation of 7.6L/min. Tmax x 24 hours 37C used to calculate needs.  Chart reviewed; recommend pt is safe to feed but at risk for re-feeding syndrome given NPO x5day and muscle/fat wasting NFPE findings.   Unable to obtain wt hx from pt; no family at bedside. Per chart review pt has lost 17% body wt in 10 months, which is significant for time frame.   Medications: Pepcid, insulin, protonix, cefazolin, KCl every  4hrs. 1/2 NS IVF @ 80ml/hr  Labs: K (L;3.1) Mg WNL  NUTRITION - FOCUSED PHYSICAL EXAM:    Most Recent Value  Orbital Region  Moderate depletion  Upper Arm Region  Moderate depletion  Thoracic and Lumbar Region  Severe depletion  Buccal Region  Unable to assess  Temple Region  Severe depletion  Clavicle Bone Region  Mild depletion  Clavicle and Acromion Bone Region  Severe depletion  Scapular Bone Region  Severe depletion  Dorsal Hand  Moderate depletion  Patellar Region  Severe depletion  Anterior Thigh Region  Severe depletion  Posterior Calf Region  Severe depletion  Edema (RD Assessment)  None  Hair  Reviewed  Eyes  Unable to assess  Mouth  Unable to assess  Skin  Reviewed  Nails  Reviewed       Diet Order:  Fall precautions Seizure precautions Aspiration precautions Diet NPO time specified  EDUCATION NEEDS:   Not appropriate for education at this time  Skin:  Skin Assessment: Skin Integrity Issues: Skin Integrity Issues:: Stage II, Incisions Stage II: sacrum Incisions: head  Last BM:  3/14 type 3 small  Height:   Ht Readings from Last 1 Encounters:  04/22/17 5\' 6"  (1.676 m)    Weight:   Wt Readings from Last 10 Encounters:  04/19/17 130 lb (59 kg)  06/16/16 156 lb 9.6 oz (71 kg)  12/18/15 176 lb 12.8 oz (80.2 kg)  11/11/15 175 lb 9.6 oz (79.7 kg)  10/22/15 174 lb 9.6 oz (79.2 kg)  10/01/15 170 lb (77.1 kg)  09/17/15  179 lb 11.2 oz (81.5 kg)  08/15/15 177 lb (80.3 kg)  09/21/14 179 lb (81.2 kg)  12/04/13 183 lb (83 kg)     Ideal Body Weight:  59.1 kg  BMI:  Body mass index is 20.98 kg/m.  Estimated Nutritional Needs:   Kcal:  1313  Protein:  100-115g  Fluid:  >1.3L   Jodeci Roarty, MS, Dietetic Intern Pager # [phone]

## 2017-04-23 NOTE — Progress Notes (Signed)
Transported pt from PACU to 4N on full support on vent and 100% FIO2.  Tolerated well and stable throughout transport.

## 2017-04-23 NOTE — Anesthesia Procedure Notes (Signed)
Procedure Name: Intubation Date/Time: 04/23/2017 8:19 AM Performed by: Josie Dixon, CRNA Pre-anesthesia Checklist: Patient identified, Emergency Drugs available, Suction available and Patient being monitored Patient Re-evaluated:Patient Re-evaluated prior to induction Oxygen Delivery Method: Circle system utilized Preoxygenation: Pre-oxygenation with 100% oxygen Induction Type: IV induction Ventilation: Mask ventilation without difficulty and Mask ventilation throughout procedure Laryngoscope Size: Glidescope and 3 Grade View: Grade I Tube type: Subglottic suction tube Tube size: 7.0 mm Number of attempts: 1 Airway Equipment and Method: Stylet and Video-laryngoscopy Placement Confirmation: ETT inserted through vocal cords under direct vision,  positive ETCO2 and breath sounds checked- equal and bilateral Secured at: 19 cm Tube secured with: Tape Dental Injury: Teeth and Oropharynx as per pre-operative assessment

## 2017-04-23 NOTE — Progress Notes (Signed)
Pt with sudden agonal breathing, Bagged, phoned ANeshthesia , bagging then pt breathing on her own, pt still agonal , Dr Orene Desanctis here

## 2017-04-23 NOTE — Anesthesia Preprocedure Evaluation (Addendum)
Anesthesia Evaluation   Patient unresponsive    Reviewed: Allergy & Precautions, NPO status , Patient's Chart, lab work & pertinent test results  Airway Mallampati: III  TM Distance: <3 FB Neck ROM: Limited    Dental  (+) Teeth Intact   Pulmonary neg pulmonary ROS,     + decreased breath sounds      Cardiovascular hypertension,  Rhythm:Regular Rate:Tachycardia     Neuro/Psych Schizophrenia Brain tumor  Neuromuscular disease    GI/Hepatic GERD  ,  Endo/Other  diabetes  Renal/GU      Musculoskeletal   Abdominal   Peds  Hematology   Anesthesia Other Findings   Reproductive/Obstetrics                            Anesthesia Physical Anesthesia Plan  ASA: IV  Anesthesia Plan: General   Post-op Pain Management:    Induction: Intravenous  PONV Risk Score and Plan: 4 or greater and Treatment may vary due to age or medical condition  Airway Management Planned: Oral ETT  Additional Equipment: Arterial line  Intra-op Plan:   Post-operative Plan: Post-operative intubation/ventilation  Informed Consent: I have reviewed the patients History and Physical, chart, labs and discussed the procedure including the risks, benefits and alternatives for the proposed anesthesia with the patient or authorized representative who has indicated his/her understanding and acceptance.     Plan Discussed with: CRNA  Anesthesia Plan Comments:         Anesthesia Quick Evaluation

## 2017-04-23 NOTE — Anesthesia Postprocedure Evaluation (Signed)
Anesthesia Post Note  Patient: Rachel [name]  Procedure(s) Performed: CRANIOTOMY TUMOR EXCISION (Left Head)     Patient location during evaluation: SICU Anesthesia Type: General Level of consciousness: sedated and patient remains intubated per anesthesia plan Pain management: pain level controlled Vital Signs Assessment: post-procedure vital signs reviewed and stable Respiratory status: patient remains intubated per anesthesia plan Cardiovascular status: stable Postop Assessment: no apparent nausea or vomiting Anesthetic complications: no    Last Vitals:  Vitals:   04/23/17 1215 04/23/17 1300  BP:    Pulse: (!) 58 (!) 58  Resp: 16 16  Temp: (!) 36.1 C 36.8 C  SpO2: 100% 100%    Last Pain:  Vitals:   04/23/17 1300  TempSrc: Oral  PainSc:                  Houston Zapien,JAMES TERRILL

## 2017-04-23 NOTE — Progress Notes (Signed)
Dr. Trenton Gammon updated on patient condition: Opens eyes to pain, does not follow commands, fixed left gaze, withdraws from pain on bilateral lower extremities at times, some nonpurposeful movement on left lower extremity, when attempting to place NGT met significant resistance and patient moved right arm spontaneously. NGT placement unsuccessful at this time. Continuing with current orders. Continuing to monitor.

## 2017-04-23 NOTE — Anesthesia Procedure Notes (Signed)
Procedure Name: Intubation Date/Time: 04/23/2017 11:45 AM Performed by: Lavell Luster, CRNA Pre-anesthesia Checklist: Patient identified, Emergency Drugs available, Suction available, Patient being monitored and Timeout performed Patient Re-evaluated:Patient Re-evaluated prior to induction Oxygen Delivery Method: Circle system utilized Preoxygenation: Pre-oxygenation with 100% oxygen Induction Type: IV induction Ventilation: Mask ventilation without difficulty Laryngoscope Size: Glidescope, 3 and Mac Grade View: Grade I Tube type: Oral Tube size: 7.0 mm Number of attempts: 1 Airway Equipment and Method: Video-laryngoscopy and Stylet Placement Confirmation: ETT inserted through vocal cords under direct vision,  positive ETCO2,  breath sounds checked- equal and bilateral and CO2 detector Secured at: 19 cm Tube secured with: Tape Dental Injury: Teeth and Oropharynx as per pre-operative assessment  Comments: Called to recovery room shortly after dropping patient off for re-intubationdue to apnea, lethargy, pt not responding to painful stimuli as she was pre-procedure.  Dr Orene Desanctis at bedside to assist w intubation.  Full report given to RT and RN.

## 2017-04-23 NOTE — Op Note (Signed)
Date of procedure: 04/23/2017  Date of dictation: Same  Service: Neurosurgery  Preoperative diagnosis: Left middle fossa extra axial neoplasm x2, history of Renai Dorfman disease  Postoperative diagnosis: Same  Procedure Name: Left pterional craniotomy with resection of dural based tumors x2 requiring microdissection  Surgeon:Phill Steck A.Murtaza Shell, M.D.  Asst. Surgeon: Saintclair Halsted  Anesthesia: General  Indication: 67 year old female with Renai-Dorfman disease status post previous occipital craniectomy and resection of tumor many years ago.  Patient presents with enlarging left middle fossa extra-axial masses consistent with meningioma versus lymphoproliferative tissue associated with her and I Dorfman.  These masses are causing marked underlying vasogenic edema.  Patient with progressive aphasia and right-sided weakness.  Patient presents now for operative resection of her left lateral temporal and left sphenoid wing tumors.  Operative note: After induction of anesthesia, patient position supine with neck turned toward the right.  Patient's left scalp was prepped and draped sterilely.  A curvilinear incision is made from the zygoma behind the hairline toward the midline.  Scalp flap was reflected anteriorly as was the temporalis muscle.  Pterional craniotomy was then performed using high-speed drill.  Bone flap was elevated.  Further bone was removed better exposing the floor of the middle fossa.  The lateral aspect of the sphenoid wing was resected.  The dura was opened.  The tumor was readily apparent.  There was a layer of reactive tissue emerging with the arachnoid overlying the left frontal and temporal lobes.  This was dissected free.  The tumor itself was tightly attached to the underlying brain.  This was gradually dissected free using the microscope for microdissection.  The tumor was then resected completely.  No evidence of any residual disease left behind with the larger more posterior lesion.   Hemostasis was achieved with the bipolar cautery.  Attention was then placed to the sphenoid wing component of the tumor.  This was dissected free and resected completely from the temporal tip on the left side.  Hemostasis was achieved using bipolar cautery.  Wounds and irrigated fanlike solution.  Surgicel was placed over the areas of arachnoid violation.  Dura matter was then reapproximated with Nurolon suture.  Gelfoam was placed over the craniotomy defect.  The cranial flap was then reattached using OsteoMed plates.  Scalp was reapproximated using 2-0 Vicryl suture at the galea and temporalis fascia and staples of the surface.  There were no apparent complications.  Patient tolerated the procedure well and she returns to the recovery room postop.

## 2017-04-23 NOTE — Progress Notes (Signed)
Pt reintubated per CRNA, Earnest Bailey, 50 mg of Fentanyl given and 40 of rocuronium, Bilaterl lung sounds equal with rhonci, pt continues to not withdrawal  to pain, pupils equal and pinpoint, Dr Annette Stable made aware

## 2017-04-23 NOTE — Progress Notes (Signed)
Dr. Halford Chessman made aware unable to place NGT. Ok per MD to hold off on NGT placement until CT results tomorrow. Nursing to continue to monitor

## 2017-04-23 NOTE — Transfer of Care (Signed)
Immediate Anesthesia Transfer of Care Note  Patient: Rachel [name]  Procedure(s) Performed: CRANIOTOMY TUMOR EXCISION (Left Head)  Patient Location: PACU  Anesthesia Type:General  Level of Consciousness: sedated and lethargic  Airway & Oxygen Therapy: Patient Spontanous Breathing  Post-op Assessment: Post -op Vital signs reviewed and stable  Post vital signs: stable  Last Vitals:  Vitals:   04/23/17 0000 04/23/17 0409  BP: (!) 155/88   Pulse: 60   Resp: 15   Temp:  37 C  SpO2: 98%     Last Pain:  Vitals:   04/23/17 0409  TempSrc: Oral  PainSc:          Complications: No apparent anesthesia complications

## 2017-04-23 NOTE — Consult Note (Signed)
PULMONARY / CRITICAL CARE MEDICINE   Name: Rachel [name] MRN: [id number] DOB: 28-Jan-1951    ADMISSION DATE:  04/19/2017 CONSULTATION DATE:  04/23/17  REFERRING MD:  Dr. Trenton Gammon   CHIEF COMPLAINT:  Respiratory Insufficiency   HISTORY OF PRESENT ILLNESS:  67 y/o F who presented 3/11 with a 2 day history of worsening right sided weakness.    At baseline, she is dependent for all ADL's. She required assistance to walk, has to be fed.  She has a history of Rosai Dorfman and had previously undergone an occipital craniotomy for removal of lesions in the past.  She recently had a stable appearance of meningeal lesions in the left middle cranial fossa.  She has a hx of schizophrenia and OSA but not on CPAP after at trial with no improvement.    Initial ER evaluation included a CT of the head which showed extensive left sided vasogenic edema involving the external capsule extending to the temporal lobe, parietal lobe with associated mass-effect with compression of the left parietal ventricle, 15mm left to right midline shift. She was treated with IV decadron and evaluated by NSGY. 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 Post procedure, she failed extubation and required re-intubation.  She was returned to ICU on mechanical ventilation.     PCCM consulted for post-operative ICU care.   PAST MEDICAL HISTORY :  She  has a past medical history of Blindness, Diabetes mellitus type I ([place]), GERD (gastroesophageal reflux disease), Hyperlipidemia, Paroxysmal atrial fibrillation ([place]), Schizophrenia ([place]), Sleep apnea, Subarachnoid hemorrhage ([place]), Systemic hypertension, and Vertigo.  PAST SURGICAL HISTORY: She  has a past surgical history that includes Brain tumor excision; US ECHOCARDIOGRAPHY (12/20/2003); and NM MYOCAR PERF WALL MOTION (02/01/2007).  Allergies  Allergen Reactions  . Sulfa Antibiotics Anaphylaxis  . Ibuprofen Other  (See Comments)    unknown  . Shellfish Allergy Swelling    Mouth    No current facility-administered medications on file prior to encounter.    Current Outpatient Medications on File Prior to Encounter  Medication Sig  . acetaminophen (TYLENOL) 500 MG tablet Take 1,000 mg by mouth every 6 (six) hours as needed for mild pain.  Marland Kitchen ALPRAZolam (XANAX) 0.25 MG tablet TAKE 1 TABLET BY MOUTH THREE TIMES DAILY AS NEEDED FOR ANXIETY  . diltiazem (CARDIZEM CD) 180 MG 24 hr capsule Take 1 capsule (180 mg total) by mouth daily.  . famotidine (PEPCID) 20 MG tablet Take 20 mg by mouth daily.   . insulin NPH Human (HUMULIN N,NOVOLIN N) 100 UNIT/ML injection Inject 0.18 mLs (18 Units total) into the skin 2 (two) times daily at 8 am and 10 pm.  . loratadine (CLARITIN) 10 MG tablet Take 10 mg by mouth daily as needed for allergies.   Marland Kitchen PARoxetine (PAXIL) 40 MG tablet Take 1 tablet (40 mg total) at bedtime by mouth.  . risperiDONE (RISPERDAL) 2 MG tablet Take 1 tablet (2 mg total) by mouth at bedtime.  . rosuvastatin (CRESTOR) 10 MG tablet Take 1 tablet (10 mg total) by mouth every evening.  . temazepam (RESTORIL) 15 MG capsule Take 1 capsule (15 mg total) at bedtime as needed by mouth for sleep.  . traZODone (DESYREL) 50 MG tablet Take 1 tablet (50 mg total) by mouth at bedtime.    FAMILY HISTORY:  Her indicated that her mother is deceased. She indicated that her father is deceased. She indicated that all of  her three brothers are alive. She indicated that her maternal grandmother is deceased. She indicated that her maternal grandfather is deceased. She indicated that her paternal grandmother is deceased. She indicated that her paternal grandfather is deceased. She indicated that the status of her neg hx is unknown.   SOCIAL HISTORY: She  reports that  has never smoked. she has never used smokeless tobacco. She reports that she does not drink alcohol or use drugs.  REVIEW OF SYSTEMS:  Unable to complete  as patient is altered on mechanical ventilation.   SUBJECTIVE:  RN reports pt comfortable on vent  VITAL SIGNS: BP (!) 154/69   Pulse (!) 58   Temp 98.3 F (36.8 C) (Oral)   Resp 16   Ht 5\' 6"  (1.676 m)   Wt 130 lb (59 kg)   SpO2 100%   BMI 20.98 kg/m   HEMODYNAMICS:    VENTILATOR SETTINGS: Vent Mode: PRVC FiO2 (%):  [100 %] 100 % Set Rate:  [16 bmp] 16 bmp Vt Set:  [480 mL] 480 mL PEEP:  [5 cmH20] 5 cmH20 Plateau Pressure:  [15 cmH20] 15 cmH20  INTAKE / OUTPUT: I/O last 3 completed shifts: In: 1896.7 [I.V.:1496.7; IV Piggyback:400] Out: 450 [Urine:450]  PHYSICAL EXAMINATION: General:  Ill appearing adult female in NAD on vent, appears older than age Neuro:  Sedate, no response to verbal stimuli, slight withdraw in lowers, pupils 84mm HEENT:  ETT, mm pink/moist Cardiovascular:  s1s2 rrr, no m/r/g  Lungs:  Even/non-labored, lungs bilaterally clear  Abdomen:  Soft/non-distended, BSx4 active  Musculoskeletal:  No acute deformities Skin:  Warm/dry, no rashes or lesions  LABS:  BMET Recent Labs  Lab 04/21/17 0359 04/22/17 0340 04/23/17 0429  NA 147* 146* 143  K 3.4* 3.1* 3.1*  CL 115* 110 109  CO2 24 28 28   BUN 11 14 12   CREATININE 0.61 0.57 0.60  GLUCOSE 115* 124* 93    Electrolytes Recent Labs  Lab 04/19/17 1237 04/20/17 0457 04/21/17 0359 04/22/17 0340 04/23/17 0429  CALCIUM 11.9* 11.3* 11.8* 10.8* 10.0  MG 2.1 2.0  --  2.1 2.0  PHOS 3.0 3.0  --   --   --     CBC Recent Labs  Lab 04/21/17 0359 04/22/17 0340 04/23/17 0429  WBC 12.5* 11.7* 9.0  HGB 14.4 14.4 14.0  HCT 43.8 42.9 41.7  PLT 196 158 145*    Coag's Recent Labs  Lab 04/19/17 1237  APTT 27  INR 1.01    Sepsis Markers No results for input(s): LATICACIDVEN, PROCALCITON, O2SATVEN in the last 168 hours.  ABG No results for input(s): PHART, PCO2ART, PO2ART in the last 168 hours.  Liver Enzymes Recent Labs  Lab 04/20/17 0457 04/21/17 0359 04/22/17 0340  AST 14* 18  54*  ALT 13* 15 47  ALKPHOS 52 54 49  BILITOT 0.7 0.5 0.7  ALBUMIN 3.0* 3.2* 2.9*    Cardiac Enzymes No results for input(s): TROPONINI, PROBNP in the last 168 hours.  Glucose Recent Labs  Lab 04/22/17 1233 04/22/17 1636 04/22/17 1931 04/22/17 2357 04/23/17 0412 04/23/17 1047  GLUCAP 91 102* 113* 87 80 82    Imaging No results found.   STUDIES:  CT Head 3/11 >> Extensive left-sided vasogenic edema involving external capsule, left-sided centrum semi ovale, extending into the temporal lobe and parietal lobe. There is associated mass effect with compression of the left lateral ventricle and 3 mm of left-to-right midline shift at the foramen of Monro. Indistinctness of the left-sided sulci  may be secondary to mass effect, however, new metastatic disease or manifestation of the patient's known histiocytosis may be the underlying etiology  CULTURES:   ANTIBIOTICS: Cefazolin 3/15 >>   SIGNIFICANT EVENTS: 3/11 Admit with progressive weakness, edema on CT head  3/15 Crani for resection of tumor, failed extubation   LINES/TUBES: ETT 3/15 >>   DISCUSSION: 67 y/o F admitted 3/11 with increasing weakness.  Known hx of Rosai Dorfman with previously stable CT head.  On admit found to have new edema with midline shift.  To OR 3/15 for left craniotomy for resection of tumor.   ASSESSMENT / PLAN:  PULMONARY A: Acute Respiratory Insufficiency  OSA - not on CPAP  4th Rib Fracture - noted on CXR  P:   PRVC 8 cc/kg  Wean PEEP / FiO2 for sats > 90% CXR now and in am  Follow up ABG in one hour   CARDIOVASCULAR A:  Hx PAF, HTN, HLD  P:  SBP goal < 170 per NSGY  ICU monitoring  Lopressor 5mg  IV Q6 PRN hydralazine, labetalol for BP goals  RENAL A:   Hypokalemia  P:   Trend BMP / urinary output Replace electrolytes as indicated Avoid nephrotoxic agents, ensure adequate renal perfusion  GASTROINTESTINAL A:   At Risk Malnutrition  P:   NPO / OGT  Begin TF per  Nutrition  Pepcid for SUP   HEMATOLOGIC A:   P:  Trend CBC  Monitor for bleeding  SCD's for DVT prophylaxis   INFECTIOUS A:   Post-Operative Crani  P:   Monitor surgical site Ancef per NSGY   ENDOCRINE A:   DM  P:    SSI, sensitive scale  Lantus 15 units QHS   NEUROLOGIC A:   Left Craniotomy s/p Tumor Removal  Cerebral Edema with Midline Shift  Rosai Dorfman Syndrome  P:   RASS goal: 0 to -1  PRN fentanyl / versed for pain /sedation  Serial neuro exams  Continue Keppra  Post-operative care per NSGY  Seizure precautions   FAMILY  - Updates:  No family at bedside 3/15.    - Inter-disciplinary family meet or Palliative Care meeting due by:  3/22  CC Time: 33 minutes   Noe Gens, NP-C [facility] Pgr: [phone] or if no answer [phone] 04/23/2017, 2:13 PM

## 2017-04-23 NOTE — Progress Notes (Signed)
OT Cancellation Note  Patient Details Name: MILLI WOOLRIDGE MRN: [id number] DOB: [birth year]   Cancelled Treatment:    Reason Eval/Treat Not Completed: Patient at procedure or test/ unavailable  Binnie Kand M.S., OTR/L Pager: [phone]  04/23/2017, 7:06 AM

## 2017-04-23 NOTE — Brief Op Note (Signed)
04/23/2017  10:28 AM  PATIENT:  Rachel [name]  67 y.o. female  PRE-OPERATIVE DIAGNOSIS:  Brain tumor  POST-OPERATIVE DIAGNOSIS:  Brain Tumor  PROCEDURE:  Procedure(s) with comments: CRANIOTOMY TUMOR EXCISION (Left) - left  SURGEON:  Surgeon(s) and Role:    * Earnie Larsson, MD - Primary    * Kary Kos, MD - Assisting  PHYSICIAN ASSISTANT:   ASSISTANTS:    ANESTHESIA:   general  EBL:  200 mL   BLOOD ADMINISTERED:none  DRAINS: none   LOCAL MEDICATIONS USED:  LIDOCAINE   SPECIMEN:  Source of Specimen:  Left extra-axial middle fossa mass x2  DISPOSITION OF SPECIMEN:  PATHOLOGY  COUNTS:  YES  TOURNIQUET:  * No tourniquets in log *  DICTATION: .Dragon Dictation  PLAN OF CARE: Admit to inpatient   PATIENT DISPOSITION:  PACU - hemodynamically stable.   Delay start of Pharmacological VTE agent (>24hrs) due to surgical blood loss or risk of bleeding: yes

## 2017-04-23 NOTE — Progress Notes (Signed)
Dr. Saintclair Halsted at bedside and updated on patient condition. Made aware of same assessment discussed with Dr. Trenton Gammon. Orders received for head CT in am. Nursing to continue to monitor.

## 2017-04-23 NOTE — Progress Notes (Signed)
Patient required reintubation very soon after arrival to PACU. Very somnolent and unable to protect airway.   Will ned ventilator and ICU followup

## 2017-04-23 NOTE — Anesthesia Procedure Notes (Signed)
Arterial Line Insertion Start/End3/15/2019 7:00 AM, 04/23/2017 7:06 AM Performed by: Josie Dixon, CRNA, CRNA  Patient location: OOR procedure area. Preanesthetic checklist: patient identified, IV checked, site marked, risks and benefits discussed, surgical consent, monitors and equipment checked and pre-op evaluation Lidocaine 1% used for infiltration Right, radial was placed Catheter size: 20 G Hand hygiene performed  and maximum sterile barriers used   Attempts: 1 Procedure performed without using ultrasound guided technique. Ultrasound Notes:anatomy identified, needle tip was noted to be adjacent to the nerve/plexus identified and no ultrasound evidence of intravascular and/or intraneural injection Following insertion, dressing applied and Biopatch. Post procedure assessment: normal  Patient tolerated the procedure well with no immediate complications.

## 2017-04-24 ENCOUNTER — Inpatient Hospital Stay (HOSPITAL_COMMUNITY): Payer: BLUE CROSS/BLUE SHIELD

## 2017-04-24 DIAGNOSIS — J9601 Acute respiratory failure with hypoxia: Secondary | ICD-10-CM

## 2017-04-24 LAB — CBC
HCT: 39.4 % (ref 36.0–46.0)
HEMOGLOBIN: 13.5 g/dL (ref 12.0–15.0)
MCH: 30.3 pg (ref 26.0–34.0)
MCHC: 34.3 g/dL (ref 30.0–36.0)
MCV: 88.5 fL (ref 78.0–100.0)
PLATELETS: 132 10*3/uL — AB (ref 150–400)
RBC: 4.45 MIL/uL (ref 3.87–5.11)
RDW: 13 % (ref 11.5–15.5)
WBC: 18.1 10*3/uL — ABNORMAL HIGH (ref 4.0–10.5)

## 2017-04-24 LAB — BASIC METABOLIC PANEL
ANION GAP: 10 (ref 5–15)
BUN: 13 mg/dL (ref 6–20)
CALCIUM: 9.2 mg/dL (ref 8.9–10.3)
CO2: 21 mmol/L — ABNORMAL LOW (ref 22–32)
CREATININE: 0.53 mg/dL (ref 0.44–1.00)
Chloride: 110 mmol/L (ref 101–111)
GLUCOSE: 106 mg/dL — AB (ref 65–99)
Potassium: 3.2 mmol/L — ABNORMAL LOW (ref 3.5–5.1)
Sodium: 141 mmol/L (ref 135–145)

## 2017-04-24 LAB — GLUCOSE, CAPILLARY
GLUCOSE-CAPILLARY: 175 mg/dL — AB (ref 65–99)
Glucose-Capillary: 125 mg/dL — ABNORMAL HIGH (ref 65–99)
Glucose-Capillary: 86 mg/dL (ref 65–99)
Glucose-Capillary: 134 mg/dL — ABNORMAL HIGH (ref 65–99)
Glucose-Capillary: 53 mg/dL — ABNORMAL LOW (ref 65–99)
Glucose-Capillary: 137 mg/dL — ABNORMAL HIGH (ref 65–99)

## 2017-04-24 LAB — MAGNESIUM: MAGNESIUM: 2 mg/dL (ref 1.7–2.4)

## 2017-04-24 LAB — PHOSPHORUS: Phosphorus: 1.5 mg/dL — ABNORMAL LOW (ref 2.5–4.6)

## 2017-04-24 LAB — LACTIC ACID, PLASMA: Lactic Acid, Venous: 1 mmol/L (ref 0.5–1.9)

## 2017-04-24 MED ORDER — TRAZODONE HCL 50 MG PO TABS
50.0000 mg | ORAL_TABLET | Freq: Every day | ORAL | Status: DC
  Administered 2017-04-24 – 2017-04-25 (×2): 50 mg
  Filled 2017-04-24 (×2): qty 1

## 2017-04-24 MED ORDER — RISPERIDONE 2 MG PO TABS
2.0000 mg | ORAL_TABLET | Freq: Every day | ORAL | Status: DC
  Administered 2017-04-24 – 2017-04-25 (×2): 2 mg
  Filled 2017-04-24 (×2): qty 1

## 2017-04-24 MED ORDER — LEVETIRACETAM IN NACL 1000 MG/100ML IV SOLN
1000.0000 mg | Freq: Two times a day (BID) | INTRAVENOUS | Status: DC
  Administered 2017-04-24 – 2017-05-07 (×28): 1000 mg via INTRAVENOUS
  Filled 2017-04-24 (×30): qty 100

## 2017-04-24 MED ORDER — PAROXETINE HCL 20 MG PO TABS
40.0000 mg | ORAL_TABLET | Freq: Every day | ORAL | Status: DC
Start: 2017-04-24 — End: 2017-05-02
  Administered 2017-04-24 – 2017-05-01 (×8): 40 mg
  Filled 2017-04-24 (×8): qty 2

## 2017-04-24 MED ORDER — POTASSIUM CHLORIDE 10 MEQ/100ML IV SOLN
10.0000 meq | INTRAVENOUS | Status: AC
  Administered 2017-04-24 (×4): 10 meq via INTRAVENOUS
  Filled 2017-04-24 (×4): qty 100

## 2017-04-24 MED ORDER — PANTOPRAZOLE SODIUM 40 MG PO PACK
40.0000 mg | PACK | Freq: Every day | ORAL | Status: DC
  Administered 2017-04-24 – 2017-05-11 (×18): 40 mg
  Filled 2017-04-24 (×18): qty 20

## 2017-04-24 MED ORDER — DEXAMETHASONE SODIUM PHOSPHATE 10 MG/ML IJ SOLN
6.0000 mg | Freq: Three times a day (TID) | INTRAMUSCULAR | Status: DC
  Administered 2017-04-24 – 2017-04-28 (×12): 6 mg via INTRAVENOUS
  Filled 2017-04-24 (×12): qty 1

## 2017-04-24 MED ORDER — DEXTROSE 50 % IV SOLN
INTRAVENOUS | Status: AC
  Administered 2017-04-24: 12:00:00
  Filled 2017-04-24: qty 50

## 2017-04-24 NOTE — Progress Notes (Signed)
Subjective: Patient reports remains intubated and unresponsive  Objective: Vital signs in last 24 hours: Temp:  [97 F (36.1 C)-98.5 F (36.9 C)] 98 F (36.7 C) (03/16 0345) Pulse Rate:  [45-104] 58 (03/16 0700) Resp:  [7-21] 16 (03/16 0700) BP: (153-177)/(69-104) 153/80 (03/15 2335) SpO2:  [90 %-100 %] 100 % (03/16 0700) Arterial Line BP: (135-221)/(71-118) 146/85 (03/16 0700) FiO2 (%):  [40 %-100 %] 40 % (03/15 2335) Weight:  [49.7 kg (109 lb 9.1 oz)-53 kg (116 lb 13.5 oz)] 53 kg (116 lb 13.5 oz) (03/16 0455)  Intake/Output from previous day: 03/15 0701 - 03/16 0700 In: 2773 [I.V.:2263; IV Piggyback:510] Out: 1685 [Urine:1485; Blood:200] Intake/Output this shift: Total I/O In: 100 [IV Piggyback:100] Out: -   patient will open eyes to stim has some minimal withdrawal movement essentially unchanged. Repeat CT scan shows some pneumocephalus although there is some mild mass effect on both frontal lobes I do not think that this is contributing to her current status and will resolve on its own. Cisterns appear to be patent no evidence of new infarct or hemorrhage.  Lab Results: Recent Labs    04/23/17 0429 04/24/17 0454  WBC 9.0 18.1*  HGB 14.0 13.5  HCT 41.7 39.4  PLT 145* 132*   BMET Recent Labs    04/23/17 0429 04/24/17 0454  NA 143 141  K 3.1* 3.2*  CL 109 110  CO2 28 21*  GLUCOSE 93 106*  BUN 12 13  CREATININE 0.60 0.53  CALCIUM 10.0 9.2    Studies/Results: Ct Head Wo Contrast  Result Date: 04/24/2017 CLINICAL DATA:  Status post tumor resection. EXAM: CT HEAD WITHOUT CONTRAST TECHNIQUE: Contiguous axial images were obtained from the base of the skull through the vertex without intravenous contrast. COMPARISON:  Brain MRI 04/19/2017 and head CT 04/19/2016 FINDINGS: Brain: Status post resection of lower left convexity mass. There is moderate volume pneumocephalus at the frontal poles and along the left convexity. There is mixed intra-axial and extra-axial blood  at the resection site (predominantly extra-axial). Slightly decreased edema in the left hemisphere. Persistent mass effect on the left lateral ventricle. At the level of the foramina of Monro, there is approximately 4 mm of rightward midline shift, unchanged. Vascular: No hyperdense vessel or unexpected vascular calcification. Skull: Status post left pterional craniotomy with overlying soft tissue swelling and skin staples. Remote suboccipital craniectomy. Sinuses/Orbits: No sinus fluid levels or advanced mucosal thickening. No mastoid effusion. Normal orbits. IMPRESSION: 1. Status post resection of lower left convexity tumor with postoperative pneumocephalus and mixed intra-axial/extra-axial blood at the resection site. 2. Unchanged 3-4 mm rightward midline shift. 3. Moderate volume pneumocephalus with flattening of the frontal poles. Attention on follow-up to exclude tension pneumocephalus. Electronically Signed   By: Ulyses Jarred M.D.   On: 04/24/2017 00:59   Portable Chest X-ray  Result Date: 04/23/2017 CLINICAL DATA:  Endotracheal tube placement. EXAM: PORTABLE CHEST 1 VIEW COMPARISON:  Radiograph of September 17, 2015. FINDINGS: The heart size and mediastinal contours are within normal limits. Endotracheal tube is seen projected over tracheal air shadow with distal tip 4 cm above the carina. Atherosclerosis of thoracic aorta is noted. No pneumothorax or pleural effusion is noted. Both lungs are clear. Minimally displaced right posterior fourth rib fracture is noted. IMPRESSION: Endotracheal tube in grossly good position. Probable minimally displaced right posterior fourth rib fracture. No other cardiopulmonary abnormality seen. Aortic Atherosclerosis (ICD10-I70.0). Electronically Signed   By: Marijo Conception, M.D.   On: 04/23/2017 14:36  Assessment/Plan: Postop day 1 craniotomy for resection of left temporal lobe mass. Patient remains intubated and with minimal response. CT scan is as above. Continue  observation and supportive care. Will increase dose of Keppra case this does represent some seizure activity. May consider neurology consult for possible EEG and etiology of encephalopathy.  LOS: 5 days     Zacariah Belue P 04/24/2017, 7:57 AM

## 2017-04-24 NOTE — Progress Notes (Signed)
Cortrak Tube Team Note:  Consult received to place a Cortrak feeding tube.   A 10 F Cortrak tube was placed in the R nare and secured with a nasal bridle at 82 cm. Per the Cortrak monitor reading the tube tip is just postpyloric.  No x-ray is required. RN may begin using tube.   If the tube becomes dislodged please keep the tube and contact the Cortrak team at [email or web address] (password [email or web address]) for replacement.  If after hours and replacement cannot be delayed, place a NG tube and confirm placement with an abdominal x-ray.   Burtis Junes RD, LDN, CNSC Clinical Nutrition Pager: [phone] 04/24/2017 11:22 AM

## 2017-04-24 NOTE — Progress Notes (Signed)
PULMONARY / CRITICAL CARE MEDICINE   Name: Rachel [name] MRN: [id number] DOB: 1950/02/11    ADMISSION DATE:  04/19/2017 CONSULTATION DATE:  04/23/17  REFERRING MD:  Dr. Trenton Gammon   CHIEF COMPLAINT:  Respiratory Insufficiency   HISTORY OF PRESENT ILLNESS:  67 y/o F who presented 3/11 with a 2 day history of worsening right sided weakness.    At baseline, she is dependent for all ADL's. She required assistance to walk, has to be fed.  She has a history of Rosai Dorfman and had previously undergone an occipital craniotomy for removal of lesions in the past.  She recently had a stable appearance of meningeal lesions in the left middle cranial fossa.  She has a hx of schizophrenia and OSA but not on CPAP after at trial with no improvement.    Initial ER evaluation included a CT of the head which showed extensive left sided vasogenic edema involving the external capsule extending to the temporal lobe, parietal lobe with associated mass-effect with compression of the left parietal ventricle, 31mm left to right midline shift. She was treated with IV decadron and evaluated by NSGY. 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 Post procedure, she failed extubation and required re-intubation.  She was returned to ICU on mechanical ventilation.     PCCM consulted for post-operative ICU care.   SUBJECTIVE:   Not on any sedation currently Tolerating pressure support 8 Has not interacted with family or nursing  VITAL SIGNS: BP (!) 145/95 (BP Location: Left Arm)   Pulse 61   Temp 97.7 F (36.5 C) (Axillary)   Resp 16   Ht 5\' 6"  (1.676 m)   Wt 53 kg (116 lb 13.5 oz)   SpO2 100%   BMI 18.86 kg/m   HEMODYNAMICS:    VENTILATOR SETTINGS: Vent Mode: PRVC FiO2 (%):  [40 %-100 %] 40 % Set Rate:  [16 bmp] 16 bmp Vt Set:  [480 mL] 480 mL PEEP:  [5 cmH20] 5 cmH20 Pressure Support:  [5 cmH20] 5 cmH20 Plateau Pressure:  [14 cmH20-16  cmH20] 16 cmH20  INTAKE / OUTPUT: I/O last 3 completed shifts: In: 2773 [I.V.:2263; IV Piggyback:510] Out: 1935 [Urine:1735; Blood:200]  PHYSICAL EXAMINATION: General: Ill-appearing woman, no distress, mechanically ventilated Neuro: Unresponsive to voice or pain HEENT: Endotracheal tube in place Cardiovascular: Regular, no murmur Lungs: Clear bilaterally Abdomen: Soft, nontender, positive bowel sounds Musculoskeletal: No deformity Skin: No rash  LABS:  BMET Recent Labs  Lab 04/22/17 0340 04/23/17 0429 04/24/17 0454  NA 146* 143 141  K 3.1* 3.1* 3.2*  CL 110 109 110  CO2 28 28 21*  BUN 14 12 13   CREATININE 0.57 0.60 0.53  GLUCOSE 124* 93 106*    Electrolytes Recent Labs  Lab 04/20/17 0457  04/22/17 0340 04/23/17 0429 04/23/17 1952 04/24/17 0454  CALCIUM 11.3*   < > 10.8* 10.0  --  9.2  MG 2.0  --  2.1 2.0  --  2.0  PHOS 3.0  --   --   --  2.0* 1.5*   < > = values in this interval not displayed.    CBC Recent Labs  Lab 04/22/17 0340 04/23/17 0429 04/24/17 0454  WBC 11.7* 9.0 18.1*  HGB 14.4 14.0 13.5  HCT 42.9 41.7 39.4  PLT 158 145* 132*    Coag's Recent Labs  Lab 04/19/17 1237  APTT 27  INR 1.01    Sepsis Markers  No results for input(s): LATICACIDVEN, PROCALCITON, O2SATVEN in the last 168 hours.  ABG Recent Labs  Lab 04/23/17 1500  PHART 7.468*  PCO2ART 35.0  PO2ART 479.0*    Liver Enzymes Recent Labs  Lab 04/20/17 0457 04/21/17 0359 04/22/17 0340  AST 14* 18 54*  ALT 13* 15 47  ALKPHOS 52 54 49  BILITOT 0.7 0.5 0.7  ALBUMIN 3.0* 3.2* 2.9*    Cardiac Enzymes No results for input(s): TROPONINI, PROBNP in the last 168 hours.  Glucose Recent Labs  Lab 04/23/17 1047 04/23/17 1542 04/23/17 2018 04/23/17 2337 04/24/17 0343 04/24/17 0738  GLUCAP 82 106* 159* 158* 125* 86    Imaging Ct Head Wo Contrast  Result Date: 04/24/2017 CLINICAL DATA:  Status post tumor resection. EXAM: CT HEAD WITHOUT CONTRAST TECHNIQUE:  Contiguous axial images were obtained from the base of the skull through the vertex without intravenous contrast. COMPARISON:  Brain MRI 04/19/2017 and head CT 04/19/2016 FINDINGS: Brain: Status post resection of lower left convexity mass. There is moderate volume pneumocephalus at the frontal poles and along the left convexity. There is mixed intra-axial and extra-axial blood at the resection site (predominantly extra-axial). Slightly decreased edema in the left hemisphere. Persistent mass effect on the left lateral ventricle. At the level of the foramina of Monro, there is approximately 4 mm of rightward midline shift, unchanged. Vascular: No hyperdense vessel or unexpected vascular calcification. Skull: Status post left pterional craniotomy with overlying soft tissue swelling and skin staples. Remote suboccipital craniectomy. Sinuses/Orbits: No sinus fluid levels or advanced mucosal thickening. No mastoid effusion. Normal orbits. IMPRESSION: 1. Status post resection of lower left convexity tumor with postoperative pneumocephalus and mixed intra-axial/extra-axial blood at the resection site. 2. Unchanged 3-4 mm rightward midline shift. 3. Moderate volume pneumocephalus with flattening of the frontal poles. Attention on follow-up to exclude tension pneumocephalus. Electronically Signed   By: Ulyses Jarred M.D.   On: 04/24/2017 00:59   Portable Chest Xray  Result Date: 04/24/2017 CLINICAL DATA:  Respiratory insufficiency. EXAM: PORTABLE CHEST 1 VIEW COMPARISON:  04/23/2017. FINDINGS: Unchanged endotracheal tube. Normal cardiomediastinal silhouette. Clear lung fields. No pneumothorax or effusion. IMPRESSION: No active disease. Electronically Signed   By: Staci Righter M.D.   On: 04/24/2017 08:57   Portable Chest X-ray  Result Date: 04/23/2017 CLINICAL DATA:  Endotracheal tube placement. EXAM: PORTABLE CHEST 1 VIEW COMPARISON:  Radiograph of September 17, 2015. FINDINGS: The heart size and mediastinal contours are  within normal limits. Endotracheal tube is seen projected over tracheal air shadow with distal tip 4 cm above the carina. Atherosclerosis of thoracic aorta is noted. No pneumothorax or pleural effusion is noted. Both lungs are clear. Minimally displaced right posterior fourth rib fracture is noted. IMPRESSION: Endotracheal tube in grossly good position. Probable minimally displaced right posterior fourth rib fracture. No other cardiopulmonary abnormality seen. Aortic Atherosclerosis (ICD10-I70.0). Electronically Signed   By: Marijo Conception, M.D.   On: 04/23/2017 14:36     STUDIES:  CT Head 3/11 >> Extensive left-sided vasogenic edema involving external capsule, left-sided centrum semi ovale, extending into the temporal lobe and parietal lobe. There is associated mass effect with compression of the left lateral ventricle and 3 mm of left-to-right midline shift at the foramen of Monro. Indistinctness of the left-sided sulci may be secondary to mass effect, however, new metastatic disease or manifestation of the patient's known histiocytosis may be the underlying etiology  CULTURES:   ANTIBIOTICS: Cefazolin 3/15 >>   SIGNIFICANT EVENTS: 3/11 Admit with  progressive weakness, edema on CT head  3/15 Crani for resection of tumor, failed extubation   LINES/TUBES: ETT 3/15 >>   DISCUSSION: 67 y/o F admitted 3/11 with increasing weakness.  Known hx of Rosai Dorfman with previously stable CT head.  On admit found to have new edema with midline shift.  To OR 3/15 for left craniotomy for resection of tumor.   ASSESSMENT / PLAN:  PULMONARY A: Acute Respiratory Insufficiency  OSA - not on CPAP  4th Rib Fracture - noted on CXR  P:   Continue current ventilator support.  Pressure support okay but not a candidate for extubation due to mental status Wean PEEP / FiO2 for sats > 90% Follow intermittent chest x-ray  CARDIOVASCULAR A:  Hx PAF, HTN, HLD  P:  SBP goal less than 170 per  neurosurgery Metoprolol scheduled every 6 hours Labetalol and hydralazine if needed for blood pressure control No anticoagulation  RENAL A:   Hypokalemia  Anion gap metabolic acidosis P:   Follow BMP, urine output Check lactate now Replace electrolyte as indicated (potassium now)  GASTROINTESTINAL A:   At Risk Malnutrition  P:   Tube feeding Protonix for acid prophylaxis  HEMATOLOGIC A:   P:  Follow CBC SCD in place  INFECTIOUS A:   Post-Operative Crani  P:   Monitor surgical site Cefazolin per neurosurgery  ENDOCRINE A:   DM  P:    Sliding scale insulin per protocol Continue Lantus as ordered  NEUROLOGIC A:   Left Craniotomy s/p Tumor Removal  Cerebral Edema with Midline Shift  Rosai Dorfman Syndrome  P:   RASS goal: 0 to -1  Versed and fentanyl available if needed, currently looks comfortable and is unresponsive Continue Keppra as ordered Serial neurological exams Postoperative care per neurosurgery recommendations Seizure precautions   FAMILY  - Updates: Updated husband and son at bedside 3/16  - Inter-disciplinary family meet or Palliative Care meeting due by:  3/22  Independent critical care time 32 minutes  Baltazar Apo, MD, PhD 04/24/2017, 10:29 AM [facility] [phone] or if no answer [phone]

## 2017-04-24 NOTE — Progress Notes (Signed)
[facility] ADULT ICU REPLACEMENT PROTOCOL FOR AM LAB REPLACEMENT ONLY  The patient does apply for the [facility] Adult ICU Electrolyte Replacment Protocol based on the criteria listed below:   1. Is GFR >/= 40 ml/min? Yes.    Patient's GFR today I>60 2. Is urine output >/= 0.5 ml/kg/hr for the last 6 hours? Yes.   Patient's UOP is 0.53 ml/kg/hr 3. Is BUN < 60 mg/dL? Yes.    Patient's BUN today 13 4. Abnormal electrolyte(s)K+=3.2 5. Ordered repletion with: per protocol 6. If a panic level lab has been reported, has the CCM MD in charge been notified? Yes.  .   Physician:  Dr. Keenan Bachelor, [place] Presume 04/24/2017 5:47 AM

## 2017-04-24 NOTE — Progress Notes (Signed)
Patient had 2 episodes of tremors throughout the night. No complications to report from the incisional site. Patient's potassium being replaced due to an AM K+ level of 3.2.  Nurse will continue to monitor.

## 2017-04-24 NOTE — Progress Notes (Signed)
SLP Cancellation Note  Patient Details Name: Rachel [name] MRN: [id number] DOB: February 19, 1950   Cancelled treatment:       Reason Eval/Treat Not Completed: Medical issues which prohibited therapy. Pt remains intubated after surgery.  Deneise Lever, [place], [place] Speech-Language Pathologist [place] 04/24/2017, 11:15 AM

## 2017-04-25 DIAGNOSIS — J9602 Acute respiratory failure with hypercapnia: Secondary | ICD-10-CM

## 2017-04-25 LAB — POCT I-STAT 3, ART BLOOD GAS (G3+)
Acid-Base Excess: 2 mmol/L (ref 0.0–2.0)
BICARBONATE: 25 mmol/L (ref 20.0–28.0)
O2 SAT: 100 %
TCO2: 26 mmol/L (ref 22–32)
pCO2 arterial: 31.7 mmHg — ABNORMAL LOW (ref 32.0–48.0)
pH, Arterial: 7.505 — ABNORMAL HIGH (ref 7.350–7.450)
pO2, Arterial: 211 mmHg — ABNORMAL HIGH (ref 83.0–108.0)

## 2017-04-25 LAB — GLUCOSE, CAPILLARY
GLUCOSE-CAPILLARY: 178 mg/dL — AB (ref 65–99)
Glucose-Capillary: 167 mg/dL — ABNORMAL HIGH (ref 65–99)
Glucose-Capillary: 188 mg/dL — ABNORMAL HIGH (ref 65–99)
Glucose-Capillary: 191 mg/dL — ABNORMAL HIGH (ref 65–99)
Glucose-Capillary: 235 mg/dL — ABNORMAL HIGH (ref 65–99)
Glucose-Capillary: 237 mg/dL — ABNORMAL HIGH (ref 65–99)
Glucose-Capillary: 257 mg/dL — ABNORMAL HIGH (ref 65–99)

## 2017-04-25 LAB — CBC WITH DIFFERENTIAL/PLATELET
Basophils Absolute: 0 10*3/uL (ref 0.0–0.1)
Basophils Relative: 0 %
EOS ABS: 0 10*3/uL (ref 0.0–0.7)
EOS PCT: 0 %
HCT: 33.2 % — ABNORMAL LOW (ref 36.0–46.0)
Hemoglobin: 11.4 g/dL — ABNORMAL LOW (ref 12.0–15.0)
LYMPHS ABS: 0.8 10*3/uL (ref 0.7–4.0)
LYMPHS PCT: 5 %
MCH: 30.6 pg (ref 26.0–34.0)
MCHC: 34.3 g/dL (ref 30.0–36.0)
MCV: 89.2 fL (ref 78.0–100.0)
MONO ABS: 1.7 10*3/uL — AB (ref 0.1–1.0)
MONOS PCT: 11 %
Neutro Abs: 13.5 10*3/uL — ABNORMAL HIGH (ref 1.7–7.7)
Neutrophils Relative %: 85 %
PLATELETS: 91 10*3/uL — AB (ref 150–400)
RBC: 3.72 MIL/uL — AB (ref 3.87–5.11)
RDW: 13 % (ref 11.5–15.5)
WBC: 16 10*3/uL — ABNORMAL HIGH (ref 4.0–10.5)

## 2017-04-25 LAB — BASIC METABOLIC PANEL
ANION GAP: 5 (ref 5–15)
BUN: 16 mg/dL (ref 6–20)
CO2: 23 mmol/L (ref 22–32)
Calcium: 8.8 mg/dL — ABNORMAL LOW (ref 8.9–10.3)
Chloride: 110 mmol/L (ref 101–111)
Creatinine, Ser: 0.47 mg/dL (ref 0.44–1.00)
GFR calc Af Amer: >60 mL/min (ref >60)
Glucose, Bld: 245 mg/dL — ABNORMAL HIGH (ref 65–99)
POTASSIUM: 3.4 mmol/L — AB (ref 3.5–5.1)
SODIUM: 138 mmol/L (ref 135–145)

## 2017-04-25 LAB — PROCALCITONIN: Procalcitonin: <0.1 ng/mL

## 2017-04-25 LAB — LACTIC ACID, PLASMA: LACTIC ACID, VENOUS: 1.2 mmol/L (ref 0.5–1.9)

## 2017-04-25 LAB — MAGNESIUM: Magnesium: 1.9 mg/dL (ref 1.7–2.4)

## 2017-04-25 LAB — PHOSPHORUS: PHOSPHORUS: 1.2 mg/dL — AB (ref 2.5–4.6)

## 2017-04-25 NOTE — Progress Notes (Signed)
Patient remained unchanged throughout the night. Hemodynamically stable

## 2017-04-25 NOTE — Progress Notes (Signed)
PULMONARY / CRITICAL CARE MEDICINE   Name: Rachel [name] MRN: [id number] DOB: March 26, 1950    ADMISSION DATE:  04/19/2017 CONSULTATION DATE:  04/23/17  REFERRING MD:  Dr. Trenton Gammon   CHIEF COMPLAINT:  Respiratory Insufficiency   HISTORY OF PRESENT ILLNESS:  67 y/o F who presented 3/11 with a 2 day history of worsening right sided weakness.    At baseline, she is dependent for all ADL's. She required assistance to walk, has to be fed.  She has a history of Rosai Dorfman and had previously undergone an occipital craniotomy for removal of lesions in the past.  She recently had a stable appearance of meningeal lesions in the left middle cranial fossa.  She has a hx of schizophrenia and OSA but not on CPAP after at trial with no improvement.    Initial ER evaluation included a CT of the head which showed extensive left sided vasogenic edema involving the external capsule extending to the temporal lobe, parietal lobe with associated mass-effect with compression of the left parietal ventricle, 39mm left to right midline shift. She was treated with IV decadron and evaluated by NSGY. 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 Post procedure, she failed extubation and required re-intubation.  She was returned to ICU on mechanical ventilation.     PCCM consulted for post-operative ICU care.   SUBJECTIVE:   This is postoperative day 2, she is not on any sedation currently, and still not at all interactive. He remains intubated and mechanically ventilated.  VITAL SIGNS: BP (!) 151/93   Pulse 60   Temp (!) 97.4 F (36.3 C) (Axillary)   Resp 16   Ht 5\' 6"  (1.676 m)   Wt 118 lb 13.3 oz (53.9 kg)   SpO2 100%   BMI 19.18 kg/m   HEMODYNAMICS:    VENTILATOR SETTINGS: Vent Mode: PRVC FiO2 (%):  [40 %] 40 % Set Rate:  [16 bmp] 16 bmp Vt Set:  [480 mL] 480 mL PEEP:  [5 cmH20] 5 cmH20 Plateau Pressure:  [12 cmH20-14 cmH20] 13  cmH20  INTAKE / OUTPUT: I/O last 3 completed shifts: In: 3082.4 [I.V.:1800; NG/GT:682.4; IV Piggyback:600] Out: 7793 [Urine:1045; Emesis/NG output:100]  PHYSICAL EXAMINATION: General: Ill-appearing woman, in no distress, orally intubated and mechanically ventilated  Neuro: No response to voice, moves left extremities to noxious stimuli.  Pupils are equal. HEENT: Endotracheal tube in place Cardiovascular: The PMI is somewhat hyperdynamic, S1 and S2 are regular without murmur rub or gallop Lungs: Aspirations are unlabored, there is symmetric air movement, some scattered rhonchi, no wheezes. Abdomen: Soft, nontender, positive bowel sounds Musculoskeletal: No deformity Skin: No rash  LABS:  BMET Recent Labs  Lab 04/23/17 0429 04/24/17 0454 04/25/17 0501  NA 143 141 138  K 3.1* 3.2* 3.4*  CL 109 110 110  CO2 28 21* 23  BUN 12 13 16   CREATININE 0.60 0.53 0.47  GLUCOSE 93 106* 245*    Electrolytes Recent Labs  Lab 04/23/17 0429 04/23/17 1952 04/24/17 0454 04/25/17 0501  CALCIUM 10.0  --  9.2 8.8*  MG 2.0  --  2.0 1.9  PHOS  --  2.0* 1.5* 1.2*    CBC Recent Labs  Lab 04/22/17 0340 04/23/17 0429 04/24/17 0454  WBC 11.7* 9.0 18.1*  HGB 14.4 14.0 13.5  HCT 42.9 41.7 39.4  PLT 158 145* 132*    Coag's Recent Labs  Lab 04/19/17 1237  APTT 27  INR 1.01    Sepsis Markers Recent Labs  Lab 04/24/17 1041 04/25/17 0501  LATICACIDVEN 1.0 1.2    ABG Recent Labs  Lab 04/23/17 1500  PHART 7.468*  PCO2ART 35.0  PO2ART 479.0*    Liver Enzymes Recent Labs  Lab 04/20/17 0457 04/21/17 0359 04/22/17 0340  AST 14* 18 54*  ALT 13* 15 47  ALKPHOS 52 54 49  BILITOT 0.7 0.5 0.7  ALBUMIN 3.0* 3.2* 2.9*    Cardiac Enzymes No results for input(s): TROPONINI, PROBNP in the last 168 hours.  Glucose Recent Labs  Lab 04/24/17 1556 04/24/17 2012 04/25/17 0023 04/25/17 0346 04/25/17 0811 04/25/17 1209  GLUCAP 137* 175* 178* 167* 237* 191*     Imaging No results found.   STUDIES:  CT Head 3/11 >> Extensive left-sided vasogenic edema involving external capsule, left-sided centrum semi ovale, extending into the temporal lobe and parietal lobe. There is associated mass effect with compression of the left lateral ventricle and 3 mm of left-to-right midline shift at the foramen of Monro. Indistinctness of the left-sided sulci may be secondary to mass effect, however, new metastatic disease or manifestation of the patient's known histiocytosis may be the underlying etiology  CULTURES:   ANTIBIOTICS: Cefazolin 3/15 >>   SIGNIFICANT EVENTS: 3/11 Admit with progressive weakness, edema on CT head  3/15 Crani for resection of tumor, failed extubation   LINES/TUBES: ETT 3/15 >>   DISCUSSION: 67 y/o F admitted 3/11 with increasing weakness.  Known hx of Rosai Dorfman with previously stable CT head.  On admit found to have new edema with midline shift.  To OR 3/15 for left craniotomy for resection of tumor.  Mental status remains poor postoperatively precluding extubation.  ASSESSMENT / PLAN:  PULMONARY A: Acute Respiratory Insufficiency  OSA - not on CPAP  4th Rib Fracture - noted on CXR  P:   Continue current ventilator support, as I try to sort out why her mental status is not significantly improved. Wean PEEP / FiO2 for sats > 90% Follow intermittent chest x-ray  CARDIOVASCULAR A:  Hx PAF, HTN, HLD  P:  SBP goal less than 170 per neurosurgery Metoprolol scheduled every 6 hours Labetalol and hydralazine if needed for blood pressure control No anticoagulation  RENAL A:   Hypokalemia  Anion gap metabolic acidosis P:   Follow BMP, urine output Check lactate now Replace electrolyte as indicated (potassium now)  GASTROINTESTINAL A:   At Risk Malnutrition  P:   Tube feeding Protonix for acid prophylaxis  HEMATOLOGIC A:   P:  Follow CBC SCD in place  INFECTIOUS A:   Post-Operative Crani  P:    Monitor surgical site Cefazolin per neurosurgery  ENDOCRINE A:   DM  P:    Sliding scale insulin per protocol Continue Lantus as ordered.  We may need to intensify therapy with a combination of tube feeding and Decadron.  NEUROLOGIC A:   Left Craniotomy s/p Tumor Removal  Cerebral Edema with Midline Shift  Rosai Dorfman Syndrome  P:   Mental status remains very poor it is not accounted for by the follow-up CT scan.  The patient's mental status is reportedly declined insidiously preoperatively and chronic metabolic as well as acute metabolic insults are to be considered.  A TSH, liver function studies, ionized calcium, and blood gas have been ordered.  A CBC and pro-calcitonin are pending considering possible concurrent infection.  Ordered an EEG to rule out subclinical seizures accounting for her poor mental status.  Greater than 32 minutes was spent in the care of this patient today who has life-threatening illness and still requires mechanical ventilatory support  Lars Masson, MD 04/25/2017, 2:37 PM [place] [facility] [phone] or if no answer [phone]

## 2017-04-25 NOTE — Progress Notes (Signed)
SLP Cancellation Note  Patient Details Name: Rachel [name] MRN: [id number] DOB: [birth year]   Cancelled treatment:       Reason Eval/Treat Not Completed: Medical issues which prohibited therapy. Per charting, pt remains intubated; cortrak in place. Will follow up when medically appropriate.  Deneise Lever, [place], [place] Speech-Language Pathologist [place] 04/25/2017, 7:41 AM

## 2017-04-25 NOTE — Progress Notes (Signed)
Subjective: Patient reports remains intubated and nonresponsive  Objective: Vital signs in last 24 hours: Temp:  [97.7 F (36.5 C)-99.4 F (37.4 C)] 99 F (37.2 C) (03/17 0400) Pulse Rate:  [57-76] 63 (03/17 0325) Resp:  [14-18] 16 (03/17 0325) BP: (113-151)/(74-106) 128/80 (03/17 0200) SpO2:  [100 %] 100 % (03/17 0325) Arterial Line BP: (122-186)/(66-100) 145/81 (03/17 0300) FiO2 (%):  [40 %] 40 % (03/17 0325) Weight:  [53.9 kg (118 lb 13.3 oz)] 53.9 kg (118 lb 13.3 oz) (03/17 0500)  Intake/Output from previous day: 03/16 0701 - 03/17 0700 In: 2092.4 [I.V.:1100; NG/GT:592.4; IV Piggyback:400] Out: 780 [Urine:680; Emesis/NG output:100] Intake/Output this shift: Total I/O In: 1075 [I.V.:550; NG/GT:425; IV Piggyback:100] Out: 400 [Urine:400]  withdraws left upper and lower extremity to severe noxious stimulation pupil still pinpoint but reactive  Lab Results: Recent Labs    04/23/17 0429 04/24/17 0454  WBC 9.0 18.1*  HGB 14.0 13.5  HCT 41.7 39.4  PLT 145* 132*   BMET Recent Labs    04/24/17 0454 04/25/17 0501  NA 141 138  K 3.2* 3.4*  CL 110 110  CO2 21* 23  GLUCOSE 106* 245*  BUN 13 16  CREATININE 0.53 0.47  CALCIUM 9.2 8.8*    Studies/Results: Ct Head Wo Contrast  Result Date: 04/24/2017 CLINICAL DATA:  Status post tumor resection. EXAM: CT HEAD WITHOUT CONTRAST TECHNIQUE: Contiguous axial images were obtained from the base of the skull through the vertex without intravenous contrast. COMPARISON:  Brain MRI 04/19/2017 and head CT 04/19/2016 FINDINGS: Brain: Status post resection of lower left convexity mass. There is moderate volume pneumocephalus at the frontal poles and along the left convexity. There is mixed intra-axial and extra-axial blood at the resection site (predominantly extra-axial). Slightly decreased edema in the left hemisphere. Persistent mass effect on the left lateral ventricle. At the level of the foramina of Monro, there is approximately 4  mm of rightward midline shift, unchanged. Vascular: No hyperdense vessel or unexpected vascular calcification. Skull: Status post left pterional craniotomy with overlying soft tissue swelling and skin staples. Remote suboccipital craniectomy. Sinuses/Orbits: No sinus fluid levels or advanced mucosal thickening. No mastoid effusion. Normal orbits. IMPRESSION: 1. Status post resection of lower left convexity tumor with postoperative pneumocephalus and mixed intra-axial/extra-axial blood at the resection site. 2. Unchanged 3-4 mm rightward midline shift. 3. Moderate volume pneumocephalus with flattening of the frontal poles. Attention on follow-up to exclude tension pneumocephalus. Electronically Signed   By: Ulyses Jarred M.D.   On: 04/24/2017 00:59   Portable Chest Xray  Result Date: 04/24/2017 CLINICAL DATA:  Respiratory insufficiency. EXAM: PORTABLE CHEST 1 VIEW COMPARISON:  04/23/2017. FINDINGS: Unchanged endotracheal tube. Normal cardiomediastinal silhouette. Clear lung fields. No pneumothorax or effusion. IMPRESSION: No active disease. Electronically Signed   By: Staci Righter M.D.   On: 04/24/2017 08:57   Portable Chest X-ray  Result Date: 04/23/2017 CLINICAL DATA:  Endotracheal tube placement. EXAM: PORTABLE CHEST 1 VIEW COMPARISON:  Radiograph of September 17, 2015. FINDINGS: The heart size and mediastinal contours are within normal limits. Endotracheal tube is seen projected over tracheal air shadow with distal tip 4 cm above the carina. Atherosclerosis of thoracic aorta is noted. No pneumothorax or pleural effusion is noted. Both lungs are clear. Minimally displaced right posterior fourth rib fracture is noted. IMPRESSION: Endotracheal tube in grossly good position. Probable minimally displaced right posterior fourth rib fracture. No other cardiopulmonary abnormality seen. Aortic Atherosclerosis (ICD10-I70.0). Electronically Signed   By: Marijo Conception, M.D.  On: 04/23/2017 14:36     Assessment/Plan: Encephalopathy continues.CT scan with no acute changes. Normal postoperative appearance. Some pneumocephalus. Continue observation wean vent as tolerated repeat CT head in the morning  LOS: 6 days     Rachel [name] P 04/25/2017, 5:56 AM

## 2017-04-26 ENCOUNTER — Inpatient Hospital Stay (HOSPITAL_COMMUNITY): Payer: BLUE CROSS/BLUE SHIELD

## 2017-04-26 DIAGNOSIS — G936 Cerebral edema: Secondary | ICD-10-CM

## 2017-04-26 LAB — POCT I-STAT 3, ART BLOOD GAS (G3+)
ACID-BASE EXCESS: 4 mmol/L — AB (ref 0.0–2.0)
BICARBONATE: 26.6 mmol/L (ref 20.0–28.0)
O2 SAT: 100 %
PO2 ART: 211 mmHg — AB (ref 83.0–108.0)
TCO2: 28 mmol/L (ref 22–32)
pCO2 arterial: 32.8 mmHg (ref 32.0–48.0)
pH, Arterial: 7.516 — ABNORMAL HIGH (ref 7.350–7.450)

## 2017-04-26 LAB — COMPREHENSIVE METABOLIC PANEL
ALK PHOS: 49 U/L (ref 38–126)
ALT: 44 U/L (ref 14–54)
AST: 26 U/L (ref 15–41)
Albumin: 2 g/dL — ABNORMAL LOW (ref 3.5–5.0)
Anion gap: 5 (ref 5–15)
BILIRUBIN TOTAL: 0.7 mg/dL (ref 0.3–1.2)
BUN: 18 mg/dL (ref 6–20)
CO2: 24 mmol/L (ref 22–32)
CREATININE: 0.49 mg/dL (ref 0.44–1.00)
Calcium: 8.7 mg/dL — ABNORMAL LOW (ref 8.9–10.3)
Chloride: 107 mmol/L (ref 101–111)
Glucose, Bld: 243 mg/dL — ABNORMAL HIGH (ref 65–99)
Potassium: 3.2 mmol/L — ABNORMAL LOW (ref 3.5–5.1)
Sodium: 136 mmol/L (ref 135–145)
TOTAL PROTEIN: 4.1 g/dL — AB (ref 6.5–8.1)

## 2017-04-26 LAB — CBC WITH DIFFERENTIAL/PLATELET
BASOS PCT: 0 %
Basophils Absolute: 0 10*3/uL (ref 0.0–0.1)
Eosinophils Absolute: 0 10*3/uL (ref 0.0–0.7)
Eosinophils Relative: 0 %
HCT: 30.7 % — ABNORMAL LOW (ref 36.0–46.0)
HEMOGLOBIN: 10.5 g/dL — AB (ref 12.0–15.0)
Lymphocytes Relative: 8 %
Lymphs Abs: 1 10*3/uL (ref 0.7–4.0)
MCH: 30.3 pg (ref 26.0–34.0)
MCHC: 34.2 g/dL (ref 30.0–36.0)
MCV: 88.7 fL (ref 78.0–100.0)
MONOS PCT: 8 %
Monocytes Absolute: 1.1 10*3/uL — ABNORMAL HIGH (ref 0.1–1.0)
NEUTROS PCT: 84 %
Neutro Abs: 11.2 10*3/uL — ABNORMAL HIGH (ref 1.7–7.7)
Platelets: 100 10*3/uL — ABNORMAL LOW (ref 150–400)
RBC: 3.46 MIL/uL — ABNORMAL LOW (ref 3.87–5.11)
RDW: 12.8 % (ref 11.5–15.5)
WBC: 13.4 10*3/uL — AB (ref 4.0–10.5)

## 2017-04-26 LAB — POCT I-STAT 7, (LYTES, BLD GAS, ICA,H+H)
ACID-BASE EXCESS: 2 mmol/L (ref 0.0–2.0)
Bicarbonate: 27.9 mmol/L (ref 20.0–28.0)
CALCIUM ION: 1.32 mmol/L (ref 1.15–1.40)
HEMATOCRIT: 32 % — AB (ref 36.0–46.0)
HEMOGLOBIN: 10.9 g/dL — AB (ref 12.0–15.0)
O2 SAT: 100 %
POTASSIUM: 2.4 mmol/L — AB (ref 3.5–5.1)
SODIUM: 141 mmol/L (ref 135–145)
TCO2: 29 mmol/L (ref 22–32)
pCO2 arterial: 49 mmHg — ABNORMAL HIGH (ref 32.0–48.0)
pH, Arterial: 7.363 (ref 7.350–7.450)
pO2, Arterial: 323 mmHg — ABNORMAL HIGH (ref 83.0–108.0)

## 2017-04-26 LAB — GLUCOSE, CAPILLARY
GLUCOSE-CAPILLARY: 211 mg/dL — AB (ref 65–99)
GLUCOSE-CAPILLARY: 196 mg/dL — AB (ref 65–99)
GLUCOSE-CAPILLARY: 215 mg/dL — AB (ref 65–99)
GLUCOSE-CAPILLARY: 138 mg/dL — AB (ref 65–99)
GLUCOSE-CAPILLARY: 141 mg/dL — AB (ref 65–99)
GLUCOSE-CAPILLARY: 142 mg/dL — AB (ref 65–99)
Glucose-Capillary: 237 mg/dL — ABNORMAL HIGH (ref 65–99)
Glucose-Capillary: 227 mg/dL — ABNORMAL HIGH (ref 65–99)
Glucose-Capillary: 202 mg/dL — ABNORMAL HIGH (ref 65–99)
Glucose-Capillary: 186 mg/dL — ABNORMAL HIGH (ref 65–99)
Glucose-Capillary: 166 mg/dL — ABNORMAL HIGH (ref 65–99)

## 2017-04-26 LAB — MAGNESIUM: Magnesium: 1.8 mg/dL (ref 1.7–2.4)

## 2017-04-26 LAB — PROCALCITONIN: Procalcitonin: <0.1 ng/mL

## 2017-04-26 MED ORDER — POTASSIUM CHLORIDE 20 MEQ/15ML (10%) PO SOLN
40.0000 meq | Freq: Two times a day (BID) | ORAL | Status: AC
  Administered 2017-04-26 (×2): 40 meq
  Filled 2017-04-26 (×2): qty 30

## 2017-04-26 MED ORDER — SODIUM CHLORIDE 0.9 % IV SOLN
INTRAVENOUS | Status: DC
  Administered 2017-04-26: 1.6 [IU]/h via INTRAVENOUS
  Filled 2017-04-26: qty 1

## 2017-04-26 MED ORDER — INSULIN ASPART 100 UNIT/ML ~~LOC~~ SOLN
2.0000 [IU] | SUBCUTANEOUS | Status: DC
  Administered 2017-04-26 (×2): 6 [IU] via SUBCUTANEOUS

## 2017-04-26 MED FILL — Thrombin For Soln 5000 Unit: CUTANEOUS | Qty: 5000 | Status: AC

## 2017-04-26 MED FILL — Thrombin For Soln 20000 Unit: CUTANEOUS | Qty: 1 | Status: AC

## 2017-04-26 NOTE — Progress Notes (Signed)
PT Cancellation Note  Patient Details Name: Rachel [name] MRN: [id number] DOB: 1951/01/25   Cancelled Treatment:    Reason Eval/Treat Not Completed: Patient not medically ready(pt intubated, non responsive and without voluntary movement prior to intubation. Will sign off and await new order)   Briaunna Grindstaff B Savahna Casados 04/26/2017, 8:09 AM  Elwyn Reach, [employer]

## 2017-04-26 NOTE — Progress Notes (Signed)
EEG completed; results pending.    

## 2017-04-26 NOTE — Progress Notes (Signed)
Patient transported on vent to CT and back to 3A-25 without complication.

## 2017-04-26 NOTE — Progress Notes (Addendum)
OT Cancellation    04/26/17 0800  OT Visit Information  Last OT Received On 04/26/17  Reason Eval/Treat Not Completed Patient not medically ready. Pt intubated and with decreased arousal. Will sign off and await till new orders and medically appropriate. Thank you.   [place], OTR/L Acute Rehab Pager: [phone] Office: [phone]

## 2017-04-26 NOTE — Progress Notes (Signed)
PULMONARY / CRITICAL CARE MEDICINE   Name: Rachel [name] MRN: [id number] DOB: Jul 12, 1950    ADMISSION DATE:  04/19/2017 CONSULTATION DATE:  04/23/17  REFERRING MD:  Dr. Trenton Gammon   CHIEF COMPLAINT:  Respiratory Insufficiency   HISTORY OF PRESENT ILLNESS:   67 y/o F who presented 3/11 with a 2 day history of worsening right sided weakness.    At baseline, she is dependent for all ADL's. She required assistance to walk, has to be fed.  She has a history of Rosai Dorfman and had previously undergone an occipital craniotomy for removal of lesions in the past.  She recently had a stable appearance of meningeal lesions in the left middle cranial fossa.  She has a hx of schizophrenia and OSA but not on CPAP after at trial with no improvement.    Initial ER evaluation included a CT of the head which showed extensive left sided vasogenic edema involving the external capsule extending to the temporal lobe, parietal lobe with associated mass-effect with compression of the left parietal ventricle, 24mm left to right midline shift. She was treated with IV decadron and evaluated by NSGY. 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 Post procedure, she failed extubation and required re-intubation.  She was returned to ICU on mechanical ventilation.     PCCM consulted for post-operative ICU care.   SUBJECTIVE:   POD 3 CTH overnight with slight improvement in vasogenic edema and decreasing postop pneumocephalus Remains off sedation Failed SBT this am x 2 secondary to apnea RN reports will intermittently respond to painful stimuli  Spot EEG pending   VITAL SIGNS: BP 100/65   Pulse (!) 47   Temp 97.6 F (36.4 C) (Axillary)   Resp 16   Ht 5\' 6"  (1.676 m)   Wt 119 lb 11.4 oz (54.3 kg)   SpO2 100%   BMI 19.32 kg/m   HEMODYNAMICS:    VENTILATOR SETTINGS: Vent Mode: PRVC FiO2 (%):  [40 %] 40 % Set Rate:  [16 bmp] 16 bmp Vt Set:   [480 mL] 480 mL PEEP:  [5 cmH20] 5 cmH20 Plateau Pressure:  [13 cmH20-17 cmH20] 13 cmH20  INTAKE / OUTPUT: I/O last 3 completed shifts: In: 3917.8 [I.V.:1850; NG/GT:1767.8; IV Piggyback:300] Out: 1285 [Urine:1285]  PHYSICAL EXAMINATION: General:  Critically ill appearing female on MV in NAD HEENT: MM pink/moist, ETT at 20 at lip/ right cortrak, pupils 2-3, reactive, left gaze, crani site dry/ staples intact Neuro:  Obtunded, does not f/c, withdrawals to pain in all extremities  CV:  RR, SB, 2+ pulses PULM: even/non-labored on MV, lungs bilaterally CTA, PSV 15/5 with no initiation of breath on my exam GI: soft, non-tender, bs active  Extremities: warm/dry, generalized mild edema  Skin: no rashes   LABS:  BMET Recent Labs  Lab 04/24/17 0454 04/25/17 0501 04/26/17 0437  NA 141 138 136  K 3.2* 3.4* 3.2*  CL 110 110 107  CO2 21* 23 24  BUN 13 16 18   CREATININE 0.53 0.47 0.49  GLUCOSE 106* 245* 243*    Electrolytes Recent Labs  Lab 04/23/17 0429 04/23/17 1952 04/24/17 0454 04/25/17 0501 04/26/17 0437  CALCIUM 10.0  --  9.2 8.8* 8.7*  MG 2.0  --  2.0 1.9  --   PHOS  --  2.0* 1.5* 1.2*  --     CBC Recent Labs  Lab 04/24/17 0454 04/25/17 1555 04/26/17 0437  WBC 18.1* 16.0*  13.4*  HGB 13.5 11.4* 10.5*  HCT 39.4 33.2* 30.7*  PLT 132* 91* 100*    Coag's Recent Labs  Lab 04/19/17 1237  APTT 27  INR 1.01    Sepsis Markers Recent Labs  Lab 04/24/17 1041 04/25/17 0501 04/25/17 1555 04/26/17 0437  LATICACIDVEN 1.0 1.2  --   --   PROCALCITON  --   --  <0.10 <0.10    ABG Recent Labs  Lab 04/23/17 0858 04/23/17 1500 04/25/17 1500  PHART 7.363 7.468* 7.505*  PCO2ART 49.0* 35.0 31.7*  PO2ART 323.0* 479.0* 211.0*    Liver Enzymes Recent Labs  Lab 04/21/17 0359 04/22/17 0340 04/26/17 0437  AST 18 54* 26  ALT 15 47 44  ALKPHOS 54 49 49  BILITOT 0.5 0.7 0.7  ALBUMIN 3.2* 2.9* 2.0*    Cardiac Enzymes No results for input(s): TROPONINI,  PROBNP in the last 168 hours.  Glucose Recent Labs  Lab 04/25/17 1209 04/25/17 1536 04/25/17 1952 04/25/17 2345 04/26/17 0346 04/26/17 0812  GLUCAP 191* 188* 235* 257* 211* 196*    Imaging Ct Head Wo Contrast  Result Date: 04/26/2017 CLINICAL DATA:  Follow-up examination status post brain tumor excision. EXAM: CT HEAD WITHOUT CONTRAST TECHNIQUE: Contiguous axial images were obtained from the base of the skull through the vertex without intravenous contrast. COMPARISON:  Prior CT from 04/24/2017. FINDINGS: Brain: Postoperative changes from recent left frontotemporal craniotomy for resection of left lower convexity dural-based mass. Persistent small volume postoperative pneumocephalus, improved relative to previous exam. Mixed intra-axial and extra-axial hemorrhage at the resection cavity is little interval changed. Persistent vasogenic edema within the left cerebral hemisphere appears slightly improved. Mild mass effect on the left lateral ventricle. 4 mm of left-to-right shift not significantly changed. No new hydrocephalus or ventricular trapping. Basilar cisterns remain patent. Otherwise stable appearance of the brain. No new intracranial hemorrhage. No acute large vessel territory infarct. Stable atrophy with chronic small vessel ischemic disease. Sequelae of prior suboccipital craniectomy. Vascular: No hyperdense vessel. Skull: Postoperative changes from previous left frontotemporal craniotomy. Skin staples remain in place. Sinuses/Orbits: Globes and orbital soft tissues within normal limits. Paranasal sinuses remain largely clear. Small left mastoid effusion. Other: None. IMPRESSION: 1. Postoperative changes from recent left frontotemporal craniotomy for tumor resection of left lower dural-based tumor. Decreasing postoperative pneumocephalus with similar blood products at the resection site. 2. Slightly improved left cerebral vasogenic edema with similar 3 mm of left-to-right shift. 3. No other  new acute intracranial abnormality. Electronically Signed   By: Jeannine Boga M.D.   On: 04/26/2017 02:29     STUDIES:  CT Head 3/11 >>  Extensive left-sided vasogenic edema involving external capsule, left-sided centrum semi ovale, extending into the temporal lobe and parietal lobe. There is associated mass effect with compression of the left lateral ventricle and 3 mm of left-to-right midline shift at the foramen of Monro. Indistinctness of the left-sided sulci may be secondary to mass effect, however, new metastatic disease or manifestation of the patient's known histiocytosis may be the underlying etiology  [facility] 3/16 >> 1. Status post resection of lower left convexity tumor with postoperative pneumocephalus and mixed intra-axial/extra-axial blood at the resection site. 2. Unchanged 3-4 mm rightward midline shift. 3. Moderate volume pneumocephalus with flattening of the frontal poles. Attention on follow-up to exclude tension pneumocephalus.  [place] 3/18 >> 1. Postoperative changes from recent left frontotemporal craniotomy for tumor resection of left lower dural-based tumor. Decreasing postoperative pneumocephalus with similar blood products at the resection site. 2.  Slightly improved left cerebral vasogenic edema with similar 3 mm of left-to-right shift. 3. No other new acute intracranial abnormality.  CULTURES: 3/12 MRSA PCR >> neg  ANTIBIOTICS: Cefazolin 3/15 x 1   SIGNIFICANT EVENTS: 3/11 Admit with progressive weakness, edema on CT head  3/15 Crani for resection of tumor, failed extubation  3/18 failed SBT  LINES/TUBES: ETT 3/15 >>  R aline 3/15 >> cortrak Foley  DISCUSSION: 67 y/o F admitted 3/11 with increasing weakness.  Known hx of Rosai Dorfman with previously stable CT head.  On admit found to have new edema with midline shift.  To OR 3/15 for left craniotomy for resection of tumor.  Mental status remains poor postoperatively precluding  extubation.  ASSESSMENT / PLAN:  PULMONARY A: Acute Respiratory Insufficiency  OSA - not on CPAP  4th Rib Fracture - noted on CXR  P:   Continue full MV support, PRVC 8 cc/kg Daily SBT- currently failing, poor mental status precludes extubation CXR in am Replace ETT to 21 at lip/resecure ETT Recheck ABG today to rule out metabolic abnormalities to contribute to encephalopathy  CARDIOVASCULAR A:  Hx PAF, HTN, HLD  P:  Tele monitoring SBP goal less than 170 per neurosurgery per Aline Metoprolol scheduled every 6 hours, with holding parameters for HR  Labetalol and hydralazine if needed for blood pressure control No anticoagulation  RENAL A:   Hypokalemia  Anion gap metabolic acidosis- resolved - net + 7.8 L  P:   0.45 NS at 50 ml/hr KCL 40 meq x 2 doses today Check Mg Follow BMP, Mg/ phos/ daily wt/ urine output Replace electrolyte as indicated   GASTROINTESTINAL A:   At Risk Malnutrition  P:   Continue Tube feeding Protonix for acid prophylaxis Bowel regimen  HEMATOLOGIC A:   Mild anemia P:  Trend CBC SCD only for VTE ppx  INFECTIOUS A:   Post-Operative Crani  - afebrile - PCT < 0.10 P:   Monitor surgical site Monitor clinically Trend WBC/ fever curve  ENDOCRINE A:   DM, continue hyperglycemia with TF and decadron P:    Increase SSI from sensitive to standard scale Continue Lantus 15 units HS, may need to increase to BID  NEUROLOGIC A:   Left Craniotomy s/p Tumor Removal  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  CCT: 45 mins  Kennieth Rad, AGACNP-BC [facility] Pgr: [phone] or if no answer [phone] 04/26/2017, 8:37 AM

## 2017-04-26 NOTE — Procedures (Signed)
ELECTROENCEPHALOGRAM REPORT  Date of Study: 04/26/2017  Patient's Name: Rachel [name] MRN: [id number] Date of Birth: February 01, 1951  Referring Provider: Dr. Marolyn Hammock  Clinical History: This is a 67 year old woman with progressive aphasia and right-sided weakness  Medications: Keppra  Technical Summary: A multichannel digital EEG recording measured by the international 10-20 system with electrodes applied with paste and impedances below 5000 ohms performed as portable with EKG monitoring in an awake and drowsy patient.  Hyperventilation and photic stimulation were not performed.  The digital EEG was referentially recorded, reformatted, and digitally filtered in a variety of bipolar and referential montages for optimal display.   Description: The patient is awake and drowsy during the recording.  During maximal wakefulness, there is a symmetric, medium voltage 6.6-.5 Hz posterior dominant rhythm that attenuates with eye opening. This is admixed with a small amount of diffuse 4-5 Hz theta and occasional diffuse 2-3 Hz delta slowing of the waking background.  During drowsiness, there is an increase in theta and delta slowing of the background. Deeper stages of sleep were not seen. Hyperventilation and photic stimulation were not performed.  There were no epileptiform discharges or electrographic seizures seen.    EKG lead showed sinus bradycardia at 54 bpm.  Impression: This awake and drowsy EEG is abnormal due to mild diffuse slowing of the waking background.  Clinical Correlation of the above findings indicates diffuse cerebral dysfunction that is non-specific in etiology and can be seen with hypoxic/ischemic injury, toxic/metabolic encephalopathies, neurodegenerative disorders, or medication effect.  The absence of epileptiform discharges does not rule out a clinical diagnosis of epilepsy.  Clinical correlation is advised.   Ellouise Newer, M.D.

## 2017-04-26 NOTE — Progress Notes (Signed)
Inpatient Diabetes Program Recommendations  AACE/ADA: New Consensus Statement on Inpatient Glycemic Control (2015)  Target Ranges:  Prepandial:   less than 140 mg/dL      Peak postprandial:   less than 180 mg/dL (1-2 hours)      Critically ill patients:  140 - 180 mg/dL   Results for Rachel [name], Rachel [name] (MRN [id number]) as of 04/26/2017 09:28  Ref. Range 04/25/2017 08:11 04/25/2017 12:09 04/25/2017 15:36 04/25/2017 19:52 04/25/2017 23:45 04/26/2017 03:46 04/26/2017 08:12  Glucose-Capillary Latest Ref Range: 65 - 99 mg/dL 237 (H)  Novolog 3 units 191 (H)  Novolog 2 units 188 (H)  Novolog 2 units 235 (H)  Novolog 3 units  Lantus 15 units 257 (H)  Novolog 5 units 211 (H)  Novolog 3 units 196 (H)  Novolog 2 units   Review of Glycemic Control  Diabetes history: DM2 Outpatient Diabetes medications: NPH 18 units BID Current orders for Inpatient glycemic control: Lantus 15 units QHS, Novolog 2-6 units Q4H; Decadron 6 mg Q8H  Inpatient Diabetes Program Recommendations: Correction (SSI): Noted change to ICU Gylcemic Control order set this morning.  Insulin - Meal Coverage: Please consider ordering Novolog 3 units Q4H for tube feeding coverage (in addition to Novolog correction).  Thanks, Barnie Alderman, RN, MSN, CDE Diabetes Coordinator Inpatient Diabetes Program [phone] (Team Pager from 8am to 5pm)

## 2017-04-27 ENCOUNTER — Inpatient Hospital Stay (HOSPITAL_COMMUNITY): Payer: BLUE CROSS/BLUE SHIELD

## 2017-04-27 ENCOUNTER — Encounter (HOSPITAL_COMMUNITY): Payer: Self-pay | Admitting: Neurosurgery

## 2017-04-27 LAB — GLUCOSE, CAPILLARY
GLUCOSE-CAPILLARY: 142 mg/dL — AB (ref 65–99)
GLUCOSE-CAPILLARY: 146 mg/dL — AB (ref 65–99)
GLUCOSE-CAPILLARY: 159 mg/dL — AB (ref 65–99)
GLUCOSE-CAPILLARY: 171 mg/dL — AB (ref 65–99)
GLUCOSE-CAPILLARY: 189 mg/dL — AB (ref 65–99)
GLUCOSE-CAPILLARY: 195 mg/dL — AB (ref 65–99)
Glucose-Capillary: 144 mg/dL — ABNORMAL HIGH (ref 65–99)
Glucose-Capillary: 145 mg/dL — ABNORMAL HIGH (ref 65–99)
Glucose-Capillary: 152 mg/dL — ABNORMAL HIGH (ref 65–99)
Glucose-Capillary: 173 mg/dL — ABNORMAL HIGH (ref 65–99)

## 2017-04-27 LAB — PROCALCITONIN: Procalcitonin: <0.1 ng/mL

## 2017-04-27 LAB — CBC
HEMATOCRIT: 29.6 % — AB (ref 36.0–46.0)
Hemoglobin: 10.1 g/dL — ABNORMAL LOW (ref 12.0–15.0)
MCH: 30.5 pg (ref 26.0–34.0)
MCHC: 34.1 g/dL (ref 30.0–36.0)
MCV: 89.4 fL (ref 78.0–100.0)
Platelets: 135 10*3/uL — ABNORMAL LOW (ref 150–400)
RBC: 3.31 MIL/uL — ABNORMAL LOW (ref 3.87–5.11)
RDW: 13 % (ref 11.5–15.5)
WBC: 16.6 10*3/uL — AB (ref 4.0–10.5)

## 2017-04-27 LAB — BASIC METABOLIC PANEL
Anion gap: 6 (ref 5–15)
BUN: 15 mg/dL (ref 6–20)
CHLORIDE: 107 mmol/L (ref 101–111)
CO2: 25 mmol/L (ref 22–32)
CREATININE: 0.35 mg/dL — AB (ref 0.44–1.00)
Calcium: 9.1 mg/dL (ref 8.9–10.3)
GFR calc Af Amer: >60 mL/min (ref >60)
GFR calc non Af Amer: >60 mL/min (ref >60)
Glucose, Bld: 170 mg/dL — ABNORMAL HIGH (ref 65–99)
Potassium: 4.2 mmol/L (ref 3.5–5.1)
Sodium: 138 mmol/L (ref 135–145)

## 2017-04-27 LAB — MAGNESIUM: Magnesium: 1.8 mg/dL (ref 1.7–2.4)

## 2017-04-27 LAB — TSH: TSH: 0.529 u[IU]/mL (ref 0.350–4.500)

## 2017-04-27 LAB — CALCIUM, IONIZED: CALCIUM, IONIZED, SERUM: 4.9 mg/dL (ref 4.5–5.6)

## 2017-04-27 MED ORDER — LORAZEPAM 2 MG/ML IJ SOLN
1.0000 mg | Freq: Once | INTRAMUSCULAR | Status: AC
  Administered 2017-04-27: 1 mg via INTRAVENOUS

## 2017-04-27 MED ORDER — LORAZEPAM 2 MG/ML IJ SOLN
INTRAMUSCULAR | Status: AC
  Administered 2017-04-27: 1 mg via INTRAVENOUS
  Filled 2017-04-27: qty 1

## 2017-04-27 MED ORDER — INSULIN ASPART 100 UNIT/ML ~~LOC~~ SOLN
2.0000 [IU] | SUBCUTANEOUS | Status: DC
  Administered 2017-04-27 – 2017-04-28 (×7): 2 [IU] via SUBCUTANEOUS

## 2017-04-27 MED ORDER — INSULIN ASPART 100 UNIT/ML ~~LOC~~ SOLN
3.0000 [IU] | SUBCUTANEOUS | Status: DC
  Administered 2017-04-27 (×2): 6 [IU] via SUBCUTANEOUS
  Administered 2017-04-27: 3 [IU] via SUBCUTANEOUS
  Administered 2017-04-27: 6 [IU] via SUBCUTANEOUS
  Administered 2017-04-27: 3 [IU] via SUBCUTANEOUS
  Administered 2017-04-28 (×2): 6 [IU] via SUBCUTANEOUS
  Administered 2017-04-28 – 2017-04-29 (×2): 3 [IU] via SUBCUTANEOUS
  Administered 2017-04-29: 9 [IU] via SUBCUTANEOUS
  Administered 2017-04-29 (×2): 6 [IU] via SUBCUTANEOUS
  Administered 2017-04-30 (×4): 3 [IU] via SUBCUTANEOUS
  Administered 2017-04-30: 6 [IU] via SUBCUTANEOUS
  Administered 2017-04-30 – 2017-05-01 (×2): 3 [IU] via SUBCUTANEOUS
  Administered 2017-05-01 (×2): 6 [IU] via SUBCUTANEOUS
  Administered 2017-05-01: 3 [IU] via SUBCUTANEOUS
  Administered 2017-05-01: 6 [IU] via SUBCUTANEOUS
  Administered 2017-05-02 (×2): 3 [IU] via SUBCUTANEOUS
  Administered 2017-05-03 (×2): 6 [IU] via SUBCUTANEOUS
  Administered 2017-05-03: 3 [IU] via SUBCUTANEOUS
  Administered 2017-05-03: 9 [IU] via SUBCUTANEOUS
  Administered 2017-05-04: 6 [IU] via SUBCUTANEOUS
  Administered 2017-05-04: 3 [IU] via SUBCUTANEOUS
  Administered 2017-05-04: 6 [IU] via SUBCUTANEOUS
  Administered 2017-05-05: 3 [IU] via SUBCUTANEOUS
  Administered 2017-05-05 (×2): 6 [IU] via SUBCUTANEOUS
  Administered 2017-05-06 (×2): 3 [IU] via SUBCUTANEOUS
  Administered 2017-05-07: 4 [IU] via SUBCUTANEOUS
  Administered 2017-05-07 – 2017-05-08 (×3): 3 [IU] via SUBCUTANEOUS

## 2017-04-27 MED ORDER — DEXTROSE 10 % IV SOLN: INTRAVENOUS | Status: DC | PRN

## 2017-04-27 MED ORDER — INSULIN DETEMIR 100 UNIT/ML ~~LOC~~ SOLN
8.0000 [IU] | Freq: Two times a day (BID) | SUBCUTANEOUS | Status: DC
  Administered 2017-04-27 – 2017-05-06 (×20): 8 [IU] via SUBCUTANEOUS
  Filled 2017-04-27 (×20): qty 0.08

## 2017-04-27 NOTE — Consult Note (Addendum)
NEURO HOSPITALIST CONSULT NOTE   Requestig physician: Dr. Pearline Cables   Reason for Consult: Not waking up postoperatively   History obtained from: Chart  HPI:                                                                                                                                          Rachel [name] is an 67 y.o. female with history of right eye blindness, diabetes, paroxysmal A. 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  As patient has a history of Rachel [name] disease.  This surgery was done on 04/23/2017.  Today's date is 3/19/ 2019--4 days postop.  Patient appears to be not waking up.  Patient did have an EEG that was done on 04/26/2017 which did not show any epileptiform activity but slowing.  CT of head was obtained yesterday without contrast.  The CT did show postoperative changes from recent left frontotemporal craniotomy for tumor.  It did show decreasing postoperative pneumocephalus.  Slightly improved left cerebral vasogenic edema with similar 3 mm left to right shift.  With no other acute intracranial abnormalities.  Patient is not received any significant sedating medications other than fentanyl this morning at 700 hours.  Patient is on Keppra at a dose of 1000 mg every 12.  Recent labs do show an elevated white blood cell count of 16.6, TSH within normal limits of 0.5-9, but glucoses have remained between 237 and upper 100s.  Neurology was asked to evaluate patient has per why she may not be waking up as expected.   On consultation she does open her eyes to voice, moves her legs spontaneously but minimally.  Past Medical History:  Diagnosis Date  . Blindness   . Diabetes mellitus type I ([place])   . GERD (gastroesophageal reflux disease)   . Hyperlipidemia   . Paroxysmal atrial fibrillation ([facility])   . Schizophrenia ([place])   . Sleep apnea   .  Subarachnoid hemorrhage ([place])   . Systemic hypertension   . Vertigo     Past Surgical History:  Procedure Laterality Date  . BRAIN TUMOR EXCISION    . NM MYOCAR PERF WALL MOTION  02/01/2007   no significant ischemia  . US ECHOCARDIOGRAPHY  12/20/2003   mild mitral annular ca+,mild MR,TR,PI,AOV mildly sclerotic    Family History  Problem Relation Age of Onset  . ADD / ADHD Neg Hx   . Alcohol abuse Neg Hx   . Drug abuse Neg Hx   . Anxiety disorder Neg Hx   . Bipolar disorder Neg Hx   . Dementia Neg Hx   . Depression Neg Hx   . OCD Neg Hx   . Paranoid behavior Neg Hx   . Schizophrenia  Neg Hx   . Seizures Neg Hx   . Sexual abuse Neg Hx   . Physical abuse Neg Hx         Social History:  reports that  has never smoked. she has never used smokeless tobacco. She reports that she does not drink alcohol or use drugs.  Allergies  Allergen Reactions  . Sulfa Antibiotics Anaphylaxis  . Ibuprofen Other (See Comments)    unknown  . Shellfish Allergy Swelling    Mouth    MEDICATIONS:                                                                                                                     Scheduled: . chlorhexidine gluconate (MEDLINE KIT)  15 mL Mouth Rinse BID  . dexamethasone  6 mg Intravenous Q8H  . insulin aspart  2 Units Subcutaneous Q4H  . insulin aspart  3-9 Units Subcutaneous Q4H  . insulin detemir  8 Units Subcutaneous Q12H  . mouth rinse  15 mL Mouth Rinse 10 times per day  . metoprolol tartrate  5 mg Intravenous Q6H  . multivitamin  15 mL Per Tube Daily  . pantoprazole sodium  40 mg Per Tube Daily  . PARoxetine  40 mg Per Tube QHS   Continuous: . sodium chloride 50 mL/hr at 04/27/17 0600  . dextrose    . feeding supplement (VITAL HIGH PROTEIN) 1,000 mL (04/27/17 0600)  . levETIRAcetam Stopped (04/27/17 1007)   SJG:GEZMOQHUTMLYY **OR** acetaminophen, dextrose, docusate, fentaNYL (SUBLIMAZE) injection, hydrALAZINE, labetalol, naLOXone (NARCAN)   injection, ondansetron **OR** ondansetron (ZOFRAN) IV, promethazine   ROS:                                                                                                                                       History obtained from unobtainable from patient due to mental status    Blood pressure 122/69, pulse (!) 53, temperature 98.3 F (36.8 C), temperature source Axillary, resp. rate 16, height 5' 6"  (1.676 m), weight 54.6 kg (120 lb 5.9 oz), SpO2 100 %.   General Examination:  Physical Exam  HEENT-  Normocephalic, left parietal incision with staples intact no oozing or drainage.  Normal external eye and conjunctiva.   Cardiovascular- S1-S2 audible, pulses palpable throughout   Lungs-no rhonchi or wheezing noted, no excessive working breathing.  Saturations within normal limits Abdomen- All 4 quadrants palpated and nontender Extremities- Warm, dry and intact Musculoskeletal-no joint tenderness, deformity or swelling Skin-warm and dry, no hyperpigmentation, vitiligo, or suspicious lesions  Neurological Examination Mental Status: Patient will open her eyes at times to voice.  She does not follow commands.  She does blink to threat.  At one time I did notice that she moved her left leg however I believe this was a triple reflex. Cranial Nerves: II: Blinks to threat III,IV, VI: Roving eyes not crossing midline to look left, at rest disconjugate, corneals/pupils/doll's intact.  Pupils are 2 mm and reactive  V,VII: Face asymmetric with mild RFD, facial light touch sensation normal bilaterally VIII: Opens eyes to voice  Motor: Patient is not moving her upper extremities spontaneously.  Her left arm is flaccid, right arm is flaccid but with movement shows paratonia.  Her right leg is flaccid and left leg is moving volitionally  Sensory: No significant response to pain other than the triple  reflex on the left leg Deep Tendon Reflexes: Deep tendon reflexes were not able to be appreciated on the right leg or left leg and Achilles and knee jerk with bilateral upper extremities 2+ Plantars: Right: Up going  left: Up going   Lab Results: Basic Metabolic Panel: Recent Labs  Lab 04/23/17 0429 04/23/17 0858 04/23/17 1952 04/24/17 0454 04/25/17 0501 04/26/17 0437 04/26/17 0911 04/27/17 0347  NA 143 141  --  141 138 136  --  138  K 3.1* 2.4*  --  3.2* 3.4* 3.2*  --  4.2  CL 109  --   --  110 110 107  --  107  CO2 28  --   --  21* 23 24  --  25  GLUCOSE 93  --   --  106* 245* 243*  --  170*  BUN 12  --   --  13 16 18   --  15  CREATININE 0.60  --   --  0.53 0.47 0.49  --  0.35*  CALCIUM 10.0  --   --  9.2 8.8* 8.7*  --  9.1  MG 2.0  --   --  2.0 1.9  --  1.8 1.8  PHOS  --   --  2.0* 1.5* 1.2*  --   --   --     CBC: Recent Labs  Lab 04/22/17 0340 04/23/17 0429 04/23/17 0858 04/24/17 0454 04/25/17 1555 04/26/17 0437 04/27/17 0347  WBC 11.7* 9.0  --  18.1* 16.0* 13.4* 16.6*  NEUTROABS 9.5* 7.2  --   --  13.5* 11.2*  --   HGB 14.4 14.0 10.9* 13.5 11.4* 10.5* 10.1*  HCT 42.9 41.7 32.0* 39.4 33.2* 30.7* 29.6*  MCV 90.5 89.7  --  88.5 89.2 88.7 89.4  PLT 158 145*  --  132* 91* 100* 135*    Cardiac Enzymes: No results for input(s): CKTOTAL, CKMB, CKMBINDEX, TROPONINI in the last 168 hours.  Lipid Panel: No results for input(s): CHOL, TRIG, HDL, CHOLHDL, VLDL, LDLCALC in the last 168 hours.  Imaging: Ct Head Wo Contrast  Result Date: 04/26/2017 CLINICAL DATA:  Follow-up examination status post brain tumor excision. EXAM: CT HEAD WITHOUT CONTRAST TECHNIQUE: Contiguous axial images were obtained from  the base of the skull through the vertex without intravenous contrast. COMPARISON:  Prior CT from 04/24/2017. FINDINGS: Brain: Postoperative changes from recent left frontotemporal craniotomy for resection of left lower convexity dural-based mass. Persistent small volume  postoperative pneumocephalus, improved relative to previous exam. Mixed intra-axial and extra-axial hemorrhage at the resection cavity is little interval changed. Persistent vasogenic edema within the left cerebral hemisphere appears slightly improved. Mild mass effect on the left lateral ventricle. 4 mm of left-to-right shift not significantly changed. No new hydrocephalus or ventricular trapping. Basilar cisterns remain patent. Otherwise stable appearance of the brain. No new intracranial hemorrhage. No acute large vessel territory infarct. Stable atrophy with chronic small vessel ischemic disease. Sequelae of prior suboccipital craniectomy. Vascular: No hyperdense vessel. Skull: Postoperative changes from previous left frontotemporal craniotomy. Skin staples remain in place. Sinuses/Orbits: Globes and orbital soft tissues within normal limits. Paranasal sinuses remain largely clear. Small left mastoid effusion. Other: None. IMPRESSION: 1. Postoperative changes from recent left frontotemporal craniotomy for tumor resection of left lower dural-based tumor. Decreasing postoperative pneumocephalus with similar blood products at the resection site. 2. Slightly improved left cerebral vasogenic edema with similar 3 mm of left-to-right shift. 3. No other new acute intracranial abnormality. Electronically Signed   By: Jeannine Boga M.D.   On: 04/26/2017 02:29   Dg Chest Port 1 View  Result Date: 04/27/2017 CLINICAL DATA:  Acute encephalopathy, cerebral edema, intracranial neoplasm, paroxysmal atrial fibrillation EXAM: PORTABLE CHEST 1 VIEW COMPARISON:  Portable chest x-ray of April 24, 2017 FINDINGS: The lungs are well-expanded. There are slightly increased lung markings in the left infrahilar region. There is no pleural effusion. The heart and pulmonary vascularity are normal. There is calcification in the wall of the aortic arch. The feeding tube tip projects below the inferior margin of the image. The  endotracheal tube tip projects 4.2 cm above the carina. IMPRESSION: Probable developing subsegmental atelectasis in the left lower lobe. Otherwise stable appearing chest since the earlier study with exception of interval placement of a feeding tube whose radiodense tip projects below the inferior margin of the image. Thoracic aortic atherosclerosis. Electronically Signed   By: David  Martinique M.D.   On: 04/27/2017 07:36   Assessment and plan per attending neurologist  Etta Quill PA-C [facility] [phone]  04/27/2017, 1:33 PM  Attending Neurohospitalist Addendum Patient seen and examined with APP/Resident. Agree with the history and physical as documented above. I have reviewed the images.  CT scan shows postoperative changes from the recent left frontotemporal craniotomy for tumor resection of the left lower dural based tumor with decreasing postop pneumocephalus and similar blood products of the resection site along with slightly improved cerebral vasogenic edema.  There is 3 mm left to right shift Patient was much more awake for me, with mildly disconjugate gaze, equal pupils, roving eye movements from looking to the right tell midline but not crossing the midline to look left.  She did blink to threat bilaterally.  Mild facial asymmetry.  Did not spontaneously move upper extremities during this encounter but to noxious stimulus localized with both upper extremities.  Moving left lower extremity spontaneously but right lower extremity only to noxious stimulus.   Assessment/Plan: This is a 67 year old female with past medical history as above with recent tumor resection on the left parietal dura.  Postoperatively patient has significant amount of edema which has remained stable at with a 3 mm shift from left to right-involving the left parietal and temporal lobe.  Patient has not  had any clinical seizures however there is question if she is currently having subclinical seizures.   Currently she is not waking up as expected which is the reason for the neurology consult. Given the extensive involvement of her left parietal and temporal lobe showing cerebral edema, it is possible that she might have underlying seizures versus the swelling causing decreased level of consciousness and altered mental status. A bedside trial of Ativan 66m IV x1 did not yield significant exam change.  Recommend -Continue Keppra 1g BID for now. - MRI with and without contrast if staples are MRI compatible--If unable repeat CT with and without contrast.  - Long-term EEG-this should not be delayed if MRI is not happening anytime soon. -Repeat CT head if MRI does not happen within the next 6-12 hours. -Neurology will continue to follow with you.  -- AAmie Portland MD [facility] Pager: [phone] 7pm to 7am, please call on call as listed on AMION.   CRITICAL CARE ATTESTATION This patient is critically ill and at significant risk of neurological worsening, death and care requires constant monitoring of vital signs, hemodynamics,respiratory and cardiac monitoring. I spent 50  minutes of neurocritical care time performing neurological assessment, discussion with family, other specialists and medical decision making of high complexityin the care of  this patient.

## 2017-04-27 NOTE — Progress Notes (Signed)
eLink Physician-Brief Progress Note Patient Name: Rachel [name] DOB: [birth year] MRN: [id number]   Date of Service  04/27/2017  HPI/Events of Note  Blood glucose = 141 --> 142 --> 152 --> 142 --> 146. Currently on a Novolog IV infusion at 1.6 units/hour. Aniog gap = 5 and CO2 = 24.   eICU Interventions  Will order: 1. Transition to Phase III - Levemir and Q 4 hour Novolog SSI + tube feed coverage.  2. D/C Insulin IV infusion - 2 hours post Levemir dose.      Intervention Category Major Interventions: Hyperglycemia - active titration of insulin therapy  Jahmad Petrich Eugene 04/27/2017, 2:35 AM

## 2017-04-27 NOTE — Progress Notes (Signed)
LTM EEG started 

## 2017-04-27 NOTE — Progress Notes (Signed)
PULMONARY / CRITICAL CARE MEDICINE   Name: Rachel [name] MRN: [id number] DOB: Mar 28, 1950    ADMISSION DATE:  04/19/2017 CONSULTATION DATE:  04/23/17  REFERRING MD:  Dr. Trenton Gammon   CHIEF COMPLAINT:  Respiratory Insufficiency   HISTORY OF PRESENT ILLNESS:  67 y/o F who presented 3/11 with a 2 day history of worsening right sided weakness.    At baseline, she is dependent for all ADL's. She required assistance to walk, has to be fed.  She has a history of Rosai Dorfman and had previously undergone an occipital craniotomy for removal of lesions in the past.  She recently had a stable appearance of meningeal lesions in the left middle cranial fossa.  She has a hx of schizophrenia and OSA but not on CPAP after at trial with no improvement.    Initial ER evaluation included a CT of the head which showed extensive left sided vasogenic edema involving the external capsule extending to the temporal lobe, parietal lobe with associated mass-effect with compression of the left parietal ventricle, 48mm left to right midline shift. She was treated with IV decadron and evaluated by NSGY. 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 Post procedure, she failed extubation and required re-intubation.  She was returned to ICU on mechanical ventilation.     PCCM consulted for post-operative ICU care.   SUBJECTIVE:   This is postoperative day 3, she is not on any sedation currently, and still is not interactive. She remains intubated and mechanically ventilated.  VITAL SIGNS: BP 140/74   Pulse (!) 51   Temp 97.9 F (36.6 C) (Axillary)   Resp 14   Ht 5\' 6"  (1.676 m)   Wt 120 lb 5.9 oz (54.6 kg)   SpO2 100%   BMI 19.43 kg/m   HEMODYNAMICS:    VENTILATOR SETTINGS: Vent Mode: PRVC FiO2 (%):  [30 %-40 %] 30 % Set Rate:  [14 bmp-16 bmp] 14 bmp Vt Set:  [480 mL] 480 mL PEEP:  [5 cmH20] 5 cmH20 Plateau Pressure:  [12 cmH20-15 cmH20] 12  cmH20  INTAKE / OUTPUT: I/O last 3 completed shifts: In: 4089.9 [I.V.:1764.9; NG/GT:1925; IV CHYIFOYDX:412] Out: 8786 [Urine:1435]  PHYSICAL EXAMINATION: General: Ill-appearing woman, in no distress, orally intubated and mechanically ventilated  Neuro: Spontaneous eye opening, but no tracking.  Occasional blinking.  Pupils are equal and gaze is disconjugate.  Not moving extremities to sternal rub.   HEENT: Endotracheal tube in place Cardiovascular: The PMI is not remarkable today.   S1 and S2 are regular without murmur rub or gallop Lungs: Respirations are unlabored, and she is breathing above the set ventilator rate.  There is symmetric air movement, some scattered rhonchi, no wheezes. Abdomen: Soft, nontender, positive bowel sounds Musculoskeletal: No deformity Skin: No rash  LABS:  BMET Recent Labs  Lab 04/25/17 0501 04/26/17 0437 04/27/17 0347  NA 138 136 138  K 3.4* 3.2* 4.2  CL 110 107 107  CO2 23 24 25   BUN 16 18 15   CREATININE 0.47 0.49 0.35*  GLUCOSE 245* 243* 170*    Electrolytes Recent Labs  Lab 04/23/17 1952 04/24/17 0454 04/25/17 0501 04/26/17 0437 04/26/17 0911 04/27/17 0347  CALCIUM  --  9.2 8.8* 8.7*  --  9.1  MG  --  2.0 1.9  --  1.8 1.8  PHOS 2.0* 1.5* 1.2*  --   --   --     CBC Recent Labs  Lab 04/25/17 1555 04/26/17 0437 04/27/17 0347  WBC 16.0* 13.4* 16.6*  HGB 11.4* 10.5* 10.1*  HCT 33.2* 30.7* 29.6*  PLT 91* 100* 135*    Coag's No results for input(s): APTT, INR in the last 168 hours.  Sepsis Markers Recent Labs  Lab 04/24/17 1041 04/25/17 0501 04/25/17 1555 04/26/17 0437 04/27/17 0347  LATICACIDVEN 1.0 1.2  --   --   --   PROCALCITON  --   --  <0.10 <0.10 <0.10    ABG Recent Labs  Lab 04/23/17 1500 04/25/17 1500 04/26/17 0940  PHART 7.468* 7.505* 7.516*  PCO2ART 35.0 31.7* 32.8  PO2ART 479.0* 211.0* 211.0*    Liver Enzymes Recent Labs  Lab 04/21/17 0359 04/22/17 0340 04/26/17 0437  AST 18 54* 26  ALT  15 47 44  ALKPHOS 54 49 49  BILITOT 0.5 0.7 0.7  ALBUMIN 3.2* 2.9* 2.0*    Cardiac Enzymes No results for input(s): TROPONINI, PROBNP in the last 168 hours.  Glucose Recent Labs  Lab 04/26/17 2258 04/27/17 0002 04/27/17 0057 04/27/17 0159 04/27/17 0258 04/27/17 0344  GLUCAP 142* 152* 142* 146* 173* 144*    Imaging Dg Chest Port 1 View  Result Date: 04/27/2017 CLINICAL DATA:  Acute encephalopathy, cerebral edema, intracranial neoplasm, paroxysmal atrial fibrillation EXAM: PORTABLE CHEST 1 VIEW COMPARISON:  Portable chest x-ray of April 24, 2017 FINDINGS: The lungs are well-expanded. There are slightly increased lung markings in the left infrahilar region. There is no pleural effusion. The heart and pulmonary vascularity are normal. There is calcification in the wall of the aortic arch. The feeding tube tip projects below the inferior margin of the image. The endotracheal tube tip projects 4.2 cm above the carina. IMPRESSION: Probable developing subsegmental atelectasis in the left lower lobe. Otherwise stable appearing chest since the earlier study with exception of interval placement of a feeding tube whose radiodense tip projects below the inferior margin of the image. Thoracic aortic atherosclerosis. Electronically Signed   By: David  Martinique M.D.   On: 04/27/2017 07:36     STUDIES:  CT Head 3/11 >> Extensive left-sided vasogenic edema involving external capsule, left-sided centrum semi ovale, extending into the temporal lobe and parietal lobe. There is associated mass effect with compression of the left lateral ventricle and 3 mm of left-to-right midline shift at the foramen of Monro. Indistinctness of the left-sided sulci may be secondary to mass effect, however, new metastatic disease or manifestation of the patient's known histiocytosis may be the underlying etiology  CULTURES:   ANTIBIOTICS: Cefazolin 3/15 >>   SIGNIFICANT EVENTS: 3/11 Admit with progressive weakness,  edema on CT head  3/15 Crani for resection of tumor, failed extubation   LINES/TUBES: ETT 3/15 >>   DISCUSSION: 67 y/o F admitted 3/11 with increasing weakness.  Known hx of Rosai Dorfman with previously stable CT head.  On admit found to have new edema with midline shift.  To OR 3/15 for left craniotomy for resection of tumor.  Mental status remains poor postoperatively precluding extubation.  ASSESSMENT / PLAN:  PULMONARY A: Acute Respiratory Insufficiency  OSA - not on CPAP  4th Rib Fracture - noted on CXR  P:   Continue current ventilator support, as I try to sort out why her mental status is not significantly improved. Wean PEEP / FiO2 for sats > 90% Follow intermittent chest x-ray  CARDIOVASCULAR A:  Hx PAF, HTN, HLD  P:  SBP goal less than 170 per neurosurgery Metoprolol scheduled every 6 hours Labetalol  and hydralazine if needed for blood pressure control No anticoagulation  RENAL A:   Hypokalemia  Anion gap metabolic acidosis P:   Follow BMP, urine output Check lactate now Replace electrolyte as indicated (potassium now)  GASTROINTESTINAL A:   At Risk Malnutrition  P:   Tube feeding Protonix for acid prophylaxis  HEMATOLOGIC A:   P:  Follow CBC SCD in place  INFECTIOUS A:   Post-Operative Crani  P:   Monitor surgical site Cefazolin per neurosurgery  ENDOCRINE A:   DM  P:    Sliding scale insulin per protocol Continue Lantus as ordered.  We may need to intensify therapy with a combination of tube feeding and Decadron.  NEUROLOGIC A:   Left Craniotomy s/p Tumor Removal  Cerebral Edema with Midline Shift  Rosai Dorfman Syndrome  P:   Mental status remains very poor and this is not accounted for by the follow-up CT scan.  Unfortunately we are not finding metabolic contributors to her ongoing poor mental status.  She had a spot EEG that was negative, I am contemplating 24-hour EEG monitoring to rule out subclinical events.   Lars Masson,  MD 04/27/2017, 7:45 AM [facility] [phone] or if no answer [phone]

## 2017-04-27 NOTE — Progress Notes (Signed)
Patient ID: Rachel [name], female   DOB: 12-Feb-1950, 67 y.o.   MRN: [id number] Doing better intermittently following commands

## 2017-04-27 NOTE — Plan of Care (Signed)
No S/S of infection, no inflammation at incision site. Tube feedings at goal.

## 2017-04-27 NOTE — Progress Notes (Signed)
SLP Cancellation Note  Patient Details Name: JAMYA STARRY MRN: [id number] DOB: 02/16/1950   Cancelled treatment:       Reason Eval/Treat Not Completed: Patient not medically ready. Will sign off at this time, please reorder when appropriate.    Takari Lundahl, Katherene Ponto 04/27/2017, 7:46 AM

## 2017-04-28 ENCOUNTER — Inpatient Hospital Stay (HOSPITAL_COMMUNITY): Payer: BLUE CROSS/BLUE SHIELD

## 2017-04-28 LAB — GLUCOSE, CAPILLARY
GLUCOSE-CAPILLARY: 101 mg/dL — AB (ref 65–99)
GLUCOSE-CAPILLARY: 123 mg/dL — AB (ref 65–99)
GLUCOSE-CAPILLARY: 175 mg/dL — AB (ref 65–99)
Glucose-Capillary: 129 mg/dL — ABNORMAL HIGH (ref 65–99)
Glucose-Capillary: 157 mg/dL — ABNORMAL HIGH (ref 65–99)
Glucose-Capillary: 103 mg/dL — ABNORMAL HIGH (ref 65–99)

## 2017-04-28 LAB — BASIC METABOLIC PANEL
Anion gap: 8 (ref 5–15)
BUN: 16 mg/dL (ref 6–20)
CHLORIDE: 103 mmol/L (ref 101–111)
CO2: 25 mmol/L (ref 22–32)
Calcium: 9.6 mg/dL (ref 8.9–10.3)
Creatinine, Ser: <0.3 mg/dL — ABNORMAL LOW (ref 0.44–1.00)
Glucose, Bld: 121 mg/dL — ABNORMAL HIGH (ref 65–99)
POTASSIUM: 3.7 mmol/L (ref 3.5–5.1)
Sodium: 136 mmol/L (ref 135–145)

## 2017-04-28 LAB — BLOOD GAS, ARTERIAL
ACID-BASE EXCESS: 6.2 mmol/L — AB (ref 0.0–2.0)
Bicarbonate: 29.9 mmol/L — ABNORMAL HIGH (ref 20.0–28.0)
Drawn by: 44135
FIO2: 30
LHR: 14 {breaths}/min
O2 SAT: 99.3 %
PATIENT TEMPERATURE: 97.9
PEEP/CPAP: 5 cmH2O
PH ART: 7.48 — AB (ref 7.350–7.450)
VT: 480 mL
pCO2 arterial: 40.4 mmHg (ref 32.0–48.0)
pO2, Arterial: 139 mmHg — ABNORMAL HIGH (ref 83.0–108.0)

## 2017-04-28 LAB — CBC WITH DIFFERENTIAL/PLATELET
Basophils Absolute: 0 10*3/uL (ref 0.0–0.1)
Basophils Relative: 0 %
EOS ABS: 0 10*3/uL (ref 0.0–0.7)
Eosinophils Relative: 0 %
HCT: 33.3 % — ABNORMAL LOW (ref 36.0–46.0)
HEMOGLOBIN: 11 g/dL — AB (ref 12.0–15.0)
LYMPHS ABS: 2.7 10*3/uL (ref 0.7–4.0)
LYMPHS PCT: 13 %
MCH: 29.6 pg (ref 26.0–34.0)
MCHC: 33 g/dL (ref 30.0–36.0)
MCV: 89.5 fL (ref 78.0–100.0)
Monocytes Absolute: 2.9 10*3/uL — ABNORMAL HIGH (ref 0.1–1.0)
Monocytes Relative: 14 %
NEUTROS PCT: 73 %
Neutro Abs: 15.4 10*3/uL — ABNORMAL HIGH (ref 1.7–7.7)
Platelets: 173 10*3/uL (ref 150–400)
RBC: 3.72 MIL/uL — AB (ref 3.87–5.11)
RDW: 12.5 % (ref 11.5–15.5)
WBC: 21 10*3/uL — ABNORMAL HIGH (ref 4.0–10.5)

## 2017-04-28 MED ORDER — POTASSIUM CHLORIDE 20 MEQ/15ML (10%) PO SOLN
40.0000 meq | Freq: Once | ORAL | Status: AC
  Administered 2017-04-28: 40 meq
  Filled 2017-04-28: qty 30

## 2017-04-28 MED ORDER — DEXAMETHASONE SODIUM PHOSPHATE 10 MG/ML IJ SOLN
4.0000 mg | Freq: Three times a day (TID) | INTRAMUSCULAR | Status: DC
  Administered 2017-04-28 – 2017-05-05 (×21): 4 mg via INTRAVENOUS
  Filled 2017-04-28 (×21): qty 1

## 2017-04-28 MED ORDER — GADOBENATE DIMEGLUMINE 529 MG/ML IV SOLN
12.0000 mL | Freq: Once | INTRAVENOUS | Status: AC | PRN
  Administered 2017-04-28: 12 mL via INTRAVENOUS

## 2017-04-28 MED ORDER — FUROSEMIDE 10 MG/ML IJ SOLN
40.0000 mg | Freq: Once | INTRAMUSCULAR | Status: AC
  Administered 2017-04-28: 40 mg via INTRAVENOUS
  Filled 2017-04-28: qty 4

## 2017-04-28 MED ORDER — INSULIN ASPART 100 UNIT/ML ~~LOC~~ SOLN
2.0000 [IU] | SUBCUTANEOUS | Status: DC
  Administered 2017-04-28 – 2017-05-08 (×58): 2 [IU] via SUBCUTANEOUS

## 2017-04-28 NOTE — Progress Notes (Signed)
Patient was transported to MRI without any complications.

## 2017-04-28 NOTE — Progress Notes (Signed)
D/C LTM per Dr Rory Percy

## 2017-04-28 NOTE — Progress Notes (Signed)
Patient ID: Rachel [name], female   DOB: October 12, 1950, 67 y.o.   MRN: [id number] No significant change in neuro exam gaze preference still present intermittently will follow commands follow-up imaging shows normal postoperative findings.

## 2017-04-28 NOTE — Progress Notes (Signed)
PULMONARY / CRITICAL CARE MEDICINE   Name: Rachel [name] MRN: [id number] DOB: [birth year]    ADMISSION DATE:  04/19/2017 CONSULTATION DATE:  04/23/17  REFERRING MD:  Dr. Trenton [name]   CHIEF COMPLAINT:  Respiratory Insufficiency   HISTORY OF PRESENT ILLNESS:  67 y/o F who presented 3/11 with a 2 day history of worsening right sided weakness.    At baseline, she is dependent for all ADL's. She required assistance to walk, has to be fed.  She has a history of Rachel [name] and had previously undergone an occipital craniotomy for removal of lesions in the past.  She recently had a stable appearance of meningeal lesions in the left middle cranial fossa.  She has a hx of schizophrenia and OSA but not on CPAP after at trial with no improvement.    Initial ER evaluation included a CT of the head which showed extensive left sided vasogenic edema involving the external capsule extending to the temporal lobe, parietal lobe with associated mass-effect with compression of the left parietal ventricle, 47mm left to right midline shift. She was treated with IV decadron and evaluated by NSGY. 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 Post procedure, she failed extubation and required re-intubation.  She was returned to ICU on mechanical ventilation.     PCCM consulted for post-operative ICU care.   SUBJECTIVE:   No acute events.  EEG with focal slowing over left hemisphere and mild generalized background slowing, c/w focal cerebral disturbance on left with superimposed encephalopathy pattern.  Neuro feels she will have a prolonged course before improvement.  VITAL SIGNS: BP (!) 158/81   Pulse (!) 58   Temp 97.6 F (36.4 C) (Axillary)   Resp 12   Ht 5\' 6"  (1.676 m)   Wt 58.4 kg (128 lb 12 oz)   SpO2 100%   BMI 20.78 kg/m   HEMODYNAMICS:    VENTILATOR SETTINGS: Vent Mode: PSV;CPAP FiO2 (%):  [30 %] 30 % Set Rate:  [14 bmp] 14  bmp Vt Set:  [480 mL] 480 mL PEEP:  [5 cmH20] 5 cmH20 Pressure Support:  [10 cmH20] 10 cmH20 Plateau Pressure:  [11 cmH20-14 cmH20] 14 cmH20  INTAKE / OUTPUT: I/O last 3 completed shifts: In: 4091.6 [I.V.:1811.6; NG/GT:1980; IV Piggyback:300] Out: 0623 [Urine:1825]  PHYSICAL EXAMINATION:  General:  Chronically ill appearing female, no distress Neuro: No response to verbal or noxious stimuli, does not follow any commands HEENT: Endotracheal tube in place.  Left scalp incision with staples C/D/I Cardiovascular: RRR, no M/R/G Lungs: Respirations are unlabored, CTAB Abdomen: Soft, nontender, positive bowel sounds Musculoskeletal: No deformity Skin: No rash  LABS:  BMET Recent Labs  Lab 04/26/17 0437 04/27/17 0347 04/28/17 0404  NA 136 138 136  K 3.2* 4.2 3.7  CL 107 107 103  CO2 24 25 25   BUN 18 15 16   CREATININE 0.49 0.35* <0.30*  GLUCOSE 243* 170* 121*    Electrolytes Recent Labs  Lab 04/23/17 1952 04/24/17 0454 04/25/17 0501 04/26/17 0437 04/26/17 0911 04/27/17 0347 04/28/17 0404  CALCIUM  --  9.2 8.8* 8.7*  --  9.1 9.6  MG  --  2.0 1.9  --  1.8 1.8  --   PHOS 2.0* 1.5* 1.2*  --   --   --   --     CBC Recent Labs  Lab 04/26/17 0437 04/27/17 0347 04/28/17 0404  WBC 13.4* 16.6* 21.0*  HGB 10.5*  10.1* 11.0*  HCT 30.7* 29.6* 33.3*  PLT 100* 135* 173    Coag's No results for input(s): APTT, INR in the last 168 hours.  Sepsis Markers Recent Labs  Lab 04/24/17 1041 04/25/17 0501 04/25/17 1555 04/26/17 0437 04/27/17 0347  LATICACIDVEN 1.0 1.2  --   --   --   PROCALCITON  --   --  <0.10 <0.10 <0.10    ABG Recent Labs  Lab 04/25/17 1500 04/26/17 0940 04/28/17 0330  PHART 7.505* 7.516* 7.480*  PCO2ART 31.7* 32.8 40.4  PO2ART 211.0* 211.0* 139*    Liver Enzymes Recent Labs  Lab 04/22/17 0340 04/26/17 0437  AST 54* 26  ALT 47 44  ALKPHOS 49 49  BILITOT 0.7 0.7  ALBUMIN 2.9* 2.0*    Cardiac Enzymes No results for input(s):  TROPONINI, PROBNP in the last 168 hours.  Glucose Recent Labs  Lab 04/27/17 1147 04/27/17 1526 04/27/17 2013 04/27/17 2351 04/28/17 0339 04/28/17 0811  GLUCAP 189* 195* 171* 159* 129* 101*    Imaging No results found.   STUDIES:  CT Head 3/11 >> Extensive left-sided vasogenic edema involving external capsule, left-sided centrum semi ovale, extending into the temporal lobe and parietal lobe. There is associated mass effect with compression of the left lateral ventricle and 3 mm of left-to-right midline shift at the foramen of Monro. Indistinctness of the left-sided sulci may be secondary to mass effect, however, new metastatic disease or manifestation of the patient's known histiocytosis may be the underlying etiology EEG 3/20 > focal slowing over left hemisphere and mild generalized background slowing, c/w focal cerebral disturbance on left with superimposed encephalopathy pattern.  CULTURES:   ANTIBIOTICS: Cefazolin 3/15 x 1  SIGNIFICANT EVENTS: 3/11 Admit with progressive weakness, edema on CT head  3/15 Crani for resection of tumor, failed extubation   LINES/TUBES: ETT 3/15 >>   DISCUSSION: 67 y/o F admitted 3/11 with increasing weakness.  Known hx of Rachel [name] with previously stable CT head.  On admit found to have new edema with midline shift.  To OR 3/15 for left craniotomy for resection of tumor.  Mental status remains poor postoperatively precluding extubation.  ASSESSMENT / PLAN:  PULMONARY A: Acute Respiratory Insufficiency  OSA - not on CPAP  4th Rib Fracture - noted on CXR  P:   Continue current ventilator support, will need time before can start weaning given cerebral edema Might need trach depending on timing of when she starts to show some improvement Wean PEEP / FiO2 for sats > 90% Follow intermittent chest x-ray  CARDIOVASCULAR A:  Hx PAF, HTN, HLD  P:  SBP goal less than 170 per neurosurgery Metoprolol scheduled every 6 hours Labetalol  and hydralazine if needed for blood pressure control No anticoagulation  RENAL A:   Hypokalemia - resolved Anion gap metabolic acidosis - resolved Volume overload - +11L net P:   Follow BMP, urine output Replace electrolyte as indicated KVO fluids 40 mg lasix now and assess response  GASTROINTESTINAL A:   At risk Malnutrition  SUP P:   Tube feeding Protonix for GI prophylaxis  HEMATOLOGIC A:   VTE prophylaxis P:  Follow CBC SCD in place  INFECTIOUS A:   No indication of infection P:   Monitor surgical site  ENDOCRINE A:   DM  P:    Sliding scale insulin per protocol Continue Lantus as ordered.  We may need to intensify therapy with a combination of tube feeding and Decadron  NEUROLOGIC A:   Left  Craniotomy s/p Tumor Removal  Cerebral Edema with Midline Shift  Rachel [name] Syndrome  P:   Continue supportive care, per neuro they suspect that she will take some time to improve given degree of cerebral edema Neuro and neurosurgery following  CC time: 30 min.   Montey Hora, [facility] Pager: [phone]  or [phone] 04/28/2017, 9:12 AM

## 2017-04-28 NOTE — Progress Notes (Signed)
Pt transported back from MRI w/ no apparent complications.

## 2017-04-28 NOTE — Progress Notes (Addendum)
Subjective: Remains ventilated but not following commands. Opens eyes to noxious stimuli and moves left leg volitionally.  Initial gaze to left but then becomes normal. Remains on LTM--NO epileptiform activity  For some reason steroids were stopped but now back reordered.   Exam: Vitals:   04/28/17 0736 04/28/17 0800  BP: 135/70 (!) 158/81  Pulse: (!) 51 (!) 58  Resp: 12 12  Temp:    SpO2: 100% 100%    Physical Exam   HEENT-  Normocephalic, incision with staples on the left, .  Normal external eye and conjunctiva.   Cardiovascular- S1-S2 audible, pulses palpable throughout   Lungs-no rhonchi or wheezing noted, no excessive working breathing.  Saturations within normal limits Neuro:  Mental Status: Remains ventilated but not following commands. Opens eyes to noxious stimuli and moves left leg volitionally. Cranial Nerves: II: blinks to threat bilaterally,  III,IV, VI: ptosis not present,  Initial gaze to left but then becomes normal. bilaterally pupils equal, round, reactive to light and accommodation V,VII: face symmetric, VIII: hearing normal bilaterally  Motor: Moves bilateral arms 2/5 and left leg 3/5 volitionally to noxious stimuli Sensory: Pinprick and light touch intact throughout, bilaterally Deep Tendon Reflexes: 2+ and symmetric throughout Plantars: Right: downgoing   Left: downgoing   Medications:  Scheduled: . chlorhexidine gluconate (MEDLINE KIT)  15 mL Mouth Rinse BID  . dexamethasone  4 mg Intravenous Q8H  . insulin aspart  2 Units Subcutaneous Q4H  . insulin aspart  3-9 Units Subcutaneous Q4H  . insulin detemir  8 Units Subcutaneous Q12H  . mouth rinse  15 mL Mouth Rinse 10 times per day  . metoprolol tartrate  5 mg Intravenous Q6H  . multivitamin  15 mL Per Tube Daily  . pantoprazole sodium  40 mg Per Tube Daily  . PARoxetine  40 mg Per Tube QHS   Continuous: . sodium chloride 50 mL/hr at 04/28/17 0800  . feeding supplement (VITAL HIGH PROTEIN)  1,000 mL (04/28/17 0800)  . levETIRAcetam Stopped (04/27/17 2315)   TOI:[id number] **OR** acetaminophen, docusate, fentaNYL (SUBLIMAZE) injection, hydrALAZINE, labetalol, naLOXone (NARCAN)  injection, ondansetron **OR** ondansetron (ZOFRAN) IV, promethazine  Pertinent Labs/Diagnostics: EEG SUMMARY: This was an abnormal continuous video EEG due to focal slowing/asymmetric background over the left hemisphere as well as some mild generalized background slowing. This was indicative of a focal cerebral disturbance on the left with a mild superimposed encephalopathy pattern. There were no epileptiform discharges    Dg Chest Port 1 View  Result Date: 04/27/2017 CLINICAL DATA:  Acute encephalopathy, cerebral edema, intracranial neoplasm, paroxysmal atrial fibrillation EXAM: PORTABLE CHEST 1 VIEW COMPARISON:  Portable chest x-ray of April 24, 2017 FINDINGS: The lungs are well-expanded. There are slightly increased lung markings in the left infrahilar region. There is no pleural effusion. The heart and pulmonary vascularity are normal. There is calcification in the wall of the aortic arch. The feeding tube tip projects below the inferior margin of the image. The endotracheal tube tip projects 4.2 cm above the carina. IMPRESSION: Probable developing subsegmental atelectasis in the left lower lobe. Otherwise stable appearing chest since the earlier study with exception of interval placement of a feeding tube whose radiodense tip projects below the inferior margin of the image. Thoracic aortic atherosclerosis. Electronically Signed   By: David  Martinique M.D.   On: 04/27/2017 07:36     Etta Quill PA-C [facility] [phone]  Impression: AS NOTED PREVIOUSLY:This is a 67 year old female with past medical history as above with recent tumor resection  on the left parietal dura.  Postoperatively patient has significant amount of edema which has remained stable at with a 3 mm shift from left to  right-involving the left parietal and temporal lobe.  Patient has not had any clinical seizures however there is question if she is currently having subclinical seizures.  Currently she is not waking up as expected which is the reason for the neurology consult. Given the extensive involvement of her left parietal and temporal lobe showing cerebral edema,and no epileptiform activity on LTM her decreased LOC is likely due to cerebral edema. Steroids have reinstated and will see if her LOC improves.   Recommendations: 1) MRI with and without contrast if staples are MRI compatible--If unable repeat CT with and without contrast/Repeat CT head if MRI does not happen within the next 6-12 hours. 2) continue current AED and Steroids.   Would expect improvement to be slow, with resolving edema and resolution of post op blood etc.  04/28/2017, 8:57 AM  Attending Neurohospitalist Addendum Patient seen and examined with APP/Resident. Agree with the history and physical as documented above. Agree with the plan as documented, which I helped formulate. I have independently reviewed the chart, obtained history, review of systems and examined the patient.I have personally reviewed pertinent head/neck/spine imaging (CT/MRI). LTM EEG shows slowing with no seizures. Imaging reveals significant swelling in the left partieto-temporal region along with blood products to some degree, which might be causing her encephalopathy.  I would give her more time and the swelling to go down and blood to resolve before her mentation improves. I would continue AEDs and Decadron as you are currently pursuing. Please feel free to call with any questions. --- Amie Portland, MD [facility] Pager: [phone]  If 7pm to 7am, please call on call as listed on AMION.  CRITICAL CARE ATTESTATION This patient is critically ill and at significant risk of neurological worsening, death and care requires constant monitoring of  vital signs, hemodynamics,respiratory and cardiac monitoring. I spent 30  minutes of neurocritical care time performing neurological assessment, discussion with family, other specialists and medical decision making of high complexityin the care of  this patient.

## 2017-04-28 NOTE — Plan of Care (Signed)
Care plan and education updated. Lianne Bushy RN BSN.

## 2017-04-28 NOTE — Procedures (Signed)
LTM-EEG Report   HISTORY: Continuous video-EEG monitoring performed for 67 year old with L parietal craniotomy with tumor resection, now with persistent confusion postop.  ACQUISITION: International 10-20 system for electrode placement; 18 channels with additional eyes linked to ipsilateral ears and EKG. Additional T1-T2 electrodes were used. Continuous video recording obtained.    EEG NUMBER: MEDICATIONS:  Day 1: LEV    DAY #1: from 1531 04/27/17 to 0730 04/28/17  BACKGROUND: An overall medium voltage continuous recording with some spontaneous variability and reactivity. The record as asymmetric with left hemisphere activity generally slower and with less fast activity compared to the right. Activity over the left consisted of medium voltage, irregular delta-theta while activity over the right consisted of medium voltage, semi-rhythmic theta-delta with an intermittent 7-8 Hz posterior basic rhythm. Sleep was captured with normal stage II sleep architecture bilaterally.  EPILEPTIFORM/PERIODIC ACTIVITY: none  SEIZURES: none  EVENTS: none    EKG: no significant arrhythmia   SUMMARY: This was an abnormal continuous video EEG due to focal slowing/asymmetric background over the left hemisphere as well as some mild generalized background slowing. This was indicative of a focal cerebral disturbance on the left with a mild superimposed encephalopathy pattern. There were no epileptiform discharges.

## 2017-04-29 ENCOUNTER — Inpatient Hospital Stay (HOSPITAL_COMMUNITY): Payer: BLUE CROSS/BLUE SHIELD

## 2017-04-29 DIAGNOSIS — J9601 Acute respiratory failure with hypoxia: Secondary | ICD-10-CM

## 2017-04-29 LAB — GLUCOSE, CAPILLARY
GLUCOSE-CAPILLARY: 188 mg/dL — AB (ref 65–99)
Glucose-Capillary: 127 mg/dL — ABNORMAL HIGH (ref 65–99)
Glucose-Capillary: 142 mg/dL — ABNORMAL HIGH (ref 65–99)
Glucose-Capillary: 229 mg/dL — ABNORMAL HIGH (ref 65–99)
Glucose-Capillary: 88 mg/dL (ref 65–99)
Glucose-Capillary: 99 mg/dL (ref 65–99)

## 2017-04-29 LAB — CBC
HCT: 32.5 % — ABNORMAL LOW (ref 36.0–46.0)
HEMOGLOBIN: 10.9 g/dL — AB (ref 12.0–15.0)
MCH: 29.9 pg (ref 26.0–34.0)
MCHC: 33.5 g/dL (ref 30.0–36.0)
MCV: 89 fL (ref 78.0–100.0)
Platelets: 196 10*3/uL (ref 150–400)
RBC: 3.65 MIL/uL — AB (ref 3.87–5.11)
RDW: 12.4 % (ref 11.5–15.5)
WBC: 21.9 10*3/uL — ABNORMAL HIGH (ref 4.0–10.5)

## 2017-04-29 LAB — BASIC METABOLIC PANEL
Anion gap: 7 (ref 5–15)
BUN: 20 mg/dL (ref 6–20)
CHLORIDE: 99 mmol/L — AB (ref 101–111)
CO2: 28 mmol/L (ref 22–32)
CREATININE: 0.35 mg/dL — AB (ref 0.44–1.00)
Calcium: 9.9 mg/dL (ref 8.9–10.3)
GFR calc non Af Amer: >60 mL/min (ref >60)
Glucose, Bld: 220 mg/dL — ABNORMAL HIGH (ref 65–99)
POTASSIUM: 4.1 mmol/L (ref 3.5–5.1)
Sodium: 134 mmol/L — ABNORMAL LOW (ref 135–145)

## 2017-04-29 LAB — ECHOCARDIOGRAM COMPLETE
Height: 66 in
WEIGHTICAEL: 1992.96 [oz_av]

## 2017-04-29 LAB — PHOSPHORUS: PHOSPHORUS: 2.6 mg/dL (ref 2.5–4.6)

## 2017-04-29 LAB — MAGNESIUM: MAGNESIUM: 1.7 mg/dL (ref 1.7–2.4)

## 2017-04-29 NOTE — Progress Notes (Addendum)
PULMONARY / CRITICAL CARE MEDICINE   Name: Rachel [name] MRN: [id number] DOB: 1950/02/25    ADMISSION DATE:  04/19/2017 CONSULTATION DATE:  04/23/17  REFERRING MD:  Dr. Trenton Gammon   CHIEF COMPLAINT:  Respiratory Insufficiency   HISTORY OF PRESENT ILLNESS:  67 y/o F who presented 3/11 with a 2 day history of worsening right sided weakness.    At baseline, she is dependent for all ADL's. She required assistance to walk, has to be fed.  She has a history of Rosai Dorfman and had previously undergone an occipital craniotomy for removal of lesions in the past.  She recently had a stable appearance of meningeal lesions in the left middle cranial fossa.  She has a hx of schizophrenia and OSA but not on CPAP after at trial with no improvement.    Initial ER evaluation included a CT of the head which showed extensive left sided vasogenic edema involving the external capsule extending to the temporal lobe, parietal lobe with associated mass-effect with compression of the left parietal ventricle, 52mm left to right midline shift. She was treated with IV decadron and evaluated by NSGY. 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 Post procedure, she failed extubation and required re-intubation.  She was returned to ICU on mechanical ventilation.     PCCM consulted for post-operative ICU care.   SUBJECTIVE: This is day 10 status post craniotomy.  He has had no acute events overnight.  She remains intubated and mechanically ventilated and very rapidly failed a spontaneous breathing trial this morning.  She has had no overt clinical seizure activity.  VITAL SIGNS: BP (!) 146/72 (BP Location: Left Arm)   Pulse (!) 49   Temp 98 F (36.7 C) (Axillary)   Resp 14   Ht 5\' 6"  (1.676 m)   Wt 124 lb 9 oz (56.5 kg)   SpO2 100%   BMI 20.10 kg/m   HEMODYNAMICS:    VENTILATOR SETTINGS: Vent Mode: PRVC FiO2 (%):  [30 %] 30 % Set Rate:  [14  bmp] 14 bmp Vt Set:  [480 mL] 480 mL PEEP:  [5 cmH20] 5 cmH20 Pressure Support:  [10 cmH20] 10 cmH20 Plateau Pressure:  [13 cmH20-16 cmH20] 13 cmH20  INTAKE / OUTPUT: I/O last 3 completed shifts: In: 3175.7 [I.V.:950.7; NG/GT:1925; IV Piggyback:300] Out: 1497 [Urine:3650]  PHYSICAL EXAMINATION:  General: This is a chronically ill-appearing patient who remains intubated, mechanically ventilated, and unresponsive. Neuro: She is drooling and obviously not managing her own upper airway secretions.  There is no response to voice.  There is some movement of the left upper extremity and left lower extremity in response to sternal rub.  Pupils are equal and there is a left gaze preference. HEENT: Endotracheal tube in place.  Left scalp incision is clean and dry without redness warmth or drainage  Cardiovascular: S1 and S2 are regular without murmur rub or gallop  Lungs: Respirations are unlabored, there is symmetric air movement, no wheezes.   Abdomen: Soft, nontender, positive bowel sounds Musculoskeletal: No deformity Skin: No rash  LABS:  BMET Recent Labs  Lab 04/27/17 0347 04/28/17 0404 04/29/17 0533  NA 138 136 134*  K 4.2 3.7 4.1  CL 107 103 99*  CO2 25 25 28   BUN 15 16 20   CREATININE 0.35* <0.30* 0.35*  GLUCOSE 170* 121* 220*    Electrolytes Recent Labs  Lab 04/24/17 0454 04/25/17 0501  04/26/17 0911 04/27/17 0347  04/28/17 0404 04/29/17 0533  CALCIUM 9.2 8.8*   < >  --  9.1 9.6 9.9  MG 2.0 1.9  --  1.8 1.8  --  1.7  PHOS 1.5* 1.2*  --   --   --   --  2.6   < > = values in this interval not displayed.    CBC Recent Labs  Lab 04/27/17 0347 04/28/17 0404 04/29/17 0533  WBC 16.6* 21.0* 21.9*  HGB 10.1* 11.0* 10.9*  HCT 29.6* 33.3* 32.5*  PLT 135* 173 196    Coag's No results for input(s): APTT, INR in the last 168 hours.  Sepsis Markers Recent Labs  Lab 04/24/17 1041 04/25/17 0501 04/25/17 1555 04/26/17 0437 04/27/17 0347  LATICACIDVEN 1.0 1.2   --   --   --   PROCALCITON  --   --  <0.10 <0.10 <0.10    ABG Recent Labs  Lab 04/25/17 1500 04/26/17 0940 04/28/17 0330  PHART 7.505* 7.516* 7.480*  PCO2ART 31.7* 32.8 40.4  PO2ART 211.0* 211.0* 139*    Liver Enzymes Recent Labs  Lab 04/26/17 0437  AST 26  ALT 44  ALKPHOS 49  BILITOT 0.7  ALBUMIN 2.0*    Cardiac Enzymes No results for input(s): TROPONINI, PROBNP in the last 168 hours.  Glucose Recent Labs  Lab 04/28/17 1156 04/28/17 1551 04/28/17 2003 04/28/17 2333 04/29/17 0406 04/29/17 0821  GLUCAP 157* 123* 103* 175* 229* 188*    Imaging Mr Brain W Wo Contrast  Result Date: 04/28/2017 CLINICAL DATA:  Altered mental status EXAM: MRI HEAD WITHOUT AND WITH CONTRAST TECHNIQUE: Multiplanar, multiecho pulse sequences of the brain and surrounding structures were obtained without and with intravenous contrast. CONTRAST:  70mL MULTIHANCE GADOBENATE DIMEGLUMINE 529 MG/ML IV SOLN COMPARISON:  Head CT 04/26/2017 Brain MRI 04/19/2017 FINDINGS: Brain: Partially empty sella. Remote suboccipital craniectomy. Signal abnormality on diffusion-weighted imaging along the peripheral left temporal lobe at the site of recent tumor resection. There are small foci of abnormal diffusion restriction within both parietal lobes. There is mass effect on the left lateral ventricle. Vasogenic edema within the left parietal and temporal white matter extending along the left external capsule. No age-advanced or lobar predominant atrophy. Foci of chronic microhemorrhage in the left cerebellum. Extensive susceptibility of the left temporal lobe at the resection site. There is persistent subacute blood extra axially at the resection site. The post-contrast axial T1-weighted images are degraded by motion, but there is no discrete enhancement at the resection site. The areas of T1 shortening in this area all are intrinsic. Rightward midline shift is unchanged, measuring 4 mm at the foramina of Monro.  Vascular: Major intracranial arterial and venous sinus flow voids are preserved. Skull and upper cervical spine: Left pterional craniotomy. Sinuses/Orbits: No fluid levels or advanced mucosal thickening. No mastoid or middle ear effusion. Normal orbits. IMPRESSION: 1. Multiple punctate foci of acute ischemia within both parietal lobes, likely embolic infarcts. 2. Persistent subacute blood products overlying the left temporal lobe resection site with unchanged vasogenic edema in the left temporal and parietal white matter and along the left external capsule. The diffusion abnormality along the periphery of the left temporal lobe is likely artifactual secondary to susceptibility effects from the blood products there. 3. No residual contrast enhancing tumor. 4. Unchanged mass effect with 4 mm rightward midline shift. Electronically Signed   By: Ulyses Jarred M.D.   On: 04/28/2017 17:48     STUDIES:  CT Head 3/11 >> Extensive left-sided vasogenic edema involving  external capsule, left-sided centrum semi ovale, extending into the temporal lobe and parietal lobe. There is associated mass effect with compression of the left lateral ventricle and 3 mm of left-to-right midline shift at the foramen of Monro. Indistinctness of the left-sided sulci may be secondary to mass effect, however, new metastatic disease or manifestation of the patient's known histiocytosis may be the underlying etiology EEG 3/20 > focal slowing over left hemisphere and mild generalized background slowing, c/w focal cerebral disturbance on left with superimposed encephalopathy pattern.  CULTURES:   ANTIBIOTICS: Cefazolin 3/15 x 1  SIGNIFICANT EVENTS: 3/11 Admit with progressive weakness, edema on CT head  3/15 Crani for resection of tumor, failed extubation   LINES/TUBES: ETT 3/15 >>   DISCUSSION: 67 y/o F admitted 3/11 with increasing weakness.  Known hx of Rosai Dorfman with previously stable CT head.  On admit found to have  new edema with midline shift.  To OR 3/15 for left craniotomy for resection of tumor.  Mental status remains poor postoperatively precluding extubation.  ASSESSMENT / PLAN:  PULMONARY A: Acute Respiratory Insufficiency  OSA - not on CPAP  4th Rib Fracture - noted on CXR  P:   Continue current ventilator support, will need time before can start weaning given cerebral edema Might need trach depending on timing of when she starts to show some improvement Wean PEEP / FiO2 for sats > 90% Chest x-rays pristinely clear without infiltrate   CARDIOVASCULAR A:  Hx PAF, HTN, HLD  P:  SBP goal less than 170 per neurosurgery Metoprolol scheduled every 6 hours Labetalol and hydralazine if needed for blood pressure control No anticoagulation  RENAL A:   Hypokalemia - resolved Anion gap metabolic acidosis - resolved    GASTROINTESTINAL A:   At risk Malnutrition  SUP P:   Tube feeding Protonix for GI prophylaxis  HEMATOLOGIC A:   VTE prophylaxis P:  Follow CBC SCD in place  INFECTIOUS A:   No indication of infection P:   Monitor surgical site  ENDOCRINE A:   DM  P:    Sliding scale insulin per protocol Continue Lantus as ordered.  We may need to intensify therapy with a combination of tube feeding and Decadron  NEUROLOGIC A:   Left Craniotomy s/p Tumor Removal  Cerebral Edema with Midline Shift  Rosai Dorfman Syndrome  P:   Continue supportive care, per neuro they suspect that she will take some time to improve given degree of cerebral edema she continues on Decadron and I have discontinued her half-normal carrier hoping that her serum sodium will rise.  If not I will intervene.    Addendum: MRI shows multiple acute event suggestive of embolic infarcts.  Echocardiogram ordered, will discuss with neurology    Lars Masson, MD [facility] Pager: [phone]  or [phone] 04/29/2017, 9:58 AM

## 2017-04-29 NOTE — Progress Notes (Signed)
Patient ID: Rachel [name], female   DOB: Nov 21, 1950, 67 y.o.   MRN: [id number] Patient essentially more awake doesn't really follow some commandsbut remains intubated elbow they are trying to wean her off the vent.  No significant neurologic change

## 2017-04-29 NOTE — Progress Notes (Signed)
  Echocardiogram 2D Echocardiogram has been performed.  Rachel [name] T Rachel [name] 04/29/2017, 11:34 AM

## 2017-04-30 ENCOUNTER — Inpatient Hospital Stay (HOSPITAL_COMMUNITY): Payer: BLUE CROSS/BLUE SHIELD

## 2017-04-30 LAB — PROCALCITONIN

## 2017-04-30 LAB — CBC WITH DIFFERENTIAL/PLATELET
BASOS ABS: 0 10*3/uL (ref 0.0–0.1)
Basophils Relative: 0 %
EOS PCT: 0 %
Eosinophils Absolute: 0 10*3/uL (ref 0.0–0.7)
HEMATOCRIT: 33 % — AB (ref 36.0–46.0)
Hemoglobin: 11.3 g/dL — ABNORMAL LOW (ref 12.0–15.0)
LYMPHS ABS: 1.6 10*3/uL (ref 0.7–4.0)
LYMPHS PCT: 6 %
MCH: 30.4 pg (ref 26.0–34.0)
MCHC: 34.2 g/dL (ref 30.0–36.0)
MCV: 88.7 fL (ref 78.0–100.0)
Monocytes Absolute: 2.9 10*3/uL — ABNORMAL HIGH (ref 0.1–1.0)
Monocytes Relative: 12 %
NEUTROS ABS: 20.4 10*3/uL — AB (ref 1.7–7.7)
Neutrophils Relative %: 82 %
PLATELETS: 221 10*3/uL (ref 150–400)
RBC: 3.72 MIL/uL — AB (ref 3.87–5.11)
RDW: 12.3 % (ref 11.5–15.5)
WBC: 24.9 10*3/uL — AB (ref 4.0–10.5)

## 2017-04-30 LAB — COMPREHENSIVE METABOLIC PANEL
ALT: 71 U/L — ABNORMAL HIGH (ref 14–54)
AST: 29 U/L (ref 15–41)
Albumin: 2.1 g/dL — ABNORMAL LOW (ref 3.5–5.0)
Alkaline Phosphatase: 63 U/L (ref 38–126)
Anion gap: 7 (ref 5–15)
BILIRUBIN TOTAL: 0.6 mg/dL (ref 0.3–1.2)
BUN: 20 mg/dL (ref 6–20)
CHLORIDE: 101 mmol/L (ref 101–111)
CO2: 30 mmol/L (ref 22–32)
CREATININE: 0.34 mg/dL — AB (ref 0.44–1.00)
Calcium: 10.7 mg/dL — ABNORMAL HIGH (ref 8.9–10.3)
Glucose, Bld: 134 mg/dL — ABNORMAL HIGH (ref 65–99)
POTASSIUM: 4.3 mmol/L (ref 3.5–5.1)
Sodium: 138 mmol/L (ref 135–145)
TOTAL PROTEIN: 4.7 g/dL — AB (ref 6.5–8.1)

## 2017-04-30 LAB — GLUCOSE, CAPILLARY
GLUCOSE-CAPILLARY: 120 mg/dL — AB (ref 65–99)
GLUCOSE-CAPILLARY: 123 mg/dL — AB (ref 65–99)
GLUCOSE-CAPILLARY: 130 mg/dL — AB (ref 65–99)
GLUCOSE-CAPILLARY: 136 mg/dL — AB (ref 65–99)
Glucose-Capillary: 123 mg/dL — ABNORMAL HIGH (ref 65–99)
Glucose-Capillary: 161 mg/dL — ABNORMAL HIGH (ref 65–99)

## 2017-04-30 LAB — BLOOD GAS, ARTERIAL
Acid-Base Excess: 0.2 mmol/L (ref 0.0–2.0)
BICARBONATE: 24.5 mmol/L (ref 20.0–28.0)
Drawn by: 511551
FIO2: 40
LHR: 21 {breaths}/min
O2 SAT: 94.1 %
PATIENT TEMPERATURE: 98.4
PCO2 ART: 40.4 mmHg (ref 32.0–48.0)
PEEP: 7 cmH2O
VT: 480 mL
pH, Arterial: 7.399 (ref 7.350–7.450)
pO2, Arterial: 69.7 mmHg — ABNORMAL LOW (ref 83.0–108.0)

## 2017-04-30 LAB — TSH: TSH: 1.017 u[IU]/mL (ref 0.350–4.500)

## 2017-04-30 LAB — PTH-RELATED PEPTIDE

## 2017-04-30 MED ORDER — ALBUTEROL SULFATE (2.5 MG/3ML) 0.083% IN NEBU: 2.5000 mg | INHALATION_SOLUTION | RESPIRATORY_TRACT | Status: DC | PRN

## 2017-04-30 NOTE — Progress Notes (Signed)
Nutrition Follow-up  DOCUMENTATION CODES:   Severe malnutrition in context of chronic illness  INTERVENTION:   Continue:  Vital High Protein @ 55 ml/hr (1320 ml/day) MVI daily  Provides: 1320 kcal, 116 grams protein, and 1109 ml free water.    NUTRITION DIAGNOSIS:   Severe Malnutrition related to chronic illness(brain tumor) as evidenced by severe fat depletion, severe muscle depletion, percent weight loss. Ongoing.   GOAL:   Patient will meet greater than or equal to 90% of their needs Met.   MONITOR:   Weight trends, TF tolerance, Labs, I & O's  ASSESSMENT:   67 y/o F who presented 3/11 with a 2 day history of worsening right sided weakness. 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 Post procedure, she failed extubation and required re-intubation. PMH of blindness, DM type 1, GERD, hyperlipidemia, schizophrenia, SAH, HTN, and Rosai Dorfman. At baseline pt dependent for all ADL's.  3/15 intubated  Per MD will take some time for improvement due to swelling.  Per RN stage II on sacrum and no other wounds   MV: 9.4 L/min Temp (24hrs), Avg:97.7 F (36.5 C), Min:97.4 F (36.3 C), Max:98.2 F (36.8 C)   Medications reviewed and include: decadron, 3-9 units novolog every 4 hrs, levemir, MVI  Labs reviewed: CBG's: 123-161     Diet Order:  Fall precautions Seizure precautions Aspiration precautions Diet NPO time specified  EDUCATION NEEDS:   Not appropriate for education at this time  Skin:  Skin Assessment: Skin Integrity Issues: Skin Integrity Issues:: Stage II, Incisions Stage II: sacrum Incisions: head  Last BM:  3/22 small   Height:   Ht Readings from Last 1 Encounters:  04/25/17 5' 6"  (1.676 m)    Weight:   Wt Readings from Last 1 Encounters:  04/30/17 126 lb 12.2 oz (57.5 kg)    Ideal Body Weight:  59.1 kg  BMI:  Body mass index is 20.46 kg/m.  Estimated Nutritional  Needs:   Kcal:  1313  Protein:  100-115g  Fluid:  >1.3L  Maylon Peppers RD, [place], [place] Pager [phone] After Hours Pager

## 2017-04-30 NOTE — Progress Notes (Addendum)
Subjective: Remains intubated without sedation. Follows no commands. Opens eyes to noxious stimuli and moves left leg volitionally.  Mostly left gaze preference but occasionally able to cross midline. No seizure activity reported overnight. No family at bedside  Exam: Vitals:   04/30/17 0600 04/30/17 0700  BP: 139/73 (!) 143/82  Pulse: (!) 55 (!) 53  Resp: 14 13  Temp:    SpO2: 100% 100%   Physical Exam   HEENT-  Normocephalic, incision with staples on the left, .  Normal external eye and conjunctiva.   Cardiovascular- S1-S2 audible, pulses palpable throughout   Lungs-no rhonchi or wheezing noted, no excessive working breathing.  Saturations within normal limits Neuro:  Mental Status: Remains ventilated but not following commands. Opens eyes to noxious stimuli and moves left leg volitionally. Makes no attempt to speak or communicate with examiner Cranial Nerves: II: blinks to threat bilaterally,  III,IV, VI: ptosis not present,  Left gaze preference but occasionally able to cross midline. Does not track examiner.  bilaterally pupils equal, round, reactive to light and accommodation V,VII: face symmetric, VIII: hearing normal bilaterally  Motor: Moves bilateral arms 2/5 and left leg 3/5 volitionally to noxious stimuli. Sensory: Pinprick and light touch intact throughout, bilaterally Deep Tendon Reflexes: 2+ and symmetric throughout Plantars: Right: downgoing   Left: downgoing  Pertinent Labs/Diagnostics: EEG SUMMARY: This was an abnormal continuous video EEG due to focal slowing/asymmetric background over the left hemisphere as well as some mild generalized background slowing. This was indicative of a focal cerebral disturbance on the left with a mild superimposed encephalopathy pattern. There were no epileptiform discharges  Mr Jeri Cos Wo Contrast  Result Date: 04/28/2017 CLINICAL DATA:  Altered mental status EXAM: MRI HEAD WITHOUT AND WITH CONTRAST TECHNIQUE: Multiplanar,  multiecho pulse sequences of the brain and surrounding structures were obtained without and with intravenous contrast. CONTRAST:  77mL MULTIHANCE GADOBENATE DIMEGLUMINE 529 MG/ML IV SOLN COMPARISON:  Head CT 04/26/2017 Brain MRI 04/19/2017 FINDINGS: Brain: Partially empty sella. Remote suboccipital craniectomy. Signal abnormality on diffusion-weighted imaging along the peripheral left temporal lobe at the site of recent tumor resection. There are small foci of abnormal diffusion restriction within both parietal lobes. There is mass effect on the left lateral ventricle. Vasogenic edema within the left parietal and temporal white matter extending along the left external capsule. No age-advanced or lobar predominant atrophy. Foci of chronic microhemorrhage in the left cerebellum. Extensive susceptibility of the left temporal lobe at the resection site. There is persistent subacute blood extra axially at the resection site. The post-contrast axial T1-weighted images are degraded by motion, but there is no discrete enhancement at the resection site. The areas of T1 shortening in this area all are intrinsic. Rightward midline shift is unchanged, measuring 4 mm at the foramina of Monro. Vascular: Major intracranial arterial and venous sinus flow voids are preserved. Skull and upper cervical spine: Left pterional craniotomy. Sinuses/Orbits: No fluid levels or advanced mucosal thickening. No mastoid or middle ear effusion. Normal orbits. IMPRESSION: 1. Multiple punctate foci of acute ischemia within both parietal lobes, likely embolic infarcts. 2. Persistent subacute blood products overlying the left temporal lobe resection site with unchanged vasogenic edema in the left temporal and parietal white matter and along the left external capsule. The diffusion abnormality along the periphery of the left temporal lobe is likely artifactual secondary to susceptibility effects from the blood products there. 3. No residual contrast  enhancing tumor. 4. Unchanged mass effect with 4 mm rightward midline shift. Electronically Signed  By: Ulyses Jarred M.D.   On: 04/28/2017 17:48   Dg Chest Port 1 View  Result Date: 04/29/2017 CLINICAL DATA:  Respiratory failure EXAM: PORTABLE CHEST 1 VIEW COMPARISON:  04/27/2017 FINDINGS: Endotracheal tube in good position. Feeding tube enters the stomach with the tip not visualized. Progression of left lower lobe atelectasis. Negative for edema or effusion. Right lung clear. IMPRESSION: Endotracheal tube in good position. Progressive left lower lobe atelectasis. Electronically Signed   By: Franchot Gallo M.D.   On: 04/29/2017 11:09   Impression: 67 year old female with past medical history of recent tumor resection on the left parietal dura.  Postoperatively patient has significant amount of edema with a 3 mm shift from left to right-involving the left parietal and temporal lobe.  No active seizure activity reported overnight. No epileptiform activity seen on LTM.Marland Kitchen Patient awake but unresponsive to examiner. Follows no commands. Makes no effort to speak or communicate  -- Renie Ora [facility] 04/30/2017, 8:08 AM  Attending Neurohospitalist Addendum Patient seen and examined with APP/Resident. Agree with the history and physical as documented above. Agree with the plan as documented, which I helped formulate. I have independently reviewed the chart, obtained history, review of systems and examined the patient.I have personally reviewed pertinent head/neck/spine imaging (CT/MRI).  LTM EEG showed slowing with no seizures, hence was discontinued. Imaging reveals significant swelling in the left partieto-temporal region along with blood products to some degree, which might be causing her encephalopathy and also aphasia. At baseline, she is poor functional status, fully dependent for all ADLs.  The most recent MRI 3/20 shows scattered embolic strokes in the right  hemisphere, which could have been caused by underlying arrhythmia or vascular stenosis. I do not think the strokes are in any way contributing to her picture and are purely incidental. Given her poor baseline, improvement, how much ever it might be, will be slow given the poor baseline and extensive atrophy and cerebral edema. I would continue AEDs and Decadron as you are currently pursuing. It might be worthwhile to contact and consult palliative medicine for [place] discussion. Neurology will be available as needed.  Please feel free to call with any questions. --- Amie Portland, MD [facility] Pager: [phone]  If 7pm to 7am, please call on call as listed on AMION.  CRITICAL CARE ATTESTATION This patient is critically ill and at significant risk of neurological worsening, death and care requires constant monitoring of vital signs, hemodynamics,respiratory and cardiac monitoring. I spent 30  minutes of neurocritical care time performing neurological assessment, discussion with family, other specialists and medical decision making of high complexityin the care of  this patient.

## 2017-04-30 NOTE — Progress Notes (Signed)
PULMONARY / CRITICAL CARE MEDICINE   Name: Rachel [name] MRN: [id number] DOB: Jun 16, 1950    ADMISSION DATE:  04/19/2017 CONSULTATION DATE:  04/23/17  REFERRING MD:  Dr. Trenton Gammon   CHIEF COMPLAINT:  Respiratory Insufficiency   HISTORY OF PRESENT ILLNESS:  67 y/o F who presented 3/11 with a 2 day history of worsening right sided weakness.    At baseline, she is dependent for all ADL's. She required assistance to walk, has to be fed.  She has a history of Rosai Dorfman and had previously undergone an occipital craniotomy for removal of lesions in the past.  She recently had a stable appearance of meningeal lesions in the left middle cranial fossa.  She has a hx of schizophrenia and OSA but not on CPAP after at trial with no improvement.    Initial ER evaluation included a CT of the head which showed extensive left sided vasogenic edema involving the external capsule extending to the temporal lobe, parietal lobe with associated mass-effect with compression of the left parietal ventricle, 40mm left to right midline shift. She was treated with IV decadron and evaluated by NSGY. 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 Post procedure, she failed extubation and required re-intubation.  She was returned to ICU on mechanical ventilation.     PCCM consulted for post-operative ICU care.   SUBJECTIVE: This is day 11 status post craniotomy.  There have been no significant events overnight.  She remains intubated but is on a pressure support of 5 and breathing comfortably.  She is not receiving sedation, and she is not interactive.   VITAL SIGNS: BP (!) 142/81   Pulse 61   Temp (!) 97.4 F (36.3 C) (Axillary)   Resp 13   Ht 5\' 6"  (1.676 m)   Wt 126 lb 12.2 oz (57.5 kg)   SpO2 100%   BMI 20.46 kg/m   HEMODYNAMICS:    VENTILATOR SETTINGS: Vent Mode: PSV;CPAP FiO2 (%):  [30 %] 30 % Set Rate:  [14 bmp] 14 bmp Vt Set:  [480  mL] 480 mL PEEP:  [5 cmH20] 5 cmH20 Pressure Support:  [5 cmH20] 5 cmH20 Plateau Pressure:  [13 cmH20-15 cmH20] 15 cmH20  INTAKE / OUTPUT: I/O last 3 completed shifts: In: 2480 [I.V.:130; NG/GT:2050; IV Piggyback:300] Out: 2050 [Urine:2050]  PHYSICAL EXAMINATION:  General: This is a chronically ill-appearing patient who remains intubated, mechanically ventilated, and unresponsive. Neuro: There is spontaneous eye opening but no response to voice or instructions.  Pupils are equal.  She does move both upper extremities in response to noxious stimuli.  HEENT: Endotracheal tube in place.  Left scalp incision is clean and dry without redness warmth or drainage  Cardiovascular: S1 and S2 are regular without murmur rub or gallop  Lungs: Respirations are unlabored on a pressure support of 5, there is symmetric air movement, no wheezes, the lungs are pristinely clear.     Abdomen: Soft, nontender, positive bowel sounds Musculoskeletal: No deformity Skin: No rash  LABS:  BMET Recent Labs  Lab 04/28/17 0404 04/29/17 0533 04/30/17 0352  NA 136 134* 138  K 3.7 4.1 4.3  CL 103 99* 101  CO2 25 28 30   BUN 16 20 20   CREATININE <0.30* 0.35* 0.34*  GLUCOSE 121* 220* 134*    Electrolytes Recent Labs  Lab 04/24/17 0454 04/25/17 0501  04/26/17 0911 04/27/17 0347 04/28/17 0404 04/29/17 0533 04/30/17 0352  CALCIUM 9.2 8.8*   < >  --  9.1 9.6 9.9 10.7*  MG 2.0 1.9  --  1.8 1.8  --  1.7  --   PHOS 1.5* 1.2*  --   --   --   --  2.6  --    < > = values in this interval not displayed.    CBC Recent Labs  Lab 04/28/17 0404 04/29/17 0533 04/30/17 0621  WBC 21.0* 21.9* 24.9*  HGB 11.0* 10.9* 11.3*  HCT 33.3* 32.5* 33.0*  PLT 173 196 221    Coag's No results for input(s): APTT, INR in the last 168 hours.  Sepsis Markers Recent Labs  Lab 04/24/17 1041 04/25/17 0501 04/25/17 1555 04/26/17 0437 04/27/17 0347  LATICACIDVEN 1.0 1.2  --   --   --   PROCALCITON  --   --  <0.10  <0.10 <0.10    ABG Recent Labs  Lab 04/26/17 0940 04/28/17 0330 04/30/17 0304  PHART 7.516* 7.480* 7.399  PCO2ART 32.8 40.4 40.4  PO2ART 211.0* 139* 69.7*    Liver Enzymes Recent Labs  Lab 04/26/17 0437 04/30/17 0352  AST 26 29  ALT 44 71*  ALKPHOS 49 63  BILITOT 0.7 0.6  ALBUMIN 2.0* 2.1*    Cardiac Enzymes No results for input(s): TROPONINI, PROBNP in the last 168 hours.  Glucose Recent Labs  Lab 04/29/17 1637 04/29/17 2005 04/29/17 2343 04/30/17 0332 04/30/17 0734 04/30/17 1129  GLUCAP 99 88 127* 136* 123* 161*    Imaging No results found.   STUDIES:  CT Head 3/11 >> Extensive left-sided vasogenic edema involving external capsule, left-sided centrum semi ovale, extending into the temporal lobe and parietal lobe. There is associated mass effect with compression of the left lateral ventricle and 3 mm of left-to-right midline shift at the foramen of Monro. Indistinctness of the left-sided sulci may be secondary to mass effect, however, new metastatic disease or manifestation of the patient's known histiocytosis may be the underlying etiology EEG 3/20 > focal slowing over left hemisphere and mild generalized background slowing, c/w focal cerebral disturbance on left with superimposed encephalopathy pattern.  CULTURES:   ANTIBIOTICS: Cefazolin 3/15 x 1  SIGNIFICANT EVENTS: 3/11 Admit with progressive weakness, edema on CT head  3/15 Crani for resection of tumor, failed extubation   LINES/TUBES: ETT 3/15 >>   DISCUSSION: 67 y/o F admitted 3/11 with increasing weakness.  Known hx of Rosai Dorfman with previously stable CT head.  On admit found to have new edema with midline shift.  To OR 3/15 for left craniotomy for resection of tumor.  Mental status remains poor postoperatively precluding extubation.  ASSESSMENT / PLAN:  PULMONARY A: Acute Respiratory Insufficiency  OSA - not on CPAP  4th Rib Fracture - noted on CXR  P:   Continue current  ventilator support, will need time before can start weaning given cerebral edema Might need trach depending on timing of when she starts to show some improvement Wean PEEP / FiO2 for sats > 90% Chest x-rays pristinely clear without infiltrate   CARDIOVASCULAR A:  Hx PAF, HTN, HLD  P:  SBP goal less than 170 per neurosurgery Metoprolol scheduled every 6 hours Labetalol and hydralazine if needed for blood pressure control No anticoagulation  RENAL A:   Hypokalemia - resolved Anion gap metabolic acidosis - resolved    GASTROINTESTINAL A:   At risk Malnutrition  SUP P:   Tube feeding Protonix for GI prophylaxis  HEMATOLOGIC A:   VTE prophylaxis P:  Follow CBC SCD in place  INFECTIOUS A:  No indication of infection P:   Monitor surgical site  ENDOCRINE A:   DM  P:    Sliding scale insulin per protocol at this point we have good glycemic control. Continue Lantus as ordered.   NEUROLOGIC A:   Left Craniotomy s/p Tumor Removal  Cerebral Edema with Midline Shift  Rosai Dorfman Syndrome  P:   Continue supportive care, per neuro they suspect that she will take some time to improve given degree of cerebral edema. She continues on Decadron and I have discontinued her half-normal carrier hoping that her serum sodium will rise.  Neurologic progress is extremely slow and her baseline is somewhat limited.  I asked her husband today if you would like [place] to proceed with tracheostomy if I am unable to separate her from mechanical ventilation in the next several days to at least begin the discussion as to what level of support would be appropriate.  In the meantime I am going to make certain that we do not have a metabolic contributor to her poor mental status.  I am going to again screen for infection with a chest x-ray and pro-calcitonin and obtain a serum ammonia.   Lars Masson, MD [facility] Pager: [phone]  or [phone] 04/30/2017, 1:22 PM

## 2017-05-01 LAB — GLUCOSE, CAPILLARY
GLUCOSE-CAPILLARY: 166 mg/dL — AB (ref 65–99)
GLUCOSE-CAPILLARY: 84 mg/dL (ref 65–99)
Glucose-Capillary: 128 mg/dL — ABNORMAL HIGH (ref 65–99)
Glucose-Capillary: 130 mg/dL — ABNORMAL HIGH (ref 65–99)
Glucose-Capillary: 169 mg/dL — ABNORMAL HIGH (ref 65–99)
Glucose-Capillary: 189 mg/dL — ABNORMAL HIGH (ref 65–99)

## 2017-05-01 LAB — AMMONIA: Ammonia: 33 umol/L (ref 9–35)

## 2017-05-01 LAB — PROCALCITONIN

## 2017-05-01 MED ORDER — FUROSEMIDE 10 MG/ML IJ SOLN
10.0000 mg | Freq: Three times a day (TID) | INTRAMUSCULAR | Status: DC
  Administered 2017-05-01 – 2017-05-04 (×9): 10 mg via INTRAVENOUS
  Filled 2017-05-01 (×9): qty 2

## 2017-05-01 NOTE — Progress Notes (Signed)
Patient ID: Rachel [name], female   DOB: 1950/07/31, 67 y.o.   MRN: [id number] BP (!) 143/77   Pulse (!) 55   Temp 98.1 F (36.7 C) (Axillary)   Resp 10   Ht 5\' 6"  (1.676 m)   Wt 57.5 kg (126 lb 12.2 oz)   SpO2 100%   BMI 20.46 kg/m  Intubated, not following commands Closes eyes if one tries to open them Has a cough, gag No neurologic changes

## 2017-05-01 NOTE — Progress Notes (Signed)
PULMONARY / CRITICAL CARE MEDICINE   Name: Rachel [name] MRN: [id number] DOB: 1950-10-03    ADMISSION DATE:  04/19/2017 CONSULTATION DATE:  04/23/17  REFERRING MD:  Dr. Trenton Gammon   CHIEF COMPLAINT:  Respiratory Insufficiency   HISTORY OF PRESENT ILLNESS:  67 y/o F who presented 3/11 with a 2 day history of worsening right sided weakness.    At baseline, she is dependent for all ADL's. She required assistance to walk, has to be fed.  She has a history of Rosai Dorfman and had previously undergone an occipital craniotomy for removal of lesions in the past.  She recently had a stable appearance of meningeal lesions in the left middle cranial fossa.  She has a hx of schizophrenia and OSA but not on CPAP after at trial with no improvement.    Initial ER evaluation included a CT of the head which showed extensive left sided vasogenic edema involving the external capsule extending to the temporal lobe, parietal lobe with associated mass-effect with compression of the left parietal ventricle, 27mm left to right midline shift. She was treated with IV decadron and evaluated by NSGY. 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 Post procedure, she failed extubation and required re-intubation.  She was returned to ICU on mechanical ventilation.     PCCM consulted for post-operative ICU care.   SUBJECTIVE: This is day 12 status post craniotomy.  Failed a spontaneous breathing trial this morning.  She is still not significantly interactive.  She is not receiving sedation.  VITAL SIGNS: BP (!) 141/80   Pulse (!) 59   Temp 97.9 F (36.6 C) (Axillary)   Resp 10   Ht 5\' 6"  (1.676 m)   Wt 126 lb 12.2 oz (57.5 kg)   SpO2 100%   BMI 20.46 kg/m   HEMODYNAMICS:    VENTILATOR SETTINGS: Vent Mode: PRVC FiO2 (%):  [30 %] 30 % Set Rate:  [14 bmp] 14 bmp Vt Set:  [480 mL] 480 mL PEEP:  [5 cmH20] 5 cmH20 Pressure Support:  [10 cmH20] 10  cmH20 Plateau Pressure:  [11 cmH20-13 cmH20] 12 cmH20  INTAKE / OUTPUT: I/O last 3 completed shifts: In: 2145 [NG/GT:2045; IV Piggyback:100] Out: 5573 [Urine:1655]  PHYSICAL EXAMINATION:  General: This is a chronically ill-appearing female who is orally intubated and mechanically ventilated.  She is only minimally responsive  Neuro: There is spontaneous eye opening but no response to voice or instructions.  Pupils are equal.  He has a left gaze preference.  She does move both upper extremities in response to noxious stimuli.  HEENT: There is an endotracheal tube in place.  The craniotomy site is clean and dry.   Cardiovascular: S1 and S2 are regular without murmur rub or gallop  Lungs: Respirations are unlabored on full ventilatory support, there is symmetric air movement, no wheezes.  The lungs are clear anteriorly .     Abdomen: Soft and nontender, bowel sounds are present  Musculoskeletal: No deformity Skin: No rash  LABS:  BMET Recent Labs  Lab 04/28/17 0404 04/29/17 0533 04/30/17 0352  NA 136 134* 138  K 3.7 4.1 4.3  CL 103 99* 101  CO2 25 28 30   BUN 16 20 20   CREATININE <0.30* 0.35* 0.34*  GLUCOSE 121* 220* 134*    Electrolytes Recent Labs  Lab 04/25/17 0501  04/26/17 0911 04/27/17 0347 04/28/17 0404 04/29/17 0533 04/30/17 0352  CALCIUM 8.8*   < >  --  9.1 9.6 9.9 10.7*  MG 1.9  --  1.8 1.8  --  1.7  --   PHOS 1.2*  --   --   --   --  2.6  --    < > = values in this interval not displayed.    CBC Recent Labs  Lab 04/28/17 0404 04/29/17 0533 04/30/17 0621  WBC 21.0* 21.9* 24.9*  HGB 11.0* 10.9* 11.3*  HCT 33.3* 32.5* 33.0*  PLT 173 196 221    Coag's No results for input(s): APTT, INR in the last 168 hours.  Sepsis Markers Recent Labs  Lab 04/25/17 0501  04/27/17 0347 04/30/17 1349 05/01/17 0718  LATICACIDVEN 1.2  --   --   --   --   PROCALCITON  --    < > <0.10 <0.10 <0.10   < > = values in this interval not displayed.    ABG Recent  Labs  Lab 04/26/17 0940 04/28/17 0330 04/30/17 0304  PHART 7.516* 7.480* 7.399  PCO2ART 32.8 40.4 40.4  PO2ART 211.0* 139* 69.7*    Liver Enzymes Recent Labs  Lab 04/26/17 0437 04/30/17 0352  AST 26 29  ALT 44 71*  ALKPHOS 49 63  BILITOT 0.7 0.6  ALBUMIN 2.0* 2.1*    Cardiac Enzymes No results for input(s): TROPONINI, PROBNP in the last 168 hours.  Glucose Recent Labs  Lab 04/30/17 1518 04/30/17 2036 04/30/17 2351 05/01/17 0331 05/01/17 0718 05/01/17 1120  GLUCAP 130* 123* 120* 128* 130* 169*    Imaging Dg Chest Port 1 View  Result Date: 04/30/2017 CLINICAL DATA:  Leukocytosis, arm swelling EXAM: PORTABLE CHEST 1 VIEW COMPARISON:  04/29/2017 FINDINGS: There is an endotracheal tube with the tip 3.2 cm above the carina. There is a nasogastric tube coursing below the diaphragm. There is no focal parenchymal opacity. There is no pleural effusion or pneumothorax. The heart and mediastinal contours are unremarkable. The osseous structures are unremarkable. IMPRESSION: 1. Support lines and tubing in satisfactory position. 2. No acute cardiopulmonary disease. Electronically Signed   By: Kathreen Devoid   On: 04/30/2017 14:40     STUDIES:  CT Head 3/11 >> Extensive left-sided vasogenic edema involving external capsule, left-sided centrum semi ovale, extending into the temporal lobe and parietal lobe. There is associated mass effect with compression of the left lateral ventricle and 3 mm of left-to-right midline shift at the foramen of Monro. Indistinctness of the left-sided sulci may be secondary to mass effect, however, new metastatic disease or manifestation of the patient's known histiocytosis may be the underlying etiology EEG 3/20 > focal slowing over left hemisphere and mild generalized background slowing, c/w focal cerebral disturbance on left with superimposed encephalopathy pattern.  CULTURES:   ANTIBIOTICS: Cefazolin 3/15 x 1  SIGNIFICANT EVENTS: 3/11 Admit with  progressive weakness, edema on CT head  3/15 Crani for resection of tumor, failed extubation   LINES/TUBES: ETT 3/15 >>   DISCUSSION: 67 y/o F admitted 3/11 with increasing weakness.  Known hx of Rosai Dorfman with previously stable CT head.  On admit found to have new edema with midline shift.  To OR 3/15 for left craniotomy for resection of tumor.  Mental status remains poor postoperatively precluding extubation and she is failed a spontaneous breathing trial on 3/23.  ASSESSMENT / PLAN:  PULMONARY A: Acute Respiratory Insufficiency  OSA - not on CPAP  4th Rib Fracture - noted on CXR  P:   She has again failed a spontaneous breathing trial despite minimal respiratory  demands.  Started to diurese for substantial fluid accumulation since she has been on Decadron in the hopes of improving mechanics and gas exchange.    CARDIOVASCULAR A:  Hx PAF, HTN, HLD  P:  SBP goal less than 170 per neurosurgery Metoprolol scheduled every 6 hours Labetalol and hydralazine if needed for blood pressure control No anticoagulation  RENAL A:   Hypokalemia - resolved Anion gap metabolic acidosis - resolved    GASTROINTESTINAL A:   At risk Malnutrition  SUP P:   Tube feeding Protonix for GI prophylaxis  HEMATOLOGIC A:   VTE prophylaxis P:  Follow CBC SCD in place  INFECTIOUS A:   No indication of infection P:   Monitor surgical site  ENDOCRINE A:   DM  P:    Sliding scale insulin per protocol at this point we have good glycemic control. Continue Lantus as ordered.   NEUROLOGIC A:   Left Craniotomy s/p Tumor Removal  Cerebral Edema with Midline Shift  Rosai Dorfman Syndrome  P:   Continue supportive care, per neuro as they suspect that she will take some time to improve given degree of cerebral edema.   Neurologic progress is extremely slow and her baseline is somewhat limited.  Unfortunately I am not finding any metabolic contribution to her poor mental status.  Chest  x-ray does not show an infiltrate and procalcitonin is normal.  Liver functions were only remarkable for a very slight elevation in her ALT.  Considering her very poor baseline function, I asked her husband yesterday to consider whether or not tracheostomy will be appropriate if we are unable to separate her from mechanical ventilation.    Lars Masson, MD [facility] Pager: [phone]  or [phone] 05/01/2017, 12:54 PM

## 2017-05-02 ENCOUNTER — Inpatient Hospital Stay (HOSPITAL_COMMUNITY): Payer: BLUE CROSS/BLUE SHIELD

## 2017-05-02 LAB — CBC WITH DIFFERENTIAL/PLATELET
Basophils Absolute: 0 10*3/uL (ref 0.0–0.1)
Basophils Relative: 0 %
EOS ABS: 0 10*3/uL (ref 0.0–0.7)
Eosinophils Relative: 0 %
HCT: 32.3 % — ABNORMAL LOW (ref 36.0–46.0)
HEMOGLOBIN: 10.7 g/dL — AB (ref 12.0–15.0)
LYMPHS PCT: 8 %
Lymphs Abs: 1.3 10*3/uL (ref 0.7–4.0)
MCH: 29.9 pg (ref 26.0–34.0)
MCHC: 33.1 g/dL (ref 30.0–36.0)
MCV: 90.2 fL (ref 78.0–100.0)
MONOS PCT: 6 %
Monocytes Absolute: 0.9 10*3/uL (ref 0.1–1.0)
NEUTROS PCT: 86 %
Neutro Abs: 13.6 10*3/uL — ABNORMAL HIGH (ref 1.7–7.7)
Platelets: 189 10*3/uL (ref 150–400)
RBC: 3.58 MIL/uL — ABNORMAL LOW (ref 3.87–5.11)
RDW: 12.7 % (ref 11.5–15.5)
WBC: 15.8 10*3/uL — ABNORMAL HIGH (ref 4.0–10.5)

## 2017-05-02 LAB — BASIC METABOLIC PANEL
Anion gap: 11 (ref 5–15)
BUN: 25 mg/dL — ABNORMAL HIGH (ref 6–20)
CHLORIDE: 99 mmol/L — AB (ref 101–111)
CO2: 25 mmol/L (ref 22–32)
Calcium: 10 mg/dL (ref 8.9–10.3)
Creatinine, Ser: 0.38 mg/dL — ABNORMAL LOW (ref 0.44–1.00)
GFR calc non Af Amer: >60 mL/min (ref >60)
Glucose, Bld: 102 mg/dL — ABNORMAL HIGH (ref 65–99)
POTASSIUM: 4.7 mmol/L (ref 3.5–5.1)
SODIUM: 135 mmol/L (ref 135–145)

## 2017-05-02 LAB — GLUCOSE, CAPILLARY
GLUCOSE-CAPILLARY: 115 mg/dL — AB (ref 65–99)
GLUCOSE-CAPILLARY: 133 mg/dL — AB (ref 65–99)
GLUCOSE-CAPILLARY: 94 mg/dL (ref 65–99)
Glucose-Capillary: 96 mg/dL (ref 65–99)
Glucose-Capillary: 134 mg/dL — ABNORMAL HIGH (ref 65–99)

## 2017-05-02 LAB — POCT I-STAT 3, ART BLOOD GAS (G3+)
Acid-Base Excess: 7 mmol/L — ABNORMAL HIGH (ref 0.0–2.0)
Bicarbonate: 30.7 mmol/L — ABNORMAL HIGH (ref 20.0–28.0)
O2 Saturation: 96 %
TCO2: 32 mmol/L (ref 22–32)
pCO2 arterial: 39 mmHg (ref 32.0–48.0)
pH, Arterial: 7.505 — ABNORMAL HIGH (ref 7.350–7.450)
pO2, Arterial: 75 mmHg — ABNORMAL LOW (ref 83.0–108.0)

## 2017-05-02 LAB — PROCALCITONIN: Procalcitonin: <0.1 ng/mL

## 2017-05-02 LAB — HEPATITIS PANEL, ACUTE
HEP B S AG: NEGATIVE
Hep A IgM: NEGATIVE
Hep B C IgM: NEGATIVE

## 2017-05-02 MED ORDER — PAROXETINE HCL 20 MG PO TABS
20.0000 mg | ORAL_TABLET | Freq: Every day | ORAL | Status: DC
  Administered 2017-05-02 – 2017-05-09 (×8): 20 mg
  Filled 2017-05-02 (×8): qty 1

## 2017-05-02 NOTE — Progress Notes (Signed)
Patient ID: Rachel [name], female   DOB: 09/20/1950, 67 y.o.   MRN: [id number] BP 131/72   Pulse (!) 54   Temp 98 F (36.7 C) (Axillary)   Resp 16   Ht 5\' 6"  (1.676 m)   Wt 56.5 kg (124 lb 9 oz)   SpO2 99%   BMI 20.10 kg/m  Opens eyes Wound is clean, dry, without signs of infection Respiratory failure, No new recommendations

## 2017-05-02 NOTE — Progress Notes (Signed)
PULMONARY / CRITICAL CARE MEDICINE   Name: Rachel [name] MRN: [id number] DOB: 1950/12/01    ADMISSION DATE:  04/19/2017 CONSULTATION DATE:  04/23/17  REFERRING MD:  Dr. Trenton Gammon   CHIEF COMPLAINT:  Respiratory Insufficiency   HISTORY OF PRESENT ILLNESS:  67 y/o F who presented 3/11 with a 2 day history of worsening right sided weakness.    At baseline, she is dependent for all ADL's. She required assistance to walk, has to be fed.  She has a history of Rosai Dorfman and had previously undergone an occipital craniotomy for removal of lesions in the past.  She recently had a stable appearance of meningeal lesions in the left middle cranial fossa.  She has a hx of schizophrenia and OSA but not on CPAP after at trial with no improvement.    Initial ER evaluation included a CT of the head which showed extensive left sided vasogenic edema involving the external capsule extending to the temporal lobe, parietal lobe with associated mass-effect with compression of the left parietal ventricle, 38mm left to right midline shift. She was treated with IV decadron and evaluated by NSGY. 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 Post procedure, she failed extubation and required re-intubation.  She was returned to ICU on mechanical ventilation.     PCCM consulted for post-operative ICU care.   SUBJECTIVE: This is day 13 status post craniotomy.  She is tolerating a pressure support of 5 and 30% oxygen this morning but is still not at all interactive.  She still has not received any sedation.    VITAL SIGNS: BP 131/72   Pulse (!) 54   Temp 98 F (36.7 C) (Axillary)   Resp 16   Ht 5\' 6"  (1.676 m)   Wt 124 lb 9 oz (56.5 kg)   SpO2 99%   BMI 20.10 kg/m   HEMODYNAMICS:    VENTILATOR SETTINGS: Vent Mode: PSV;CPAP FiO2 (%):  [30 %] 30 % Set Rate:  [14 bmp] 14 bmp Vt Set:  [480 mL] 480 mL PEEP:  [5 cmH20] 5 cmH20 Pressure  Support:  [5 cmH20] 5 cmH20 Plateau Pressure:  [11 cmH20-18 cmH20] 18 cmH20  INTAKE / OUTPUT: I/O last 3 completed shifts: In: 2450 [NG/GT:2050; IV Piggyback:400] Out: 3070 [Urine:3070]  PHYSICAL EXAMINATION:  General: This is a chronically ill-appearing female who is orally intubated and mechanically ventilated.  She is only minimally responsive  Neuro: There is no response to voice, she opens eyes to sternal rub, does not track.  Pupils are equal.  She moves both upper extremities to noxious stimuli but only very lethargically.    HEENT: There is an endotracheal tube in place.  The craniotomy site is clean and dry.   Cardiovascular: S1 and S2 are regular without murmur rub or gallop.    Lungs: Aspirations are unlabored on pressure support of 5, there is symmetric air movement, no wheezes.       Abdomen: Soft and nontender, bowel sounds are present  Musculoskeletal: No deformity Skin: No rash  LABS:  BMET Recent Labs  Lab 04/29/17 0533 04/30/17 0352 05/02/17 0356  NA 134* 138 135  K 4.1 4.3 4.7  CL 99* 101 99*  CO2 28 30 25   BUN 20 20 25*  CREATININE 0.35* 0.34* 0.38*  GLUCOSE 220* 134* 102*    Electrolytes Recent Labs  Lab 04/26/17 0911 04/27/17 0347  04/29/17 0533 04/30/17 0352 05/02/17 6301  CALCIUM  --  9.1   < > 9.9 10.7* 10.0  MG 1.8 1.8  --  1.7  --   --   PHOS  --   --   --  2.6  --   --    < > = values in this interval not displayed.    CBC Recent Labs  Lab 04/29/17 0533 04/30/17 0621 05/02/17 0356  WBC 21.9* 24.9* 15.8*  HGB 10.9* 11.3* 10.7*  HCT 32.5* 33.0* 32.3*  PLT 196 221 189    Coag's No results for input(s): APTT, INR in the last 168 hours.  Sepsis Markers Recent Labs  Lab 04/30/17 1349 05/01/17 0718 05/02/17 0356  PROCALCITON <0.10 <0.10 <0.10    ABG Recent Labs  Lab 04/28/17 0330 04/30/17 0304 05/02/17 0355  PHART 7.480* 7.399 7.505*  PCO2ART 40.4 40.4 39.0  PO2ART 139* 69.7* 75.0*    Liver Enzymes Recent Labs   Lab 04/26/17 0437 04/30/17 0352  AST 26 29  ALT 44 71*  ALKPHOS 49 63  BILITOT 0.7 0.6  ALBUMIN 2.0* 2.1*    Cardiac Enzymes No results for input(s): TROPONINI, PROBNP in the last 168 hours.  Glucose Recent Labs  Lab 05/01/17 1521 05/01/17 1937 05/01/17 2351 05/02/17 0437 05/02/17 0749 05/02/17 1146  GLUCAP 189* 166* 84 115* 133* 94    Imaging Dg Chest Port 1 View  Result Date: 05/02/2017 CLINICAL DATA:  Hypoxia. EXAM: PORTABLE CHEST 1 VIEW COMPARISON:  04/30/2017. FINDINGS: Normal sized heart. Clear lungs with normal vascularity. Minimal scoliosis. Endotracheal tube in satisfactory position. Feeding tube extending into the stomach. Cholecystectomy clips. IMPRESSION: No acute abnormality. Electronically Signed   By: Claudie Revering M.D.   On: 05/02/2017 10:51     STUDIES:  CT Head 3/11 >> Extensive left-sided vasogenic edema involving external capsule, left-sided centrum semi ovale, extending into the temporal lobe and parietal lobe. There is associated mass effect with compression of the left lateral ventricle and 3 mm of left-to-right midline shift at the foramen of Monro. Indistinctness of the left-sided sulci may be secondary to mass effect, however, new metastatic disease or manifestation of the patient's known histiocytosis may be the underlying etiology EEG 3/20 > focal slowing over left hemisphere and mild generalized background slowing, c/w focal cerebral disturbance on left with superimposed encephalopathy pattern.  CULTURES:   ANTIBIOTICS: Cefazolin 3/15 x 1  SIGNIFICANT EVENTS: 3/11 Admit with progressive weakness, edema on CT head  3/15 Crani for resection of tumor, failed extubation   LINES/TUBES: ETT 3/15 >>   DISCUSSION: 67 y/o F admitted 3/11 with increasing weakness.  Known hx of Rosai Dorfman with previously stable CT head.  On admit found to have new edema with midline shift.  To OR 3/15 for left craniotomy for resection of tumor.  Mental status  remains poor postoperatively precluding extubation and she failed a spontaneous breathing trial on 3/23.  She is tolerating a spontaneous breathing trial this morning but is not sufficiently awake to extubate.  I have discussed this with her husband and asked him to consider what to do should we remove the endotracheal tube and she obstructs her airway. I have discussed both the mechanics of performing a tracheostomy but also suggested that some people would not wishes to do so if they were sufficiently obtunded that they could not breathe on her own.  ASSESSMENT / PLAN:  PULMONARY A: Acute Respiratory Insufficiency  OSA - not on CPAP  4th Rib Fracture - noted on CXR  P:  He is intubated at this point solely to provide a safe airway.  CARDIOVASCULAR A:  Hx PAF, HTN, HLD  P:  SBP goal less than 170 per neurosurgery Metoprolol scheduled every 6 hours Labetalol and hydralazine if needed for blood pressure control No anticoagulation  RENAL A:   Hypokalemia - resolved Anion gap metabolic acidosis - resolved    GASTROINTESTINAL A:   At risk Malnutrition  SUP P:   Tube feeding Protonix for GI prophylaxis  HEMATOLOGIC A:   VTE prophylaxis P:  Follow CBC SCD in place  INFECTIOUS A:   No indication of infection P:   Monitor surgical site  ENDOCRINE A:   DM  P:    Sliding scale insulin per protocol at this point we have good glycemic control. Continue Lantus as ordered.   NEUROLOGIC A:   Left Craniotomy s/p Tumor Removal  Cerebral Edema with Midline Shift  Rosai Dorfman Syndrome  P:   Unfortunately I found no concurrent metabolic issues which might be suppressing her sensorium it is now 13 days status post craniotomy without improvement.  Electrolytes, TSH, liver studies and blood gas do not suggest an ongoing provocation for her very poor mental status apart from her structural lesion.  I have reviewed her medications and will decrease her dose of Paxil today but  very much doubt that that will significantly improve her mental status.    Lars Masson, MD [facility] Pager: [phone]  or [phone] 05/02/2017, 11:57 AM

## 2017-05-02 NOTE — Progress Notes (Addendum)
0700 Bedside shift report. Pt resting comfortably on vent, lines tubes, gtts checked and verified. NAD, VSS, fall precautions in place, WCTM.   0800 Pt assessed per flow sheet. Pt moving all 4 extremities, does not follow commands. Pt repositioned in bed. Foley catheter with leak, balloon checked and intact. WCTM.   0815 Pt medicated per MAR.   8416 Pt's husband and family at bedside. Updated with POC, all questions answered. WCTM.   1400 Pt resting comfortably, NAD. WCTM.   1645 Foley again leaking, full linen change and pt bathed. Foley discontinued and purewick inserted. Pt tolerated well.   1800 Pt resting comfortably. NAD, oral care performed and pt able to stick tongue out. Not following any other commands. Post void after foley removal documented. VSS, husband at bedside. Fall precautions in place, lines, tubes, gtts checked and verified. Awaiting night shift RN for report.

## 2017-05-03 LAB — BASIC METABOLIC PANEL
ANION GAP: 8 (ref 5–15)
BUN: 24 mg/dL — ABNORMAL HIGH (ref 6–20)
CALCIUM: 9.9 mg/dL (ref 8.9–10.3)
CO2: 28 mmol/L (ref 22–32)
Chloride: 97 mmol/L — ABNORMAL LOW (ref 101–111)
Creatinine, Ser: 0.42 mg/dL — ABNORMAL LOW (ref 0.44–1.00)
Glucose, Bld: 172 mg/dL — ABNORMAL HIGH (ref 65–99)
Potassium: 3.7 mmol/L (ref 3.5–5.1)
Sodium: 133 mmol/L — ABNORMAL LOW (ref 135–145)

## 2017-05-03 LAB — GLUCOSE, CAPILLARY
GLUCOSE-CAPILLARY: 107 mg/dL — AB (ref 65–99)
GLUCOSE-CAPILLARY: 116 mg/dL — AB (ref 65–99)
GLUCOSE-CAPILLARY: 158 mg/dL — AB (ref 65–99)
GLUCOSE-CAPILLARY: 159 mg/dL — AB (ref 65–99)
GLUCOSE-CAPILLARY: 172 mg/dL — AB (ref 65–99)
GLUCOSE-CAPILLARY: 201 mg/dL — AB (ref 65–99)
Glucose-Capillary: 133 mg/dL — ABNORMAL HIGH (ref 65–99)

## 2017-05-03 NOTE — Progress Notes (Signed)
Patient remains intubated.  Has been tolerating ventilatory weaning for the past 2 days.  She will open her eyes to stimulation.  She moves all extremities purposefully but not to command.  She does not make any obvious efforts to verbalize.  Wound is healing well.  No other issues.  Progressing very slowly following craniotomy for tumor.  Continue current efforts.

## 2017-05-03 NOTE — Progress Notes (Signed)
PULMONARY / CRITICAL CARE MEDICINE   Name: Rachel [name] MRN: [id number] DOB: Jul 18, 1950    ADMISSION DATE:  04/19/2017 CONSULTATION DATE:  04/23/17  REFERRING MD:  Dr. Trenton Gammon   CHIEF COMPLAINT:  Respiratory Insufficiency   HISTORY OF PRESENT ILLNESS:  67 y/o F who presented 3/11 with a 2 day history of worsening right sided weakness.    At baseline, she is dependent for all ADL's. She required assistance to walk, has to be fed.  She has a history of Rosai Dorfman and had previously undergone an occipital craniotomy for removal of lesions in the past.  She recently had a stable appearance of meningeal lesions in the left middle cranial fossa.  She has a hx of schizophrenia and OSA but not on CPAP after at trial with no improvement.    Initial ER evaluation included a CT of the head which showed extensive left sided vasogenic edema involving the external capsule extending to the temporal lobe, parietal lobe with associated mass-effect with compression of the left parietal ventricle, 64mm left to right midline shift. She was treated with IV decadron and evaluated by NSGY. 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 Post procedure, she failed extubation and required re-intubation.  She was returned to ICU on mechanical ventilation.     PCCM consulted for post-operative ICU care.   SUBJECTIVE: This is day 14 status post craniotomy.  Patient remains on PSV of 5cm. Regular respiratory pattern observed. Patient is non communicative.  VITAL SIGNS: BP 129/74   Pulse (!) 56   Temp (!) 97.5 F (36.4 C) (Axillary)   Resp 16   Ht 5\' 6"  (1.676 m)   Wt 55.9 kg (123 lb 3.8 oz)   SpO2 93%   BMI 19.89 kg/m   HEMODYNAMICS:    VENTILATOR SETTINGS: Vent Mode: PSV;CPAP FiO2 (%):  [30 %-100 %] 30 % Set Rate:  [14 bmp] 14 bmp Vt Set:  [480 mL] 480 mL PEEP:  [5 cmH20] 5 cmH20 Pressure Support:  [5 cmH20] 5 cmH20 Plateau  Pressure:  [13 cmH20-18 cmH20] 13 cmH20  INTAKE / OUTPUT: I/O last 3 completed shifts: In: 2355 [NG/GT:2055; IV Piggyback:300] Out: 36 [Urine:3600]  PHYSICAL EXAMINATION:  General: This is a chronically ill-appearing female who is orally intubated and mechanically ventilated.  Patient opens her eyes, but does not follow commands.  Neuro: There is no response to voice, she opens eyes to sternal rub. She withdraws limbs to tactile stimulation. HEENT: There is an endotracheal tube in place.  The craniotomy site is clean and dry. No weepage from the bandage.  Cardiovascular: S1 and S2 are regular without murmur rub or gallop.    Lungs: clear to auscultation. No wheezes, crackles or rhonchi.  Abdomen: Soft and nontender, bowel sounds are present. No organomegaly. Musculoskeletal: warm to touch.  Skin: No rash  LABS:  BMET Recent Labs  Lab 04/30/17 0352 05/02/17 0356 05/03/17 0314  NA 138 135 133*  K 4.3 4.7 3.7  CL 101 99* 97*  CO2 30 25 28   BUN 20 25* 24*  CREATININE 0.34* 0.38* 0.42*  GLUCOSE 134* 102* 172*    Electrolytes Recent Labs  Lab 04/27/17 0347  04/29/17 0533 04/30/17 0352 05/02/17 0356 05/03/17 0314  CALCIUM 9.1   < > 9.9 10.7* 10.0 9.9  MG 1.8  --  1.7  --   --   --   PHOS  --   --  2.6  --   --   --    < > = values in this interval not displayed.    CBC Recent Labs  Lab 04/29/17 0533 04/30/17 0621 05/02/17 0356  WBC 21.9* 24.9* 15.8*  HGB 10.9* 11.3* 10.7*  HCT 32.5* 33.0* 32.3*  PLT 196 221 189    Coag's No results for input(s): APTT, INR in the last 168 hours.  Sepsis Markers Recent Labs  Lab 04/30/17 1349 05/01/17 0718 05/02/17 0356  PROCALCITON <0.10 <0.10 <0.10    ABG Recent Labs  Lab 04/28/17 0330 04/30/17 0304 05/02/17 0355  PHART 7.480* 7.399 7.505*  PCO2ART 40.4 40.4 39.0  PO2ART 139* 69.7* 75.0*    Liver Enzymes Recent Labs  Lab 04/30/17 0352  AST 29  ALT 71*  ALKPHOS 63  BILITOT 0.6  ALBUMIN 2.1*     Cardiac Enzymes No results for input(s): TROPONINI, PROBNP in the last 168 hours.  Glucose Recent Labs  Lab 05/02/17 1522 05/02/17 1944 05/02/17 2346 05/03/17 0335 05/03/17 0820 05/03/17 1230  GLUCAP 96 134* 172* 159* 107* 116*    Imaging No results found.   STUDIES:  CT Head 3/11 >> Extensive left-sided vasogenic edema involving external capsule, left-sided centrum semi ovale, extending into the temporal lobe and parietal lobe. There is associated mass effect with compression of the left lateral ventricle and 3 mm of left-to-right midline shift at the foramen of Monro. Indistinctness of the left-sided sulci may be secondary to mass effect, however, new metastatic disease or manifestation of the patient's known histiocytosis may be the underlying etiology EEG 3/20 > focal slowing over left hemisphere and mild generalized background slowing, c/w focal cerebral disturbance on left with superimposed encephalopathy pattern.  CULTURES:   ANTIBIOTICS: Cefazolin 3/15 x 1  SIGNIFICANT EVENTS: 3/11 Admit with progressive weakness, edema on CT head  3/15 Crani for resection of tumor, failed extubation   LINES/TUBES: ETT 3/15 >>   DISCUSSION: 67 y/o F admitted 3/11 with increasing weakness.  Known hx of Rosai Dorfman with previously stable CT head.  On admit found to have new edema with midline shift.  To OR 3/15 for left craniotomy for resection of tumor.  Mental status remains poor postoperatively precluding extubation and she failed a spontaneous breathing trial on 3/23.  Respiratory pattern is stable on psv of 5cm. Altered mental status precludes extubation at this time.  ASSESSMENT / PLAN:  PULMONARY A: Acute Respiratory Insufficiency  OSA - not on CPAP  4th Rib Fracture - noted on CXR  P:   Continue psv. Not a candidate for extubation at this time. May need tracheostomy tube placement.  CARDIOVASCULAR A:  Hx PAF, HTN, HLD  P:  SBP goal less than 170 per  neurosurgery Metoprolol scheduled every 6 hours Labetalol and hydralazine if needed for blood pressure control No anticoagulation  RENAL A:   Hypokalemia - resolved Anion gap metabolic acidosis - resolved    GASTROINTESTINAL A:   At risk Malnutrition  SUP P:   Tube feeding Protonix for GI prophylaxis  HEMATOLOGIC A:   VTE prophylaxis P:  Follow CBC SCD in place  INFECTIOUS A:   No indication of infection P:   Monitor surgical site  ENDOCRINE A:   DM  P:    Sliding scale insulin per protocol at this point we have good glycemic control. Continue Lantus as ordered.   NEUROLOGIC A:   Left Craniotomy s/p Tumor Removal  Cerebral Edema with Midline Shift  Rosai Dorfman Syndrome  P:   Neurology follow up evaluation.   Salley Scarlet, MD [facility] Pager: [phone]  or [phone] 05/03/2017, 3:37 PM

## 2017-05-04 LAB — GLUCOSE, CAPILLARY
GLUCOSE-CAPILLARY: 164 mg/dL — AB (ref 65–99)
GLUCOSE-CAPILLARY: 134 mg/dL — AB (ref 65–99)
Glucose-Capillary: 111 mg/dL — ABNORMAL HIGH (ref 65–99)
Glucose-Capillary: 119 mg/dL — ABNORMAL HIGH (ref 65–99)
Glucose-Capillary: 119 mg/dL — ABNORMAL HIGH (ref 65–99)
Glucose-Capillary: 142 mg/dL — ABNORMAL HIGH (ref 65–99)
Glucose-Capillary: 86 mg/dL (ref 65–99)

## 2017-05-04 MED ORDER — RISPERIDONE 2 MG PO TABS
2.0000 mg | ORAL_TABLET | Freq: Every day | ORAL | Status: DC
  Administered 2017-05-04: 2 mg
  Filled 2017-05-04: qty 1

## 2017-05-04 NOTE — Progress Notes (Signed)
Foley catheter was pulled on day-shift even though MD order to keep in place.  Will monitor for need to have placed again

## 2017-05-04 NOTE — Progress Notes (Addendum)
PULMONARY / CRITICAL CARE MEDICINE   Name: Rachel [name] MRN: [id number] DOB: July 20, 1950    ADMISSION DATE:  04/19/2017 CONSULTATION DATE:  04/23/17  REFERRING MD:  Dr. Trenton Gammon   CHIEF COMPLAINT:  Respiratory Insufficiency   HISTORY OF PRESENT ILLNESS:  67 y/o F who presented 3/11 with a 2 day history of worsening right sided weakness.    At baseline, she is dependent for all ADL's. She required assistance to walk, has to be fed.  She has a history of Rosai Dorfman and had previously undergone an occipital craniotomy for removal of lesions in the past.  She recently had a stable appearance of meningeal lesions in the left middle cranial fossa.  She has a hx of schizophrenia and OSA but not on CPAP after at trial with no improvement.    Initial ER evaluation included a CT of the head which showed extensive left sided vasogenic edema involving the external capsule extending to the temporal lobe, parietal lobe with associated mass-effect with compression of the left parietal ventricle, 42mm left to right midline shift. She was treated with IV decadron and evaluated by NSGY. 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 Post procedure, she failed extubation and required re-intubation.  She was returned to ICU on mechanical ventilation.      SUBJECTIVE: This is day 15 status post craniotomy.  Patient weaned 5/5 all day yesterday, but has been unable to wean at all today.   VITAL SIGNS: BP 121/69   Pulse (!) 54   Temp 97.7 F (36.5 C) (Axillary)   Resp 14   Ht 5\' 6"  (1.676 m)   Wt 53.3 kg (117 lb 8.1 oz)   SpO2 100%   BMI 18.97 kg/m   HEMODYNAMICS:    VENTILATOR SETTINGS: Vent Mode: PRVC FiO2 (%):  [30 %] 30 % Set Rate:  [14 bmp] 14 bmp Vt Set:  [480 mL] 480 mL PEEP:  [5 cmH20] 5 cmH20 Pressure Support:  [5 cmH20] 5 cmH20 Plateau Pressure:  [12 cmH20-19 cmH20] 17 cmH20  INTAKE / OUTPUT: I/O last 3 completed  shifts: In: 2280 [NG/GT:1980; IV Piggyback:300] Out: 3250 [Urine:3250]  PHYSICAL EXAMINATION:  General: chronically ill appearing female on vent.   Neuro: Spontaneously awake, but not truly alert. Eyes do not track. She is unable to follow simple commands.  HEENT: There is an endotracheal tube in place.  The craniotomy site is clean and dry. No weepage from the bandage.  Cardiovascular: S1 and S2 are regular without murmur rub or gallop.    Lungs: Clear bilateral breath sounds Abdomen: Soft and nontender, bowel sounds are present.  Musculoskeletal: warm to touch.  Skin: No rash  LABS:  BMET Recent Labs  Lab 04/30/17 0352 05/02/17 0356 05/03/17 0314  NA 138 135 133*  K 4.3 4.7 3.7  CL 101 99* 97*  CO2 30 25 28   BUN 20 25* 24*  CREATININE 0.34* 0.38* 0.42*  GLUCOSE 134* 102* 172*    Electrolytes Recent Labs  Lab 04/29/17 0533 04/30/17 0352 05/02/17 0356 05/03/17 0314  CALCIUM 9.9 10.7* 10.0 9.9  MG 1.7  --   --   --   PHOS 2.6  --   --   --     CBC Recent Labs  Lab 04/29/17 0533 04/30/17 0621 05/02/17 0356  WBC 21.9* 24.9* 15.8*  HGB 10.9* 11.3* 10.7*  HCT 32.5* 33.0* 32.3*  PLT 196 221 189  Coag's No results for input(s): APTT, INR in the last 168 hours.  Sepsis Markers Recent Labs  Lab 04/30/17 1349 05/01/17 0718 05/02/17 0356  PROCALCITON <0.10 <0.10 <0.10    ABG Recent Labs  Lab 04/28/17 0330 04/30/17 0304 05/02/17 0355  PHART 7.480* 7.399 7.505*  PCO2ART 40.4 40.4 39.0  PO2ART 139* 69.7* 75.0*    Liver Enzymes Recent Labs  Lab 04/30/17 0352  AST 29  ALT 71*  ALKPHOS 63  BILITOT 0.6  ALBUMIN 2.1*    Cardiac Enzymes No results for input(s): TROPONINI, PROBNP in the last 168 hours.  Glucose Recent Labs  Lab 05/03/17 1230 05/03/17 1617 05/03/17 2021 05/03/17 2338 05/04/17 0344 05/04/17 0821  GLUCAP 116* 133* 201* 158* 119* 111*    Imaging No results found.   STUDIES:  CT Head 3/11 >> Extensive left-sided  vasogenic edema involving external capsule, left-sided centrum semi ovale, extending into the temporal lobe and parietal lobe. There is associated mass effect with compression of the left lateral ventricle and 3 mm of left-to-right midline shift at the foramen of Monro. Indistinctness of the left-sided sulci may be secondary to mass effect, however, new metastatic disease or manifestation of the patient's known histiocytosis may be the underlying etiology EEG 3/20 > focal slowing over left hemisphere and mild generalized background slowing, c/w focal cerebral disturbance on left with superimposed encephalopathy pattern.  CULTURES:   ANTIBIOTICS: Cefazolin 3/15 x 1  SIGNIFICANT EVENTS: 3/11 Admit with progressive weakness, edema on CT head  3/15 Crani for resection of tumor, failed extubation   LINES/TUBES: ETT 3/15 >>   DISCUSSION: 67 y/o F admitted 3/11 with increasing weakness.  Known hx of Rosai Dorfman with previously stable CT head.  On admit found to have new edema with midline shift.  To OR 3/15 for left craniotomy for resection of tumor.  Mental status remains poor postoperatively precluding extubation and she failed a spontaneous breathing trial on 3/23.  Respiratory pattern is stable on psv of 5cm. Altered mental status precludes extubation at this time.  ASSESSMENT / PLAN:  PULMONARY A: Acute Respiratory Insufficiency  OSA - not on CPAP  4th Rib Fracture - noted on CXR  Failure to wean P:   Continue psv. Not a candidate for extubation at this time. May need tracheostomy tube placement. Will repeat CXR in am to look for any pulmonary reason she is unable to wean. Do not expect to find much.  Suspect we will terminally extubate in a few days. Today is vent day 15. Family would like a few more days. They are not warm to the idea of a trach. Reasonably.   CARDIOVASCULAR A:  Hx PAF, HTN, HLD  P:  SBP goal less than 170 per neurosurgery Metoprolol scheduled every 6  hours Labetalol and hydralazine if needed for blood pressure control No anticoagulation  RENAL A:   Hypokalemia - resolved Anion gap metabolic acidosis - resolved 7L positive for admission, but has not had foley so output is not likely accurate.   P: Limit volume Cant see any benefit to diuresis as she is not volume up on exam or imaging.  BMP in am  GASTROINTESTINAL A:   At risk Malnutrition  SUP P:   Tube feeding Protonix for GI prophylaxis  HEMATOLOGIC A:   VTE prophylaxis P:  Follow CBC SCD in place  INFECTIOUS A:   No indication of infection P:   Monitor surgical site  ENDOCRINE A:   DM  P:  Sliding scale insulin per protocol at this point we have good glycemic control. Continue Lantus as ordered.   NEUROLOGIC A:   Left Craniotomy s/p Tumor Removal  Cerebral Edema with Midline Shift  Rosai Dorfman Syndrome  Deconditioning Depression  P:   Frequent neuro exams, relatively unchanged.  Neurosurgery following Neurology signed off 3/22 rec [place] discussion.  Will restart home anti-depressants. Husband feels these being discontinued is a real barrier to her improvement.  Continue Keppra, decadron,   Updates: Family (husband) updated at bedside. Says "being able to take her home and walking with her" would be the goal for them both. He understands that this is unlikely especially considering her pre-op baseline quality of life. I discussed long vent course at this point and he knows that he will need to decide on trach vs one way extubation within the next couple days. He feels as though they would not want to do a trach, but wants to give it a few more days (3-4).  Georgann Housekeeper, AGACNP-BC  Pulmonology/Critical Care Pager [phone] or [phone]  05/04/2017 12:01 PM  I have reviewed the above and concur with the findings. Patient not yet a candidate for liberation from the ventilator.  Salley Scarlet, MD [facility] Pager [phone] or [phone]

## 2017-05-04 NOTE — Progress Notes (Signed)
Overall not much change.  Patient will awaken to voice.  She appears somewhat aware.  She does not make any efforts to follow commands.  She moves all 4 extremities purposefully.  Her wound is healing well.  There is no evidence of infection.  Status post craniotomy for meningiomas x2.  Patient with continuing encephalopathy secondary to cerebral edema and possible underlying vascular disease coupled with known neuropsych issues.  I have no new recommendations at this point.  Continue current efforts.

## 2017-05-05 ENCOUNTER — Inpatient Hospital Stay (HOSPITAL_COMMUNITY): Payer: BLUE CROSS/BLUE SHIELD

## 2017-05-05 LAB — GLUCOSE, CAPILLARY
GLUCOSE-CAPILLARY: 117 mg/dL — AB (ref 65–99)
GLUCOSE-CAPILLARY: 180 mg/dL — AB (ref 65–99)
Glucose-Capillary: 152 mg/dL — ABNORMAL HIGH (ref 65–99)
Glucose-Capillary: 112 mg/dL — ABNORMAL HIGH (ref 65–99)
Glucose-Capillary: 92 mg/dL (ref 65–99)

## 2017-05-05 LAB — BASIC METABOLIC PANEL
Anion gap: 7 (ref 5–15)
BUN: 28 mg/dL — AB (ref 6–20)
CHLORIDE: 100 mmol/L — AB (ref 101–111)
CO2: 29 mmol/L (ref 22–32)
CREATININE: 0.35 mg/dL — AB (ref 0.44–1.00)
Calcium: 10.3 mg/dL (ref 8.9–10.3)
Glucose, Bld: 153 mg/dL — ABNORMAL HIGH (ref 65–99)
Potassium: 3.7 mmol/L (ref 3.5–5.1)
SODIUM: 136 mmol/L (ref 135–145)

## 2017-05-05 LAB — PHOSPHORUS: PHOSPHORUS: 2.5 mg/dL (ref 2.5–4.6)

## 2017-05-05 LAB — CBC
HCT: 31.8 % — ABNORMAL LOW (ref 36.0–46.0)
HEMOGLOBIN: 10.7 g/dL — AB (ref 12.0–15.0)
MCH: 30.1 pg (ref 26.0–34.0)
MCHC: 33.6 g/dL (ref 30.0–36.0)
MCV: 89.3 fL (ref 78.0–100.0)
PLATELETS: 217 10*3/uL (ref 150–400)
RBC: 3.56 MIL/uL — AB (ref 3.87–5.11)
RDW: 12.8 % (ref 11.5–15.5)
WBC: 14.6 10*3/uL — ABNORMAL HIGH (ref 4.0–10.5)

## 2017-05-05 LAB — MAGNESIUM: MAGNESIUM: 2.1 mg/dL (ref 1.7–2.4)

## 2017-05-05 MED ORDER — DEXAMETHASONE SODIUM PHOSPHATE 10 MG/ML IJ SOLN
2.0000 mg | Freq: Three times a day (TID) | INTRAMUSCULAR | Status: DC
  Administered 2017-05-05 – 2017-05-08 (×9): 2 mg via INTRAVENOUS
  Filled 2017-05-05 (×9): qty 1

## 2017-05-05 MED ORDER — RISPERIDONE 1 MG PO TABS
1.0000 mg | ORAL_TABLET | Freq: Every day | ORAL | Status: DC
  Administered 2017-05-05 – 2017-05-09 (×5): 1 mg
  Filled 2017-05-05 (×5): qty 1

## 2017-05-05 NOTE — Progress Notes (Signed)
Much more bright and aware today.  Interact some with family.  Still not following commands.  Overall I am encouraged by her progress.  I am hopeful that she will begin weaning from the ventilator better and hopefully we can move towards extubation soon.

## 2017-05-05 NOTE — Progress Notes (Signed)
PULMONARY / CRITICAL CARE MEDICINE   Name: Rachel [name] MRN: [id number] DOB: 02-11-1950    ADMISSION DATE:  04/19/2017 CONSULTATION DATE:  04/23/17  REFERRING MD:  Dr. Trenton Gammon   CHIEF COMPLAINT:  Respiratory Insufficiency   HISTORY OF PRESENT ILLNESS:  67 y/o F who presented 3/11 with a 2 day history of worsening right sided weakness.    At baseline, she is dependent for all ADL's. She required assistance to walk, has to be fed.  She has a history of Rosai Dorfman and had previously undergone an occipital craniotomy for removal of lesions in the past.  She recently had a stable appearance of meningeal lesions in the left middle cranial fossa.  She has a hx of schizophrenia and OSA but not on CPAP after at trial with no improvement.    Initial ER evaluation included a CT of the head which showed extensive left sided vasogenic edema involving the external capsule extending to the temporal lobe, parietal lobe with associated mass-effect with compression of the left parietal ventricle, 65mm left to right midline shift. She was treated with IV decadron and evaluated by NSGY. 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 Post procedure, she failed extubation and required re-intubation.  She was returned to ICU on mechanical ventilation.      SUBJECTIVE: Is day 16 status post craniotomy.  She is extremely lethargic and not at all interactive today.  I decreased her set ventilator rate and she did not breathe above the decreased rate of 6.  VITAL SIGNS: BP 113/68 (BP Location: Right Arm)   Pulse (!) 57   Temp 98.1 F (36.7 C) (Oral)   Resp 14   Ht 5\' 6"  (1.676 m)   Wt 114 lb 6.7 oz (51.9 kg)   SpO2 100%   BMI 18.47 kg/m   HEMODYNAMICS:    VENTILATOR SETTINGS: Vent Mode: PRVC FiO2 (%):  [30 %] 30 % Set Rate:  [14 bmp] 14 bmp Vt Set:  [480 mL] 480 mL PEEP:  [5 cmH20] 5 cmH20 Plateau Pressure:  [14 cmH20-22 cmH20] 14  cmH20  INTAKE / OUTPUT: I/O last 3 completed shifts: In: 2435 [NG/GT:2035; IV Piggyback:400] Out: 2200 [Urine:2200]  PHYSICAL EXAMINATION:  General: Chronically ill-appearing, mechanically ventilated, in no distress  Neuro: Very lethargic for me.  No response to voice.  No spontaneous eye opening.  Moves all fours to sternal rub.  Pupils equal with left eye deviation.  Marland Kitchen  HEENT: Craniotomy site is clean and dry.    Cardiovascular: S1 and S2 are regular without murmur rub or gallop.      Lungs: There is symmetric air movement, few scattered rhonchi, no wheezes.   Abdomen: Soft without any organomegaly masses or tenderness.    Musculoskeletal: warm to touch.  Skin: No rash  LABS:  BMET Recent Labs  Lab 05/02/17 0356 05/03/17 0314 05/05/17 0255  NA 135 133* 136  K 4.7 3.7 3.7  CL 99* 97* 100*  CO2 25 28 29   BUN 25* 24* 28*  CREATININE 0.38* 0.42* 0.35*  GLUCOSE 102* 172* 153*    Electrolytes Recent Labs  Lab 04/29/17 0533  05/02/17 0356 05/03/17 0314 05/05/17 0255  CALCIUM 9.9   < > 10.0 9.9 10.3  MG 1.7  --   --   --  2.1  PHOS 2.6  --   --   --  2.5   < > = values in  this interval not displayed.    CBC Recent Labs  Lab 04/30/17 0621 05/02/17 0356 05/05/17 0255  WBC 24.9* 15.8* 14.6*  HGB 11.3* 10.7* 10.7*  HCT 33.0* 32.3* 31.8*  PLT 221 189 217    Coag's No results for input(s): APTT, INR in the last 168 hours.  Sepsis Markers Recent Labs  Lab 04/30/17 1349 05/01/17 0718 05/02/17 0356  PROCALCITON <0.10 <0.10 <0.10    ABG Recent Labs  Lab 04/30/17 0304 05/02/17 0355  PHART 7.399 7.505*  PCO2ART 40.4 39.0  PO2ART 69.7* 75.0*    Liver Enzymes Recent Labs  Lab 04/30/17 0352  AST 29  ALT 71*  ALKPHOS 63  BILITOT 0.6  ALBUMIN 2.1*    Cardiac Enzymes No results for input(s): TROPONINI, PROBNP in the last 168 hours.  Glucose Recent Labs  Lab 05/04/17 1157 05/04/17 1557 05/04/17 2020 05/04/17 2129 05/04/17 2348  05/05/17 0409  GLUCAP 164* 134* 86 119* 142* 152*    Imaging No results found.   STUDIES:  CT Head 3/11 >> Extensive left-sided vasogenic edema involving external capsule, left-sided centrum semi ovale, extending into the temporal lobe and parietal lobe. There is associated mass effect with compression of the left lateral ventricle and 3 mm of left-to-right midline shift at the foramen of Monro. Indistinctness of the left-sided sulci may be secondary to mass effect, however, new metastatic disease or manifestation of the patient's known histiocytosis may be the underlying etiology EEG 3/20 > focal slowing over left hemisphere and mild generalized background slowing, c/w focal cerebral disturbance on left with superimposed encephalopathy pattern.  CULTURES:   ANTIBIOTICS: Cefazolin 3/15 x 1  SIGNIFICANT EVENTS: 3/11 Admit with progressive weakness, edema on CT head  3/15 Crani for resection of tumor, failed extubation   LINES/TUBES: ETT 3/15 >>   DISCUSSION: 67 y/o F admitted 3/11 with increasing weakness.  Known hx of Rosai Dorfman with previously stable CT head.  On admit found to have new edema with midline shift.  To OR 3/15 for left craniotomy for resection of tumor.  Mental status remains poor postoperatively precluding extubation and she has failed a spontaneous breathing trial repeatedly.      ASSESSMENT / PLAN:  PULMONARY A: Acute Respiratory Insufficiency  OSA - not on CPAP  4th Rib Fracture - noted on CXR  Failure to wean P:   Respiratory demands are not high which she fails to tolerate spontaneous breathing.  She is very lethargic and I have looked for multiple metabolic provocations including hypothyroidism and found none.  I will be decreasing her dose of Risperdal today to see if this improves her mental status.  I am awaiting a decision from husband as to the appropriateness of tracheostomy.  CARDIOVASCULAR A:  Hx PAF, HTN, HLD  P:  SBP goal less than 170  per neurosurgery Metoprolol scheduled every 6 hours Labetalol and hydralazine if needed for blood pressure control No anticoagulation  RENAL A:   Hypokalemia - resolved Anion gap metabolic acidosis - resolved 7L positive for admission, but has not had foley so output is not likely accurate.   P: Limit volume Cant see any benefit to diuresis as she is not volume up on exam or imaging.  BMP in am  GASTROINTESTINAL A:   At risk Malnutrition  SUP P:   Tube feeding Protonix for GI prophylaxis  HEMATOLOGIC A:   VTE prophylaxis P:  Follow CBC SCD in place  INFECTIOUS A:   No indication of infection P:  Monitor surgical site  ENDOCRINE A:   DM  P:    Sliding scale insulin per protocol at this point we have good glycemic control. Continue Lantus as ordered.   NEUROLOGIC A:   Left Craniotomy s/p Tumor Removal  Cerebral Edema with Midline Shift  Rosai Dorfman Syndrome  Deconditioning Depression  P:   Frequent neuro exams, relatively unchanged.  Neurosurgery following Neurology signed off 3/22 rec [place] discussion.  Continue Keppra, begin tapering Decadron further.    Updates: Family (husband) updated at bedside. Says "being able to take her home and walking with her" would be the goal for them both. He understands that this is unlikely especially considering her pre-op baseline quality of life. I discussed long vent course at this point and he knows that he will need to decide on trach vs one way extubation within the next couple days. He feels as though they would not want to do a trach, but wants to give it a few more days (3-4).  05/05/2017 8:16 AM   Lars Masson, MD [place] Pager [phone] or [phone]

## 2017-05-06 LAB — GLUCOSE, CAPILLARY
GLUCOSE-CAPILLARY: 116 mg/dL — AB (ref 65–99)
GLUCOSE-CAPILLARY: 119 mg/dL — AB (ref 65–99)
GLUCOSE-CAPILLARY: 129 mg/dL — AB (ref 65–99)
GLUCOSE-CAPILLARY: 137 mg/dL — AB (ref 65–99)
GLUCOSE-CAPILLARY: 90 mg/dL (ref 65–99)
GLUCOSE-CAPILLARY: 99 mg/dL (ref 65–99)
Glucose-Capillary: 96 mg/dL (ref 65–99)

## 2017-05-06 MED ORDER — INSULIN DETEMIR 100 UNIT/ML ~~LOC~~ SOLN
6.0000 [IU] | Freq: Two times a day (BID) | SUBCUTANEOUS | Status: DC
  Administered 2017-05-06 – 2017-05-07 (×3): 6 [IU] via SUBCUTANEOUS
  Filled 2017-05-06 (×4): qty 0.06

## 2017-05-06 MED ORDER — HEPARIN SODIUM (PORCINE) 5000 UNIT/ML IJ SOLN
5000.0000 [IU] | Freq: Two times a day (BID) | INTRAMUSCULAR | Status: DC
  Administered 2017-05-06 – 2017-05-11 (×12): 5000 [IU] via SUBCUTANEOUS
  Filled 2017-05-06 (×12): qty 1

## 2017-05-06 MED ORDER — FENTANYL CITRATE (PF) 100 MCG/2ML IJ SOLN
25.0000 ug | INTRAMUSCULAR | Status: DC | PRN
Start: 2017-05-06 — End: 2017-05-12
  Administered 2017-05-08 – 2017-05-11 (×2): 25 ug via INTRAVENOUS
  Filled 2017-05-06 (×2): qty 2

## 2017-05-06 NOTE — Progress Notes (Signed)
PULMONARY / CRITICAL CARE MEDICINE   Name: Rachel [name] MRN: [id number] DOB: 1950/07/28    ADMISSION DATE:  04/19/2017 CONSULTATION DATE:  04/23/17  REFERRING MD:  Dr. Trenton Gammon   CHIEF COMPLAINT:  Respiratory Insufficiency   HISTORY OF PRESENT ILLNESS:  66 y/o F who presented 3/11 with a 2 day history of worsening right sided weakness.    At baseline, she is dependent for all ADL's. She required assistance to walk, has to be fed.  She has a history of Rosai Dorfman and had previously undergone an occipital craniotomy for removal of lesions in the past.  She recently had a stable appearance of meningeal lesions in the left middle cranial fossa.  She has a hx of schizophrenia and OSA but not on CPAP after at trial with no improvement.    Initial ER evaluation included a CT of the head which showed extensive left sided vasogenic edema involving the external capsule extending to the temporal lobe, parietal lobe with associated mass-effect with compression of the left parietal ventricle, 18mm left to right midline shift. She was treated with IV decadron and evaluated by NSGY. 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 Post procedure, she failed extubation and required re-intubation.  She was returned to ICU on mechanical ventilation.      SUBJECTIVE: This is postoperative day 17.  She has spontaneous eye opening but does not consistently interactive.  She does not intermittently to questions for me.  She failed an attempted a spontaneous breathing trial this morning becoming apneic after she was switched to CPAP.    VITAL SIGNS: BP 126/69 (BP Location: Right Arm)   Pulse (!) 51   Temp 97.8 F (36.6 C) (Axillary)   Resp 14   Ht 5\' 6"  (1.676 m)   Wt 114 lb 6.7 oz (51.9 kg)   SpO2 100%   BMI 18.47 kg/m   HEMODYNAMICS:    VENTILATOR SETTINGS: Vent Mode: PRVC FiO2 (%):  [30 %] 30 % Set Rate:  [14 bmp] 14 bmp Vt Set:   [480 mL] 480 mL PEEP:  [5 cmH20] 5 cmH20 Plateau Pressure:  [11 cmH20-23 cmH20] 15 cmH20  INTAKE / OUTPUT: I/O last 3 completed shifts: In: 2365 [NG/GT:2065; IV Piggyback:300] Out: 1250 [Urine:1250]  PHYSICAL EXAMINATION:  General: Chronically ill-appearing and mechanically ventilated via oral endotracheal tube.   Neuro: Spontaneous eye opening and inconsistently nodding to questions.  She does move all fours but not on request. HEENT: Craniotomy site is clean and dry.    Cardiovascular: S1 and S2 are regular without murmur rub or gallop.      Lungs: Respirations are unlabored after converting back to assist control ventilation.  There is symmetric air movement, no wheezes.     Abdomen: Abdomen is flat and soft without any organomegaly masses or tenderness     Musculoskeletal: warm to touch.  Skin: No rash  LABS:  BMET Recent Labs  Lab 05/02/17 0356 05/03/17 0314 05/05/17 0255  NA 135 133* 136  K 4.7 3.7 3.7  CL 99* 97* 100*  CO2 25 28 29   BUN 25* 24* 28*  CREATININE 0.38* 0.42* 0.35*  GLUCOSE 102* 172* 153*    Electrolytes Recent Labs  Lab 05/02/17 0356 05/03/17 0314 05/05/17 0255  CALCIUM 10.0 9.9 10.3  MG  --   --  2.1  PHOS  --   --  2.5    CBC Recent Labs  Lab 04/30/17 0621 05/02/17 0356 05/05/17 0255  WBC 24.9* 15.8* 14.6*  HGB 11.3* 10.7* 10.7*  HCT 33.0* 32.3* 31.8*  PLT 221 189 217    Coag's No results for input(s): APTT, INR in the last 168 hours.  Sepsis Markers Recent Labs  Lab 04/30/17 1349 05/01/17 0718 05/02/17 0356  PROCALCITON <0.10 <0.10 <0.10    ABG Recent Labs  Lab 04/30/17 0304 05/02/17 0355  PHART 7.399 7.505*  PCO2ART 40.4 39.0  PO2ART 69.7* 75.0*    Liver Enzymes Recent Labs  Lab 04/30/17 0352  AST 29  ALT 71*  ALKPHOS 63  BILITOT 0.6  ALBUMIN 2.1*    Cardiac Enzymes No results for input(s): TROPONINI, PROBNP in the last 168 hours.  Glucose Recent Labs  Lab 05/05/17 1158 05/05/17 1605  05/05/17 2032 05/05/17 2347 05/06/17 0416 05/06/17 0843  GLUCAP 92 117* 180* 90 99 129*    Imaging No results found.   STUDIES:  CT Head 3/11 >> Extensive left-sided vasogenic edema involving external capsule, left-sided centrum semi ovale, extending into the temporal lobe and parietal lobe. There is associated mass effect with compression of the left lateral ventricle and 3 mm of left-to-right midline shift at the foramen of Monro. Indistinctness of the left-sided sulci may be secondary to mass effect, however, new metastatic disease or manifestation of the patient's known histiocytosis may be the underlying etiology EEG 3/20 > focal slowing over left hemisphere and mild generalized background slowing, c/w focal cerebral disturbance on left with superimposed encephalopathy pattern.  CULTURES:   ANTIBIOTICS: Cefazolin 3/15 x 1  SIGNIFICANT EVENTS: 3/11 Admit with progressive weakness, edema on CT head  3/15 Crani for resection of tumor, failed extubation   LINES/TUBES: ETT 3/15 >>   DISCUSSION: 67 y/o F admitted 3/11 with increasing weakness.  Known hx of Rosai Dorfman with previously stable CT head.  On admit found to have new edema with midline shift.  To OR 3/15 for left craniotomy for resection of tumor.  Mental status remains poor postoperatively precluding extubation and she has failed a spontaneous breathing trial repeatedly.      ASSESSMENT / PLAN:  PULMONARY A: Acute Respiratory Insufficiency  OSA - not on CPAP  4th Rib Fracture - noted on CXR  Failure to wean P:   She again failed a spontaneous breathing trial this morning becoming apneic shortly after the event was switched over.  I have ordered a blood gas for the morning to determine whether she is over ventilated which would account for her transient apnea.    CARDIOVASCULAR A:  Hx PAF, HTN, HLD  P:  SBP goal less than 170 per neurosurgery Metoprolol scheduled every 6 hours Labetalol and hydralazine if  needed for blood pressure control No anticoagulation  RENAL A:   Hypokalemia - resolved Anion gap metabolic acidosis - resolved 7L positive for admission, but has not had foley so output is not likely accurate.   P: Limit volume Cant see any benefit to diuresis as she is not volume up on exam or imaging.  BMP in am  GASTROINTESTINAL A:   At risk Malnutrition  SUP P:   Tube feeding Protonix for GI prophylaxis  HEMATOLOGIC A:   VTE prophylaxis P:  Follow CBC SCD in place  INFECTIOUS A:   No indication of infection P:   Monitor surgical site  ENDOCRINE A:   DM  P:    Sliding scale insulin per protocol at this point we have good glycemic control, in fact  it may be a little tight as we decrease her dose of Decadron therefore I have decreased her Levemir.   NEUROLOGIC A:   Left Craniotomy s/p Tumor Removal  Cerebral Edema with Midline Shift  Rosai Dorfman Syndrome  Deconditioning Depression   Neurosurgery following Neurology signed off 3/22 rec [place] discussion.  Continue Keppra, begin tapering Decadron further.    Spoke with her husband on 3/27 who tells me that with the past surgeries she has been very slow to emerge and that she really did not turn around from a mental status standpoint until her antipsychotics were reintroduced.  Especially in light of today's improvement he would like [place] to push forward for several more days before making any decision about tracheostomy or level of care.  05/06/2017 9:48 AM   Lars Masson, MD [place] Pager [phone] or [phone]

## 2017-05-06 NOTE — Progress Notes (Signed)
Nutrition Follow-up  DOCUMENTATION CODES:   Severe malnutrition in context of chronic illness  INTERVENTION:   Continue via Cortrak:  Vital High Protein @ 55 ml/hr (1320 ml/day) MVI daily  Provides: 1320 kcal, 116 grams protein, and 1109 ml free water.    NUTRITION DIAGNOSIS:   Severe Malnutrition related to chronic illness(brain tumor) as evidenced by severe fat depletion, severe muscle depletion, percent weight loss. Ongoing.   GOAL:   Patient will meet greater than or equal to 90% of their needs Met.   MONITOR:   Weight trends, TF tolerance, Labs, I & O's  ASSESSMENT:   67 y/o F who presented 3/11 with a 2 day history of worsening right sided weakness. 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 Post procedure, she failed extubation and required re-intubation. PMH of blindness, DM type 1, GERD, hyperlipidemia, schizophrenia, SAH, HTN, and Rosai Dorfman. At baseline pt dependent for all ADL's.  3/15 intubated  Some improvement noted per MD  MV: 9.4 L/min Temp (24hrs), Avg:98.3 F (36.8 C), Min:97.8 F (36.6 C), Max:98.9 F (37.2 C)   Medications reviewed and include: decadron, 2 units novolog every 4 hrs, 3-9 units every 4 hr, levemir, MVI  Labs reviewed: CBG's: 123-161     Diet Order:  Fall precautions Seizure precautions Aspiration precautions Diet NPO time specified  EDUCATION NEEDS:   Not appropriate for education at this time  Skin:  Skin Assessment: Skin Integrity Issues: Skin Integrity Issues:: Stage II, Incisions Stage II: sacrum Incisions: head  Last BM:  3/25 small  Height:   Ht Readings from Last 1 Encounters:  05/04/17 5' 6"  (1.676 m)    Weight:   Wt Readings from Last 1 Encounters:  05/06/17 114 lb 6.7 oz (51.9 kg)    Ideal Body Weight:  59.1 kg  BMI:  Body mass index is 18.47 kg/m.  Estimated Nutritional Needs:   Kcal:  1313  Protein:   100-115g  Fluid:  >1.3L  Maylon Peppers RD, [place], [place] Pager [phone] After Hours Pager

## 2017-05-06 NOTE — Progress Notes (Signed)
No significant change over night.  Patient awakens to voice.  Appears aware.  No attempted verbalization.  Purposeful movement but not following commands.  Strength symmetric.  Wound healing well.  Continue efforts at ventilatory weaning.  I am encouraged by her improved level of consciousness.

## 2017-05-07 LAB — CBC WITH DIFFERENTIAL/PLATELET
BASOS ABS: 0 10*3/uL (ref 0.0–0.1)
Basophils Relative: 0 %
EOS ABS: 0 10*3/uL (ref 0.0–0.7)
EOS PCT: 0 %
HCT: 29.8 % — ABNORMAL LOW (ref 36.0–46.0)
Hemoglobin: 9.7 g/dL — ABNORMAL LOW (ref 12.0–15.0)
LYMPHS PCT: 9 %
Lymphs Abs: 1 10*3/uL (ref 0.7–4.0)
MCH: 29.9 pg (ref 26.0–34.0)
MCHC: 32.6 g/dL (ref 30.0–36.0)
MCV: 92 fL (ref 78.0–100.0)
Monocytes Absolute: 0.9 10*3/uL (ref 0.1–1.0)
Monocytes Relative: 8 %
NEUTROS PCT: 83 %
Neutro Abs: 9.3 10*3/uL — ABNORMAL HIGH (ref 1.7–7.7)
PLATELETS: 148 10*3/uL — AB (ref 150–400)
RBC: 3.24 MIL/uL — AB (ref 3.87–5.11)
RDW: 13.5 % (ref 11.5–15.5)
WBC: 11.2 10*3/uL — ABNORMAL HIGH (ref 4.0–10.5)

## 2017-05-07 LAB — BASIC METABOLIC PANEL
Anion gap: 5 (ref 5–15)
BUN: 27 mg/dL — AB (ref 6–20)
CHLORIDE: 103 mmol/L (ref 101–111)
CO2: 29 mmol/L (ref 22–32)
CREATININE: 0.36 mg/dL — AB (ref 0.44–1.00)
Calcium: 10.2 mg/dL (ref 8.9–10.3)
GFR calc Af Amer: >60 mL/min (ref >60)
GFR calc non Af Amer: >60 mL/min (ref >60)
Glucose, Bld: 154 mg/dL — ABNORMAL HIGH (ref 65–99)
Potassium: 3.6 mmol/L (ref 3.5–5.1)
Sodium: 137 mmol/L (ref 135–145)

## 2017-05-07 LAB — BLOOD GAS, ARTERIAL
ACID-BASE EXCESS: 7.7 mmol/L — AB (ref 0.0–2.0)
Bicarbonate: 31.5 mmol/L — ABNORMAL HIGH (ref 20.0–28.0)
Drawn by: 364961
FIO2: 30
O2 SAT: 98.9 %
PEEP/CPAP: 5 cmH2O
PH ART: 7.486 — AB (ref 7.350–7.450)
Patient temperature: 98.1
RATE: 14 resp/min
VT: 480 mL
pCO2 arterial: 42.1 mmHg (ref 32.0–48.0)
pO2, Arterial: 141 mmHg — ABNORMAL HIGH (ref 83.0–108.0)

## 2017-05-07 LAB — GLUCOSE, CAPILLARY
GLUCOSE-CAPILLARY: 140 mg/dL — AB (ref 65–99)
GLUCOSE-CAPILLARY: 166 mg/dL — AB (ref 65–99)
GLUCOSE-CAPILLARY: 86 mg/dL (ref 65–99)
Glucose-Capillary: 119 mg/dL — ABNORMAL HIGH (ref 65–99)
Glucose-Capillary: 141 mg/dL — ABNORMAL HIGH (ref 65–99)
Glucose-Capillary: 120 mg/dL — ABNORMAL HIGH (ref 65–99)

## 2017-05-07 LAB — MAGNESIUM: Magnesium: 2 mg/dL (ref 1.7–2.4)

## 2017-05-07 NOTE — Progress Notes (Addendum)
PULMONARY / CRITICAL CARE MEDICINE   Name: Rachel [name] MRN: [id number] DOB: 25-Apr-1950    ADMISSION DATE:  04/19/2017 CONSULTATION DATE:  04/23/17  REFERRING MD:  Dr. Trenton Gammon   CHIEF COMPLAINT:  Respiratory Insufficiency   HISTORY OF PRESENT ILLNESS:  67 y/o F who presented 3/11 with a 2 day history of worsening right sided weakness.    At baseline, she is dependent for all ADL's. She required assistance to walk, has to be fed.  She has a history of Rosai Dorfman and had previously undergone an occipital craniotomy for removal of lesions in the past.  She recently had a stable appearance of meningeal lesions in the left middle cranial fossa.  She has a hx of schizophrenia and OSA but not on CPAP after at trial with no improvement.    Initial ER evaluation included a CT of the head which showed extensive left sided vasogenic edema involving the external capsule extending to the temporal lobe, parietal lobe with associated mass-effect with compression of the left parietal ventricle, 3 mm left to right midline shift. She was treated with IV decadron and evaluated by NSGY. 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 Post procedure, she failed extubation and required re-intubation.  She was returned to ICU on mechanical ventilation.      SUBJECTIVE: This is postoperative day 18.  She opens her eyes, but is not interactive.  She remains on full vent support this am. SBT has not yet been attempted  + 7 L since admission T Max 98.2  VITAL SIGNS: BP 139/63   Pulse (!) 50   Temp 98.2 F (36.8 C) (Axillary)   Resp 10   Ht 5\' 6"  (1.676 m)   Wt 121 lb 14.6 oz (55.3 kg)   SpO2 100%   BMI 19.68 kg/m   HEMODYNAMICS:    VENTILATOR SETTINGS: Vent Mode: PRVC FiO2 (%):  [30 %] 30 % Set Rate:  [14 bmp] 14 bmp Vt Set:  [480 mL] 480 mL PEEP:  [5 cmH20] 5 cmH20 Plateau Pressure:  [15 cmH20-16 cmH20] 16 cmH20  INTAKE /  OUTPUT: I/O last 3 completed shifts: In: 2455 [NG/GT:2155; IV Piggyback:300] Out: 1300 [Urine:1300]  PHYSICAL EXAMINATION:  General: Chronically ill-appearing , on vent  via oral endotracheal tube.   Neuro: Spontaneous eye opening , asleep this am.  MAE x 4 but spontaneously, not on command HEENT: Craniotomy site is clean and dry.  , Feeding tube on place and secure  Cardiovascular: S1 and S2 , RRR, No RMG , bradycardia     Lungs: Respirations are unlabored on full vent support. Bilateral excursion noted, rhonchi through Abdomen: Abdomen is flat and soft , BS +, ND, NT   Musculoskeletal: warm to touch. NO obvious deformities  Skin: No rash, warm dry and intact  LABS:  BMET Recent Labs  Lab 05/03/17 0314 05/05/17 0255 05/07/17 0344  NA 133* 136 137  K 3.7 3.7 3.6  CL 97* 100* 103  CO2 28 29 29   BUN 24* 28* 27*  CREATININE 0.42* 0.35* 0.36*  GLUCOSE 172* 153* 154*    Electrolytes Recent Labs  Lab 05/03/17 0314 05/05/17 0255 05/07/17 0344  CALCIUM 9.9 10.3 10.2  MG  --  2.1 2.0  PHOS  --  2.5  --     CBC Recent Labs  Lab 05/02/17 0356 05/05/17 0255 05/07/17 0344  WBC 15.8* 14.6* 11.2*  HGB 10.7* 10.7*  9.7*  HCT 32.3* 31.8* 29.8*  PLT 189 217 148*    Coag's No results for input(s): APTT, INR in the last 168 hours.  Sepsis Markers Recent Labs  Lab 04/30/17 1349 05/01/17 0718 05/02/17 0356  PROCALCITON <0.10 <0.10 <0.10    ABG Recent Labs  Lab 05/02/17 0355 05/07/17 0335  PHART 7.505* 7.486*  PCO2ART 39.0 42.1  PO2ART 75.0* 141*    Liver Enzymes No results for input(s): AST, ALT, ALKPHOS, BILITOT, ALBUMIN in the last 168 hours.  Cardiac Enzymes No results for input(s): TROPONINI, PROBNP in the last 168 hours.  Glucose Recent Labs  Lab 05/06/17 1146 05/06/17 1625 05/06/17 1926 05/06/17 2353 05/07/17 0337 05/07/17 0745  GLUCAP 116* 96 137* 119* 140* 119*    Imaging No results found.   STUDIES:  CT Head 3/11 >> Extensive  left-sided vasogenic edema involving external capsule, left-sided centrum semi ovale, extending into the temporal lobe and parietal lobe. There is associated mass effect with compression of the left lateral ventricle and 3 mm of left-to-right midline shift at the foramen of Monro. Indistinctness of the left-sided sulci may be secondary to mass effect, however, new metastatic disease or manifestation of the patient's known histiocytosis may be the underlying etiology EEG 3/20 > focal slowing over left hemisphere and mild generalized background slowing, c/w focal cerebral disturbance on left with superimposed encephalopathy pattern.  CULTURES:   ANTIBIOTICS: Cefazolin 3/15 x 1  SIGNIFICANT EVENTS: 3/11 Admit with progressive weakness, edema on CT head  3/15 Crani for resection of tumor, failed extubation   LINES/TUBES: ETT 3/15 >>   DISCUSSION: 67 y/o F admitted 3/11 with increasing weakness.  Known hx of Rosai Dorfman with previously stable CT head.  On admit found to have new edema with midline shift.  To OR 3/15 for left craniotomy for resection of tumor.  Mental status remains poor postoperatively precluding extubation and she has failed a spontaneous breathing trial repeatedly.      ASSESSMENT / PLAN:  PULMONARY A: Acute Respiratory Insufficiency  OSA - not on CPAP  4th Rib Fracture - noted on CXR  Failure to wean>> apnea 3/28 CO2 on ABG 42 No SBT 3/29 am Multiple failed extubation attempts P:   SBT daily Will need to consider either trach or one way extubation if patient does not improve after antipsychotics are  reintroduced. Titrate oxygen for sats> 94% ABG prn CXR prn    CARDIOVASCULAR A:  Hx PAF, HTN, HLD  BP well controlled on current regimen Bradycardia P:  SBP goal less than 170 per neurosurgery Metoprolol scheduled every 6 hours>> consider decrease with bradycardia Labetalol and hydralazine if needed for blood pressure control No  anticoagulation  RENAL A:   Hypokalemia - resolved Anion gap metabolic acidosis - resolved 7L positive for admission , ? Accuracy  P: Minimize volume Trend BMET daily Monitor UO Replete electrolytes as needed   GASTROINTESTINAL A:   At risk Malnutrition  SUP Body mass index is 19.68 kg/m. P:   Tube feeding Protonix for GI prophylaxis  HEMATOLOGIC A:   VTE prophylaxis Leukocytosis>> downtrending P:  Trend CBC SCD in place Transfuse for HGB of 7  INFECTIOUS A:   No indication of infection P:   Monitor surgical site CXR prn   ENDOCRINE A:   DM  P:    SSI per protocol Will decrease Levamir as decadron is decreased CBG Q 4  NEUROLOGIC A:   Left Craniotomy s/p Tumor Removal  Cerebral Edema with Midline Shift  [place] Depression   Neurosurgery following Neurology signed off 3/22 rec [place] discussion.  Continue Keppra, begin tapering Decadron further.    Dr. Pearline Cables spoke with her husband on 3/27 , he stated  that with the past surgeries she has been very slow to emerge and that she really did not turn around from a mental status standpoint until her antipsychotics were reintroduced.  Especially in light of the fact she is now opening her eyes  he would like [place] to push forward for several more days before making any decision about tracheostomy or level of care.  05/07/2017 9:28 AM   Magdalen Spatz, AGACNP-BC [place] Pager  [phone]

## 2017-05-07 NOTE — Progress Notes (Signed)
Overall stable.  No new issues or problems.  Not making any progress on ventilatory weaning.  Family not recommend any ready for one-way extubation or tracheostomy.  Given her increased level of consciousness we will plan on giving her a few more days prior to making decision about extubation.

## 2017-05-08 ENCOUNTER — Inpatient Hospital Stay (HOSPITAL_COMMUNITY): Payer: BLUE CROSS/BLUE SHIELD

## 2017-05-08 LAB — BASIC METABOLIC PANEL
Anion gap: 4 — ABNORMAL LOW (ref 5–15)
BUN: 24 mg/dL — AB (ref 6–20)
CHLORIDE: 107 mmol/L (ref 101–111)
CO2: 29 mmol/L (ref 22–32)
Calcium: 10.4 mg/dL — ABNORMAL HIGH (ref 8.9–10.3)
Creatinine, Ser: 0.34 mg/dL — ABNORMAL LOW (ref 0.44–1.00)
GFR calc Af Amer: >60 mL/min (ref >60)
GFR calc non Af Amer: >60 mL/min (ref >60)
GLUCOSE: 101 mg/dL — AB (ref 65–99)
POTASSIUM: 3.7 mmol/L (ref 3.5–5.1)
Sodium: 140 mmol/L (ref 135–145)

## 2017-05-08 LAB — POCT I-STAT 3, ART BLOOD GAS (G3+)
Acid-Base Excess: 7 mmol/L — ABNORMAL HIGH (ref 0.0–2.0)
Bicarbonate: 31.3 mmol/L — ABNORMAL HIGH (ref 20.0–28.0)
O2 Saturation: 97 %
PCO2 ART: 41.2 mmHg (ref 32.0–48.0)
PH ART: 7.488 — AB (ref 7.350–7.450)
PO2 ART: 86 mmHg (ref 83.0–108.0)
Patient temperature: 97.9
TCO2: 33 mmol/L — ABNORMAL HIGH (ref 22–32)

## 2017-05-08 LAB — CBC
HEMATOCRIT: 29.5 % — AB (ref 36.0–46.0)
HEMOGLOBIN: 9.6 g/dL — AB (ref 12.0–15.0)
MCH: 30.3 pg (ref 26.0–34.0)
MCHC: 32.5 g/dL (ref 30.0–36.0)
MCV: 93.1 fL (ref 78.0–100.0)
Platelets: 142 10*3/uL — ABNORMAL LOW (ref 150–400)
RBC: 3.17 MIL/uL — AB (ref 3.87–5.11)
RDW: 13.6 % (ref 11.5–15.5)
WBC: 10.1 10*3/uL (ref 4.0–10.5)

## 2017-05-08 LAB — GLUCOSE, CAPILLARY
GLUCOSE-CAPILLARY: 99 mg/dL (ref 65–99)
GLUCOSE-CAPILLARY: 129 mg/dL — AB (ref 65–99)
Glucose-Capillary: 142 mg/dL — ABNORMAL HIGH (ref 65–99)
Glucose-Capillary: 182 mg/dL — ABNORMAL HIGH (ref 65–99)
Glucose-Capillary: 226 mg/dL — ABNORMAL HIGH (ref 65–99)

## 2017-05-08 LAB — MAGNESIUM: Magnesium: 1.9 mg/dL (ref 1.7–2.4)

## 2017-05-08 MED ORDER — INSULIN ASPART 100 UNIT/ML ~~LOC~~ SOLN
1.0000 [IU] | SUBCUTANEOUS | Status: DC
  Administered 2017-05-08: 3 [IU] via SUBCUTANEOUS
  Administered 2017-05-09: 1 [IU] via SUBCUTANEOUS
  Administered 2017-05-09 (×2): 2 [IU] via SUBCUTANEOUS
  Administered 2017-05-09: 1 [IU] via SUBCUTANEOUS
  Administered 2017-05-09: 3 [IU] via SUBCUTANEOUS
  Administered 2017-05-10 (×3): 1 [IU] via SUBCUTANEOUS
  Administered 2017-05-10 – 2017-05-11 (×5): 2 [IU] via SUBCUTANEOUS
  Administered 2017-05-11: 1 [IU] via SUBCUTANEOUS
  Administered 2017-05-11 (×2): 2 [IU] via SUBCUTANEOUS

## 2017-05-08 MED ORDER — SODIUM CHLORIDE 0.9 % IV SOLN
750.0000 mg | Freq: Two times a day (BID) | INTRAVENOUS | Status: DC
  Administered 2017-05-08 – 2017-05-09 (×3): 750 mg via INTRAVENOUS
  Filled 2017-05-08 (×4): qty 7.5

## 2017-05-08 NOTE — Progress Notes (Signed)
PULMONARY / CRITICAL CARE MEDICINE   Name: Rachel [name] MRN: [id number] DOB: [birth year]    ADMISSION DATE:  04/19/2017 CONSULTATION DATE:  04/23/17  REFERRING MD:  Dr. Trenton Gammon   CHIEF COMPLAINT:  Respiratory Insufficiency   HISTORY OF PRESENT ILLNESS:  67 y/o F who presented 3/11 with a 2 day history of worsening right sided weakness.    At baseline, she is dependent for all ADL's. She required assistance to walk, has to be fed.  She has a history of Rosai Dorfman and had previously undergone an occipital craniotomy for removal of lesions in the past.  She recently had a stable appearance of meningeal lesions in the left middle cranial fossa.  She has a hx of schizophrenia and OSA but not on CPAP after at trial with no improvement.    Initial ER evaluation included a CT of the head which showed extensive left sided vasogenic edema involving the external capsule extending to the temporal lobe, parietal lobe with associated mass-effect with compression of the left parietal ventricle, 3 mm left to right midline shift. She was treated with IV decadron and evaluated by NSGY. 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 Post procedure, she failed extubation and required re-intubation.  She was returned to ICU on mechanical ventilation.      SUBJECTIVE: This is postoperative day 20 he remains very lethargic.  Is not at all interactive with me.  She is on CPAP at the time of my examination and is been bradycardic overnight.   T Max 98.2  VITAL SIGNS: BP 122/71   Pulse (!) 44   Temp 97.9 F (36.6 C) (Axillary)   Resp 15   Ht 5\' 6"  (1.676 m)   Wt 119 lb 4.3 oz (54.1 kg)   SpO2 100%   BMI 19.25 kg/m   HEMODYNAMICS:    VENTILATOR SETTINGS: Vent Mode: PSV;CPAP FiO2 (%):  [30 %] 30 % Set Rate:  [14 bmp] 14 bmp Vt Set:  [480 mL] 480 mL PEEP:  [5 cmH20] 5 cmH20 Pressure Support:  [8 cmH20] 8 cmH20 Plateau Pressure:   [15 cmH20-17 cmH20] 17 cmH20  INTAKE / OUTPUT: I/O last 3 completed shifts: In: 2315 [NG/GT:2015; IV Piggyback:300] Out: 1350 [Urine:1350]  PHYSICAL EXAMINATION:  General: He is chronically ill-appearing orally intubated and mechanically ventilated and not responsive to voice.     Neuro: She is intermittently showing some spontaneous eye opening but is not at all interactive.  She is not tracking and affect shows a left gaze preference.  Pupils are equal.  She does move the upper extremities in response to noxious stimuli.  HEENT: Craniotomy site is clean and dry.  , Feeding tube on place and secure  Cardiovascular: S1 and S2 are regular without murmur rub or gallop      Lungs: Respirations are unlabored on a pressure support of 8.  There may be some slight decrease in air movement on the left.  There are no wheezes  Abdomen: The abdomen is flat soft and nontender.     Musculoskeletal: warm to touch. NO obvious deformities  Skin: No rash, warm dry and intact  LABS:  BMET Recent Labs  Lab 05/05/17 0255 05/07/17 0344 05/08/17 0809  NA 136 137 140  K 3.7 3.6 3.7  CL 100* 103 107  CO2 29 29 29   BUN 28* 27* 24*  CREATININE 0.35* 0.36* 0.34*  GLUCOSE 153* 154* 101*  Electrolytes Recent Labs  Lab 05/05/17 0255 05/07/17 0344 05/08/17 0809  CALCIUM 10.3 10.2 10.4*  MG 2.1 2.0 1.9  PHOS 2.5  --   --     CBC Recent Labs  Lab 05/05/17 0255 05/07/17 0344 05/08/17 0809  WBC 14.6* 11.2* 10.1  HGB 10.7* 9.7* 9.6*  HCT 31.8* 29.8* 29.5*  PLT 217 148* 142*    Coag's No results for input(s): APTT, INR in the last 168 hours.  Sepsis Markers Recent Labs  Lab 05/02/17 0356  PROCALCITON <0.10    ABG Recent Labs  Lab 05/02/17 0355 05/07/17 0335  PHART 7.505* 7.486*  PCO2ART 39.0 42.1  PO2ART 75.0* 141*    Liver Enzymes No results for input(s): AST, ALT, ALKPHOS, BILITOT, ALBUMIN in the last 168 hours.  Cardiac Enzymes No results for input(s): TROPONINI,  PROBNP in the last 168 hours.  Glucose Recent Labs  Lab 05/07/17 1125 05/07/17 1527 05/07/17 2047 05/07/17 2327 05/08/17 0510 05/08/17 0720  GLUCAP 141* 166* 86 120* 142* 99    Imaging Dg Chest Port 1 View  Result Date: 05/08/2017 CLINICAL DATA:  Respiratory failure EXAM: PORTABLE CHEST 1 VIEW COMPARISON:  Three days ago FINDINGS: New retrocardiac opacity with volume loss. Normal heart size and mediastinal contours. Endotracheal tube tip is between the clavicular heads and carina. Feeding tube at least reaches the stomach. The left lung is clear. Artifact from EKG leads. IMPRESSION: Interval left lower lobe collapse. Electronically Signed   By: Monte Fantasia M.D.   On: 05/08/2017 08:14     STUDIES:  CT Head 3/11 >> Extensive left-sided vasogenic edema involving external capsule, left-sided centrum semi ovale, extending into the temporal lobe and parietal lobe. There is associated mass effect with compression of the left lateral ventricle and 3 mm of left-to-right midline shift at the foramen of Monro. Indistinctness of the left-sided sulci may be secondary to mass effect, however, new metastatic disease or manifestation of the patient's known histiocytosis may be the underlying etiology EEG 3/20 > focal slowing over left hemisphere and mild generalized background slowing, c/w focal cerebral disturbance on left with superimposed encephalopathy pattern.  CULTURES:   ANTIBIOTICS: Cefazolin 3/15 x 1  SIGNIFICANT EVENTS: 3/11 Admit with progressive weakness, edema on CT head  3/15 Crani for resection of tumor, failed extubation   LINES/TUBES: ETT 3/15 >>   DISCUSSION: 67 y/o F admitted 3/11 with increasing weakness.  Known hx of Rosai Dorfman with previously stable CT head.  On admit found to have new edema with midline shift.  To OR 3/15 for left craniotomy for resection of tumor.  Mental status remains poor postoperatively precluding extubation and she has failed a spontaneous  breathing trial repeatedly.      ASSESSMENT / PLAN:  PULMONARY A: Acute Respiratory Insufficiency  OSA - not on CPAP  4th Rib Fracture - noted on CXR  Failure to wean>> apnea 3/28 CO2 on ABG 42 No SBT 3/29 am Multiple failed extubation attempts P:   SBT daily Will need to consider either trach or one way extubation if patient does not improve after antipsychotics are  reintroduced. Titrate oxygen for sats> 94% ABG prn CXR prn    CARDIOVASCULAR A:  Hx PAF, HTN, HLD  BP well controlled on current regimen Bradycardia P:  She continues to be bradycardic despite stopping all of her data blockers.  TSH this admission was normal.  Her mental status is not sufficiently altered that I suspect an intracranial catastrophe.  I am awaiting a  blood gas to determine whether she is hypercarbic.  QRS is not broadened to suggest hyperkalemia, I have submitted a BMP  RENAL A:   Hypokalemia - resolved Anion gap metabolic acidosis - resolved 7L positive for admission , ? Accuracy  P: Minimize volume Trend BMET daily Monitor UO Replete electrolytes as needed   GASTROINTESTINAL A:   At risk Malnutrition  SUP Body mass index is 19.25 kg/m. P:   Tube feeding Protonix for GI prophylaxis  HEMATOLOGIC A:   VTE prophylaxis Leukocytosis>> downtrending P:  Trend CBC SCD in place Transfuse for HGB of 7  INFECTIOUS A:   No indication of infection P:   Monitor surgical site CXR prn   ENDOCRINE A:   DM  P:    I have discontinued her Decadron today and simultaneously discontinued her Levemir.  CBG Q 4  NEUROLOGIC A:   Left Craniotomy s/p Tumor Removal  Cerebral Edema with Midline Shift  Rosai Dorfman Syndrome  Deconditioning Depression She is still very lethargic.  Her Keppra was in place for electrically not therapeutically and I am beginning to decrease that agent as it is the only potentially sit sedating agent we have that can be eliminated.   Neurosurgery  following Neurology signed off 3/22 rec [place] discussion.  Continue Keppra, begin tapering Decadron further.    Dr. Pearline Cables spoke with her husband on 3/27 , he stated  that with the past surgeries she has been very slow to emerge and that she really did not turn around from a mental status standpoint until her antipsychotics were reintroduced.  Especially in light of the fact she is now opening her eyes  he would like [place] to push forward for several more days before making any decision about tracheostomy or level of care.  05/08/2017 9:50 AM   Magdalen Spatz, AGACNP-BC [place] Pager  [phone]

## 2017-05-08 NOTE — Progress Notes (Signed)
RT note: ETT holder changed with assistance of another RT.  Patient tolerated well.  Also noted that ETT was at 19 at the lip, advanced back to 21.

## 2017-05-08 NOTE — Progress Notes (Signed)
Subjective: The patient is alert.  She is in no apparent distress.  Objective: Vital signs in last 24 hours: Temp:  [98 F (36.7 C)-99.1 F (37.3 C)] 99 F (37.2 C) (03/30 0359) Pulse Rate:  [42-73] 44 (03/30 0700) Resp:  [10-20] 14 (03/30 0700) BP: (107-139)/(55-71) 115/64 (03/30 0700) SpO2:  [98 %-100 %] 100 % (03/30 0700) FiO2 (%):  [30 %] 30 % (03/30 0330) Weight:  [54.1 kg (119 lb 4.3 oz)] 54.1 kg (119 lb 4.3 oz) (03/30 0500) Estimated body mass index is 19.25 kg/m as calculated from the following:   Height as of this encounter: 5\' 6"  (1.676 m).   Weight as of this encounter: 54.1 kg (119 lb 4.3 oz).   Intake/Output from previous day: 03/29 0701 - 03/30 0700 In: 1465 [NG/GT:1265; IV Piggyback:200] Out: 1050 [Urine:1050] Intake/Output this shift: No intake/output data recorded.  Physical exam Glascow coma scale 10 intubated, [id number].  She localized the pain bilaterally.  He does not follow commands.  Lab Results: Recent Labs    05/07/17 0344  WBC 11.2*  HGB 9.7*  HCT 29.8*  PLT 148*   BMET Recent Labs    05/07/17 0344  NA 137  K 3.6  CL 103  CO2 29  GLUCOSE 154*  BUN 27*  CREATININE 0.36*  CALCIUM 10.2    Studies/Results: No results found.  Assessment/Plan: Postop day #15: The patient is clinically stable.  LOS: 19 days     Ophelia Charter 05/08/2017, 8:12 AM

## 2017-05-09 LAB — GLUCOSE, CAPILLARY
GLUCOSE-CAPILLARY: 133 mg/dL — AB (ref 65–99)
GLUCOSE-CAPILLARY: 145 mg/dL — AB (ref 65–99)
GLUCOSE-CAPILLARY: 182 mg/dL — AB (ref 65–99)
GLUCOSE-CAPILLARY: 193 mg/dL — AB (ref 65–99)
GLUCOSE-CAPILLARY: 265 mg/dL — AB (ref 65–99)
Glucose-Capillary: 290 mg/dL — ABNORMAL HIGH (ref 65–99)

## 2017-05-09 LAB — BASIC METABOLIC PANEL
ANION GAP: 6 (ref 5–15)
BUN: 22 mg/dL — ABNORMAL HIGH (ref 6–20)
CHLORIDE: 105 mmol/L (ref 101–111)
CO2: 29 mmol/L (ref 22–32)
Calcium: 10.3 mg/dL (ref 8.9–10.3)
Creatinine, Ser: 0.37 mg/dL — ABNORMAL LOW (ref 0.44–1.00)
GFR calc Af Amer: >60 mL/min (ref >60)
Glucose, Bld: 126 mg/dL — ABNORMAL HIGH (ref 65–99)
POTASSIUM: 3.7 mmol/L (ref 3.5–5.1)
SODIUM: 140 mmol/L (ref 135–145)

## 2017-05-09 LAB — T4, FREE: Free T4: 0.89 ng/dL (ref 0.61–1.12)

## 2017-05-09 MED ORDER — INSULIN GLARGINE 100 UNIT/ML ~~LOC~~ SOLN
10.0000 [IU] | Freq: Every day | SUBCUTANEOUS | Status: DC
  Administered 2017-05-09 – 2017-05-11 (×3): 10 [IU] via SUBCUTANEOUS
  Filled 2017-05-09 (×3): qty 0.1

## 2017-05-09 MED ORDER — FUROSEMIDE 10 MG/ML IJ SOLN
10.0000 mg | Freq: Two times a day (BID) | INTRAMUSCULAR | Status: DC
  Administered 2017-05-09 – 2017-05-10 (×2): 10 mg via INTRAVENOUS
  Filled 2017-05-09 (×2): qty 2

## 2017-05-09 MED ORDER — INSULIN ASPART 100 UNIT/ML ~~LOC~~ SOLN
5.0000 [IU] | Freq: Once | SUBCUTANEOUS | Status: AC
  Administered 2017-05-09: 5 [IU] via SUBCUTANEOUS

## 2017-05-09 MED ORDER — LEVETIRACETAM IN NACL 500 MG/100ML IV SOLN
500.0000 mg | Freq: Two times a day (BID) | INTRAVENOUS | Status: DC
  Administered 2017-05-09 – 2017-05-13 (×7): 500 mg via INTRAVENOUS
  Filled 2017-05-09 (×9): qty 100

## 2017-05-09 MED ORDER — POLYETHYLENE GLYCOL 3350 17 G PO PACK
17.0000 g | PACK | Freq: Every day | ORAL | Status: DC
  Administered 2017-05-10 – 2017-05-11 (×2): 17 g via ORAL
  Filled 2017-05-09 (×2): qty 1

## 2017-05-09 NOTE — Progress Notes (Signed)
Dr. Pearline Cables called and advised of cbg of 265. Verbal order provided to give 3 units of sliding scale insulin SQ now and orders for Lantus will be provided later in the day.

## 2017-05-09 NOTE — Progress Notes (Signed)
Subjective: The patient is alert and in no apparent distress.  Objective: Vital signs in last 24 hours: Temp:  [97.4 F (36.3 C)-98.4 F (36.9 C)] 97.4 F (36.3 C) (03/31 0712) Pulse Rate:  [32-86] 56 (03/31 0808) Resp:  [13-17] 14 (03/31 0808) BP: (107-139)/(58-123) 136/65 (03/31 0808) SpO2:  [86 %-100 %] 100 % (03/31 0808) FiO2 (%):  [30 %] 30 % (03/31 0808) Weight:  [53.2 kg (117 lb 4.6 oz)] 53.2 kg (117 lb 4.6 oz) (03/31 0500) Estimated body mass index is 18.93 kg/m as calculated from the following:   Height as of this encounter: 5\' 6"  (1.676 m).   Weight as of this encounter: 53.2 kg (117 lb 4.6 oz).   Intake/Output from previous day: 03/30 0701 - 03/31 0700 In: 3033.3 [P.O.:1300; NG/GT:1518.3; IV Piggyback:215] Out: 696 [Urine:875] Intake/Output this shift: Total I/O In: 220 [NG/GT:220] Out: -   Physical exam Glasgow Coma Scale 11 intubated.  She follows commands.  Lab Results: Recent Labs    05/07/17 0344 05/08/17 0809  WBC 11.2* 10.1  HGB 9.7* 9.6*  HCT 29.8* 29.5*  PLT 148* 142*   BMET Recent Labs    05/08/17 0809 05/09/17 0248  NA 140 140  K 3.7 3.7  CL 107 105  CO2 29 29  GLUCOSE 101* 126*  BUN 24* 22*  CREATININE 0.34* 0.37*  CALCIUM 10.4* 10.3    Studies/Results: Dg Chest Port 1 View  Result Date: 05/08/2017 CLINICAL DATA:  Respiratory failure EXAM: PORTABLE CHEST 1 VIEW COMPARISON:  Three days ago FINDINGS: New retrocardiac opacity with volume loss. Normal heart size and mediastinal contours. Endotracheal tube tip is between the clavicular heads and carina. Feeding tube at least reaches the stomach. The left lung is clear. Artifact from EKG leads. IMPRESSION: Interval left lower lobe collapse. Electronically Signed   By: Monte Fantasia M.D.   On: 05/08/2017 08:14    Assessment/Plan: Neuro: Stable  LOS: 20 days     Ophelia Charter 05/09/2017, 9:05 AM

## 2017-05-09 NOTE — Progress Notes (Signed)
PULMONARY / CRITICAL CARE MEDICINE   Name: Rachel [name] MRN: [id number] DOB: 07/29/1950    ADMISSION DATE:  04/19/2017 CONSULTATION DATE:  04/23/17  REFERRING MD:  Dr. Trenton Gammon   CHIEF COMPLAINT:  Respiratory Insufficiency   HISTORY OF PRESENT ILLNESS:  67 y/o F who presented 3/11 with a 2 day history of worsening right sided weakness.    At baseline, she is dependent for all ADL's. She required assistance to walk, has to be fed.  She has a history of Rosai Dorfman and had previously undergone an occipital craniotomy for removal of lesions in the past.  She recently had a stable appearance of meningeal lesions in the left middle cranial fossa.  She has a hx of schizophrenia and OSA but not on CPAP after at trial with no improvement.    Initial ER evaluation included a CT of the head which showed extensive left sided vasogenic edema involving the external capsule extending to the temporal lobe, parietal lobe with associated mass-effect with compression of the left parietal ventricle, 3 mm left to right midline shift. She was treated with IV decadron and evaluated by NSGY. 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 Post procedure, she failed extubation and required re-intubation.  She was returned to ICU on mechanical ventilation.      SUBJECTIVE: This is postoperative day 21 and she remains too lethargic to extubate.  She is not at all interactive with me today.     T Max 98.2  VITAL SIGNS: BP (!) 130/59   Pulse (!) 46   Temp 97.8 F (36.6 C)   Resp 16   Ht 5\' 6"  (1.676 m)   Wt 117 lb 4.6 oz (53.2 kg)   SpO2 100%   BMI 18.93 kg/m   HEMODYNAMICS:    VENTILATOR SETTINGS: Vent Mode: PRVC FiO2 (%):  [30 %] 30 % Set Rate:  [14 bmp] 14 bmp Vt Set:  [480 mL] 480 mL PEEP:  [5 cmH20] 5 cmH20 Plateau Pressure:  [12 cmH20-21 cmH20] 13 cmH20  INTAKE / OUTPUT: I/O last 3 completed shifts: In: 3793.3 [P.O.:1300;  NG/GT:2178.3; IV Piggyback:315] Out: 4174 [Urine:1475]  PHYSICAL EXAMINATION:  General: Chronically ill-appearing and in no distress.  She is drooling.  He is orally intubated mechanically ventilated     Neuro: She is intermittently partially eye opening but does not track and does not interact. Pupils are equal.  She does move the upper extremities in response to noxious stimuli.  HEENT: Craniotomy site is clean and dry.  , Feeding tube on place and secure  Cardiovascular: S1 and S2 are regular and slow without murmur rub or gallop.       Lungs: Aspirations are unlabored, there is symmetric air movement, no wheezes.   Abdomen: Abdomen is flat and soft without masses or tenderness.     Musculoskeletal: warm to touch. NO obvious deformities  Skin: No rash, warm dry and intact  LABS:  BMET Recent Labs  Lab 05/07/17 0344 05/08/17 0809 05/09/17 0248  NA 137 140 140  K 3.6 3.7 3.7  CL 103 107 105  CO2 29 29 29   BUN 27* 24* 22*  CREATININE 0.36* 0.34* 0.37*  GLUCOSE 154* 101* 126*    Electrolytes Recent Labs  Lab 05/05/17 0255 05/07/17 0344 05/08/17 0809 05/09/17 0248  CALCIUM 10.3 10.2 10.4* 10.3  MG 2.1 2.0 1.9  --   PHOS 2.5  --   --   --  CBC Recent Labs  Lab 05/05/17 0255 05/07/17 0344 05/08/17 0809  WBC 14.6* 11.2* 10.1  HGB 10.7* 9.7* 9.6*  HCT 31.8* 29.8* 29.5*  PLT 217 148* 142*    Coag's No results for input(s): APTT, INR in the last 168 hours.  Sepsis Markers No results for input(s): LATICACIDVEN, PROCALCITON, O2SATVEN in the last 168 hours.  ABG Recent Labs  Lab 05/07/17 0335 05/08/17 1119  PHART 7.486* 7.488*  PCO2ART 42.1 41.2  PO2ART 141* 86.0    Liver Enzymes No results for input(s): AST, ALT, ALKPHOS, BILITOT, ALBUMIN in the last 168 hours.  Cardiac Enzymes No results for input(s): TROPONINI, PROBNP in the last 168 hours.  Glucose Recent Labs  Lab 05/08/17 2041 05/09/17 0015 05/09/17 0414 05/09/17 0713 05/09/17 1205  05/09/17 1550  GLUCAP 226* 182* 133* 145* 265* 290*    Imaging No results found.   STUDIES:  CT Head 3/11 >> Extensive left-sided vasogenic edema involving external capsule, left-sided centrum semi ovale, extending into the temporal lobe and parietal lobe. There is associated mass effect with compression of the left lateral ventricle and 3 mm of left-to-right midline shift at the foramen of Monro. Indistinctness of the left-sided sulci may be secondary to mass effect, however, new metastatic disease or manifestation of the patient's known histiocytosis may be the underlying etiology EEG 3/20 > focal slowing over left hemisphere and mild generalized background slowing, c/w focal cerebral disturbance on left with superimposed encephalopathy pattern.  CULTURES:   ANTIBIOTICS: Cefazolin 3/15 x 1  SIGNIFICANT EVENTS: 3/11 Admit with progressive weakness, edema on CT head  3/15 Crani for resection of tumor, failed extubation   LINES/TUBES: ETT 3/15 >>   DISCUSSION: 67 y/o F admitted 3/11 with increasing weakness.  Known hx of Rosai Dorfman with previously stable CT head.  On admit found to have new edema with midline shift.  To OR 3/15 for left craniotomy for resection of tumor.  Mental status remains poor postoperatively precluding extubation and she has failed a spontaneous breathing trial repeatedly.      ASSESSMENT / PLAN:  PULMONARY A: Acute Respiratory Insufficiency  OSA - not on CPAP  4th Rib Fracture - noted on CXR  Failure to wean>> apnea 3/28 CO2 on ABG 42 No SBT 3/29 am Multiple failed extubation attempts P:    Mechanics look good but she remains frankly too obtunded to extubate.  I have explained to her husband that I am tapering her off of Keppra hoping that her mental status will improve and that I am beginning to diurese some of the third space fluid she accumulated while on Decadron.  Should she fail to separate from mechanical ventilation on 4 /1 I would  encourage tracheostomy.    CARDIOVASCULAR A:  Hx PAF, HTN, HLD  BP well controlled on current regimen Bradycardia P:  She continues to be bradycardic despite stopping all of her beta-blockers.  Repeat free T4 today is normal and we are not using Precedex.  Her mental status is not sufficiently altered that I suspect an intracranial catastrophe.   She had substantial third spacing while on Decadron, and I have introduced a small dose of Lasix.  RENAL A:   Hypokalemia - resolved Anion gap metabolic acidosis - resolved 7L positive for admission , ? Accuracy  P: Minimize volume Trend BMET daily Monitor UO Replete electrolytes as needed   GASTROINTESTINAL A:   At risk Malnutrition  SUP Body mass index is 18.93 kg/m. P:   Tube feeding Protonix  for GI prophylaxis  HEMATOLOGIC A:   VTE prophylaxis Leukocytosis>> downtrending P:  Trend CBC SCD in place Transfuse for HGB of 7  INFECTIOUS A:   No indication of infection P:   Monitor surgical site CXR prn   ENDOCRINE A:   DM  P:    Decadron was discontinued on 3/30 and I simultaneously discontinued her Levemir.  With both interventions it is hard for me to interpret today is relatively high blood sugars.  I have reintroduced a evening dose of Lantus remain vigilant for the possibility that her glucose intolerance represents an inflammatory process.  NEUROLOGIC A:   Left Craniotomy s/p Tumor Removal  Cerebral Edema with Midline Shift  Rosai Dorfman Syndrome  Deconditioning Depression She is still very lethargic.  Her Keppra was in place empirically and I am weaning it to off hoping that she will be less sedated.     Neurosurgery following Neurology signed off 3/22 rec [place] discussion.      Dr. Pearline Cables spoke with her husband on 3/27 , he stated  that with the past surgeries she has been very slow to emerge and that she really did not turn around from a mental status standpoint until her antipsychotics were  reintroduced.  Especially in light of the fact she is now opening her eyes  he would like [place] to push forward for several more days before making any decision about tracheostomy or level of care.  05/09/2017 4:16 PM   Lars Masson, MD Critical Care Pager  [phone]

## 2017-05-10 ENCOUNTER — Inpatient Hospital Stay (HOSPITAL_COMMUNITY): Payer: BLUE CROSS/BLUE SHIELD

## 2017-05-10 LAB — GLUCOSE, CAPILLARY
GLUCOSE-CAPILLARY: 132 mg/dL — AB (ref 65–99)
GLUCOSE-CAPILLARY: 156 mg/dL — AB (ref 65–99)
Glucose-Capillary: 105 mg/dL — ABNORMAL HIGH (ref 65–99)
Glucose-Capillary: 159 mg/dL — ABNORMAL HIGH (ref 65–99)

## 2017-05-10 LAB — CBC WITH DIFFERENTIAL/PLATELET
BASOS PCT: 0 %
Basophils Absolute: 0 10*3/uL (ref 0.0–0.1)
EOS ABS: 0.1 10*3/uL (ref 0.0–0.7)
EOS PCT: 2 %
HCT: 28.6 % — ABNORMAL LOW (ref 36.0–46.0)
Hemoglobin: 9.6 g/dL — ABNORMAL LOW (ref 12.0–15.0)
Lymphocytes Relative: 28 %
Lymphs Abs: 2 10*3/uL (ref 0.7–4.0)
MCH: 31.5 pg (ref 26.0–34.0)
MCHC: 33.6 g/dL (ref 30.0–36.0)
MCV: 93.8 fL (ref 78.0–100.0)
MONOS PCT: 8 %
Monocytes Absolute: 0.6 10*3/uL (ref 0.1–1.0)
NEUTROS PCT: 62 %
Neutro Abs: 4.5 10*3/uL (ref 1.7–7.7)
PLATELETS: 120 10*3/uL — AB (ref 150–400)
RBC: 3.05 MIL/uL — AB (ref 3.87–5.11)
RDW: 14.2 % (ref 11.5–15.5)
WBC: 7.2 10*3/uL (ref 4.0–10.5)

## 2017-05-10 LAB — BASIC METABOLIC PANEL
Anion gap: 8 (ref 5–15)
Anion gap: 6 (ref 5–15)
BUN: 22 mg/dL — ABNORMAL HIGH (ref 6–20)
BUN: 23 mg/dL — AB (ref 6–20)
CALCIUM: 10.2 mg/dL (ref 8.9–10.3)
CHLORIDE: 102 mmol/L (ref 101–111)
CO2: 27 mmol/L (ref 22–32)
CO2: 28 mmol/L (ref 22–32)
CREATININE: 0.37 mg/dL — AB (ref 0.44–1.00)
Calcium: 9.8 mg/dL (ref 8.9–10.3)
Chloride: 105 mmol/L (ref 101–111)
Creatinine, Ser: 0.37 mg/dL — ABNORMAL LOW (ref 0.44–1.00)
GFR calc Af Amer: >60 mL/min (ref >60)
GFR calc non Af Amer: >60 mL/min (ref >60)
GLUCOSE: 168 mg/dL — AB (ref 65–99)
Glucose, Bld: 159 mg/dL — ABNORMAL HIGH (ref 65–99)
Potassium: 3.2 mmol/L — ABNORMAL LOW (ref 3.5–5.1)
Potassium: 4.8 mmol/L (ref 3.5–5.1)
SODIUM: 137 mmol/L (ref 135–145)
Sodium: 139 mmol/L (ref 135–145)

## 2017-05-10 MED ORDER — RISPERIDONE 2 MG PO TABS
2.0000 mg | ORAL_TABLET | Freq: Every day | ORAL | Status: DC
  Administered 2017-05-10 – 2017-05-20 (×10): 2 mg
  Filled 2017-05-10 (×12): qty 1

## 2017-05-10 MED ORDER — POTASSIUM CHLORIDE 20 MEQ/15ML (10%) PO SOLN
40.0000 meq | Freq: Once | ORAL | Status: AC
  Administered 2017-05-10: 40 meq
  Filled 2017-05-10: qty 30

## 2017-05-10 MED ORDER — POTASSIUM CHLORIDE 20 MEQ/15ML (10%) PO SOLN
30.0000 meq | ORAL | Status: AC
  Administered 2017-05-10 (×2): 30 meq
  Filled 2017-05-10 (×2): qty 30

## 2017-05-10 MED ORDER — POTASSIUM CHLORIDE 20 MEQ/15ML (10%) PO SOLN
20.0000 meq | Freq: Two times a day (BID) | ORAL | Status: DC
  Administered 2017-05-10 – 2017-05-13 (×4): 20 meq
  Filled 2017-05-10 (×4): qty 15

## 2017-05-10 MED ORDER — FUROSEMIDE 10 MG/ML IJ SOLN
20.0000 mg | Freq: Once | INTRAMUSCULAR | Status: AC
  Administered 2017-05-10: 20 mg via INTRAVENOUS
  Filled 2017-05-10: qty 2

## 2017-05-10 MED ORDER — PAROXETINE HCL 20 MG PO TABS
40.0000 mg | ORAL_TABLET | Freq: Every day | ORAL | Status: DC
  Administered 2017-05-10: 40 mg
  Filled 2017-05-10 (×2): qty 2

## 2017-05-10 MED ORDER — FUROSEMIDE 10 MG/ML IJ SOLN
10.0000 mg | Freq: Two times a day (BID) | INTRAMUSCULAR | Status: DC
  Administered 2017-05-10 – 2017-05-13 (×5): 10 mg via INTRAVENOUS
  Filled 2017-05-10 (×5): qty 2

## 2017-05-10 NOTE — Progress Notes (Signed)
PULMONARY / CRITICAL CARE MEDICINE   Name: Rachel [name] MRN: [id number] DOB: [birth year]    ADMISSION DATE:  04/19/2017 CONSULTATION DATE:  04/23/17  REFERRING MD:  Dr. Trenton Gammon   CHIEF COMPLAINT:  Respiratory Insufficiency   HISTORY OF PRESENT ILLNESS:  67 y/o F who presented 3/11 with a 2 day history of worsening right sided weakness.    At baseline, she is dependent for all ADL's. She required assistance to walk, has to be fed.  She has a history of Rosai Dorfman and had previously undergone an occipital craniotomy for removal of lesions in the past.  She recently had a stable appearance of meningeal lesions in the left middle cranial fossa.  She has a hx of schizophrenia and OSA but not on CPAP after at trial with no improvement.    Initial ER evaluation included a CT of the head which showed extensive left sided vasogenic edema involving the external capsule extending to the temporal lobe, parietal lobe with associated mass-effect with compression of the left parietal ventricle, 3 mm left to right midline shift. She was treated with IV decadron and evaluated by NSGY. 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 Post procedure, she failed extubation and required re-intubation.  She was returned to ICU on mechanical ventilation.      SUBJECTIVE:  No acute events.  Remains bradycardic but is asymptomatic.  Weaning on PS 5/5; however, mental status remains barrier to considering extubation.  She opens eyes to noxious stimuli but does not follow any commands.   VITAL SIGNS: BP 103/61   Pulse (!) 48   Temp 98 F (36.7 C) (Axillary)   Resp 14   Ht 5\' 6"  (1.676 m)   Wt 53.4 kg (117 lb 11.6 oz)   SpO2 100%   BMI 19.00 kg/m   HEMODYNAMICS:    VENTILATOR SETTINGS: Vent Mode: PSV;CPAP FiO2 (%):  [30 %] 30 % Set Rate:  [14 bmp] 14 bmp Vt Set:  [480 mL] 480 mL PEEP:  [5 cmH20] 5 cmH20 Pressure Support:  [5 cmH20] 5  cmH20 Plateau Pressure:  [13 cmH20-14 cmH20] 14 cmH20  INTAKE / OUTPUT: I/O last 3 completed shifts: In: 2465 [NG/GT:2150; IV Piggyback:315] Out: 1950 [Urine:1950]  PHYSICAL EXAMINATION:  General: Chronically ill-appearing female, in NAD. Neuro: She is intermittently opening eyes to noxious stimuli but does not follow any commands or interact at atll. HEENT: Craniotomy site is clean and dry.  , Feeding tube on place and secure  Cardiovascular: S1 and S2 are regular and slow without murmur rub or gallop.       Lungs: Respirations are unlabored, there is symmetric air movement, no wheezes.   Abdomen: Abdomen is flat and soft without masses or tenderness.     Musculoskeletal: warm to touch. No obvious deformities  Skin: No rash, warm dry and intact  LABS:  BMET Recent Labs  Lab 05/08/17 0809 05/09/17 0248 05/10/17 0309  NA 140 140 137  K 3.7 3.7 3.2*  CL 107 105 102  CO2 29 29 27   BUN 24* 22* 22*  CREATININE 0.34* 0.37* 0.37*  GLUCOSE 101* 126* 159*    Electrolytes Recent Labs  Lab 05/05/17 0255 05/07/17 0344 05/08/17 0809 05/09/17 0248 05/10/17 0309  CALCIUM 10.3 10.2 10.4* 10.3 9.8  MG 2.1 2.0 1.9  --   --   PHOS 2.5  --   --   --   --  CBC Recent Labs  Lab 05/07/17 0344 05/08/17 0809 05/10/17 0309  WBC 11.2* 10.1 7.2  HGB 9.7* 9.6* 9.6*  HCT 29.8* 29.5* 28.6*  PLT 148* 142* 120*    Coag's No results for input(s): APTT, INR in the last 168 hours.  Sepsis Markers No results for input(s): LATICACIDVEN, PROCALCITON, O2SATVEN in the last 168 hours.  ABG Recent Labs  Lab 05/07/17 0335 05/08/17 1119  PHART 7.486* 7.488*  PCO2ART 42.1 41.2  PO2ART 141* 86.0    Liver Enzymes No results for input(s): AST, ALT, ALKPHOS, BILITOT, ALBUMIN in the last 168 hours.  Cardiac Enzymes No results for input(s): TROPONINI, PROBNP in the last 168 hours.  Glucose Recent Labs  Lab 05/09/17 0414 05/09/17 0713 05/09/17 1205 05/09/17 1550 05/09/17 2029  05/10/17 0844  GLUCAP 133* 145* 265* 290* 193* 105*    Imaging Dg Chest Port 1 View  Result Date: 05/10/2017 CLINICAL DATA:  Hypoxia EXAM: PORTABLE CHEST 1 VIEW COMPARISON:  May 08, 2017 FINDINGS: Endotracheal tube tip is 2.3 cm above the carina. Feeding tube tip is below the diaphragm. No pneumothorax. There is no edema or consolidation. The heart size and pulmonary vascularity are normal. No adenopathy. There is aortic atherosclerosis. IMPRESSION: Positions as described without pneumothorax. No edema or consolidation. There is aortic atherosclerosis. Aortic Atherosclerosis (ICD10-I70.0). Electronically Signed   By: Lowella Grip III M.D.   On: 05/10/2017 08:18     STUDIES:  CT Head 3/11 >> Extensive left-sided vasogenic edema involving external capsule, left-sided centrum semi ovale, extending into the temporal lobe and parietal lobe. There is associated mass effect with compression of the left lateral ventricle and 3 mm of left-to-right midline shift at the foramen of Monro. Indistinctness of the left-sided sulci may be secondary to mass effect, however, new metastatic disease or manifestation of the patient's known histiocytosis may be the underlying etiology EEG 3/20 > focal slowing over left hemisphere and mild generalized background slowing, c/w focal cerebral disturbance on left with superimposed encephalopathy pattern.  CULTURES:   ANTIBIOTICS: Cefazolin 3/15 x 1  SIGNIFICANT EVENTS: 3/11 Admit with progressive weakness, edema on CT head  3/15 Crani for resection of tumor, failed extubation   LINES/TUBES: ETT 3/15 >>   DISCUSSION: 67 y/o F admitted 3/11 with increasing weakness.  Known hx of Rosai Dorfman with previously stable CT head.  On admit found to have new edema with midline shift.  To OR 3/15 for left craniotomy for resection of tumor.  Mental status remains poor postoperatively precluding extubation and she has failed a spontaneous breathing trial repeatedly.        ASSESSMENT / PLAN:  PULMONARY A: Acute Respiratory Insufficiency - failed extubation post neurosurgery procedure.  Re-intubated and since then has failed weaning on multiple occasions. OSA - not on CPAP.  4th Rib Fracture - noted on CXR . P:   Will likely need tracheostomy if husband wishes for aggressive care (he is leaning towards "no more cutting on her"). If no trach then will need one way extubation. Bronchial hygiene. Follow CXR intermittently.  CARDIOVASCULAR A:  Hx PAF, HTN, HLD. BP well controlled on current regimen. Bradycardia - asymptomatic. Volume overload. P:  She continues to be bradycardic despite stopping all of her beta-blockers.  Repeat free T4 today is normal and we are not using Precedex.  Her mental status is not sufficiently altered that I suspect an intracranial catastrophe.    Continue gentle diuresis.  RENAL A:   Hypokalemia. Anion gap metabolic acidosis -  resolved. Volume overload - 5.6L positive for admission. P: 60mEq K per tube. One time dose 20mg  lasix now, then 10mg  BID with K supplementation. Minimize volume. Trend BMET daily.  GASTROINTESTINAL A:   At risk Malnutrition . SUP. P:   Continue tube feeding. Protonix for GI prophylaxis.  HEMATOLOGIC A:   VTE prophylaxis. P:  SCD's. Follow CBC.  INFECTIOUS A:   No indication of infection. P:   Monitor clinically.  ENDOCRINE A:   DM. P:    Continue SSI, lantus.  NEUROLOGIC A:   Left Craniotomy s/p Tumor Removal . Cerebral Edema with Midline Shift. Rosai Dorfman Syndrome.  Deconditioning. Depression. P: Palliative care consult. Neurosurgery following.   Continue keppra. Would favor d/c paroxetine, risperidone; however, husband adamant that "she won't wake up until she gets her depression meds".  Therefore, will continue.  Dr. Pearline Cables spoke with her husband on 3/27 , he stated  that with the past surgeries she has been very slow to emerge and that she really did not  turn around from a mental status standpoint until her antipsychotics were reintroduced.  In light of the fact she is now opening her eyes  he would like [place] to push forward for several more days before making any decision about tracheostomy or level of care.  I updated husband again 4/1 and explained that if he wanted to pursue aggressive care, she would need tracheostomy given that it has been over 2 weeks since initial intubation.  He tells me that he is leaning towards "no more cutting on her".  He wants to wait 1 - 2 days before making further decisions.   CC time: 30 min.   Montey Hora, [facility] Pager: [phone]  or [phone] 05/10/2017, 10:32 AM

## 2017-05-10 NOTE — Progress Notes (Signed)
Palliative:  I have received consult and I have left voicemail requesting to meet with Mr. Munar tomorrow 05/11/17. Will await return call and will attempt to speak with husband tomorrow. Thank you for this consult.   No charge  Vinie Sill, NP Palliative Medicine Team Pager # [phone] (M-F 8a-5p) Team Phone # [phone] (Nights/Weekends)

## 2017-05-10 NOTE — Progress Notes (Signed)
[facility] ADULT ICU REPLACEMENT PROTOCOL FOR AM LAB REPLACEMENT ONLY  The patient does apply for the [facility] Adult ICU Electrolyte Replacment Protocol based on the criteria listed below:   1. Is GFR >/= 40 ml/min? Yes.    Patient's GFR today is >60 2. Is urine output >/= 0.5 ml/kg/hr for the last 6 hours? Yes.   Patient's UOP is 3.4 ml/kg/hr 3. Is BUN < 60 mg/dL? Yes.    Patient's BUN today  Is 22. Abnormal electrolyte(s): K+=3.2 5. Ordered repletion with: **per protocol* 6. If a panic level lab has been reported, has the CCM MD in charge been notified? Yes.  .   Physician:  Johann Capers, Cindra Presume 05/10/2017 6:52 AM

## 2017-05-10 NOTE — Progress Notes (Signed)
No significant change in status.  Patient awakens but appears minimally aware.  Not following commands.  Not making progress with weaning.  Decision pending regarding tracheostomy versus one-way extubation.  No new recommendations.

## 2017-05-11 ENCOUNTER — Inpatient Hospital Stay (HOSPITAL_COMMUNITY): Payer: BLUE CROSS/BLUE SHIELD

## 2017-05-11 LAB — BASIC METABOLIC PANEL
Anion gap: 8 (ref 5–15)
BUN: 22 mg/dL — ABNORMAL HIGH (ref 6–20)
CALCIUM: 10.8 mg/dL — AB (ref 8.9–10.3)
CO2: 28 mmol/L (ref 22–32)
CREATININE: 0.38 mg/dL — AB (ref 0.44–1.00)
Chloride: 104 mmol/L (ref 101–111)
GFR calc non Af Amer: >60 mL/min (ref >60)
GLUCOSE: 113 mg/dL — AB (ref 65–99)
Potassium: 4.4 mmol/L (ref 3.5–5.1)
Sodium: 140 mmol/L (ref 135–145)

## 2017-05-11 LAB — CBC
HCT: 33.9 % — ABNORMAL LOW (ref 36.0–46.0)
Hemoglobin: 10.8 g/dL — ABNORMAL LOW (ref 12.0–15.0)
MCH: 30.1 pg (ref 26.0–34.0)
MCHC: 31.9 g/dL (ref 30.0–36.0)
MCV: 94.4 fL (ref 78.0–100.0)
PLATELETS: 133 10*3/uL — AB (ref 150–400)
RBC: 3.59 MIL/uL — ABNORMAL LOW (ref 3.87–5.11)
RDW: 14.3 % (ref 11.5–15.5)
WBC: 7.2 10*3/uL (ref 4.0–10.5)

## 2017-05-11 LAB — GLUCOSE, CAPILLARY
GLUCOSE-CAPILLARY: 135 mg/dL — AB (ref 65–99)
GLUCOSE-CAPILLARY: 176 mg/dL — AB (ref 65–99)
GLUCOSE-CAPILLARY: 196 mg/dL — AB (ref 65–99)
Glucose-Capillary: 96 mg/dL (ref 65–99)
Glucose-Capillary: 156 mg/dL — ABNORMAL HIGH (ref 65–99)
Glucose-Capillary: 188 mg/dL — ABNORMAL HIGH (ref 65–99)
Glucose-Capillary: 180 mg/dL — ABNORMAL HIGH (ref 65–99)
Glucose-Capillary: 125 mg/dL — ABNORMAL HIGH (ref 65–99)
Glucose-Capillary: 147 mg/dL — ABNORMAL HIGH (ref 65–99)

## 2017-05-11 LAB — PHOSPHORUS: PHOSPHORUS: 2.3 mg/dL — AB (ref 2.5–4.6)

## 2017-05-11 LAB — MAGNESIUM: Magnesium: 2.1 mg/dL (ref 1.7–2.4)

## 2017-05-11 MED ORDER — PAROXETINE HCL 20 MG PO TABS
40.0000 mg | ORAL_TABLET | Freq: Every day | ORAL | Status: DC
  Administered 2017-05-11 – 2017-05-20 (×10): 40 mg
  Filled 2017-05-11 (×12): qty 2

## 2017-05-11 NOTE — Progress Notes (Signed)
PULMONARY / CRITICAL CARE MEDICINE   Name: Rachel [name] MRN: [id number] DOB: 06-Sep-1950    ADMISSION DATE:  04/19/2017 CONSULTATION DATE:  04/23/17  REFERRING MD:  Dr. Trenton Gammon   CHIEF COMPLAINT:  Respiratory Insufficiency   HISTORY OF PRESENT ILLNESS:  67 y/o F who presented 3/11 with a 2 day history of worsening right sided weakness.    At baseline, she is dependent for all ADL's. She required assistance to walk, has to be fed.  She has a history of Rosai Dorfman and had previously undergone an occipital craniotomy for removal of lesions in the past.  She recently had a stable appearance of meningeal lesions in the left middle cranial fossa.  She has a hx of schizophrenia and OSA but not on CPAP after at trial with no improvement.    Initial ER evaluation included a CT of the head which showed extensive left sided vasogenic edema involving the external capsule extending to the temporal lobe, parietal lobe with associated mass-effect with compression of the left parietal ventricle, 3 mm left to right midline shift. She was treated with IV decadron and evaluated by NSGY. 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 Post procedure, she failed extubation and required re-intubation.  She was returned to ICU on mechanical ventilation.      SUBJECTIVE:  No acute events.  Remains bradycardic but is asymptomatic.  No change in mental status.  Palliative care to hopefully meet with husband today 4/2.   VITAL SIGNS: BP 109/61   Pulse (!) 56   Temp 97.9 F (36.6 C) (Oral)   Resp 19   Ht 5\' 6"  (1.676 m)   Wt 53.6 kg (118 lb 2.7 oz)   SpO2 100%   BMI 19.07 kg/m   HEMODYNAMICS:    VENTILATOR SETTINGS: Vent Mode: PRVC FiO2 (%):  [30 %] 30 % Set Rate:  [14 bmp] 14 bmp Vt Set:  [480 mL] 480 mL PEEP:  [5 cmH20] 5 cmH20 Pressure Support:  [5 cmH20] 5 cmH20 Plateau Pressure:  [13 cmH20-17 cmH20] 13 cmH20  INTAKE /  OUTPUT: I/O last 3 completed shifts: In: 2280 [NG/GT:1980; IV Piggyback:300] Out: 3400 [Urine:3400]  PHYSICAL EXAMINATION:  General: Chronically ill-appearing female, in NAD. Neuro: She is intermittently opening eyes to noxious stimuli but does not follow any commands or interact at all. HEENT: Craniotomy site is clean and dry.  Feeding tube on place and secure  Cardiovascular: S1 and S2 are regular and slow without murmur rub or gallop.       Lungs: Respirations are unlabored, there is symmetric air movement, no wheezes.   Abdomen: Abdomen is flat and soft without masses or tenderness.     Musculoskeletal: warm to touch. No obvious deformities  Skin: No rash, warm dry and intact  LABS:  BMET Recent Labs  Lab 05/10/17 0309 05/10/17 1947 05/11/17 0546  NA 137 139 140  K 3.2* 4.8 4.4  CL 102 105 104  CO2 27 28 28   BUN 22* 23* 22*  CREATININE 0.37* 0.37* 0.38*  GLUCOSE 159* 168* 113*    Electrolytes Recent Labs  Lab 05/05/17 0255 05/07/17 0344 05/08/17 0809  05/10/17 0309 05/10/17 1947 05/11/17 0546  CALCIUM 10.3 10.2 10.4*   < > 9.8 10.2 10.8*  MG 2.1 2.0 1.9  --   --   --  2.1  PHOS 2.5  --   --   --   --   --  2.3*   < > = values in this interval not displayed.    CBC Recent Labs  Lab 05/08/17 0809 05/10/17 0309 05/11/17 0546  WBC 10.1 7.2 7.2  HGB 9.6* 9.6* 10.8*  HCT 29.5* 28.6* 33.9*  PLT 142* 120* 133*    Coag's No results for input(s): APTT, INR in the last 168 hours.  Sepsis Markers No results for input(s): LATICACIDVEN, PROCALCITON, O2SATVEN in the last 168 hours.  ABG Recent Labs  Lab 05/07/17 0335 05/08/17 1119  PHART 7.486* 7.488*  PCO2ART 42.1 41.2  PO2ART 141* 86.0    Liver Enzymes No results for input(s): AST, ALT, ALKPHOS, BILITOT, ALBUMIN in the last 168 hours.  Cardiac Enzymes No results for input(s): TROPONINI, PROBNP in the last 168 hours.  Glucose Recent Labs  Lab 05/10/17 0844 05/10/17 1210 05/10/17 1622  05/10/17 2021 05/11/17 0002 05/11/17 0417  GLUCAP 105* 159* 132* 156* 196* 96    Imaging Dg Chest Port 1 View  Result Date: 05/11/2017 CLINICAL DATA:  Respiratory failure EXAM: PORTABLE CHEST 1 VIEW COMPARISON:  05/10/2017. FINDINGS: Support tubes and lines are stable. ET tube 3.6 cm above carina. Normal heart size. Thoracic atherosclerosis. No consolidation or edema. IMPRESSION: Stable chest. Electronically Signed   By: Staci Righter M.D.   On: 05/11/2017 07:44   Dg Chest Port 1 View  Result Date: 05/10/2017 CLINICAL DATA:  Hypoxia EXAM: PORTABLE CHEST 1 VIEW COMPARISON:  May 08, 2017 FINDINGS: Endotracheal tube tip is 2.3 cm above the carina. Feeding tube tip is below the diaphragm. No pneumothorax. There is no edema or consolidation. The heart size and pulmonary vascularity are normal. No adenopathy. There is aortic atherosclerosis. IMPRESSION: Positions as described without pneumothorax. No edema or consolidation. There is aortic atherosclerosis. Aortic Atherosclerosis (ICD10-I70.0). Electronically Signed   By: Lowella Grip III M.D.   On: 05/10/2017 08:18     STUDIES:  CT Head 3/11 >> Extensive left-sided vasogenic edema involving external capsule, left-sided centrum semi ovale, extending into the temporal lobe and parietal lobe. There is associated mass effect with compression of the left lateral ventricle and 3 mm of left-to-right midline shift at the foramen of Monro. Indistinctness of the left-sided sulci may be secondary to mass effect, however, new metastatic disease or manifestation of the patient's known histiocytosis may be the underlying etiology EEG 3/20 > focal slowing over left hemisphere and mild generalized background slowing, c/w focal cerebral disturbance on left with superimposed encephalopathy pattern.  CULTURES:   ANTIBIOTICS: Cefazolin 3/15 x 1  SIGNIFICANT EVENTS: 3/11 Admit with progressive weakness, edema on CT head  3/15 Crani for resection of tumor,  failed extubation   LINES/TUBES: ETT 3/15 >>   DISCUSSION: 67 y/o F admitted 3/11 with increasing weakness.  Known hx of Rosai Dorfman with previously stable CT head.  On admit found to have new edema with midline shift.  To OR 3/15 for left craniotomy for resection of tumor.  Mental status remains poor postoperatively precluding extubation and she has failed a spontaneous breathing trial repeatedly.       ASSESSMENT / PLAN:  PULMONARY A: Acute Respiratory Insufficiency - failed extubation post neurosurgery procedure.  Re-intubated and since then has failed weaning on multiple occasions. OSA - not on CPAP.  4th Rib Fracture - noted on CXR . P:   Will likely need tracheostomy if husband wishes for aggressive care (he is leaning towards "no more cutting on her").  Palliative care planning on meeting with husband today 4/2 hopefully. If  no trach then will need one way extubation. Bronchial hygiene. Follow CXR intermittently.  CARDIOVASCULAR A:  Hx PAF, HTN, HLD. BP well controlled on current regimen. Bradycardia - asymptomatic. Volume overload. P:  She continues to be bradycardic despite stopping all of her beta-blockers.  Repeat free T4 today is normal and we are not using Precedex.  Her mental status is not sufficiently altered that I suspect an intracranial catastrophe.    Continue gentle diuresis.  RENAL A:   Hypokalemia - resolved. Anion gap metabolic acidosis - resolved. Volume overload - 3L positive for admission. P: Continue 10mg  BID with K supplementation. Minimize volume. Trend BMET daily.  GASTROINTESTINAL A:   At risk Malnutrition . SUP. P:   Continue tube feeding. Protonix for GI prophylaxis.  HEMATOLOGIC A:   VTE prophylaxis. P:  SCD's. Follow CBC.  INFECTIOUS A:   No indication of infection. P:   Monitor clinically.  ENDOCRINE A:   DM. P:    Continue SSI, lantus.  NEUROLOGIC A:   Left Craniotomy s/p Tumor Removal . Cerebral Edema with  Midline Shift. Rosai Dorfman Syndrome.  Deconditioning. Depression. P: Palliative care consult, hopefully meeting with husband today 4/2. Neurosurgery following.   Continue keppra. Would favor d/c paroxetine, risperidone; however, husband adamant that "she won't wake up until she gets her depression meds".  Therefore, will continue.  Dr. Pearline Cables spoke with her husband on 3/27 , he stated  that with the past surgeries she has been very slow to emerge and that she really did not turn around from a mental status standpoint until her antipsychotics were reintroduced.  In light of the fact she is now opening her eyes  he would like [place] to push forward for several more days before making any decision about tracheostomy or level of care.  I updated husband again 4/1 and explained that if he wanted to pursue aggressive care, she would need tracheostomy given that it has been over 2 weeks since initial intubation.  He tells me that he is leaning towards "no more cutting on her".  He wants to wait 1 - 2 days before making further decisions.   Palliative care to hopefully meet with husband today 4/2 for goals of care.  CC time: 30 min.   Montey Hora, [facility] Pager: [phone]  or [phone] 05/11/2017, 8:07 AM

## 2017-05-11 NOTE — Progress Notes (Signed)
   05/11/17 1000  Clinical Encounter Type  Visited With Family;Patient  Visit Type Follow-up  Referral From Chaplain  Consult/Referral To Chaplain  Spiritual Encounters  Spiritual Needs Emotional  Stress Factors  Family Stress Factors Health changes;Major life changes  Chaplain visited with PT husband that was sitting at bedside.  He was very prayerful and expressed that his wife had been through a lot.  Chaplain remain present in conversation with the PT husband to further unpack any concerns, practicing the ministry of presence.

## 2017-05-11 NOTE — Consult Note (Signed)
Consultation Note Date: 05/11/2017   Patient Name: Rachel [name]  DOB: [birth year]  MRN: [id number]  Age / Sex: 67 y.o., female  PCP: Rachel Beard, MD Referring Physician: Tarry Kos, MD  Reason for Consultation: Establishing goals of care  HPI/Patient Profile: 67 y.o. female  with past medical history of Rosai-Dorfman's disease, atrial fibrillation, HTN, HLD, DM type 2, schizophrenia, blindness admitted on 04/19/2017 with flaccid right arm and aphasia r/t brain edema s/t increasing size of known brain lesions treated with steroids and surgical resection 04/23/17. She has remained vent dependent with poor neurological recovery.   Clinical Assessment and Goals of Care: I met today at [name]'s bedside with her husband of >40 yrs, Johnny. Rachel [name] has had many conversations with PCCM over the past ~1 week regarding extubation vs tracheostomy. Rachel [name] shares with me today "we are not cutting on her anymore." We further explored what this meant and our expectations without tracheostomy and that we do not expect her to survive once extubated. He has good understanding and has discussed with his 2 sons and family and they all agree that she has suffered enough over the past 20 years. He is teary-eyed but confident that he does not want trach for her. We discussed the next step is one way extubation and timing. Explained that she has already been vented longer than recommended and would be prudent to remove extubate soon. He agrees to extubate tomorrow at 1000 am. Encouraged him to have all family/friends that desires to be present at this time. We also discussed DNR and he agrees and understands. Emotional support provided for very difficult situation and decisions made today.   Primary Decision Maker NEXT OF KIN/HCPOA husband Johnny    SUMMARY OF RECOMMENDATIONS   - DNR - One way extubation tomorrow  morning 1000 am  Code Status/Advance Care Planning:  DNR   Symptom Management:   No current symptoms to manage.   Palliative Prophylaxis:   Aspiration, Delirium Protocol, Frequent Pain Assessment, Oral Care and Turn Reposition  Additional Recommendations (Limitations, Scope, Preferences):  No Tracheostomy  Psycho-social/Spiritual:   Desire for further Chaplaincy support:yes  Additional Recommendations: Compassionate Wean Education  Prognosis:   Hours - Days  Discharge Planning: Anticipated Hospital Death      Primary Diagnoses: Present on Admission: . Hypokalemia . Rosai-Dorfman disease ([place]) . PAF (paroxysmal atrial fibrillation) ([place]) . HTN (hypertension) . Acute encephalopathy . Brain edema ([place]) . Neoplasm of brain causing mass effect on adjacent structures ([place]) . Hypercalcemia   I have reviewed the medical record, interviewed the patient and family, and examined the patient. The following aspects are pertinent.  Past Medical History:  Diagnosis Date  . Blindness   . Diabetes mellitus type I ([place])   . GERD (gastroesophageal reflux disease)   . Hyperlipidemia   . Paroxysmal atrial fibrillation ([facility])   . Schizophrenia ([place])   . Sleep apnea   . Subarachnoid hemorrhage ([place])   . Systemic hypertension   . Vertigo    Social History  Socioeconomic History  . Marital status: Married    Spouse name: Not on file  . Number of children: Not on file  . Years of education: Not on file  . Highest education level: Not on file  Occupational History  . Not on file  Social Needs  . Financial resource strain: Not on file  . Food insecurity:    Worry: Not on file    Inability: Not on file  . Transportation needs:    Medical: Not on file    Non-medical: Not on file  Tobacco Use  . Smoking status: Never Smoker  . Smokeless tobacco: Never Used  Substance and Sexual Activity  . Alcohol use: No  . Drug use: No  . Sexual activity: Not on file  Lifestyle   . Physical activity:    Days per week: Not on file    Minutes per session: Not on file  . Stress: Not on file  Relationships  . Social connections:    Talks on phone: Not on file    Gets together: Not on file    Attends religious service: Not on file    Active member of club or organization: Not on file    Attends meetings of clubs or organizations: Not on file    Relationship status: Not on file  Other Topics Concern  . Not on file  Social History Narrative   Living home with husband, Rachel [name].  Wheelchair bound, Husband walks her around house.  Children Grown 2.  Caffiene- rare.     Family History  Problem Relation Age of Onset  . ADD / ADHD Neg Hx   . Alcohol abuse Neg Hx   . Drug abuse Neg Hx   . Anxiety disorder Neg Hx   . Bipolar disorder Neg Hx   . Dementia Neg Hx   . Depression Neg Hx   . OCD Neg Hx   . Paranoid behavior Neg Hx   . Schizophrenia Neg Hx   . Seizures Neg Hx   . Sexual abuse Neg Hx   . Physical abuse Neg Hx    Scheduled Meds: . chlorhexidine gluconate (MEDLINE KIT)  15 mL Mouth Rinse BID  . furosemide  10 mg Intravenous Q12H  . heparin injection (subcutaneous)  5,000 Units Subcutaneous Q12H  . insulin aspart  1-3 Units Subcutaneous Q4H  . insulin glargine  10 Units Subcutaneous QHS  . mouth rinse  15 mL Mouth Rinse 10 times per day  . multivitamin  15 mL Per Tube Daily  . pantoprazole sodium  40 mg Per Tube Daily  . PARoxetine  40 mg Per Tube Daily  . polyethylene glycol  17 g Oral Daily  . potassium chloride  20 mEq Per Tube BID  . risperiDONE  2 mg Per Tube QHS   Continuous Infusions: . feeding supplement (VITAL HIGH PROTEIN) 55 mL/hr at 05/11/17 0700  . levETIRAcetam 500 mg (05/11/17 1007)   PRN Meds:.acetaminophen **OR** acetaminophen, albuterol, docusate, fentaNYL (SUBLIMAZE) injection, hydrALAZINE, naLOXone (NARCAN)  injection, ondansetron **OR** ondansetron (ZOFRAN) IV Allergies  Allergen Reactions  . Sulfa Antibiotics Anaphylaxis  .  Ibuprofen Other (See Comments)    unknown  . Shellfish Allergy Swelling    Mouth   Review of Systems  Unable to perform ROS: Acuity of condition    Physical Exam  Constitutional: She appears well-developed. She has a sickly appearance. She appears ill. She is intubated.  Thin, frail, appears older than stated age  HENT:  Head: Normocephalic and  atraumatic.  Cardiovascular: Bradycardia present.  Pulmonary/Chest: No accessory muscle usage. No tachypnea. She is intubated. No respiratory distress. She has decreased breath sounds.  Abdominal: Soft. Normal appearance.  Neurological:  Nonpurposeful eye opening and movements  Nursing note and vitals reviewed.   Vital Signs: BP 117/70   Pulse (!) 52   Temp 98 F (36.7 C) (Oral)   Resp 14   Ht 5' 6"  (1.676 m)   Wt 53.6 kg (118 lb 2.7 oz)   SpO2 100%   BMI 19.07 kg/m  Pain Scale: CPOT POSS *See Group Information*: 1-Acceptable,Awake and alert Pain Score: Asleep   SpO2: SpO2: 100 % O2 Device:SpO2: 100 % O2 Flow Rate: .O2 Flow Rate (L/min): 30 L/min  IO: Intake/output summary:   Intake/Output Summary (Last 24 hours) at 05/11/2017 1008 Last data filed at 05/11/2017 0700 Gross per 24 hour  Intake 1255 ml  Output 2300 ml  Net -1045 ml    LBM: Last BM Date: 05/11/17 Baseline Weight: Weight: 59 kg (130 lb) Most recent weight: Weight: 53.6 kg (118 lb 2.7 oz)     Palliative Assessment/Data:10%     Time Total: 35 min  Greater than 50%  of this time was spent counseling and coordinating care related to the above assessment and plan.  Signed by: Vinie Sill, NP Palliative Medicine Team Pager # [phone] (M-F 8a-5p) Team Phone # [phone] (Nights/Weekends)

## 2017-05-11 NOTE — Progress Notes (Signed)
PCCM Interval Progress Note  Received call from Vinie Sill, NP with palliative care with an update after she met with pt's husband.  Decision made to proceed with one way extubation tomorrow 05/12/17 and DNR status.  Appreciate palliative care assistance.   Montey Hora, [facility] Pager: [phone]  or [phone] 05/11/2017, 10:11 AM

## 2017-05-11 NOTE — Progress Notes (Signed)
Palliative:  Full note to follow. I met with Rachel [name] husband and we discussed plan for one way extubation tomorrow 1000 am. Discussed DNR and no reintubation and comfort care if she is unable to breathe on her own. He says he has already discussed this with their 2 sons as well.   Vinie Sill, NP Palliative Medicine Team Pager # [phone] (M-F 8a-5p) Team Phone # [phone] (Nights/Weekends)

## 2017-05-12 DIAGNOSIS — Z515 Encounter for palliative care: Secondary | ICD-10-CM

## 2017-05-12 DIAGNOSIS — Z7189 Other specified counseling: Secondary | ICD-10-CM

## 2017-05-12 DIAGNOSIS — Z9911 Dependence on respirator [ventilator] status: Secondary | ICD-10-CM

## 2017-05-12 DIAGNOSIS — D496 Neoplasm of unspecified behavior of brain: Secondary | ICD-10-CM

## 2017-05-12 LAB — CBC
HEMATOCRIT: 31 % — AB (ref 36.0–46.0)
Hemoglobin: 10.2 g/dL — ABNORMAL LOW (ref 12.0–15.0)
MCH: 30.4 pg (ref 26.0–34.0)
MCHC: 32.9 g/dL (ref 30.0–36.0)
MCV: 92.5 fL (ref 78.0–100.0)
Platelets: 136 10*3/uL — ABNORMAL LOW (ref 150–400)
RBC: 3.35 MIL/uL — ABNORMAL LOW (ref 3.87–5.11)
RDW: 14.7 % (ref 11.5–15.5)
WBC: 7.2 10*3/uL (ref 4.0–10.5)

## 2017-05-12 LAB — GLUCOSE, CAPILLARY
GLUCOSE-CAPILLARY: 89 mg/dL (ref 65–99)
Glucose-Capillary: 101 mg/dL — ABNORMAL HIGH (ref 65–99)
Glucose-Capillary: 54 mg/dL — ABNORMAL LOW (ref 65–99)
Glucose-Capillary: 78 mg/dL (ref 65–99)
Glucose-Capillary: 87 mg/dL (ref 65–99)

## 2017-05-12 LAB — BASIC METABOLIC PANEL
Anion gap: 8 (ref 5–15)
BUN: 23 mg/dL — AB (ref 6–20)
CALCIUM: 10.4 mg/dL — AB (ref 8.9–10.3)
CO2: 24 mmol/L (ref 22–32)
CREATININE: 0.4 mg/dL — AB (ref 0.44–1.00)
Chloride: 106 mmol/L (ref 101–111)
GFR calc non Af Amer: >60 mL/min (ref >60)
GLUCOSE: 148 mg/dL — AB (ref 65–99)
Potassium: 3.9 mmol/L (ref 3.5–5.1)
Sodium: 138 mmol/L (ref 135–145)

## 2017-05-12 LAB — MAGNESIUM: Magnesium: 2.2 mg/dL (ref 1.7–2.4)

## 2017-05-12 LAB — PHOSPHORUS: Phosphorus: 2.5 mg/dL (ref 2.5–4.6)

## 2017-05-12 MED ORDER — GLYCOPYRROLATE 0.2 MG/ML IJ SOLN
0.4000 mg | INTRAMUSCULAR | Status: AC
  Administered 2017-05-12: 0.4 mg via INTRAVENOUS
  Filled 2017-05-12: qty 2

## 2017-05-12 MED ORDER — PANTOPRAZOLE SODIUM 40 MG PO PACK
40.0000 mg | PACK | Freq: Every day | ORAL | Status: DC
Start: 2017-05-12 — End: 2017-05-21
  Administered 2017-05-12 – 2017-05-21 (×10): 40 mg
  Filled 2017-05-12 (×11): qty 20

## 2017-05-12 MED ORDER — HEPARIN SODIUM (PORCINE) 5000 UNIT/ML IJ SOLN
5000.0000 [IU] | Freq: Two times a day (BID) | INTRAMUSCULAR | Status: DC
  Administered 2017-05-12 – 2017-05-21 (×19): 5000 [IU] via SUBCUTANEOUS
  Filled 2017-05-12 (×20): qty 1

## 2017-05-12 MED ORDER — ADULT MULTIVITAMIN LIQUID CH
15.0000 mL | Freq: Every day | ORAL | Status: DC
Start: 2017-05-12 — End: 2017-05-21
  Administered 2017-05-12 – 2017-05-20 (×9): 15 mL
  Filled 2017-05-12 (×12): qty 15

## 2017-05-12 MED ORDER — DEXTROSE 50 % IV SOLN
INTRAVENOUS | Status: AC
  Administered 2017-05-12: 25 mL
  Filled 2017-05-12: qty 50

## 2017-05-12 MED ORDER — OSMOLITE 1.2 CAL PO LIQD
1000.0000 mL | ORAL | Status: DC
  Administered 2017-05-12 – 2017-05-18 (×7): 1000 mL
  Filled 2017-05-12 (×12): qty 1000

## 2017-05-12 MED ORDER — INSULIN GLARGINE 100 UNIT/ML ~~LOC~~ SOLN
10.0000 [IU] | Freq: Every day | SUBCUTANEOUS | Status: DC
  Administered 2017-05-13 – 2017-05-18 (×7): 10 [IU] via SUBCUTANEOUS
  Filled 2017-05-12 (×9): qty 0.1

## 2017-05-12 MED ORDER — DEXTROSE 50 % IV SOLN: 25.0000 mL | Freq: Once | INTRAVENOUS | Status: DC

## 2017-05-12 MED ORDER — LORAZEPAM 2 MG/ML IJ SOLN
0.5000 mg | INTRAMUSCULAR | Status: DC | PRN
  Administered 2017-05-13 – 2017-05-15 (×3): 0.5 mg via INTRAVENOUS
  Filled 2017-05-12 (×3): qty 1

## 2017-05-12 MED ORDER — LORAZEPAM 2 MG/ML IJ SOLN: 0.5000 mg | INTRAMUSCULAR | Status: DC | PRN

## 2017-05-12 MED ORDER — MORPHINE SULFATE (PF) 4 MG/ML IV SOLN: 2.0000 mg | INTRAVENOUS | Status: DC | PRN

## 2017-05-12 MED ORDER — INSULIN ASPART 100 UNIT/ML ~~LOC~~ SOLN
1.0000 [IU] | SUBCUTANEOUS | Status: DC
  Administered 2017-05-13 (×2): 3 [IU] via SUBCUTANEOUS
  Administered 2017-05-13: 2 [IU] via SUBCUTANEOUS
  Administered 2017-05-13: 3 [IU] via SUBCUTANEOUS
  Administered 2017-05-14: 2 [IU] via SUBCUTANEOUS
  Administered 2017-05-14 (×3): 3 [IU] via SUBCUTANEOUS
  Administered 2017-05-14: 1 [IU] via SUBCUTANEOUS
  Administered 2017-05-15: 2 [IU] via SUBCUTANEOUS
  Administered 2017-05-15 (×2): 1 [IU] via SUBCUTANEOUS
  Administered 2017-05-15: 3 [IU] via SUBCUTANEOUS
  Administered 2017-05-15: 2 [IU] via SUBCUTANEOUS
  Administered 2017-05-15: 1 [IU] via SUBCUTANEOUS
  Administered 2017-05-16 (×3): 2 [IU] via SUBCUTANEOUS
  Administered 2017-05-16: 1 [IU] via SUBCUTANEOUS
  Administered 2017-05-16: 3 [IU] via SUBCUTANEOUS
  Administered 2017-05-16: 2 [IU] via SUBCUTANEOUS
  Administered 2017-05-17: 3 [IU] via SUBCUTANEOUS
  Administered 2017-05-17: 2 [IU] via SUBCUTANEOUS
  Administered 2017-05-17 – 2017-05-18 (×5): 3 [IU] via SUBCUTANEOUS
  Administered 2017-05-18: 2 [IU] via SUBCUTANEOUS

## 2017-05-12 MED ORDER — DOCUSATE SODIUM 50 MG/5ML PO LIQD: 100.0000 mg | Freq: Two times a day (BID) | ORAL | Status: DC | PRN

## 2017-05-12 MED ORDER — MORPHINE SULFATE (PF) 4 MG/ML IV SOLN
2.0000 mg | INTRAVENOUS | Status: DC | PRN
  Administered 2017-05-12: 2 mg via INTRAVENOUS
  Filled 2017-05-12: qty 1

## 2017-05-12 MED ORDER — VITAL HIGH PROTEIN PO LIQD
1000.0000 mL | ORAL | Status: DC
  Administered 2017-05-12: 1000 mL

## 2017-05-12 MED ORDER — POLYETHYLENE GLYCOL 3350 17 G PO PACK
17.0000 g | PACK | Freq: Every day | ORAL | Status: DC
  Administered 2017-05-12: 17 g via ORAL
  Filled 2017-05-12 (×2): qty 1

## 2017-05-12 NOTE — Progress Notes (Signed)
1726 Received patient as a transfer. Patient resting in bed comfortably. Husband at bedside. Will continue to monitor.

## 2017-05-12 NOTE — Progress Notes (Signed)
Confirmed with Dr. Elwyn Reach- patient does not need to be on tele or continuous pulse ox.

## 2017-05-12 NOTE — Progress Notes (Signed)
Nutrition Follow-up  DOCUMENTATION CODES:   Severe malnutrition in context of chronic illness  INTERVENTION:     D/C Vital High Protein @ 55 ml/hr (1320 ml/day)  Osmolite 1.2 @ 50 ml/hr via Cortrak MVI daily  Provides: 1440 kcal, 67 grams protein, and 973 ml free water.    NUTRITION DIAGNOSIS:   Severe Malnutrition related to chronic illness(brain tumor) as evidenced by severe fat depletion, severe muscle depletion, percent weight loss. Ongoing.   GOAL:   Patient will meet greater than or equal to 90% of their needs Met.   MONITOR:   Weight trends, TF tolerance, Labs, I & O's  ASSESSMENT:   67 y/o F who presented 3/11 with a 2 day history of worsening right sided weakness. 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 Post procedure, she failed extubation and required re-intubation. PMH of blindness, DM type 1, GERD, hyperlipidemia, schizophrenia, SAH, HTN, and Rosai Dorfman. At baseline pt dependent for all ADL's.  3/15 4/3 intubated 3/16 Cortrak  Pt with one-way extubation today. After extubation pt breathing and will transfer to 6N. Plan to resume TF for now but will d/c if pt declines per palliative notes.    Medications reviewed and include: 1-3 units novolog every 4 hrs, 10 units lantus daily, MVI, lasix 10 mg every 12 hr, miralax. 20 mEq KCl BID    Diet Order:  Fall precautions Seizure precautions Aspiration precautions Diet NPO time specified  EDUCATION NEEDS:   Not appropriate for education at this time  Skin:  Skin Assessment: Skin Integrity Issues: Skin Integrity Issues:: Stage III Stage II: progressed to stage III Stage III: sacrum Incisions: head  Last BM:  4/2  Height:   Ht Readings from Last 1 Encounters:  05/04/17 _0  (1.676 m)    Weight:   Wt Readings from Last 1 Encounters:  05/12/17 110 lb 14.3 oz (50.3 kg)    Ideal Body Weight:  59.1 kg  BMI:  Body mass  index is 17.9 kg/m.  Estimated Nutritional Needs:   Kcal:  1400-1600  Protein:  65-75 grams  Fluid:  > 1.4 L/day  Maylon Peppers RD, LDN, CNSC [phone] Pager [phone] After Hours Pager

## 2017-05-12 NOTE — Progress Notes (Signed)
Extubated pt to 2 lpm [place] per comfort measures.

## 2017-05-12 NOTE — Progress Notes (Addendum)
Palliative:  Husband, sons, and multiple family members at bedside. I have discussed with them plan for extubation today. Discussed my concern that she does not have much breathing effort on breathing trials and explained that I will give her some medication to ensure her comfort prior to extubation. They all recognize that we do not expect her to survive for long after extubation but hopeful for more time with her. Explained that we will not know for sure until she is extubated how she will do. They understand overall prognosis is very poor and all agree that she has suffered for a long time. Discussed plan with Kennieth Rad, NP PCCM and RN regarding medication and extubation. All questions/concerns addressed. Emotional support provided.   Late entry 1300: Ms. Trinkle is holding her own and breathing and vital signs stable. She is not alert but did receive some morphine to assist with extubation. She was alert at times prior to extubation. Will restart tube feeding and medications per family goals. They would like to give her the best opportunity to improve on her own and give her a chance. Spoke with husband at bedside and will continue some therapies but does no longer need ICU level care and plan to move to 6N. Transition to full comfort care with any further decline. Discussed continuing tube feedings for now and that we will continue to address her plan of care and what makes sense depending on outcomes - Johnny understands and agrees. I will continue to follow and support.   [phone] 75 min  Vinie Sill, NP  Palliative Medicine Team Pager # [phone] (M-F 8a-5p) Team Phone # [phone] (Nights/Weekends)

## 2017-05-12 NOTE — Progress Notes (Signed)
PULMONARY / CRITICAL CARE MEDICINE   Name: Rachel [name] MRN: [id number] DOB: 12-03-1950    ADMISSION DATE:  04/19/2017 CONSULTATION DATE:  04/23/17  REFERRING MD:  Dr. Trenton Gammon   CHIEF COMPLAINT:  Respiratory Insufficiency   HISTORY OF PRESENT ILLNESS:  67 y/o F who presented 3/11 with a 2 day history of worsening right sided weakness.    At baseline, she is dependent for all ADL's. She required assistance to walk, has to be fed.  She has a history of Rosai Dorfman and had previously undergone an occipital craniotomy for removal of lesions in the past.  She recently had a stable appearance of meningeal lesions in the left middle cranial fossa.  She has a hx of schizophrenia and OSA but not on CPAP after at trial with no improvement.    Initial ER evaluation included a CT of the head which showed extensive left sided vasogenic edema involving the external capsule extending to the temporal lobe, parietal lobe with associated mass-effect with compression of the left parietal ventricle, 3 mm left to right midline shift. She was treated with IV decadron and evaluated by NSGY. 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 Post procedure, she failed extubation and required re-intubation.  She was returned to ICU on mechanical ventilation.      SUBJECTIVE:   Family met with palliative care 4/2.  One way extubation planned for 10am today. No acute events overnight Apneic on SBT this am   VITAL SIGNS: BP (!) 134/55   Pulse (!) 51   Temp 97.6 F (36.4 C) (Axillary)   Resp 14   Ht _0  (1.676 m)   Wt 110 lb 14.3 oz (50.3 kg)   SpO2 100%   BMI 17.90 kg/m   HEMODYNAMICS:    VENTILATOR SETTINGS: Vent Mode: PRVC FiO2 (%):  [30 %] 30 % Set Rate:  [14 bmp] 14 bmp Vt Set:  [480 mL] 480 mL PEEP:  [5 cmH20] 5 cmH20 Plateau Pressure:  [12 cmH20-21 cmH20] 21 cmH20  INTAKE / OUTPUT: I/O last 3 completed shifts: In: 1940.8  [NG/GT:1740.8; IV Piggyback:200] Out: 2100 [Urine:2100]  PHYSICAL EXAMINATION:  General:  Critically ill female lying in bed in NAD on MV HEENT: craniotomy site clean/dry, MM pink/moist, pupils 4/reactive, ETT/OGT Neuro: Eyes open, no tracking, does not follow commands, withdrawals to pain in all extremities  CV: s1s2 rrr PULM: even/non-labored, lungs bilaterally coarse MP:NTIR, non-tender, bs active  Extremities: warm/dry,no edema  Skin: no rashes  LABS:  BMET Recent Labs  Lab 05/10/17 1947 05/11/17 0546 05/12/17 0354  NA 139 140 138  K 4.8 4.4 3.9  CL 105 104 106  CO2 _1 BUN 23* 22* 23*  CREATININE 0.37* 0.38* 0.40*  GLUCOSE 168* 113* 148*    Electrolytes Recent Labs  Lab 05/08/17 0809  05/10/17 1947 05/11/17 0546 05/12/17 0354  CALCIUM 10.4*   < > 10.2 10.8* 10.4*  MG 1.9  --   --  2.1 2.2  PHOS  --   --   --  2.3* 2.5   < > = values in this interval not displayed.    CBC Recent Labs  Lab 05/10/17 0309 05/11/17 0546 05/12/17 0354  WBC 7.2 7.2 7.2  HGB 9.6* 10.8* 10.2*  HCT 28.6* 33.9* 31.0*  PLT 120* 133* 136*    Coag's No results for input(s): APTT, INR in the last 168 hours.  Sepsis  Markers No results for input(s): LATICACIDVEN, PROCALCITON, O2SATVEN in the last 168 hours.  ABG Recent Labs  Lab 05/07/17 0335 05/08/17 1119  PHART 7.486* 7.488*  PCO2ART 42.1 41.2  PO2ART 141* 86.0    Liver Enzymes No results for input(s): AST, ALT, ALKPHOS, BILITOT, ALBUMIN in the last 168 hours.  Cardiac Enzymes No results for input(s): TROPONINI, PROBNP in the last 168 hours.  Glucose Recent Labs  Lab 05/11/17 1204 05/11/17 1608 05/11/17 2012 05/11/17 2351 05/12/17 0344 05/12/17 0411  GLUCAP 188* 180* 176* 135* 54* 101*    Imaging No results found.   STUDIES:  CT Head 3/11 >> Extensive left-sided vasogenic edema involving external capsule, left-sided centrum semi ovale, extending into the temporal lobe and parietal lobe. There  is associated mass effect with compression of the left lateral ventricle and 3 mm of left-to-right midline shift at the foramen of [place]ro. Indistinctness of the left-sided sulci may be secondary to mass effect, however, new metastatic disease or manifestation of the patient's known histiocytosis may be the underlying etiology EEG 3/20 > focal slowing over left hemisphere and mild generalized background slowing, c/w focal cerebral disturbance on left with superimposed encephalopathy pattern. MRI brain 3/20 >> Multiple punctate foci of acute ischemia within both parietal lobes, likely embolic infarcts.  Persistent subacute blood products overlying the left temporal lobe resection site with unchanged vasogenic edema in the left temporal and parietal white matter and along the left external capsule. The diffusion abnormality along the periphery of the left temporal lobe is likely artifactual secondary to susceptibility effects from the blood products there.  No residual contrast enhancing tumor.  Unchanged mass effect with 4 mm rightward midline shift  CULTURES: 3/12 MRSA PCR >> neg  ANTIBIOTICS: Cefazolin 3/15 x 1  SIGNIFICANT EVENTS: 3/11 Admit with progressive weakness, edema on CT head  3/15 Crani for resection of tumor, failed extubation   LINES/TUBES: ETT 3/15 >>   DISCUSSION: 66 y/o F admitted 3/11 with increasing weakness.  Known hx of Rosai Dorfman with previously stable CT head.  On admit found to have new edema with midline shift.  To OR 3/15 for left craniotomy for resection of tumor.  Mental status remains poor postoperatively precluding extubation and she has failed a spontaneous breathing trial repeatedly.       ASSESSMENT / PLAN:  PULMONARY A: Acute Respiratory Insufficiency - failed extubation post neurosurgery procedure.  Re-intubated and since then has failed weaning on multiple occasions. OSA - not on CPAP.  4th Rib Fracture - noted on CXR . P:   Continue full MV support  until family ready for one way extubation planned this am  CARDIOVASCULAR A:  DNR Hx PAF, HTN, HLD. BP well controlled on current regimen. Bradycardia - asymptomatic. Volume overload. P:  monitor  RENAL A:   Hypokalemia - resolved. Anion gap metabolic acidosis - resolved. Volume overload - 3L positive for admission. P: Lasix '10mg'$  BID with K supplementation. Minimize volume. Trend BMET daily.  GASTROINTESTINAL A:   At risk Malnutrition . SUP. P:   Continue tube feeding for now Protonix for GI prophylaxis.  HEMATOLOGIC A:   VTE prophylaxis. P:  SCD's. Follow CBC.  INFECTIOUS A:   No indication of infection. P:   Monitor clinically.  ENDOCRINE A:   DM. P:    Continue SSI, lantus.  NEUROLOGIC A:   Left Craniotomy s/p Tumor Removal . Cerebral Edema with Midline Shift. Rosai Dorfman Syndrome.  Deconditioning. Depression. P: Neurosurgery following.   Continue keppra.  Would favor d/c paroxetine, risperidone; however, husband adamant that "she won't wake up until she gets her depression meds".  Therefore, will continue.  UPDATES:  Dr. Pearline Cables spoke with her husband on 3/27 , he stated  that with the past surgeries she has been very slow to emerge and that she really did not turn around from a mental status standpoint until her antipsychotics were reintroduced.  In light of the fact she is now opening her eyes  he would like [place] to push forward for several more days before making any decision about tracheostomy or level of care.  Husband again updated 4/1 and explained that if he wanted to pursue aggressive care, she would need tracheostomy given that it has been over 2 weeks since initial intubation.  He stated he is leaning towards "no more cutting on her".  He wants to wait 1 - 2 days before making further decisions.   Palliative care met with husband 4/2 with plans for one-way extubation planned today, 4/3.  Given that she is apneic on her SBT's, she will  likely need transition to full comfort care if she fails extubation.  CC time: 30 min  Kennieth Rad, AGACNP-BC [facility] Pgr: [phone] or if no answer [phone] 05/12/2017, 7:58 AM

## 2017-05-13 DIAGNOSIS — E43 Unspecified severe protein-calorie malnutrition: Secondary | ICD-10-CM

## 2017-05-13 LAB — GLUCOSE, CAPILLARY
GLUCOSE-CAPILLARY: 107 mg/dL — AB (ref 65–99)
Glucose-Capillary: 240 mg/dL — ABNORMAL HIGH (ref 65–99)
Glucose-Capillary: 206 mg/dL — ABNORMAL HIGH (ref 65–99)
Glucose-Capillary: 181 mg/dL — ABNORMAL HIGH (ref 65–99)
Glucose-Capillary: 212 mg/dL — ABNORMAL HIGH (ref 65–99)

## 2017-05-13 LAB — CALCIUM: Calcium: 10.5 mg/dL — ABNORMAL HIGH (ref 8.9–10.3)

## 2017-05-13 MED ORDER — FUROSEMIDE 10 MG/ML IJ SOLN
10.0000 mg | Freq: Two times a day (BID) | INTRAMUSCULAR | Status: DC
  Administered 2017-05-13 – 2017-05-21 (×15): 10 mg via INTRAVENOUS
  Filled 2017-05-13 (×16): qty 2

## 2017-05-13 MED ORDER — POLYETHYLENE GLYCOL 3350 17 G PO PACK: 17.0000 g | PACK | Freq: Every day | ORAL | Status: DC

## 2017-05-13 MED ORDER — TRAZODONE HCL 50 MG PO TABS
50.0000 mg | ORAL_TABLET | Freq: Every evening | ORAL | Status: DC | PRN
  Filled 2017-05-13: qty 1

## 2017-05-13 MED ORDER — POLYETHYLENE GLYCOL 3350 17 G PO PACK
17.0000 g | PACK | Freq: Every day | ORAL | Status: DC
  Administered 2017-05-13 – 2017-05-19 (×7): 17 g
  Filled 2017-05-13 (×7): qty 1

## 2017-05-13 MED ORDER — POTASSIUM CHLORIDE 20 MEQ/15ML (10%) PO SOLN
20.0000 meq | Freq: Two times a day (BID) | ORAL | Status: DC
  Administered 2017-05-13 – 2017-05-21 (×15): 20 meq
  Filled 2017-05-13 (×16): qty 15

## 2017-05-13 MED ORDER — LEVETIRACETAM IN NACL 500 MG/100ML IV SOLN
500.0000 mg | Freq: Two times a day (BID) | INTRAVENOUS | Status: DC
  Administered 2017-05-13 – 2017-05-21 (×14): 500 mg via INTRAVENOUS
  Filled 2017-05-13 (×21): qty 100

## 2017-05-13 NOTE — Progress Notes (Signed)
PROGRESS NOTE  Rachel [name] ZOX:[id number] DOB: Mar 05, 1950 DOA: 04/19/2017 PCP: Iona Beard, MD  HPI/Recap of past 24 hours:  Rachel [name] is a 67 y.o. 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    Extra-axial masses consistent with meningioma vs lymphoproliferative tissue associated with her Droman disease. Initially attempted stabilization with IV decadron then underwent elective left craniotomy for resection of tumors with microdissection on 3/15 by NSGY. She failed post procedure extubation due to increased somnolence and inability to protect airway.  Keppra dosing was increased due to persistent poor responsiveness while intubated though EEG showed no seizure activity. Repeat CT imaging showed no acute changes with slight improvement in left cerebral vasogenic edema and similar left to right midline shift. Neurology recommended continuing AEDs and decadron with concern swelling was likely culprit of her encephalopathy.  MRI Brain on 3/20 consistent with scattered embolic strokes of right hemisphere, neurology believed these findings were incidental and not contributing to her poor mentation. Given her poor baseline prior to admission (dependent in ADLs and IADLs) believed this would be a very slow process for potential improvement and recommended palliative's involvement.    4/3 after husband spoke with palliative care team decided for one extubation with plan for DNR and no reintubation with plan to transition to comfort care if she was unable to breathe on her own.  She was extubated to 2 L [place] with no complications.   Family asked for restarting tube feeding and medications to given her a chance to see if she could recover further prior to transfer to [facility] for further management.   Subjective No family at bedside. Patient does not respond to voice or touch.   Assessment/Plan: Active Problems:   HTN (hypertension)   PAF (paroxysmal atrial fibrillation) (HCC)   Rosai-Dorfman disease (HCC)   Diabetes mellitus type 2 in nonobese (HCC)   Acute encephalopathy   Hypokalemia   Brain edema (HCC)   Neoplasm of brain causing mass effect on adjacent structures (HCC)   Hypercalcemia   Pressure injury of skin   Protein-calorie malnutrition, severe   Failure to wean from mechanical ventilation (HCC)   Goals of care, counseling/discussion   Palliative care encounter   #Enlarging left sided extradural masses with vasogenic edema. S/p tumors resection/craniectomy on 3/15. Unchanged mass effect and edema on MRI from 3/20.Presumed tumors Attributed to Rosai-Dorfman disease. Continue IV keppra  #Encephalopathy, most likely related to vasogenic edema and midline shift due to above, not improving. Subclinical seizures, withdrawal from antipsychotics, and infection ruled out on admission. Patient remains essentially non-responsive. Appreciate of palliative medicine working with family for continued goals of care discussions. PRN IV morphine for severe pain, Iv antiemetics  #Paroxysmal Atrial Fibrillation, currently rate controlled- unable to tolerate PO given encephalopathy so elevated HR during hospitalization thus fa controlled with IV metoprolol PRN, holding home diltiazem  #Acute respiratory failure related to encephalopathy due to above, improved- required intubation shortly after craniectomy on 3/15. One way extubation on 4/3 to 2 L [place]. Currently on room air.   #Schizophrenia/anxiety/depression- home trazodone, risperidone and paxil via Dobhovt, PRN IV ativan to prevent seizure withdrawal in setting of  chronic  benzodiazepine since day of admission  #T2DM- A1c 7.4%(04/2017), lantus 10 U, sliding scale  # Hypercalcemia, improving. Peak of 11.8 during admission, last 10.4, will reasses. Has been treated with IVF throughout hospitalization  Code Status: DNR  Family Communication: No family at bedside, will call to update  Disposition Plan: monitoring to see if any clinical response, palliative care following and possible family may transition to comfort care   Consultants:  PCCM, [place], Neurology, Palliative  Procedures:  Craniectomy 3/15   EEG 3/19  TTE 3/21, EF 65-70%, moderate LVH  Antimicrobials:  none  Cultures:  none  Telemetry:yes  DVT prophylaxis: SCDs  Objective: Vitals:   05/12/17 1726 05/13/17 0500 05/13/17 0548 05/13/17 1353  BP: (!) 154/82  137/70 131/68  Pulse: 60  (!) 58 (!) 56  Resp: 12  16 16   Temp: 97.7 F (36.5 C)  97.6 F (36.4 C) (!) 96.8 F (36 C)  TempSrc: Axillary  Axillary Axillary  SpO2: 100%  97% 98%  Weight:  50.7 kg (111 lb 12.4 oz)    Height:        Intake/Output Summary (Last 24 hours) at 05/13/2017 1655 Last data filed at 05/13/2017 1300 Gross per 24 hour  Intake 591.67 ml  Output 275 ml  Net 316.67 ml   Filed Weights   05/11/17 0500 05/12/17 0500 05/13/17 0500  Weight: 53.6 kg (118 lb 2.7 oz) 50.3 kg (110 lb 14.3 oz) 50.7 kg (111 lb 12.4 oz)    Exam:  Constitutional:cachetic, chronically ill appearing female Eyes: eyes closed Cardiovascular: RRR no MRGs, with no peripheral edema Respiratory: Normal respiratory effort on room air, clear breath sounds  Abdomen: Soft,non-tender, with no appreciableHSM Skin: No rash ulcers, or lesions. Without skin tenting  Neurologic: not responsive to voice or touch   Data Reviewed: CBC: Recent Labs  Lab 05/07/17 0344 05/08/17 0809 05/10/17 0309 05/11/17 0546 05/12/17 0354  WBC 11.2* 10.1 7.2 7.2 7.2  NEUTROABS 9.3*  --  4.5  --   --   HGB 9.7* 9.6* 9.6* 10.8* 10.2*  HCT 29.8*  29.5* 28.6* 33.9* 31.0*  MCV 92.0 93.1 93.8 94.4 92.5  PLT 148* 142* 120* 133* 998*   Basic Metabolic Panel: Recent Labs  Lab 05/07/17 0344 05/08/17 0809 05/09/17 0248 05/10/17 0309 05/10/17 1947 05/11/17 0546 05/12/17 0354  NA 137 140 140 137 139 140 138  K 3.6 3.7 3.7 3.2* 4.8 4.4 3.9  CL 103 107 105 102 105 104 106  CO2 29 29 29 27 28 28 24   GLUCOSE 154* 101* 126* 159* 168* 113* 148*  BUN 27* 24* 22* 22* 23* 22* 23*  CREATININE 0.36* 0.34* 0.37* 0.37* 0.37* 0.38* 0.40*  CALCIUM 10.2 10.4* 10.3 9.8 10.2 10.8* 10.4*  MG 2.0 1.9  --   --   --  2.1 2.2  PHOS  --   --   --   --   --  2.3* 2.5   GFR: Estimated Creatinine Clearance: 56.9 mL/min (A) (by C-G formula based on SCr of 0.4 mg/dL (L)). Liver Function Tests: No results for input(s): AST, ALT, ALKPHOS, BILITOT, PROT, ALBUMIN in the last 168 hours. No results for input(s): LIPASE, AMYLASE in the last 168 hours. No results for input(s): AMMONIA in the last 168 hours. Coagulation Profile: No results for input(s): INR, PROTIME in the last 168 hours. Cardiac Enzymes: No results for input(s): CKTOTAL, CKMB, CKMBINDEX, TROPONINI in the last 168 hours. BNP (last 3 results) No results for input(s): PROBNP  in the last 8760 hours. HbA1C: No results for input(s): HGBA1C in the last 72 hours. CBG: Recent Labs  Lab 05/12/17 1550 05/13/17 0346 05/13/17 0755 05/13/17 1125 05/13/17 1623  GLUCAP 89 240* 206* 181* 107*   Lipid Profile: No results for input(s): CHOL, HDL, LDLCALC, TRIG, CHOLHDL, LDLDIRECT in the last 72 hours. Thyroid Function Tests: No results for input(s): TSH, T4TOTAL, FREET4, T3FREE, THYROIDAB in the last 72 hours. Anemia Panel: No results for input(s): VITAMINB12, FOLATE, FERRITIN, TIBC, IRON, RETICCTPCT in the last 72 hours. Urine analysis:    Component Value Date/Time   COLORURINE YELLOW 04/19/2017 2252   APPEARANCEUR HAZY (A) 04/19/2017 2252   LABSPEC 1.008 04/19/2017 2252   PHURINE 7.0 04/19/2017  2252   GLUCOSEU NEGATIVE 04/19/2017 2252   HGBUR NEGATIVE 04/19/2017 2252   BILIRUBINUR NEGATIVE 04/19/2017 2252   KETONESUR 5 (A) 04/19/2017 2252   PROTEINUR NEGATIVE 04/19/2017 2252   UROBILINOGEN 0.2 12/04/2013 1518   NITRITE NEGATIVE 04/19/2017 2252   LEUKOCYTESUR NEGATIVE 04/19/2017 2252   Sepsis Labs: @LABRCNTIP (procalcitonin:4,lacticidven:4)  )No results found for this or any previous visit (from the past 240 hour(s)).    Studies: No results found.  Scheduled Meds: . chlorhexidine gluconate (MEDLINE KIT)  15 mL Mouth Rinse BID  . furosemide  10 mg Intravenous Q12H  . heparin injection (subcutaneous)  5,000 Units Subcutaneous Q12H  . insulin aspart  1-3 Units Subcutaneous Q4H  . insulin glargine  10 Units Subcutaneous QHS  . multivitamin  15 mL Per Tube Daily  . pantoprazole sodium  40 mg Per Tube Daily  . PARoxetine  40 mg Per Tube Daily  . polyethylene glycol  17 g Per Tube Daily  . potassium chloride  20 mEq Per Tube Q12H  . risperiDONE  2 mg Per Tube QHS    Continuous Infusions: . feeding supplement (OSMOLITE 1.2 CAL) 1,000 mL (05/13/17 1647)  . levETIRAcetam 500 mg (05/13/17 1649)     LOS: 24 days     Desiree Hane, MD [facility] Pager [phone]  If 7PM-7AM, please contact night-coverage [email or web address] Password [email or web address] 05/13/2017, 4:55 PM

## 2017-05-13 NOTE — Progress Notes (Addendum)
Palliative:  Rachel [name] continues to be unresponsive. Husband states that she nodded her head no to him last night. Myself or any of the nurses have not witnessed any meaningful response during or after intubation. On the vent she would open eyes but did not focus, track, or open them to command or follow any other simple commands. [name] has had very poor QOL even prior to admission.   I did share with husband, Rachel [name], that I am concerned with her decreased alertness. Explained that even with surgery there is likely damage done to her brain from severe edema. I explained that if she does not awaken even into tomorrow we will need to consider the benefit of current interventions such as tube feeding. He feels that she just needs more time and wishes to take one day at a time. Will need continued daily follow up and likely transition to full comfort care in the next few days. However, family was prepared that she was likely to pass quickly after extubation and now that breathing is more stable they have a little more hope. Conversations will need to be gently continued with Rachel [name].   15 min  Vinie Sill, NP Palliative Medicine Team Pager # [phone] (M-F 8a-5p) Team Phone # [phone] (Nights/Weekends)

## 2017-05-14 DIAGNOSIS — Z515 Encounter for palliative care: Secondary | ICD-10-CM

## 2017-05-14 LAB — GLUCOSE, CAPILLARY
GLUCOSE-CAPILLARY: 118 mg/dL — AB (ref 65–99)
GLUCOSE-CAPILLARY: 131 mg/dL — AB (ref 65–99)
GLUCOSE-CAPILLARY: 179 mg/dL — AB (ref 65–99)
GLUCOSE-CAPILLARY: 219 mg/dL — AB (ref 65–99)
GLUCOSE-CAPILLARY: 228 mg/dL — AB (ref 65–99)
Glucose-Capillary: 201 mg/dL — ABNORMAL HIGH (ref 65–99)

## 2017-05-14 MED ORDER — SODIUM CHLORIDE 0.9 % IV SOLN
INTRAVENOUS | Status: AC
  Administered 2017-05-14: 18:00:00 via INTRAVENOUS

## 2017-05-14 NOTE — Progress Notes (Signed)
Daily Progress Note   Patient Name: Rachel [name]       Date: 05/14/2017 DOB: 1950/05/02  Age: 67 y.o. MRN#: [id number] Attending Physician: Desiree Hane, MD Primary Care Physician: Iona Beard, MD Admit Date: 04/19/2017  Reason for Consultation/Follow-up: Establishing goals of care and Psychosocial/spiritual support  Subjective: Patient seen, chart reviewed.  Patient underwent a one-way extubation and is now breathing on her own.  Her husband Rachel [name], is at the bedside.  He states "she is getting better".  When I asked him about what he was seen that was showing him improvement, he said today she is trying to talk to me.  He is still maintaining hope that she will be able to come back home, "once we get her back on her feet".  Being able to walk even with short distances would be the baseline needed for her to return home.  Apparently, she has been in a rehab center before and husband states "that didn't go well".  Length of Stay: 25  Current Medications: Scheduled Meds:  . chlorhexidine gluconate (MEDLINE KIT)  15 mL Mouth Rinse BID  . furosemide  10 mg Intravenous Q12H  . heparin injection (subcutaneous)  5,000 Units Subcutaneous Q12H  . insulin aspart  1-3 Units Subcutaneous Q4H  . insulin glargine  10 Units Subcutaneous QHS  . multivitamin  15 mL Per Tube Daily  . pantoprazole sodium  40 mg Per Tube Daily  . PARoxetine  40 mg Per Tube Daily  . polyethylene glycol  17 g Per Tube Daily  . potassium chloride  20 mEq Per Tube Q12H  . risperiDONE  2 mg Per Tube QHS    Continuous Infusions: . feeding supplement (OSMOLITE 1.2 CAL) 1,000 mL (05/14/17 1238)  . levETIRAcetam Stopped (05/14/17 0553)    PRN Meds: albuterol, docusate, hydrALAZINE, LORazepam, morphine injection,  ondansetron **OR** ondansetron (ZOFRAN) IV, traZODone  Physical Exam  Constitutional:  Cachectic, older female; appears acutely ill, minimally responsive.  She is only able to speak in a whisper  HENT:  Temporal wasting  Neck: Normal range of motion.  Pulmonary/Chest:  Weak respiratory effort  Neurological:  Somnolent but awakens to voice and attempts to answer questions  Skin: Skin is warm and dry.  Psychiatric:  Patient is withdrawn Short-term memory deficits Is able to answer some  brief simple questions.  Was able to tell me she was not in pain  Nursing note and vitals reviewed.           Vital Signs: BP 138/76 (BP Location: Right Arm)   Pulse (!) 56   Temp 97.9 F (36.6 C) (Oral)   Resp 14   Ht 5' 6"  (1.676 m)   Wt 49.4 kg (108 lb 14.5 oz)   SpO2 100%   BMI 17.58 kg/m  SpO2: SpO2: 100 % O2 Device: O2 Device: Room Air O2 Flow Rate: O2 Flow Rate (L/min): 30 L/min  Intake/output summary:   Intake/Output Summary (Last 24 hours) at 05/14/2017 1258 Last data filed at 05/14/2017 1030 Gross per 24 hour  Intake 0 ml  Output 1300 ml  Net -1300 ml   LBM: Last BM Date: 05/11/17 Baseline Weight: Weight: 59 kg (130 lb) Most recent weight: Weight: 49.4 kg (108 lb 14.5 oz)       Palliative Assessment/Data:    Flowsheet Rows     Most Recent Value  Intake Tab  Referral Department  Surgery  Palliative Care Primary Diagnosis  Neurology  Date Notified  05/10/17  Palliative Care Type  Return patient Palliative Care  Reason for referral  Clarify Goals of Care  Date of Admission  04/19/17  Date first seen by Palliative Care  05/11/17  # of days Palliative referral response time  1 Day(s)  # of days IP prior to Palliative referral  21  Clinical Assessment  Psychosocial & Spiritual Assessment  Palliative Care Outcomes      Patient Active Problem List   Diagnosis Date Noted  . Failure to wean from mechanical ventilation ([place])   . Goals of care, counseling/discussion   .  Palliative care encounter   . Pressure injury of skin 04/23/2017  . Protein-calorie malnutrition, severe 04/23/2017  . Hypercalcemia 04/20/2017  . Hypokalemia 04/19/2017  . Brain edema ([place]) 04/19/2017  . Neoplasm of brain causing mass effect on adjacent structures ([place]) 04/19/2017  . Fatigue 06/16/2016  . Somnolence, daytime 06/16/2016  . Rosai-Dorfman disease ([place])   . Blind   . Schizophrenia ([place])   . Diabetes mellitus type 2 in nonobese (HCC)   . Acute encephalopathy   . Brain mass   . Subarachnoid hemorrhage following injury, no loss of consciousness ([place]) 09/18/2015  . SAH (subarachnoid hemorrhage) ([place]) 09/17/2015  . Facial droop 12/04/2013  . 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 Blind in both eyes 05/31/2013  . HTN (hypertension) 10/11/2012  . PAF (paroxysmal atrial fibrillation) ([place]) 10/11/2012  . Dyslipidemia 10/11/2012  . H/O brain tumor 10/11/2012  . Insomnia due to mental disorder 12/29/2011  . Libido, decreased 12/29/2011  . Depression 04/14/2011    Palliative Care Assessment & Plan   Patient Profile: Rachel [name] is a 67 y.o. 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    Consult ordered for goals of care  Patient is now out of ICU and has undergone a compassionate wean from the ventilator.  She is breathing on her own; no tachypnea.  Today she is attempting to speak   Assessment: Patient remains critically ill and very weak.  Husband has requested that tube feeding to be  resumed, and a running at 50 mL's per hour.  Patient denies any nausea and there is no upper airway secretions noted.  She is  also denying pain.  As she is more alert and now is attempting to speak, her husband Rachel [name], is perceiving her is improving.   Recommendations/Plan:  This is my first meeting with patient as well as patient's husband.  I introduced myself as part of the palliative medicine team and as an additional resource and source of support not only for Ms. Ventrella but also for her husband Rachel [name].  We will continue to round on patient through the weekend and hopefully establish rapport with patient's husband and patient in order to assist them with further goals of care  As for now, limitations of care are DNR without reintubation have been set.  I generally asked patient's husband whether he and his wife had ever talked about advanced care measures specifically artificial feeding, PEG tubes.  He replied no, that he was not familiar with what that was.  Palliative medicine to help facilitate those conversations  Reviewed patient's medications with husband.  He was concerned about patient's psychiatric medicines being resumed  Patient's husband will need a great deal of support as well as medical information reinforcement as we see how patient is going to do clinically.  Goals of Care and Additional Recommendations:  Limitations on Scope of Treatment: Full Scope Treatment exept for DNR/DNI  Code Status:    Code Status Orders  (From admission, onward)        Start     Ordered   05/12/17 0957  DNR (Do not attempt resuscitation)  Continuous    Question Answer Comment  In the event of cardiac or respiratory ARREST Do not call a "code blue"   In the event of cardiac or respiratory ARREST Do not perform Intubation, CPR, defibrillation or ACLS   In the event of cardiac or respiratory ARREST Use medication by any route, position, wound care, and other measures to relive pain and suffering. May use oxygen, suction and manual treatment of airway obstruction as needed for comfort.      05/12/17 0956      Code Status History    Date Active Date Inactive Code Status Order ID Comments User Context   05/11/2017 1011 05/12/2017 0956 DNR [id number]  Pershing Proud, NP Inpatient   04/19/2017 2239 05/11/2017 1011 Full Code [id number]  Toy Baker, MD ED   09/17/2015 2213 09/19/2015 1516 Full Code [id number]  Wallie Char Inpatient   08/15/2015 1546 08/18/2015 2319 Full Code [id number]  Julianne Rice, MD ED   12/04/2013 1844 12/05/2013 1730 Full Code [id number]  Doree Albee, MD ED       Prognosis:   Unable to determine  Discharge Planning:  To Be Determined  Paged Dr. Theodoro Grist  Thank you for allowing the Palliative Medicine Team to assist in the care of this patient.   Time In: 1100 Time Out: 1135 Total Time 35 min Prolonged Time Billed  no       Greater than 50%  of this time was spent counseling and coordinating care related to the above assessment and plan.  Dory Horn, NP  Please contact Palliative Medicine Team phone at [phone] for questions and concerns.

## 2017-05-14 NOTE — Progress Notes (Signed)
PROGRESS NOTE  Rachel [name] VHQ:[id number] DOB: [birth year] DOA: 04/19/2017 PCP: Iona Beard, MD  HPI/Recap of past 24 hours:  Rachel [name] is a 67 y.o. 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    Extra-axial masses consistent with meningioma vs lymphoproliferative tissue associated with her Droman disease. Initially attempted stabilization with IV decadron then underwent elective left craniotomy for resection of tumors with microdissection on 3/15 by NSGY. She failed post procedure extubation due to increased somnolence and inability to protect airway.  Keppra dosing was increased due to persistent poor responsiveness while intubated though EEG showed no seizure activity. Repeat CT imaging showed no acute changes with slight improvement in left cerebral vasogenic edema and similar left to right midline shift. Neurology recommended continuing AEDs and decadron with concern swelling was likely culprit of her encephalopathy.  MRI Brain on 3/20 consistent with scattered embolic strokes of right hemisphere, neurology believed these findings were incidental and not contributing to her poor mentation. Given her poor baseline prior to admission (dependent in ADLs and IADLs) believed this would be a very slow process for potential improvement and recommended palliative's involvement.    4/3 after husband spoke with palliative care team decided for one extubation with plan for DNR and no reintubation with plan to transition to comfort care if she was unable to breathe on her own.  She was extubated to 2 L [place] with no complications.   Family asked for restarting tube feeding and medications to given her a chance to see if she could recover further prior to transfer to [facility] for further management.   Subjective No family at bedside. Opening eyes to voice, attempting to talk to me this morning. Moves left arm and leg.   Assessment/Plan: Active Problems:   HTN (hypertension)   PAF (paroxysmal atrial fibrillation) (HCC)   Rosai-Dorfman disease (HCC)   Diabetes mellitus type 2 in nonobese (HCC)   Acute encephalopathy   Hypokalemia   Brain edema (HCC)   Neoplasm of brain causing mass effect on adjacent structures (HCC)   Hypercalcemia   Pressure injury of skin   Protein-calorie malnutrition, severe   Failure to wean from mechanical ventilation (HCC)   Goals of care, counseling/discussion   Palliative care encounter   Palliative care by specialist   #Enlarging left sided extradural masses with vasogenic edema. S/p tumors resection/craniectomy on 3/15. Unchanged mass effect and edema on MRI from 3/20.Presumed tumors attributed to Rosai-Dorfman disease. Continue IV keppra  #Encephalopathy, most likely related to vasogenic edema and midline shift due to above,  improving. Patient more interactive today in comparison to yesterday as she opens her eyes to voice and is moving her left side (arms, legs). Subclinical seizures, withdrawal from antipsychotics, and infection ruled out on admission. Appreciate of palliative medicine working with husband continued goals of care discussions. PRN IV morphine for severe pain, Iv antiemetics  #Paroxysmal Atrial Fibrillation, currently rate controlled- unable to tolerate PO given encephalopathy so elevated HR during hospitalization thus far controlled with IV metoprolol PRN, holding home diltiazem  #Acute respiratory failure related to encephalopathy due to above,resoled. Now on room air after one way extubation on  4/3. Previously required intubation shortly after craniectomy on 3/15.    #Schizophrenia/anxiety/depression- home trazodone, risperidone and paxil via Dobhovt, PRN IV ativan to prevent seizure withdrawal in setting of chronic benzodiazepine since day of admission  #T2DM- A1c 7.4%(04/2017), lantus 10 U, sliding scale  # Hypercalcemia, improving. Peak of 11.8 during admission, stable at 10.5. Give timed IVF x 24 hours and reassess in am.  Code Status: DNR  Family Communication: No family at bedside this morning, Husband did not answer cell phone, left message will attempt again tomorrow  Disposition Plan:  Plan to speak to husband to ensure understand severity of condition, palliative care following to assist with goals of care discussion.    Consultants:  PCCM, [place], Neurology, Palliative  Procedures:  Craniectomy 3/15   EEG 3/19  TTE 3/21, EF 65-70%, moderate LVH  Antimicrobials:  none  Cultures:  none  Telemetry:yes  DVT prophylaxis: SCDs  Objective: Vitals:   05/13/17 0548 05/13/17 1353 05/14/17 0512 05/14/17 1507  BP: 137/70 131/68 138/76 128/81  Pulse: (!) 58 (!) 56 (!) 56 (!) 59  Resp: 16 16 14    Temp: 97.6 F (36.4 C) (!) 96.8 F (36 C) 97.9 F (36.6 C) 97.8 F (36.6 C)  TempSrc: Axillary Axillary Oral Oral  SpO2: 97% 98% 100%   Weight:   49.4 kg (108 lb 14.5 oz)   Height:        Intake/Output Summary (Last 24 hours) at 05/14/2017 1730 Last data filed at 05/14/2017 1030 Gross per 24 hour  Intake -  Output 1025 ml  Net -1025 ml   Filed Weights   05/12/17 0500 05/13/17 0500 05/14/17 0512  Weight: 50.3 kg (110 lb 14.3 oz) 50.7 kg (111 lb 12.4 oz) 49.4 kg (108 lb 14.5 oz)    Exam:  Constitutional:cachetic, chronically ill appearing female Eyes: opens eyes to voice Cardiovascular: RRR no MRGs, with no peripheral edema Respiratory: Normal respiratory effort on room air, clear breath sounds  Abdomen: Soft,non-tender Skin: No rash ulcers, or lesions. Without skin tenting  Neurologic: opens eyes to voice, moves left arm  and leg on own, attempted to talk to me during exam   Data Reviewed: CBC: Recent Labs  Lab 05/08/17 0809 05/10/17 0309 05/11/17 0546 05/12/17 0354  WBC 10.1 7.2 7.2 7.2  NEUTROABS  --  4.5  --   --   HGB 9.6* 9.6* 10.8* 10.2*  HCT 29.5* 28.6* 33.9* 31.0*  MCV 93.1 93.8 94.4 92.5  PLT 142* 120* 133* 563*   Basic Metabolic Panel: Recent Labs  Lab 05/08/17 0809 05/09/17 0248 05/10/17 0309 05/10/17 1947 05/11/17 0546 05/12/17 0354 05/13/17 1732  NA 140 140 137 139 140 138  --   K 3.7 3.7 3.2* 4.8 4.4 3.9  --   CL 107 105 102 105 104 106  --   CO2 29 29 27 28 28 24   --   GLUCOSE 101* 126* 159* 168* 113* 148*  --   BUN 24* 22* 22* 23* 22* 23*  --   CREATININE 0.34* 0.37* 0.37* 0.37* 0.38* 0.40*  --   CALCIUM 10.4* 10.3 9.8 10.2 10.8* 10.4* 10.5*  MG 1.9  --   --   --  2.1 2.2  --   PHOS  --   --   --   --  2.3* 2.5  --    GFR: Estimated Creatinine Clearance: 55.4 mL/min (A) (by C-G formula based on SCr of 0.4 mg/dL (L)). Liver Function Tests: No results for input(s): AST,  ALT, ALKPHOS, BILITOT, PROT, ALBUMIN in the last 168 hours. No results for input(s): LIPASE, AMYLASE in the last 168 hours. No results for input(s): AMMONIA in the last 168 hours. Coagulation Profile: No results for input(s): INR, PROTIME in the last 168 hours. Cardiac Enzymes: No results for input(s): CKTOTAL, CKMB, CKMBINDEX, TROPONINI in the last 168 hours. BNP (last 3 results) No results for input(s): PROBNP in the last 8760 hours. HbA1C: No results for input(s): HGBA1C in the last 72 hours. CBG: Recent Labs  Lab 05/14/17 0028 05/14/17 0449 05/14/17 0755 05/14/17 1221 05/14/17 1557  GLUCAP 219* 131* 201* 118* 179*   Lipid Profile: No results for input(s): CHOL, HDL, LDLCALC, TRIG, CHOLHDL, LDLDIRECT in the last 72 hours. Thyroid Function Tests: No results for input(s): TSH, T4TOTAL, FREET4, T3FREE, THYROIDAB in the last 72 hours. Anemia Panel: No results for input(s): VITAMINB12,  FOLATE, FERRITIN, TIBC, IRON, RETICCTPCT in the last 72 hours. Urine analysis:    Component Value Date/Time   COLORURINE YELLOW 04/19/2017 2252   APPEARANCEUR HAZY (A) 04/19/2017 2252   LABSPEC 1.008 04/19/2017 2252   PHURINE 7.0 04/19/2017 2252   GLUCOSEU NEGATIVE 04/19/2017 2252   HGBUR NEGATIVE 04/19/2017 2252   BILIRUBINUR NEGATIVE 04/19/2017 2252   KETONESUR 5 (A) 04/19/2017 2252   PROTEINUR NEGATIVE 04/19/2017 2252   UROBILINOGEN 0.2 12/04/2013 1518   NITRITE NEGATIVE 04/19/2017 2252   LEUKOCYTESUR NEGATIVE 04/19/2017 2252   Sepsis Labs: @LABRCNTIP (procalcitonin:4,lacticidven:4)  )No results found for this or any previous visit (from the past 240 hour(s)).    Studies: No results found.  Scheduled Meds: . chlorhexidine gluconate (MEDLINE KIT)  15 mL Mouth Rinse BID  . furosemide  10 mg Intravenous Q12H  . heparin injection (subcutaneous)  5,000 Units Subcutaneous Q12H  . insulin aspart  1-3 Units Subcutaneous Q4H  . insulin glargine  10 Units Subcutaneous QHS  . multivitamin  15 mL Per Tube Daily  . pantoprazole sodium  40 mg Per Tube Daily  . PARoxetine  40 mg Per Tube Daily  . polyethylene glycol  17 g Per Tube Daily  . potassium chloride  20 mEq Per Tube Q12H  . risperiDONE  2 mg Per Tube QHS    Continuous Infusions: . feeding supplement (OSMOLITE 1.2 CAL) 1,000 mL (05/14/17 1238)  . levETIRAcetam 500 mg (05/14/17 1643)     LOS: 25 days     Desiree Hane, MD [facility] Pager [phone]  If 7PM-7AM, please contact night-coverage [email or web address] Password [email or web address] 05/14/2017, 5:30 PM

## 2017-05-15 LAB — GLUCOSE, CAPILLARY
GLUCOSE-CAPILLARY: 104 mg/dL — AB (ref 65–99)
GLUCOSE-CAPILLARY: 144 mg/dL — AB (ref 65–99)
Glucose-Capillary: 127 mg/dL — ABNORMAL HIGH (ref 65–99)
Glucose-Capillary: 129 mg/dL — ABNORMAL HIGH (ref 65–99)
Glucose-Capillary: 164 mg/dL — ABNORMAL HIGH (ref 65–99)
Glucose-Capillary: 176 mg/dL — ABNORMAL HIGH (ref 65–99)
Glucose-Capillary: 213 mg/dL — ABNORMAL HIGH (ref 65–99)

## 2017-05-15 NOTE — Progress Notes (Signed)
PROGRESS NOTE  Rachel [name] LKG:[id number] DOB: [birth year] DOA: 04/19/2017 PCP: Iona Beard, MD  HPI/Recap of past 24 hours:  Rachel [name] is a 67 y.o. 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    Extra-axial masses consistent with meningioma vs lymphoproliferative tissue associated with her Droman disease. Initially attempted stabilization with IV decadron then underwent elective left craniotomy for resection of tumors with microdissection on 3/15 by NSGY. She failed post procedure extubation due to increased somnolence and inability to protect airway.  Keppra dosing was increased due to persistent poor responsiveness while intubated though EEG showed no seizure activity. Repeat CT imaging showed no acute changes with slight improvement in left cerebral vasogenic edema and similar left to right midline shift. Neurology recommended continuing AEDs and decadron with concern swelling was likely culprit of her encephalopathy.  MRI Brain on 3/20 consistent with scattered embolic strokes of right hemisphere, neurology believed these findings were incidental and not contributing to her poor mentation. Given her poor baseline prior to admission (dependent in ADLs and IADLs) believed this would be a very slow process for potential improvement and recommended palliative's involvement.    4/3 after husband spoke with palliative care team decided for one extubation with plan for DNR and no reintubation with plan to transition to comfort care if she was unable to breathe on her own.  She was extubated to 2 L [place] with no complications.   Family asked for restarting tube feeding and medications to given her a chance to see if she could recover further prior to transfer to [facility] for further management.   Subjective Husband at bedside.  Patient opening eyes to voice trying to talk to me.  Assessment/Plan: Active Problems:   HTN (hypertension)   PAF (paroxysmal atrial fibrillation) (HCC)   Rosai-Dorfman disease (HCC)   Diabetes mellitus type 2 in nonobese (HCC)   Acute encephalopathy   Hypokalemia   Brain edema (HCC)   Neoplasm of brain causing mass effect on adjacent structures (HCC)   Hypercalcemia   Pressure injury of skin   Protein-calorie malnutrition, severe   Failure to wean from mechanical ventilation (HCC)   Goals of care, counseling/discussion   Palliative care encounter   Palliative care by specialist   #Enlarging left sided extradural masses with vasogenic edema. S/p tumors resection/craniectomy on 3/15. Unchanged mass effect and edema on MRI from 3/20.Presumed tumors attributed to Rosai-Dorfman disease. Continue IV keppra  #Encephalopathy, stable most likely multifactorial etiology: Related to vasogenic edema and midline shift due to above,  Still opening eyes to voice and attempting to talk, no changes otherwise.  Patient prognosis seems poor myself and palliative medicine team about initiating goals of care discussion with husband maintains a positive outlook.  Patient is displaying some increased signs of agitation on exam we will continue Ativan 0.5 mg every 4 as needed. PRN IV morphine for severe pain, Iv antiemetics  #Paroxysmal Atrial Fibrillation, currently rate controlled- unable to tolerate PO given encephalopathy, the last 40 hours has not required any IV metoprolol as has remained rate controlled on room, will continue to hold home diltiazem   #Acute respiratory failure related to  encephalopathy due to above,resolved. Now on room air after one way extubation on 4/3. Previously required intubation  shortly after craniectomy on 3/15.   #Schizophrenia/anxiety/depression- home trazodone, risperidone and paxil via Dobhovt, PRN IV ativan to prevent seizure withdrawal in setting of chronic benzodiazepine since day of admission  #T2DM- A1c 7.4%(04/2017), lantus 10 U, sliding scale insulin  # Hypercalcemia, improving. Peak of 11.8 during admission, previously 10.5 s/p timed IVF on 4/5. Will reasses in am  Code Status: DNR  Family Communication: Husband and friend at bedside  Disposition Plan:  Continue goals of care discussion with husband, on tube fees, monitoring agitation with IV ativan, still on IV keppra for seizure prophylaxis will need to deescalate to oral. Currently has NGT would have to consider PEG tube if patient's mental status doesn't improve enough to maintain PO intake, palliative medicine has broached this topic with husband, we will continue our efforts   Consultants:  PCCM, [place], Neurology, Palliative  Procedures:  Craniectomy 3/15   EEG 3/19  TTE 3/21, EF 65-70%, moderate LVH  Antimicrobials:  none  Cultures:  none  Telemetry:yes  DVT prophylaxis: SCDs  Objective: Vitals:   05/14/17 0512 05/14/17 1507 05/15/17 0550 05/15/17 0905  BP: 138/76 128/81 (!) 156/64 137/67  Pulse: (!) 56 (!) 59 66 99  Resp: _0 Temp: 97.9 F (36.6 C) 97.8 F (36.6 C) 98.3 F (36.8 C) 97.7 F (36.5 C)  TempSrc: Oral Oral Axillary Axillary  SpO2: 100%  100% 100%  Weight: 49.4 kg (108 lb 14.5 oz)     Height:        Intake/Output Summary (Last 24 hours) at 05/15/2017 1115 Last data filed at 05/15/2017 0700 Gross per 24 hour  Intake 2850 ml  Output -  Net 2850 ml   Filed Weights   05/12/17 0500 05/13/17 0500 05/14/17 0512  Weight: 50.3 kg (110 lb 14.3 oz) 50.7 kg (111 lb 12.4 oz) 49.4 kg (108 lb 14.5 oz)    Exam:  Constitutional:cachetic, chronically ill appearing female lying in bed Eyes: opens eyes to voice Cardiovascular: RRR no MRGs, with no  peripheral edema Respiratory: Normal respiratory effort on room air, clear breath sounds  Abdomen: Soft,non-tender Skin: No rash ulcers, or lesions. Without skin tenting  Neurologic: opens eyes to voice, moves left and right arm and leg on own, attempted to talk to me during exam   Data Reviewed: CBC: Recent Labs  Lab 05/10/17 0309 05/11/17 0546 05/12/17 0354  WBC 7.2 7.2 7.2  NEUTROABS 4.5  --   --   HGB 9.6* 10.8* 10.2*  HCT 28.6* 33.9* 31.0*  MCV 93.8 94.4 92.5  PLT 120* 133* 038*   Basic Metabolic Panel: Recent Labs  Lab 05/09/17 0248 05/10/17 0309 05/10/17 1947 05/11/17 0546 05/12/17 0354 05/13/17 1732  NA 140 137 139 140 138  --   K 3.7 3.2* 4.8 4.4 3.9  --   CL 105 102 105 104 106  --   CO2 _1 --   GLUCOSE 126* 159* 168* 113* 148*  --   BUN 22* 22* 23* 22* 23*  --   CREATININE 0.37* 0.37* 0.37* 0.38* 0.40*  --   CALCIUM 10.3 9.8 10.2 10.8* 10.4* 10.5*  MG  --   --   --  2.1 2.2  --   PHOS  --   --   --  2.3* 2.5  --    GFR: Estimated Creatinine Clearance: 55.4 mL/min (A) (by  C-G formula based on SCr of 0.4 mg/dL (L)). Liver Function Tests: No results for input(s): AST, ALT, ALKPHOS, BILITOT, PROT, ALBUMIN in the last 168 hours. No results for input(s): LIPASE, AMYLASE in the last 168 hours. No results for input(s): AMMONIA in the last 168 hours. Coagulation Profile: No results for input(s): INR, PROTIME in the last 168 hours. Cardiac Enzymes: No results for input(s): CKTOTAL, CKMB, CKMBINDEX, TROPONINI in the last 168 hours. BNP (last 3 results) No results for input(s): PROBNP in the last 8760 hours. HbA1C: No results for input(s): HGBA1C in the last 72 hours. CBG: Recent Labs  Lab 05/14/17 1557 05/14/17 2044 05/15/17 0054 05/15/17 0344 05/15/17 0804  GLUCAP 179* 228* 213* 129* 104*   Lipid Profile: No results for input(s): CHOL, HDL, LDLCALC, TRIG, CHOLHDL, LDLDIRECT in the last 72 hours. Thyroid Function Tests: No results for  input(s): TSH, T4TOTAL, FREET4, T3FREE, THYROIDAB in the last 72 hours. Anemia Panel: No results for input(s): VITAMINB12, FOLATE, FERRITIN, TIBC, IRON, RETICCTPCT in the last 72 hours. Urine analysis:    Component Value Date/Time   COLORURINE YELLOW 04/19/2017 2252   APPEARANCEUR HAZY (A) 04/19/2017 2252   LABSPEC 1.008 04/19/2017 2252   PHURINE 7.0 04/19/2017 2252   GLUCOSEU NEGATIVE 04/19/2017 2252   HGBUR NEGATIVE 04/19/2017 2252   BILIRUBINUR NEGATIVE 04/19/2017 2252   KETONESUR 5 (A) 04/19/2017 2252   PROTEINUR NEGATIVE 04/19/2017 2252   UROBILINOGEN 0.2 12/04/2013 1518   NITRITE NEGATIVE 04/19/2017 2252   LEUKOCYTESUR NEGATIVE 04/19/2017 2252   Sepsis Labs: _0 (procalcitonin:4,lacticidven:4)  )No results found for this or any previous visit (from the past 240 hour(s)).    Studies: No results found.  Scheduled Meds: . chlorhexidine gluconate (MEDLINE KIT)  15 mL Mouth Rinse BID  . furosemide  10 mg Intravenous Q12H  . heparin injection (subcutaneous)  5,000 Units Subcutaneous Q12H  . insulin aspart  1-3 Units Subcutaneous Q4H  . insulin glargine  10 Units Subcutaneous QHS  . multivitamin  15 mL Per Tube Daily  . pantoprazole sodium  40 mg Per Tube Daily  . PARoxetine  40 mg Per Tube Daily  . polyethylene glycol  17 g Per Tube Daily  . potassium chloride  20 mEq Per Tube Q12H  . risperiDONE  2 mg Per Tube QHS    Continuous Infusions: . feeding supplement (OSMOLITE 1.2 CAL) 1,000 mL (05/15/17 0444)  . levETIRAcetam 500 mg (05/15/17 0437)     LOS: 26 days     Desiree Hane, MD [facility] Pager [phone]  If 7PM-7AM, please contact night-coverage [email or web address] Password [email or web address] 05/15/2017, 11:15 AM

## 2017-05-15 NOTE — Progress Notes (Signed)
Daily Progress Note   Patient Name: Rachel [name]       Date: 05/15/2017 DOB: 28-Jan-1951  Age: 67 y.o. MRN#: [id number] Attending Physician: Desiree Hane, MD Primary Care Physician: Iona Beard, MD Admit Date: 04/19/2017  Reason for Consultation/Follow-up: Establishing goals of care and Psychosocial/spiritual support  Subjective: Chart reviewed, met with patient and patient's husband Rachel [name].  Patient has received Ativan IV 0.5 mg at 11:00.  She is currently sleepy and does not awaken to my voice.  Her husband shares that he she has continued to try to talk to him but she is difficult to understand.  He is also describing agitation overnight when she has been awake  Length of Stay: 26  Current Medications: Scheduled Meds:  . chlorhexidine gluconate (MEDLINE KIT)  15 mL Mouth Rinse BID  . furosemide  10 mg Intravenous Q12H  . heparin injection (subcutaneous)  5,000 Units Subcutaneous Q12H  . insulin aspart  1-3 Units Subcutaneous Q4H  . insulin glargine  10 Units Subcutaneous QHS  . multivitamin  15 mL Per Tube Daily  . pantoprazole sodium  40 mg Per Tube Daily  . PARoxetine  40 mg Per Tube Daily  . polyethylene glycol  17 g Per Tube Daily  . potassium chloride  20 mEq Per Tube Q12H  . risperiDONE  2 mg Per Tube QHS    Continuous Infusions: . feeding supplement (OSMOLITE 1.2 CAL) 1,000 mL (05/15/17 0444)  . levETIRAcetam 500 mg (05/15/17 0437)    PRN Meds: albuterol, docusate, hydrALAZINE, LORazepam, morphine injection, ondansetron **OR** ondansetron (ZOFRAN) IV, traZODone  Physical Exam  Constitutional:  Acutely ill-appearing older female.  Minimally responsive this morning however per her husband, patient has received medication.  No acute distress noted  HENT:    Head: Normocephalic and atraumatic.  Pulmonary/Chest:  Poor respiratory effort but no tachypnea  Neurological:  Does not open her eyes to voice this morning  Skin: Skin is warm and dry.  Psychiatric:  Currently calm, no overt agitation otherwise unable to test  Nursing note and vitals reviewed.           Vital Signs: BP 137/67 (BP Location: Right Arm)   Pulse 99   Temp 97.7 F (36.5 C) (Axillary)   Resp 16   Ht 5' 6"  (1.676 m)   Wt  49.4 kg (108 lb 14.5 oz)   SpO2 100%   BMI 17.58 kg/m  SpO2: SpO2: 100 % O2 Device: O2 Device: Room Air O2 Flow Rate: O2 Flow Rate (L/min): 30 L/min  Intake/output summary:   Intake/Output Summary (Last 24 hours) at 05/15/2017 1237 Last data filed at 05/15/2017 0700 Gross per 24 hour  Intake 2850 ml  Output -  Net 2850 ml   LBM: Last BM Date: 05/15/17 Baseline Weight: Weight: 59 kg (130 lb) Most recent weight: Weight: 49.4 kg (108 lb 14.5 oz)       Palliative Assessment/Data:    Flowsheet Rows     Most Recent Value  Intake Tab  Referral Department  Surgery  Palliative Care Primary Diagnosis  Neurology  Date Notified  05/10/17  Palliative Care Type  Return patient Palliative Care  Reason for referral  Clarify Goals of Care  Date of Admission  04/19/17  Date first seen by Palliative Care  05/11/17  # of days Palliative referral response time  1 Day(s)  # of days IP prior to Palliative referral  21  Clinical Assessment  Psychosocial & Spiritual Assessment  Palliative Care Outcomes      Patient Active Problem List   Diagnosis Date Noted  . Palliative care by specialist   . Failure to wean from mechanical ventilation ([place])   . Goals of care, counseling/discussion   . Palliative care encounter   . Pressure injury of skin 04/23/2017  . Protein-calorie malnutrition, severe 04/23/2017  . Hypercalcemia 04/20/2017  . Hypokalemia 04/19/2017  . Brain edema ([place]) 04/19/2017  . Neoplasm of brain causing mass effect on adjacent  structures ([place]) 04/19/2017  . Fatigue 06/16/2016  . Somnolence, daytime 06/16/2016  . 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 Blind   . Schizophrenia ([place])   . Diabetes mellitus type 2 in nonobese (H[facility])   . Acute encephalopathy   . Brain mass   . Subarachnoid hemorrhage following injury, no loss of consciousness ([place]) 09/18/2015  . SAH (subarachnoid hemorrhage) ([place]) 09/17/2015  . Facial droop 12/04/2013  . 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 Blind in both eyes 05/31/2013  . HTN (hypertension) 10/11/2012  . PAF (paroxysmal atrial fibrillation) ([place]) 10/11/2012  . Dyslipidemia 10/11/2012  . H/O brain tumor 10/11/2012  . Insomnia due to mental disorder 12/29/2011  . Libido, decreased 12/29/2011  . 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  Consult ordered for goals of care  Patient is now out of ICU and has undergone a compassionate wean from the ventilator.  She is breathing on her own; no tachypnea.    She has had some agitation overnight and received Ativan 0.5 mg IV at 11 AM this morning and is currently sleepy   Assessment: Attempted to talk to her husband about her overall clinical picture but he is in a very difficult place in terms of coming to grips with her grim prognosis.  Rachel [name], just keeps saying over and over "she is getting better, she is getting better".  Did review the pathophysiology secondary to underlying clinical condition and why we are  seeing what we are seeing clinically e.g. lethargy.  I also  reiterated that I too want to maintain hope but I am also very concerned about her.  Recommendations/Plan:  Palliative medicine team to continue to to support patient and husband.  My sense is this is just going to take some time for her husband to come to grips with her grim prognosis.  Since she has extubated and did not die post extubation, and has since attempted to speak to him, this is giving him hope  Will need to monitor for for agitation.  Agree with continuing Ativan at 0.5 mg every 4 hours as needed for now.  I do not have a sense at this point whether she is beginning to exhibit end-of-life agitation secondary to worsening clinical condition    Goals of Care and Additional Recommendations:  Limitations on Scope of Treatment: Full Scope Treatment  Code Status:    Code Status Orders  (From admission, onward)        Start     Ordered   05/12/17 0957  DNR (Do not attempt resuscitation)  Continuous    Question Answer Comment  In the event of cardiac or respiratory ARREST Do not call a "code blue"   In the event of cardiac or respiratory ARREST Do not perform Intubation, CPR, defibrillation or ACLS   In the event of cardiac or respiratory ARREST Use medication by any route, position, wound care, and other measures to relive pain and suffering. May use oxygen, suction and manual treatment of airway obstruction as needed for comfort.      05/12/17 0956    Code Status History    Date Active Date Inactive Code Status Order ID Comments User Context   05/11/2017 1011 05/12/2017 0956 DNR [id number]  Pershing Proud, NP Inpatient   04/19/2017 2239 05/11/2017 1011 Full Code [id number]  Toy Baker, MD ED   09/17/2015 2213 09/19/2015 1516 Full Code [id number]  Wallie Char Inpatient   08/15/2015 1546 08/18/2015 2319 Full Code [id number]  Julianne Rice, MD ED   12/04/2013 1844 12/05/2013 1730 Full Code [id number]  Doree Albee, MD ED       Prognosis:   Unable to determine but  given pt's underlying neurological disease she would meet hospice benefit in the home and potentially residential if she were to transition to bolus feeds with an established end point e.g. Worsening upper airway secretions, excess residual  Discharge Planning:  To Be Determined  Care plan was discussed with Dr. Theodoro Grist  Thank you for allowing the Palliative Medicine Team to assist in the care of this patient.   Time In: 1200 Time Out: 1225 Total Time 25 min Prolonged Time Billed  no       Greater than 50%  of this time was spent counseling and coordinating care related to the above assessment and plan.  Dory Horn, NP  Please contact Palliative Medicine Team phone at [phone] for questions and concerns.

## 2017-05-16 LAB — GLUCOSE, CAPILLARY
GLUCOSE-CAPILLARY: 173 mg/dL — AB (ref 65–99)
GLUCOSE-CAPILLARY: 213 mg/dL — AB (ref 65–99)
Glucose-Capillary: 183 mg/dL — ABNORMAL HIGH (ref 65–99)
Glucose-Capillary: 184 mg/dL — ABNORMAL HIGH (ref 65–99)
Glucose-Capillary: 186 mg/dL — ABNORMAL HIGH (ref 65–99)
Glucose-Capillary: 187 mg/dL — ABNORMAL HIGH (ref 65–99)

## 2017-05-16 LAB — CALCIUM: Calcium: 10.9 mg/dL — ABNORMAL HIGH (ref 8.9–10.3)

## 2017-05-16 MED ORDER — SODIUM CHLORIDE 0.9 % IV SOLN
INTRAVENOUS | Status: AC
  Administered 2017-05-16: 08:00:00 via INTRAVENOUS

## 2017-05-16 NOTE — Evaluation (Signed)
Clinical/Bedside Swallow Evaluation Patient Details  Name: Rachel [name] MRN: [id number] Date of Birth: 14-Apr-1950  Today's Date: 05/16/2017 Time: SLP Start Time (ACUTE ONLY): 57 SLP Stop Time (ACUTE ONLY): 1606 SLP Time Calculation (min) (ACUTE ONLY): 14 min  Past Medical History:  Past Medical History:  Diagnosis Date  . Blindness   . Diabetes mellitus type I ([place])   . GERD (gastroesophageal reflux disease)   . Hyperlipidemia   . Paroxysmal atrial fibrillation (H[facility])   . Schizophrenia ([place])   . Sleep apnea   . Subarachnoid hemorrhage ([place])   . Systemic hypertension   . Vertigo    Past Surgical History:  Past Surgical History:  Procedure Laterality Date  . BRAIN TUMOR EXCISION    . CRANIOTOMY Left 04/23/2017   Procedure: CRANIOTOMY TUMOR EXCISION;  Surgeon: Earnie Larsson, MD;  Location: [facility];  Service: Neurosurgery;  Laterality: Left;  left  . NM MYOCAR PERF WALL MOTION  02/01/2007   no significant ischemia  . US ECHOCARDIOGRAPHY  12/20/2003   mild mitral annular ca+,mild MR,TR,PI,AOV mildly sclerotic   HPI:  67 year old female with history of Rosai-Dorfman's disease as well as diabetes mellitus and schizophrenia presents with worsening right-sided weakness and aphasia.  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  Patient admitted for treatment with IV steroids and possible later surgical resection.  No history of seizure.  Patient has a history of blindness.  She is status post a remote occipital craniectomy and resection of extradural lesion years ago. MRI shows LEFT dural thickening with 2 dural-based masses again noted. Anterior temporal extra-axial solidly enhancing mass was 16 x 24 mm, now 15 x 21 mm. The more posterior mass was 0.7 x 1.2 cm, now 2.5 x 3.3 cm corresponding to CT abnormality. Mass effect on LEFT temporal lobes with severe T2 bright presume  vasogenic edema LEFT temporal frontal and parietal lobes including posterior limb of the internal capsule. Regional mass effect with 3 mm new LEFT-to-RIGHT midline shift. LEFT uncal herniation. Partial effacement LEFT lateral ventricle without RIGHT ventricle entrapment or, hydrocephalus. Hemosiderin staining mesial LEFT parietal lobe at site of prior hemorrhage. Numerous scattered chronic micro hemorrhages within supra-and infratentorial brain. Moderate to severe parenchymal brain volume loss. No abnormal extra-axial fluid collections. Pt had BSE earlier this admission on 3/13 with primarily cognitively-based dysphagia with limited oral acceptance of POs. With cueing and precautions, it was recommended that she started with sips of water only. Pt was then intubated 3/15 for L craniotomy. Extubation was attempted after the procedure but she required reintubation. Pt then remained intubated until one-way extubation 4/3.   Assessment / Plan / Recommendation Clinical Impression  Pt continues to have a cognitively-based oral dysphagia that is similar to evaluation earlier this admission. She does also appear to have an acute decline in her ability to protect her airway s/p such a lengthy intubation (3/15-4/3). She did not vocalize throughout this evaluation and family says that she has not been talking with them today, but that yesterday when she did her voice was hoarse. SLP provided Max cues for oral acceptance of thin liquids in small increments from a swab, with anterior spillage noted without pt awareness. Immediate coughing followed each bolus, suggestive of airway compromise. I explained to the family that some elements of her dysphagia may have some potential for recovery as suspected laryngeal irritation subsides. However, it is possible that the more  cognitively-based issues could be a longer term problem impacting how well she can maintain her nutrition and hydration. For now, they agree to keep her NPO;  she already has a cortrak for supplemental nutrition. SLP will continue to follow for additional education and pt readiness to restart POs/participate in instrumental testing. SLP Visit Diagnosis: Dysphagia, oropharyngeal phase (R13.12)    Aspiration Risk  Severe aspiration risk;Risk for inadequate nutrition/hydration    Diet Recommendation NPO   Medication Administration: Via alternative means    Other  Recommendations Oral Care Recommendations: Oral care QID Other Recommendations: Have oral suction available   Follow up Recommendations (tba)      Frequency and Duration min 2x/week  2 weeks       Prognosis Prognosis for Safe Diet Advancement: Fair Barriers to Reach Goals: Cognitive deficits;Other (Comment)(overall medical prognosis)      Swallow Study   General HPI: 67 year old female with history of Rosai-Dorfman's disease as well as diabetes mellitus and schizophrenia presents with worsening right-sided weakness and aphasia.  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  Patient admitted for treatment with IV steroids and possible later surgical resection.  No history of seizure.  Patient has a history of blindness.  She is status post a remote occipital craniectomy and resection of extradural lesion years ago. MRI shows LEFT dural thickening with 2 dural-based masses again noted. Anterior temporal extra-axial solidly enhancing mass was 16 x 24 mm, now 15 x 21 mm. The more posterior mass was 0.7 x 1.2 cm, now 2.5 x 3.3 cm corresponding to CT abnormality. Mass effect on LEFT temporal lobes with severe T2 bright presume vasogenic edema LEFT temporal frontal and parietal lobes including posterior limb of the internal capsule. Regional mass effect with 3 mm new LEFT-to-RIGHT midline shift. LEFT uncal herniation. Partial effacement LEFT lateral ventricle without RIGHT ventricle  entrapment or, hydrocephalus. Hemosiderin staining mesial LEFT parietal lobe at site of prior hemorrhage. Numerous scattered chronic micro hemorrhages within supra-and infratentorial brain. Moderate to severe parenchymal brain volume loss. No abnormal extra-axial fluid collections. Pt had BSE earlier this admission on 3/13 with primarily cognitively-based dysphagia with limited oral acceptance of POs. With cueing and precautions, it was recommended that she started with sips of water only. Pt was then intubated 3/15 for L craniotomy. Extubation was attempted after the procedure but she required reintubation. Pt then remained intubated until one-way extubation 4/3. Type of Study: Bedside Swallow Evaluation Previous Swallow Assessment: see HPI Diet Prior to this Study: NPO;NG Tube Temperature Spikes Noted: No Respiratory Status: Room air History of Recent Intubation: Yes Length of Intubations (days): 20 days(across 2 intubations) Date extubated: 05/12/17 Behavior/Cognition: Alert;Doesn't follow directions Oral Cavity Assessment: Other (comment)(difficult to assess, does not open mouth much) Oral Care Completed by SLP: Yes Oral Cavity - Dentition: Adequate natural dentition Self-Feeding Abilities: Total assist Patient Positioning: Upright in bed Baseline Vocal Quality: Not observed;Other (comment)(per family she is hoarse) Volitional Cough: Cognitively unable to elicit Volitional Swallow: Unable to elicit    Oral/Motor/Sensory Function Overall Oral Motor/Sensory Function: Other (comment)(does not follow commands to formally assess)   Ice Chips Ice chips: Not tested   Thin Liquid Thin Liquid: Impaired Presentation: (swab) Oral Phase Impairments: Poor awareness of bolus Oral Phase Functional Implications: Oral holding;Right anterior spillage;Left anterior spillage Pharyngeal  Phase Impairments: Cough - Immediate    Nectar Thick Nectar Thick Liquid: Not tested   Honey  Thick Honey Thick Liquid:  Not tested   Puree Puree: Not tested   Solid   GO   Solid: Not tested        Germain Osgood 05/16/2017,4:25 PM  Germain Osgood, M.A. CCC-SLP [phone]

## 2017-05-16 NOTE — Progress Notes (Addendum)
PROGRESS NOTE  Rachel [name] HQP:[id number] DOB: 1950-11-27 DOA: 04/19/2017 PCP: Iona Beard, MD  HPI/Recap of past 24 hours:  Rachel [name] is a 67 y.o. 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    Extra-axial masses consistent with meningioma vs lymphoproliferative tissue associated with her Droman disease. Initially attempted stabilization with IV decadron then underwent elective left craniotomy for resection of tumors with microdissection on 3/15 by NSGY. She failed post procedure extubation due to increased somnolence and inability to protect airway.  Keppra dosing was increased due to persistent poor responsiveness while intubated though EEG showed no seizure activity. Repeat CT imaging showed no acute changes with slight improvement in left cerebral vasogenic edema and similar left to right midline shift. Neurology recommended continuing AEDs and decadron with concern swelling was likely culprit of her encephalopathy.  MRI Brain on 3/20 consistent with scattered embolic strokes of right hemisphere, neurology believed these findings were incidental and not contributing to her poor mentation. Given her poor baseline prior to admission (dependent in ADLs and IADLs) believed this would be a very slow process for potential improvement and recommended palliative's involvement.    4/3 after husband spoke with palliative care team decided for one extubation with plan for DNR and no reintubation with plan to transition to comfort care if she was unable to breathe on her own.  She was extubated to 2 L [place] with no complications.   Family asked for restarting tube feeding and medications to given her a chance to see if she could recover further prior to transfer to [facility] for further management.   Subjective Husband at bedside.  She opens eyes to voice. Not interactive today.   Assessment/Plan: Active Problems:   HTN (hypertension)   PAF (paroxysmal atrial fibrillation) (HCC)   Rosai-Dorfman disease (HCC)   Diabetes mellitus type 2 in nonobese (HCC)   Acute encephalopathy   Hypokalemia   Brain edema (HCC)   Neoplasm of brain causing mass effect on adjacent structures (HCC)   Hypercalcemia   Pressure injury of skin   Protein-calorie malnutrition, severe   Failure to wean from mechanical ventilation (HCC)   Goals of care, counseling/discussion   Palliative care encounter   Palliative care by specialist   #Enlarging left sided extradural masses with vasogenic edema. S/p tumors resection/craniectomy on 3/15. Unchanged mass effect and edema on MRI from 3/20.Presumed tumors attributed to Rosai-Dorfman disease. Continue IV keppra  #Encephalopathy, stable most likely multifactorial etiology: Related to vasogenic edema and midline shift due to above,  Still opening eyes to voice and attempting to talk, no changes otherwise.   -Patient prognosis seems poor, myself and palliative medicine team trying to intiatie goals of care discussion with husband - I spoke with him about her requiring TF as she has expressed no interest to eat. She has not been evaluated by speech since being extubated so I will do that before determining next course of action  - continue Ativan 0.5 mg every 4 as needed. PRN IV morphine for severe pain, Iv antiemetics  #Paroxysmal Atrial Fibrillation, currently rate controlled- unable to tolerate PO given encephalopathy, the last 40 hours has not  required any IV metoprolol as has remained rate controlled on room, will continue to hold home diltiazem   #Acute respiratory failure related to  encephalopathy due to above,resolved. Now on room air after one way extubation on 4/3. Previously required intubation shortly after craniectomy on 3/15.   #Schizophrenia/anxiety/depression- home trazodone, risperidone and paxil via Dobhovt, PRN IV ativan to prevent seizure withdrawal in setting of chronic benzodiazepine since day of admission  #T2DM- A1c 7.4%(04/2017), lantus 10 U, sliding scale insulin  # Hypercalcemia, slightly worse.  -Peak of 11.8 during admission - elevated again at 10.9 - timed IVF 75 cc/hr x 12 hours, reassess in am  Code Status: DNR  Family Communication: Husband and at bedside  Disposition Plan:  Continue goals of care discussion with husband, on tube feeds (consulting speech for evaluation per husband request), monitoring agitation with IV ativan, still on IV keppra for seizure prophylaxis will need to deescalate to oral. Currently has NGT would have to consider PEG tube if patient's mental status doesn't improve enough to maintain PO will see what speech evaluation shows.  Consultants:  PCCM, [place], Neurology, Palliative, Speech  Procedures:  Craniectomy 3/15   EEG 3/19  TTE 3/21, EF 65-70%, moderate LVH  Antimicrobials:  none  Cultures:  none  Telemetry:yes  DVT prophylaxis: SCDs  Objective: Vitals:   05/15/17 0905 05/15/17 2300 05/16/17 0631 05/16/17 0805  BP: 137/67 132/82 135/84 128/76  Pulse: 99 93 88 (!) 101  Resp: 16 16 16 16   Temp: 97.7 F (36.5 C) 97.7 F (36.5 C) 98.6 F (37 C) 98.2 F (36.8 C)  TempSrc: Axillary Axillary Oral Axillary  SpO2: 100% 93% 100% 97%  Weight:   51.1 kg (112 lb 10.5 oz)   Height:        Intake/Output Summary (Last 24 hours) at 05/16/2017 1436 Last data filed at 05/16/2017 0946 Gross per 24 hour  Intake 1586.67 ml  Output -  Net 1586.67 ml   Filed Weights   05/13/17 0500 05/14/17 0512 05/16/17 0631  Weight: 50.7 kg (111 lb 12.4 oz) 49.4 kg (108 lb 14.5 oz) 51.1 kg (112 lb 10.5 oz)     Exam:  Constitutional:cachetic, chronically ill appearing female lying in bed Eyes: opens eyes to voice Cardiovascular: RRR no MRGs, with no peripheral edema Respiratory: Normal respiratory effort on room air, clear breath sounds  Abdomen: Soft,non-tender Skin: No rash ulcers, or lesions. Without skin tenting  Neurologic: opens eyes to voice, moves left and right arm and leg on own  Data Reviewed: CBC: Recent Labs  Lab 05/10/17 0309 05/11/17 0546 05/12/17 0354  WBC 7.2 7.2 7.2  NEUTROABS 4.5  --   --   HGB 9.6* 10.8* 10.2*  HCT 28.6* 33.9* 31.0*  MCV 93.8 94.4 92.5  PLT 120* 133* 357*   Basic Metabolic Panel: Recent Labs  Lab 05/10/17 0309 05/10/17 1947 05/11/17 0546 05/12/17 0354 05/13/17 1732 05/16/17 0503  NA 137 139 140 138  --   --   K 3.2* 4.8 4.4 3.9  --   --   CL 102 105 104 106  --   --   CO2 27 28 28 24   --   --   GLUCOSE 159* 168* 113* 148*  --   --   BUN 22* 23* 22* 23*  --   --   CREATININE 0.37* 0.37* 0.38* 0.40*  --   --   CALCIUM 9.8 10.2 10.8* 10.4* 10.5* 10.9*  MG  --   --  2.1  2.2  --   --   PHOS  --   --  2.3* 2.5  --   --    GFR: Estimated Creatinine Clearance: 57.3 mL/min (A) (by C-G formula based on SCr of 0.4 mg/dL (L)). Liver Function Tests: No results for input(s): AST, ALT, ALKPHOS, BILITOT, PROT, ALBUMIN in the last 168 hours. No results for input(s): LIPASE, AMYLASE in the last 168 hours. No results for input(s): AMMONIA in the last 168 hours. Coagulation Profile: No results for input(s): INR, PROTIME in the last 168 hours. Cardiac Enzymes: No results for input(s): CKTOTAL, CKMB, CKMBINDEX, TROPONINI in the last 168 hours. BNP (last 3 results) No results for input(s): PROBNP in the last 8760 hours. HbA1C: No results for input(s): HGBA1C in the last 72 hours. CBG: Recent Labs  Lab 05/15/17 2009 05/15/17 2341 05/16/17 0349 05/16/17 0809 05/16/17 1214  GLUCAP 164* 127* 184* 186* 173*   Lipid Profile: No results for  input(s): CHOL, HDL, LDLCALC, TRIG, CHOLHDL, LDLDIRECT in the last 72 hours. Thyroid Function Tests: No results for input(s): TSH, T4TOTAL, FREET4, T3FREE, THYROIDAB in the last 72 hours. Anemia Panel: No results for input(s): VITAMINB12, FOLATE, FERRITIN, TIBC, IRON, RETICCTPCT in the last 72 hours. Urine analysis:    Component Value Date/Time   COLORURINE YELLOW 04/19/2017 2252   APPEARANCEUR HAZY (A) 04/19/2017 2252   LABSPEC 1.008 04/19/2017 2252   PHURINE 7.0 04/19/2017 2252   GLUCOSEU NEGATIVE 04/19/2017 2252   HGBUR NEGATIVE 04/19/2017 2252   BILIRUBINUR NEGATIVE 04/19/2017 2252   KETONESUR 5 (A) 04/19/2017 2252   PROTEINUR NEGATIVE 04/19/2017 2252   UROBILINOGEN 0.2 12/04/2013 1518   NITRITE NEGATIVE 04/19/2017 2252   LEUKOCYTESUR NEGATIVE 04/19/2017 2252   Sepsis Labs: @LABRCNTIP (procalcitonin:4,lacticidven:4)  )No results found for this or any previous visit (from the past 240 hour(s)).    Studies: No results found.  Scheduled Meds: . chlorhexidine gluconate (MEDLINE KIT)  15 mL Mouth Rinse BID  . furosemide  10 mg Intravenous Q12H  . heparin injection (subcutaneous)  5,000 Units Subcutaneous Q12H  . insulin aspart  1-3 Units Subcutaneous Q4H  . insulin glargine  10 Units Subcutaneous QHS  . multivitamin  15 mL Per Tube Daily  . pantoprazole sodium  40 mg Per Tube Daily  . PARoxetine  40 mg Per Tube Daily  . polyethylene glycol  17 g Per Tube Daily  . potassium chloride  20 mEq Per Tube Q12H  . risperiDONE  2 mg Per Tube QHS    Continuous Infusions: . sodium chloride 75 mL/hr at 05/16/17 0759  . feeding supplement (OSMOLITE 1.2 CAL) 1,000 mL (05/15/17 2256)  . levETIRAcetam Stopped (05/16/17 0437)     LOS: 43 days     Desiree Hane, MD [facility] Pager [phone]  If 7PM-7AM, please contact night-coverage [email or web address] Password [facility] 05/16/2017, 2:36 PM

## 2017-05-17 ENCOUNTER — Inpatient Hospital Stay (HOSPITAL_COMMUNITY): Payer: BLUE CROSS/BLUE SHIELD

## 2017-05-17 DIAGNOSIS — R1310 Dysphagia, unspecified: Secondary | ICD-10-CM

## 2017-05-17 LAB — GLUCOSE, CAPILLARY
GLUCOSE-CAPILLARY: 330 mg/dL — AB (ref 65–99)
GLUCOSE-CAPILLARY: 277 mg/dL — AB (ref 65–99)
Glucose-Capillary: 203 mg/dL — ABNORMAL HIGH (ref 65–99)

## 2017-05-17 LAB — CALCIUM: Calcium: 11.2 mg/dL — ABNORMAL HIGH (ref 8.9–10.3)

## 2017-05-17 MED ORDER — SODIUM CHLORIDE 0.9 % IV SOLN
INTRAVENOUS | Status: DC
  Administered 2017-05-17: 22:00:00 via INTRAVENOUS
  Administered 2017-05-18: 1 mL via INTRAVENOUS

## 2017-05-17 NOTE — Progress Notes (Signed)
PROGRESS NOTE  Rachel [name] INO:[id number] DOB: 06/17/1950 DOA: 04/19/2017 PCP: Iona Beard, MD  HPI/Recap of past 24 hours:  Rachel [name] is a 67 y.o. 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    Extra-axial masses consistent with meningioma vs lymphoproliferative tissue associated with her Droman disease. Initially attempted stabilization with IV decadron then underwent elective left craniotomy for resection of tumors with microdissection on 3/15 by NSGY. She failed post procedure extubation due to increased somnolence and inability to protect airway.  Keppra dosing was increased due to persistent poor responsiveness while intubated though EEG showed no seizure activity. Repeat CT imaging showed no acute changes with slight improvement in left cerebral vasogenic edema and similar left to right midline shift. Neurology recommended continuing AEDs and decadron with concern swelling was likely culprit of her encephalopathy.  MRI Brain on 3/20 consistent with scattered embolic strokes of right hemisphere, neurology believed these findings were incidental and not contributing to her poor mentation. Given her poor baseline prior to admission (dependent in ADLs and IADLs) believed this would be a very slow process for potential improvement and recommended palliative's involvement.    4/3 after husband spoke with palliative care team decided for one extubation with plan for DNR and no reintubation with plan to transition to comfort care if she was unable to breathe on her own.  She was extubated to 2 L [place] with no complications.   Family asked for restarting tube feeding and medications to given her a chance to see if she could recover further prior to transfer to [facility] for further management.   Subjective No family at bedside. Patient not interactive. Not opening eyes, moving extremities during exam. No acute events overnight  Assessment/Plan: Active Problems:   HTN (hypertension)   PAF (paroxysmal atrial fibrillation) (HCC)   Rosai-Dorfman disease (HCC)   Diabetes mellitus type 2 in nonobese (HCC)   Acute encephalopathy   Hypokalemia   Brain edema (HCC)   Neoplasm of brain causing mass effect on adjacent structures (HCC)   Hypercalcemia   Pressure injury of skin   Protein-calorie malnutrition, severe   Failure to wean from mechanical ventilation (HCC)   Goals of care, counseling/discussion   Palliative care encounter   Palliative care by specialist   #Enlarging left sided extradural masses with vasogenic edema.  -S/p tumors resection/craniectomy on 3/15.  -Unchanged mass effect and edema on MRI from 3/20. -Presumed tumors attributed to Rosai-Dorfman disease.  -Continue IV keppra for prophylaxis  #Encephalopathy, stable  -most likely multifactorial etiology: Related to vasogenic edema and midline shift due to above,  - throughout day will open eyes to voice, but not interactive beyond that - palliative care discussion with husband given poor prognosis to regain increased cognitive function - continue Ativan 0.5 mg every 4 as needed. PRN IV morphine for severe pain, Iv antiemetics  #Dysphagia, stable - cognitively based, also exacerbated with intubation - speech evaluation on 4/7: still NPO given poor mental status - On Tube Feeds via cortrak tube - Will discuss with husband quality of life/prognosis since cortrak not long term and PEG tube possiblity -Patient prognosis seems poor, myself and  palliative medicine team trying to intiatie goals of care discussion with husband  #Paroxysmal Atrial  Fibrillation, currently rate controlled - unable to tolerate PO given encephalopathy,  - will continue to hold home diltiazem   #Acute respiratory failure related to encephalopathy due to above,resolved. - Stable on room air -s/p one way extubation on 4/3 in ICU  - Previously required intubation shortly after craniectomy on 3/15   #Schizophrenia/anxiety/depression - home trazodone, risperidone and paxil via feeding tube - PRN IV ativan to prevent benzo withdrawal in setting of chronic benzodiazepine since day of admission  #T2DM- A1c 7.4%(04/2017),  -lantus 10 U, sliding scale insulin while on tube feeds  # Hypercalcemia, slightly worse.  -Peak of 11.8 during admission - elevated again at 10.9(4/7), awaiting morning labs - timed IVF 75 cc/hr currently awaiting Ca  Code Status: DNR  Family Communication: No family at bedside  Disposition Plan:  Continue goals of care discussion with husband- to discuss PEG tube and qualityf of life and continued discussion on quality of life, appreciate palliative medicine help,   Consultants:  PCCM, [place], Neurology, Palliative, Speech  Procedures:  Craniectomy 3/15   EEG 3/19  TTE 3/21, EF 65-70%, moderate LVH  Antimicrobials:  none  Cultures:  none  Telemetry:yes  DVT prophylaxis: SCDs  Objective: Vitals:   05/16/17 0631 05/16/17 0805 05/16/17 2128 05/17/17 0649  BP: 135/84 128/76 120/80 (!) 126/95  Pulse: 88 (!) 101 70 69  Resp: _0 Temp: 98.6 F (37 C) 98.2 F (36.8 C) 98.8 F (37.1 C) 97.7 F (36.5 C)  TempSrc: Oral Axillary Axillary Axillary  SpO2: 100% 97% 100% 100%  Weight: 51.1 kg (112 lb 10.5 oz)     Height:        Intake/Output Summary (Last 24 hours) at 05/17/2017 0855 Last data filed at 05/17/2017 0521 Gross per 24 hour  Intake 2060 ml  Output 1250 ml  Net 810 ml   Filed Weights   05/13/17 0500 05/14/17 0512 05/16/17 0631  Weight: 50.7 kg (111 lb 12.4 oz) 49.4 kg (108 lb 14.5 oz) 51.1 kg  (112 lb 10.5 oz)    Exam:  Constitutional:cachetic, chronically ill appearing female lying in bed Eyes: eyes closed Cardiovascular: RRR no MRGs, with no peripheral edema Respiratory: Normal respiratory effort on room air, clear breath sounds  Abdomen: Soft,non-tender Skin: No rash ulcers, or lesions. Without skin tenting  Neurologic:  moves left and right arm and leg sporadically, not following commands  Data Reviewed: CBC: Recent Labs  Lab 05/11/17 0546 05/12/17 0354  WBC 7.2 7.2  HGB 10.8* 10.2*  HCT 33.9* 31.0*  MCV 94.4 92.5  PLT 133* 267*   Basic Metabolic Panel: Recent Labs  Lab 05/10/17 1947 05/11/17 0546 05/12/17 0354 05/13/17 1732 05/16/17 0503  NA 139 140 138  --   --   K 4.8 4.4 3.9  --   --   CL 105 104 106  --   --   CO2 _1 --   --   GLUCOSE 168* 113* 148*  --   --   BUN 23* 22* 23*  --   --   CREATININE 0.37* 0.38* 0.40*  --   --   CALCIUM 10.2 10.8* 10.4* 10.5* 10.9*  MG  --  2.1 2.2  --   --   PHOS  --  2.3* 2.5  --   --    GFR: Estimated Creatinine Clearance: 57.3 mL/min (A) (by C-G formula based  on SCr of 0.4 mg/dL (L)). Liver Function Tests: No results for input(s): AST, ALT, ALKPHOS, BILITOT, PROT, ALBUMIN in the last 168 hours. No results for input(s): LIPASE, AMYLASE in the last 168 hours. No results for input(s): AMMONIA in the last 168 hours. Coagulation Profile: No results for input(s): INR, PROTIME in the last 168 hours. Cardiac Enzymes: No results for input(s): CKTOTAL, CKMB, CKMBINDEX, TROPONINI in the last 168 hours. BNP (last 3 results) No results for input(s): PROBNP in the last 8760 hours. HbA1C: No results for input(s): HGBA1C in the last 72 hours. CBG: Recent Labs  Lab 05/16/17 1214 05/16/17 1616 05/16/17 2054 05/16/17 2353 05/17/17 0515  GLUCAP 173* 213* 183* 187* 203*   Lipid Profile: No results for input(s): CHOL, HDL, LDLCALC, TRIG, CHOLHDL, LDLDIRECT in the last 72 hours. Thyroid Function Tests: No  results for input(s): TSH, T4TOTAL, FREET4, T3FREE, THYROIDAB in the last 72 hours. Anemia Panel: No results for input(s): VITAMINB12, FOLATE, FERRITIN, TIBC, IRON, RETICCTPCT in the last 72 hours. Urine analysis:    Component Value Date/Time   COLORURINE YELLOW 04/19/2017 2252   APPEARANCEUR HAZY (A) 04/19/2017 2252   LABSPEC 1.008 04/19/2017 2252   PHURINE 7.0 04/19/2017 2252   GLUCOSEU NEGATIVE 04/19/2017 2252   HGBUR NEGATIVE 04/19/2017 2252   BILIRUBINUR NEGATIVE 04/19/2017 2252   KETONESUR 5 (A) 04/19/2017 2252   PROTEINUR NEGATIVE 04/19/2017 2252   UROBILINOGEN 0.2 12/04/2013 1518   NITRITE NEGATIVE 04/19/2017 2252   LEUKOCYTESUR NEGATIVE 04/19/2017 2252   Sepsis Labs: _0 (procalcitonin:4,lacticidven:4)  )No results found for this or any previous visit (from the past 240 hour(s)).    Studies: No results found.  Scheduled Meds: . chlorhexidine gluconate (MEDLINE KIT)  15 mL Mouth Rinse BID  . furosemide  10 mg Intravenous Q12H  . heparin injection (subcutaneous)  5,000 Units Subcutaneous Q12H  . insulin aspart  1-3 Units Subcutaneous Q4H  . insulin glargine  10 Units Subcutaneous QHS  . multivitamin  15 mL Per Tube Daily  . pantoprazole sodium  40 mg Per Tube Daily  . PARoxetine  40 mg Per Tube Daily  . polyethylene glycol  17 g Per Tube Daily  . potassium chloride  20 mEq Per Tube Q12H  . risperiDONE  2 mg Per Tube QHS    Continuous Infusions: . feeding supplement (OSMOLITE 1.2 CAL) 1,000 mL (05/16/17 1830)  . levETIRAcetam Stopped (05/17/17 0525)     LOS: 28 days     Desiree Hane, MD [facility] Pager [phone]  If 7PM-7AM, please contact night-coverage [email or web address] Password [facility] 05/17/2017, 8:55 AM

## 2017-05-17 NOTE — Progress Notes (Signed)
  Speech Language Pathology Treatment: Dysphagia  Patient Details Name: Rachel [name] MRN: [id number] DOB: 1950-08-13 Today's Date: 05/17/2017 Time: 3546-5681 SLP Time Calculation (min) (ACUTE ONLY): 13 min  Assessment / Plan / Recommendation Clinical Impression  Patient seen for diagnostic SLP treatment. Patient sleeping upon arrival. Repositioned and although not with consistent eye opening, patient responsive to basic 1-step commands in the context of oral care and po trials. Oral phase of swallow appearing appropriate once bolus hits lips. Patient able to orally transit trials of ice chips and pureed solids and initiate a swallow consistently. Largely consistent throat clearing noted post swallow however suggestive of deep penetration and/or aspiration. Vocal quality aphonic today. Spouse present and eager for patient to initiate a po diet. Prognosis guarded based on presentation however given that patient is demonstrating intact awareness of bolus, appropriate oral transit, and consistent initiation of the swallow, will proceed with MBS to instrumentally assess swallow in order to make more informed decisions going forward regarding initiation of a po diet.   HPI HPI: 67 year old female with history of Rosai-Dorfman's disease as well as diabetes mellitus and schizophrenia presents with worsening right-sided weakness and aphasia.  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  Patient admitted for treatment with IV steroids and possible later surgical resection.  No history of seizure.  Patient has a history of blindness.  She is status post a remote occipital craniectomy and resection of extradural lesion years ago. MRI shows LEFT dural thickening with 2 dural-based masses again noted. Anterior temporal extra-axial solidly enhancing mass was 16 x 24 mm, now 15 x 21 mm. The more posterior  mass was 0.7 x 1.2 cm, now 2.5 x 3.3 cm corresponding to CT abnormality. Mass effect on LEFT temporal lobes with severe T2 bright presume vasogenic edema LEFT temporal frontal and parietal lobes including posterior limb of the internal capsule. Regional mass effect with 3 mm new LEFT-to-RIGHT midline shift. LEFT uncal herniation. Partial effacement LEFT lateral ventricle without RIGHT ventricle entrapment or, hydrocephalus. Hemosiderin staining mesial LEFT parietal lobe at site of prior hemorrhage. Numerous scattered chronic micro hemorrhages within supra-and infratentorial brain. Moderate to severe parenchymal brain volume loss. No abnormal extra-axial fluid collections. Pt had BSE earlier this admission on 3/13 with primarily cognitively-based dysphagia with limited oral acceptance of POs. With cueing and precautions, it was recommended that she started with sips of water only. Pt was then intubated 3/15 for L craniotomy. Extubation was attempted after the procedure but she required reintubation. Pt then remained intubated until one-way extubation 4/3.      SLP Plan  MBS       Recommendations  Diet recommendations: NPO Medication Administration: Via alternative means                Oral Care Recommendations: Oral care QID Follow up Recommendations: (tba) SLP Visit Diagnosis: Dysphagia, oropharyngeal phase (R13.12) Plan: MBS       GO          Gabriel Rainwater MA, CCC-SLP [phone]       Shjon Lizarraga Meryl 05/17/2017, 10:47 AM

## 2017-05-17 NOTE — Progress Notes (Signed)
Modified Barium Swallow Progress Note  Patient Details  Name: Rachel [name] MRN: [id number] Date of Birth: 17-Nov-1950  Today's Date: 05/17/2017  Modified Barium Swallow completed.  Full report located under Chart Review in the Imaging Section.  Brief recommendations include the following:  Clinical Impression  Patient presents with a moderate oropharyngeal dysphagia, exacerbated by acute changes in mentation. Patient initially asleep, requiring max verbal and tactile cueing to arouse enough for po intake, including completion of oral care. Once aroused however, and despite maintaining eyes closed throughout exam, patient willingly opens mouth for intake of bolus with gentle tactile cue to bottom lip. Oral transit then largely normal with both pureed solids and liquids with only intermittently delayed oral transit. Expectoration of soft solid bolus noted. Patient able to protect airway with pureed solids and nectar thick liquids via tsp, penetrating deeply and with question of aspiration (difficult to view given poor posture) of nectar thick and thin liquid via straw. Only subtle, non-productive throat clear in response.  Unfortunately, patient unable to take cup sips due to combination of poor head control and decreased awareness. Additionally, mild residuals remain post swallow. Although mentation significantly increases risk of aspiration, if family wishes, would consider initiation of pureed solids and nectar thick liquids via spoon when alert. SLP will f/u.    Swallow Evaluation Recommendations       SLP Diet Recommendations: Dysphagia 1 (Puree) solids;Nectar thick liquid   Liquid Administration via: Spoon   Medication Administration: Crushed with puree   Supervision: Full assist for feeding;Full supervision/cueing for compensatory strategies   Compensations: Slow rate;Small sips/bites   Postural Changes: Seated upright at 90 degrees   Oral Care Recommendations: Oral care  BID   Other Recommendations: Order thickener from pharmacy;Prohibited food (jello, ice cream, thin soups);Remove water pitcher   Gabriel Rainwater MA, CCC-SLP [phone]  Dyer Klug Meryl 05/17/2017,2:41 PM

## 2017-05-18 DIAGNOSIS — R1310 Dysphagia, unspecified: Secondary | ICD-10-CM

## 2017-05-18 LAB — COMPREHENSIVE METABOLIC PANEL
ALT: 86 U/L — ABNORMAL HIGH (ref 14–54)
AST: 71 U/L — ABNORMAL HIGH (ref 15–41)
Albumin: 2.5 g/dL — ABNORMAL LOW (ref 3.5–5.0)
Alkaline Phosphatase: 103 U/L (ref 38–126)
Anion gap: 5 (ref 5–15)
BUN: 16 mg/dL (ref 6–20)
CALCIUM: 11.4 mg/dL — AB (ref 8.9–10.3)
CO2: 31 mmol/L (ref 22–32)
CREATININE: 0.51 mg/dL (ref 0.44–1.00)
Chloride: 106 mmol/L (ref 101–111)
Glucose, Bld: 182 mg/dL — ABNORMAL HIGH (ref 65–99)
Potassium: 4.3 mmol/L (ref 3.5–5.1)
Sodium: 142 mmol/L (ref 135–145)
Total Bilirubin: 0.4 mg/dL (ref 0.3–1.2)
Total Protein: 5.1 g/dL — ABNORMAL LOW (ref 6.5–8.1)

## 2017-05-18 LAB — CBC
HCT: 37.3 % (ref 36.0–46.0)
Hemoglobin: 12 g/dL (ref 12.0–15.0)
MCH: 30.8 pg (ref 26.0–34.0)
MCHC: 32.2 g/dL (ref 30.0–36.0)
MCV: 95.6 fL (ref 78.0–100.0)
PLATELETS: 150 10*3/uL (ref 150–400)
RBC: 3.9 MIL/uL (ref 3.87–5.11)
RDW: 15.1 % (ref 11.5–15.5)
WBC: 6 10*3/uL (ref 4.0–10.5)

## 2017-05-18 LAB — GLUCOSE, CAPILLARY
GLUCOSE-CAPILLARY: 113 mg/dL — AB (ref 65–99)
GLUCOSE-CAPILLARY: 163 mg/dL — AB (ref 65–99)
GLUCOSE-CAPILLARY: 244 mg/dL — AB (ref 65–99)
Glucose-Capillary: 211 mg/dL — ABNORMAL HIGH (ref 65–99)
Glucose-Capillary: 232 mg/dL — ABNORMAL HIGH (ref 65–99)
Glucose-Capillary: 300 mg/dL — ABNORMAL HIGH (ref 65–99)

## 2017-05-18 MED ORDER — SODIUM CHLORIDE 0.9 % IV SOLN
INTRAVENOUS | Status: AC
  Administered 2017-05-18: 1 mL via INTRAVENOUS
  Administered 2017-05-19: 05:00:00 via INTRAVENOUS

## 2017-05-18 MED ORDER — OSMOLITE 1.2 CAL PO LIQD
1000.0000 mL | ORAL | Status: DC
  Administered 2017-05-18 – 2017-05-21 (×3): 1000 mL
  Filled 2017-05-18 (×7): qty 1000

## 2017-05-18 MED ORDER — SODIUM CHLORIDE 0.9 % IV SOLN
30.0000 mg | Freq: Once | INTRAVENOUS | Status: AC
  Administered 2017-05-18: 30 mg via INTRAVENOUS
  Filled 2017-05-18 (×2): qty 10

## 2017-05-18 MED ORDER — INSULIN ASPART 100 UNIT/ML ~~LOC~~ SOLN
0.0000 [IU] | SUBCUTANEOUS | Status: DC
  Administered 2017-05-18 – 2017-05-19 (×3): 5 [IU] via SUBCUTANEOUS
  Administered 2017-05-19: 2 [IU] via SUBCUTANEOUS
  Administered 2017-05-19: 7 [IU] via SUBCUTANEOUS
  Administered 2017-05-19: 3 [IU] via SUBCUTANEOUS
  Administered 2017-05-19 – 2017-05-20 (×2): 5 [IU] via SUBCUTANEOUS
  Administered 2017-05-20 (×2): 3 [IU] via SUBCUTANEOUS
  Administered 2017-05-20: 5 [IU] via SUBCUTANEOUS
  Administered 2017-05-20: 2 [IU] via SUBCUTANEOUS
  Administered 2017-05-20: 5 [IU] via SUBCUTANEOUS
  Administered 2017-05-21 (×2): 3 [IU] via SUBCUTANEOUS
  Administered 2017-05-21: 1 [IU] via SUBCUTANEOUS
  Administered 2017-05-21: 2 [IU] via SUBCUTANEOUS

## 2017-05-18 NOTE — Progress Notes (Signed)
Speech Language Pathology Treatment: Dysphagia  Patient Details Name: Rachel [name] MRN: [id number] DOB: 06/26/1950 Today's Date: 05/18/2017 Time: 4097-3532 SLP Time Calculation (min) (ACUTE ONLY): 11 min  Assessment / Plan / Recommendation Clinical Impression  Per MD note (although incomplete), pt's husband wants to initiate a trial of a modified diet - Dys 1 diet and nectar thick liquids were ordered by MD. MBS on previous date showed adequate oral function and airway protection with these textures when given in small amounts by spoon and when pt is alert. Today, pt is asleep upon SLP arrival, and does not seem to arouse despite Max multimodal cueing. SLP provided tactile and gustatory stimulation to pt's lips but without any attempts at oral acceptance. Education was provided to staff about the ongoing risk for aspiration given pt's altered mentation. I think her ability to obtain adequate nutrition and hydration PO is quite limited. If family is wanting to allow for comfort feeds, accepting the risk of aspiration, would continue to allow spoonfuls of puree/nectar thick liquids when optimally alert, also focusing on thorough oral care.   HPI HPI: 67 year old female with history of Rosai-Dorfman's disease as well as diabetes mellitus and schizophrenia presents with worsening right-sided weakness and aphasia.  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  Patient admitted for treatment with IV steroids and possible later surgical resection.  No history of seizure.  Patient has a history of blindness.  She is status post a remote occipital craniectomy and resection of extradural lesion years ago. MRI shows LEFT dural thickening with 2 dural-based masses again noted. Anterior temporal extra-axial solidly enhancing mass was 16 x 24 mm, now 15 x 21 mm. The more posterior mass was 0.7 x 1.2 cm,  now 2.5 x 3.3 cm corresponding to CT abnormality. Mass effect on LEFT temporal lobes with severe T2 bright presume vasogenic edema LEFT temporal frontal and parietal lobes including posterior limb of the internal capsule. Regional mass effect with 3 mm new LEFT-to-RIGHT midline shift. LEFT uncal herniation. Partial effacement LEFT lateral ventricle without RIGHT ventricle entrapment or, hydrocephalus. Hemosiderin staining mesial LEFT parietal lobe at site of prior hemorrhage. Numerous scattered chronic micro hemorrhages within supra-and infratentorial brain. Moderate to severe parenchymal brain volume loss. No abnormal extra-axial fluid collections. Pt had BSE earlier this admission on 3/13 with primarily cognitively-based dysphagia with limited oral acceptance of POs. With cueing and precautions, it was recommended that she started with sips of water only. Pt was then intubated 3/15 for L craniotomy. Extubation was attempted after the procedure but she required reintubation. Pt then remained intubated until one-way extubation 4/3.      SLP Plan  Continue with current plan of care       Recommendations  Diet recommendations: Dysphagia 1 (puree);Nectar-thick liquid(for comfort feeds) Liquids provided via: Teaspoon Medication Administration: Via alternative means Supervision: Staff to assist with self feeding;Full supervision/cueing for compensatory strategies Compensations: Slow rate;Small sips/bites Postural Changes and/or Swallow Maneuvers: Seated upright 90 degrees                Oral Care Recommendations: Oral care QID Follow up Recommendations: (tba) SLP Visit Diagnosis: Dysphagia, oropharyngeal phase (R13.12) Plan: Continue with current plan of care       GO                Germain Osgood 05/18/2017, 3:21 PM  Germain Osgood, M.A. CCC-SLP [phone]

## 2017-05-18 NOTE — Progress Notes (Addendum)
Nutrition Follow-up  DOCUMENTATION CODES:   Underweight, Severe malnutrition in context of chronic illness  INTERVENTION:   -Increase Osmolite 1.2 to 55 ml/hr via cortrak tube  Tube feeding regimen provides 1584 kcal (100% of needs), 73 grams of protein, and 1082 ml of H2O.   -Continue 15 ml liquid MVI daily  -If TF continues, consider replacement of cortrak tube (by 05/25/17- manufacturer recommends replacement after 30 days of placement) or pursuing permanent feeding access (ex PEG)  NUTRITION DIAGNOSIS:   Severe Malnutrition related to chronic illness(brain tumor) as evidenced by severe fat depletion, severe muscle depletion, percent weight loss.  Ongoing  GOAL:   Patient will meet greater than or equal to 90% of their needs  Progressing  MONITOR:   Weight trends, TF tolerance, Labs, I & O's  REASON FOR ASSESSMENT:   Ventilator, Consult Enteral/tube feeding initiation and management  ASSESSMENT:   67 y/o F who presented 3/11 with a 2 day history of worsening right sided weakness. 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 Post procedure, she failed extubation and required re-intubation. PMH of blindness, DM type 1, GERD, hyperlipidemia, schizophrenia, SAH, HTN, and Rosai Dorfman. At baseline pt dependent for all ADL's.  3/15 4/3 intubated 3/16 Cortrak  Case discussed with RNCM; husband trying to decide on whther to pursue PEG. LTACH referrals also being placed per medical director's recommendation.   Case discussed with RN, who reports pt tolerating TF well. Confirmed plans for possible PEG and LTACH placement.   Pt lethargic at time of visit; pt would not respond to voice or touch. Spoke with pt husband at bedside, who reports intermittent episodes of pt alertness. He reports these instances are sporadic, but she consumed an entire cup of applesauce yesterday, which she enjoyed. Pt underwent MBSS  yesterday and recommended dysphagia 1 diet with nectar thick liquids when alert. Pt husband is eager for pt to start trials of PO's.   Reviewed I/O's: +54 ml x 24 hours and +9.5 L since 05/04/17. Noted 13.5% wt loss over the past week, 21/5% wt loss over the past month, both significant for time frame.   Osmolite 1.2 infusing via cortrak at 50 ml/hr, which provides 1440 kcals, 67 grams protein and 973 ml free water daily. Regimen meets 99% of estimated kcal needs and 87% of estimated protein needs. Will increase TF rate secondary to continued wt loss and no planned decision to move towards comfort care at this point.   Labs reviewed: CBGS: [id number] (inpatient orders for glycemic control 1-3 units insulin aspart every 4 hours and 10 units insulin glargine daily at bedtime).   NUTRITION - FOCUSED PHYSICAL EXAM:    Most Recent Value  Orbital Region  Severe depletion  Upper Arm Region  Severe depletion  Thoracic and Lumbar Region  Moderate depletion  Buccal Region  Severe depletion  Temple Region  Moderate depletion  Clavicle Bone Region  Severe depletion  Clavicle and Acromion Bone Region  Severe depletion  Scapular Bone Region  Severe depletion  Dorsal Hand  Severe depletion  Patellar Region  Severe depletion  Anterior Thigh Region  Severe depletion  Posterior Calf Region  Moderate depletion  Edema (RD Assessment)  None  Hair  Reviewed  Eyes  Reviewed  Mouth  Reviewed  Skin  Reviewed  Nails  Reviewed       Diet Order:  Fall precautions Seizure precautions Aspiration precautions DIET - DYS 1 Room  service appropriate? Yes; Fluid consistency: Nectar Thick  EDUCATION NEEDS:   Not appropriate for education at this time  Skin:  Skin Assessment: Skin Integrity Issues: Skin Integrity Issues:: Stage III Stage II: progressed to stage III Stage III: sacrum Incisions: head  Last BM:  05/18/17  Height:   Ht Readings from Last 1 Encounters:  05/04/17 5\' 6"  (1.676 m)    Weight:    Wt Readings from Last 1 Encounters:  05/18/17 102 lb 4.7 oz (46.4 kg)    Ideal Body Weight:  59.1 kg  BMI:  Body mass index is 16.51 kg/m.  Estimated Nutritional Needs:   Kcal:  1450-1650  Protein:  70-85 grams  Fluid:  > 1.4 L    Avigail Pilling A. Jimmye Norman, RD, LDN, CDE Pager: [phone] After hours Pager: [phone]

## 2017-05-18 NOTE — Care Management Note (Signed)
Case Management Note  Patient Details  Name: Rachel [name] MRN: [id number] Date of Birth: 25-Mar-1950  Subjective/Objective:                    Action/Plan:  Patient discussed in length of Stay Meetings this morning. Dr Reynaldo Minium suggested [place] referrals. Discussed same with Vinie Sill NP and Dr Chauncey Cruel. Nettey , both in agreement.   Discussed [facility] and [facility] ) to patient's husband [name], who voiced understanding and agreement. Referrals made to [facility] and [facility]. Expected Discharge Date:                  Expected Discharge Plan:  Long Term Acute Care (LTAC)  In-House Referral:  Nutrition, Hospice / Palliative Care  Discharge planning Services  CM Consult  Post Acute Care Choice:  NA Choice offered to:  Spouse  DME Arranged:  N/A DME Agency:  NA  HH Arranged:  NA HH Agency:  NA  Status of Service:  In process, will continue to follow  If discussed at Long Length of Stay Meetings, dates discussed:  05-18-17 Additional Comments:  Marilu Favre, RN 05/18/2017, 10:43 AM

## 2017-05-18 NOTE — Progress Notes (Signed)
PROGRESS NOTE  Rachel Rachel [name]:[id number] DOB: [birth year] DOA: 04/19/2017 PCP: Rachel Beard, MD  HPI/Recap of past 24 hours:  Rachel Rachel [name] is a 67 y.o. year old female with medical history significant for Rachel's Rachel [name] with remote occipital craniectomy and resection of extradural lesions, T2DM, PAF, OSA(not on CPAP), HTN, schizophrenia,  who presented on 04/19/2017 with a 2 day history of right sided weakness and aphasia and was found to have increased size of known left sided extra dural masses with vasogenic edema on CT head imaging in ED evaluation. Pt is s/p craniectomy with microdissection on 3/15.     Hospital Course Extra-axial masses consistent with meningioma vs lymphoproliferative tissue associated with her Rachel Rachel [name]. Initially attempted stabilization with IV decadron then underwent elective left craniotomy for resection of tumors with microdissection on 3/15 by NSGY. She failed post procedure extubation due to increased somnolence and inability to protect airway.  Keppra dosing was increased due to persistent poor responsiveness while intubated though EEG showed no seizure activity. Repeat CT imaging showed no acute changes with slight improvement in left cerebral vasogenic edema and similar left to right midline shift. Rachel Rachel [name] recommended continuing AEDs and decadron with concern swelling was likely culprit of her encephalopathy.  MRI Brain on 3/20 consistent with scattered embolic strokes of right hemisphere, Rachel Rachel [name] believed these findings were incidental and not contributing to her poor mentation. Given her poor baseline prior to admission (dependent in ADLs and IADLs) believed this would be a very slow process for potential improvement and recommended Rachel Rachel [name]'s involvement.    4/3 after husband spoke with Rachel Rachel [name] care team decided for one extubation with plan for DNR and no reintubation with plan to transition to comfort care if she was unable to  breathe on her own.  She was extubated to 2 L Rachel Rachel [name] with no complications.  Family asked for restarting tube feeding and medications to given her a chance to see if she could recover further prior to transfer to [name] for further management.   Subjective Husband at bedside. Patient is sleeping. No acute events overnight. Patient opens eyes slightly but no other interaction with me.   Spoke with Rachel Rachel [name] who wants to do trial of dysphagia diet after recent Rachel [name] evaluation.   Assessment/Plan: Active Problems:   HTN (hypertension)   PAF (paroxysmal atrial fibrillation) (HCC)   Rosai-Dorfman Rachel [name] (HCC)   Diabetes mellitus type 2 in nonobese (HCC)   Acute encephalopathy   Hypokalemia   Brain edema (HCC)   Neoplasm of brain causing mass effect on adjacent structures (HCC)   Hypercalcemia   Pressure injury of skin   Protein-calorie malnutrition, severe   Failure to wean from mechanical ventilation (HCC)   Goals of care, counseling/discussion   Rachel Rachel [name] care encounter   Rachel Rachel [name] care by specialist   Dysphagia  # Hypercalcemia related to [facility] and dehydration, worsening - Peak of 11.8 during admission, getting worse again. Definitely not helping with mental status thought not suspected to be main driver - Trend since arrival to floor: 10.4--10.5--10.9--11.2--11.4 - has not improved with timed IVF, has had pamidronate during hospitalization w/improvement ( 3/12) - increased IVF 125 cc/hr and give pamidronate x1, repeat this evening  #Enlarging left sided extradural masses with vasogenic edema, stable -S/p tumors resection/craniectomy on 3/15.  -Unchanged mass effect and edema on MRI from 3/20. -Presumed tumors attributed to Rosai-Dorfman Rachel [name].  -Continue IV keppra for prophylaxis  #Encephalopathy, stable  -most likely multifactorial etiology: Related to vasogenic edema and midline  shift due to above,  - throughout day will open eyes to voice, and move extremities  but not interactive beyond that - Rachel Rachel [name] care discussion with husband given poor prognosis to regain increased cognitive function have been slowly progressing, he thinks she is doing better - continue Ativan 0.5 mg every 4 as needed. PRN IV morphine for severe pain, Iv antiemetics  #Dysphagia, stable - cognitively based, also exacerbated with intubation - Rachel [name] evaluation on 4/7 and MBS on 4/8: Dysphagia 1 diet with close supervision - discussed with husband still some concern for aspiration; he understands risk of aspiration and confirmed DNR status and wants to pursue oral trials - On Tube Feeds via cortrak tube - Shared with husband low likelihood she could maintain nutrition on oral alone and temporary feeding tube, he thinks she just needs more time and does not say if he would agree to a PEG tube -Patient prognosis seems poor, myself and Rachel Rachel [name] medicine team have tried daily to pursue goals of care discussion with husband  #Paroxysmal Atrial Fibrillation, currently rate controlled - per new Rachel [name] recs, can try administering medications crushed with puree - will continue to hold home diltiazem, as has remained at normal rate without meds  #Acute respiratory failure related to encephalopathy due to above,resolved. - Stable on room air -s/p one way extubation on 4/3 in ICU  - Previously required intubation shortly after craniectomy on 3/15   #Schizophrenia/anxiety/depression - home trazodone, risperidone and paxil via feeding tube - PRN IV ativan to prevent benzo withdrawal in setting of chronic benzodiazepine since day of admission  #T2DM- A1c 7.4%(04/2017),  -lantus 10 U, sliding scale insulin while on tube feeds  Code Status: DNR  Family Communication: No family at bedside  Disposition Plan:  Continue goals of care discussion with husband, he wants to try dysphagia diet and see how she tolerates aware of risk for aspiration,  appreciate Rachel Rachel [name] medicine help, C/M  working with husband on LTAC referral [facility]  Consultants:  Rachel [name], Rachel [name], Rachel Rachel [name], Rachel Rachel [name], Rachel [name]  Procedures:  Craniectomy 3/15   EEG 3/19  TTE 3/21, EF 65-70%, moderate LVH  Antimicrobials:  none  Cultures:  none  Telemetry:yes  DVT prophylaxis: SCDs  Objective: Vitals:   05/17/17 1257 05/17/17 2213 05/18/17 0440 05/18/17 0500  BP:  123/89 133/77   Pulse:  92 83   Resp:  16 16   Temp:  98.9 F (37.2 C) 98.7 F (37.1 C)   TempSrc:  Oral Oral   SpO2:   100%   Weight: 45.9 kg (101 lb 3.1 oz)   46.4 kg (102 lb 4.7 oz)  Height:        Intake/Output Summary (Last 24 hours) at 05/18/2017 1353 Last data filed at 05/18/2017 1249 Gross per 24 hour  Intake 928.75 ml  Output 1550 ml  Net -621.25 ml   Filed Weights   05/16/17 0631 05/17/17 1257 05/18/17 0500  Weight: 51.1 kg (112 lb 10.5 oz) 45.9 kg (101 lb 3.1 oz) 46.4 kg (102 lb 4.7 oz)    Exam:  Constitutional:cachetic, chronically ill appearing female lying in bed Eyes: eyes half open Cardiovascular: RRR no MRGs, with no peripheral edema Respiratory: Normal respiratory effort on room air, clear breath sounds  Abdomen: Soft,non-tender Skin: No rash ulcers, or lesions. Without skin tenting  Neurologic:  moves left and right arm and leg sporadically, not following commands  Data Reviewed: CBC: Recent Labs  Lab 05/12/17 0354 05/18/17 0744  WBC 7.2 6.0  HGB  10.2* 12.0  HCT 31.0* 37.3  MCV 92.5 95.6  PLT 136* 161   Basic Metabolic Panel: Recent Labs  Lab 05/12/17 0354 05/13/17 1732 05/16/17 0503 05/17/17 0712 05/18/17 0744  NA 138  --   --   --  142  K 3.9  --   --   --  4.3  CL 106  --   --   --  106  CO2 24  --   --   --  31  GLUCOSE 148*  --   --   --  182*  BUN 23*  --   --   --  16  CREATININE 0.40*  --   --   --  0.51  CALCIUM 10.4* 10.5* 10.9* 11.2* 11.4*  MG 2.2  --   --   --   --   PHOS 2.5  --   --   --   --    GFR: Estimated Creatinine Clearance: 52 mL/min (by  C-G formula based on SCr of 0.51 mg/dL). Liver Function Tests: Recent Labs  Lab 05/18/17 0744  AST 71*  ALT 86*  ALKPHOS 103  BILITOT 0.4  PROT 5.1*  ALBUMIN 2.5*   No results for input(s): LIPASE, AMYLASE in the last 168 hours. No results for input(s): AMMONIA in the last 168 hours. Coagulation Profile: No results for input(s): INR, PROTIME in the last 168 hours. Cardiac Enzymes: No results for input(s): CKTOTAL, CKMB, CKMBINDEX, TROPONINI in the last 168 hours. BNP (last 3 results) No results for input(s): PROBNP in the last 8760 hours. HbA1C: No results for input(s): HGBA1C in the last 72 hours. CBG: Recent Labs  Lab 05/17/17 2007 05/18/17 0024 05/18/17 0428 05/18/17 0756 05/18/17 1236  GLUCAP 277* 211* 113* 163* 244*   Lipid Profile: No results for input(s): CHOL, HDL, LDLCALC, TRIG, CHOLHDL, LDLDIRECT in the last 72 hours. Thyroid Function Tests: No results for input(s): TSH, T4TOTAL, FREET4, T3FREE, THYROIDAB in the last 72 hours. Anemia Panel: No results for input(s): VITAMINB12, FOLATE, FERRITIN, TIBC, IRON, RETICCTPCT in the last 72 hours. Urine analysis:    Component Value Date/Time   COLORURINE YELLOW 04/19/2017 2252   APPEARANCEUR HAZY (A) 04/19/2017 2252   LABSPEC 1.008 04/19/2017 2252   PHURINE 7.0 04/19/2017 2252   GLUCOSEU NEGATIVE 04/19/2017 2252   HGBUR NEGATIVE 04/19/2017 2252   BILIRUBINUR NEGATIVE 04/19/2017 2252   KETONESUR 5 (A) 04/19/2017 2252   PROTEINUR NEGATIVE 04/19/2017 2252   UROBILINOGEN 0.2 12/04/2013 1518   NITRITE NEGATIVE 04/19/2017 2252   LEUKOCYTESUR NEGATIVE 04/19/2017 2252   Sepsis Labs: _0 (procalcitonin:4,lacticidven:4)  )No results found for this or any previous visit (from the past 240 hour(s)).    Studies: Dg Swallowing Func-[name] Pathology  Result Date: 05/17/2017 Objective Swallowing Evaluation: Type of Study: MBS-Modified Barium Swallow Study  Patient Details Name: CAITRIN PENDERGRAPH MRN: [id number]  Date of Birth: [birth year] Today's Date: 05/17/2017 Time: SLP Start Time (ACUTE ONLY): 1340 -SLP Stop Time (ACUTE ONLY): 1405 SLP Time Calculation (min) (ACUTE ONLY): 25 min Past Medical History: Past Medical History: Diagnosis Date . Blindness  . Diabetes mellitus type I ([place])  . GERD (gastroesophageal reflux Rachel [name])  . Hyperlipidemia  . Paroxysmal atrial fibrillation (HCC)  . Schizophrenia ([place])  . Sleep apnea  . Subarachnoid hemorrhage ([place])  . Systemic hypertension  . Vertigo  Past Surgical History: Past Surgical History: Procedure Laterality Date . BRAIN TUMOR EXCISION   . CRANIOTOMY Left 04/23/2017  Procedure: CRANIOTOMY TUMOR EXCISION;  Surgeon: Earnie Larsson,  MD;  Location: [place];  Service: Neurosurgery;  Laterality: Left;  left . NM MYOCAR PERF WALL MOTION  02/01/2007  no significant ischemia . US ECHOCARDIOGRAPHY  12/20/2003  mild mitral annular ca+,mild MR,TR,PI,AOV mildly sclerotic HPI: 68 year old female with history of Rosai-Dorfman's Rachel [name] as well as diabetes mellitus and schizophrenia presents with worsening right-sided weakness and aphasia.  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  Patient admitted for treatment with IV steroids and possible later surgical resection.  No history of seizure.  Patient has a history of blindness.  She is status post a remote occipital craniectomy and resection of extradural lesion years ago. MRI shows LEFT dural thickening with 2 dural-based masses again noted. Anterior temporal extra-axial solidly enhancing mass was 16 x 24 mm, now 15 x 21 mm. The more posterior mass was 0.7 x 1.2 cm, now 2.5 x 3.3 cm corresponding to CT abnormality. Mass effect on LEFT temporal lobes with severe T2 bright presume vasogenic edema LEFT temporal frontal and parietal lobes including posterior limb of the internal capsule. Regional mass effect with 3 mm new LEFT-to-RIGHT midline  shift. LEFT uncal herniation. Partial effacement LEFT lateral ventricle without RIGHT ventricle entrapment or, hydrocephalus. Hemosiderin staining mesial LEFT parietal lobe at site of prior hemorrhage. Numerous scattered chronic micro hemorrhages within supra-and infratentorial brain. Moderate to severe parenchymal brain volume loss. No abnormal extra-axial fluid collections. Pt had BSE earlier this admission on 3/13 with primarily cognitively-based dysphagia with limited oral acceptance of POs. With cueing and precautions, it was recommended that she started with sips of water only. Pt was then intubated 3/15 for L craniotomy. Extubation was attempted after the procedure but she required reintubation. Pt then remained intubated until one-way extubation 4/3.  Subjective: pt is alert and keeps her eyes open but makes no attempts to speak Assessment / Plan / Recommendation CHL IP CLINICAL IMPRESSIONS 05/17/2017 Clinical Impression Patient presents with a moderate oropharyngeal dysphagia, exacerbated by acute changes in mentation. Patient initially asleep, requiring max verbal and tactile cueing to arouse enough for po intake, including completion of oral care. Once aroused however, and despite maintaining eyes closed throughout exam, patient willingly opens mouth for intake of bolus with gentle tactile cue to bottom lip. Oral transit then largely normal with both pureed solids and liquids with only intermittently delayed oral transit. Expectoration of soft solid bolus noted. Patient able to protect airway with pureed solids and nectar thick liquids via tsp, penetrating deeply and with question of aspiration (difficult to view given poor posture) of nectar thick and thin liquid via straw. Only subtle, non-productive throat clear in response.  Unfortunately, patient unable to take cup sips due to combination of poor head control and decreased awareness. Additionally, mild residuals remain post swallow. Although mentation  significantly increases risk of aspiration, if family wishes, would consider initiation of pureed solids and nectar thick liquids via spoon when alert. SLP will f/u.  SLP Visit Diagnosis Dysphagia, oropharyngeal phase (R13.12) Attention and concentration deficit following -- Frontal lobe and executive function deficit following -- Impact on safety and function Moderate aspiration risk;Severe aspiration risk   CHL IP TREATMENT RECOMMENDATION 05/17/2017 Treatment Recommendations Therapy as outlined in treatment plan below   Prognosis 05/17/2017 Prognosis for Safe Diet Advancement Fair Barriers to Reach Goals Cognitive deficits;Other (Comment) Barriers/Prognosis Comment -- CHL IP DIET RECOMMENDATION 05/17/2017 SLP Diet Recommendations Dysphagia 1 (Puree) solids;Nectar thick liquid Liquid Administration  via Spoon Medication Administration Crushed with puree Compensations Slow rate;Small sips/bites Postural Changes Seated upright at 90 degrees   CHL IP OTHER RECOMMENDATIONS 05/17/2017 Recommended Consults -- Oral Care Recommendations Oral care BID Other Recommendations Order thickener from pharmacy;Prohibited food (jello, ice cream, thin soups);Remove water pitcher   CHL IP FOLLOW UP RECOMMENDATIONS 05/17/2017 Follow up Recommendations (No Data)   CHL IP FREQUENCY AND DURATION 05/17/2017 [name] Therapy Frequency (ACUTE ONLY) min 3x week Treatment Duration 2 weeks      CHL IP ORAL PHASE 05/17/2017 Oral Phase WFL Oral - Pudding Teaspoon -- Oral - Pudding Cup -- Oral - Honey Teaspoon -- Oral - Honey Cup -- Oral - Nectar Teaspoon -- Oral - Nectar Cup -- Oral - Nectar Straw -- Oral - Thin Teaspoon -- Oral - Thin Cup -- Oral - Thin Straw -- Oral - Puree -- Oral - Mech Soft -- Oral - Regular -- Oral - Multi-Consistency -- Oral - Pill -- Oral Phase - Comment --  CHL IP PHARYNGEAL PHASE 05/17/2017 Pharyngeal Phase Impaired Pharyngeal- Pudding Teaspoon -- Pharyngeal -- Pharyngeal- Pudding Cup -- Pharyngeal -- Pharyngeal- Honey Teaspoon Delayed  swallow initiation-vallecula;Reduced tongue base retraction;Pharyngeal residue - valleculae Pharyngeal -- Pharyngeal- Honey Cup -- Pharyngeal -- Pharyngeal- Nectar Teaspoon Delayed swallow initiation-vallecula;Reduced tongue base retraction;Pharyngeal residue - valleculae Pharyngeal -- Pharyngeal- Nectar Cup -- Pharyngeal -- Pharyngeal- Nectar Straw Delayed swallow initiation-vallecula;Reduced tongue base retraction;Pharyngeal residue - valleculae;Penetration/Aspiration during swallow Pharyngeal Material enters airway, remains ABOVE vocal cords and not ejected out Pharyngeal- Thin Teaspoon Delayed swallow initiation-pyriform sinuses;Reduced tongue base retraction;Pharyngeal residue - valleculae Pharyngeal -- Pharyngeal- Thin Cup -- Pharyngeal -- Pharyngeal- Thin Straw Delayed swallow initiation-vallecula;Reduced tongue base retraction;Pharyngeal residue - valleculae;Penetration/Aspiration during swallow Pharyngeal Material enters airway, CONTACTS cords and not ejected out Pharyngeal- Puree Delayed swallow initiation-vallecula;Reduced tongue base retraction;Pharyngeal residue - valleculae Pharyngeal -- Pharyngeal- Mechanical Soft -- Pharyngeal -- Pharyngeal- Regular -- Pharyngeal -- Pharyngeal- Multi-consistency -- Pharyngeal -- Pharyngeal- Pill -- Pharyngeal -- Pharyngeal Comment --  Gabriel Rainwater MA, CCC-SLP [phone] [place] Leah Meryl 05/17/2017, 2:41 PM               Scheduled Meds: . chlorhexidine gluconate (MEDLINE KIT)  15 mL Mouth Rinse BID  . furosemide  10 mg Intravenous Q12H  . heparin injection (subcutaneous)  5,000 Units Subcutaneous Q12H  . insulin aspart  1-3 Units Subcutaneous Q4H  . insulin glargine  10 Units Subcutaneous QHS  . multivitamin  15 mL Per Tube Daily  . pantoprazole sodium  40 mg Per Tube Daily  . PARoxetine  40 mg Per Tube Daily  . polyethylene glycol  17 g Per Tube Daily  . potassium chloride  20 mEq Per Tube Q12H  . risperiDONE  2 mg Per Tube QHS    Continuous  Infusions: . sodium chloride 75 mL/hr at 05/17/17 2223  . feeding supplement (OSMOLITE 1.2 CAL) 1,000 mL (05/16/17 1830)  . levETIRAcetam Stopped (05/18/17 0612)     LOS: 29 days     Desiree Hane, MD [facility] Pager [phone]  If 7PM-7AM, please contact night-coverage [email or web address] Password [email or web address] 05/18/2017, 1:53 PM

## 2017-05-18 NOTE — Progress Notes (Addendum)
Palliative:  I met today at Rachel [name]'s bedside with Rachel [name]. Her eyes are open but does not track/focus or follow any commands. Rachel [name] still hopeful for improvement and believes she is improving slowly. We focused discussion today on diet and goals for nutrition. I explained risk of aspiration would always be very high for Rachel [name] given her decreased alertness. I explained also that I worry about her ability to eat enough to sustain her. Encouraged trial of po for [place] to see how she does and give her pleasure of tasting food/drink. He agrees and desires this as well. I also reiterated that we need to start to plan how to handle nutrition if this does not go well (i.e. Comfort feeds vs PEG). Rachel [name] does appear comfortable and at ease with no signs of discomfort or distress.   We also discussed potential of LTAC and his thoughts on this move. He agrees that this could be good to give them more time.   I did not mention today as Rachel [name] seemed more distant today and even less inclined to engage in conversation. Perhaps he is tired of not hearing good news. I do remember when he made the decision to extubate he clearly stated "no more cutting" and I wonder if this would apply to feeding tube/PEG? Would need palliative follow up at [facility] if she is approved.   25 min  Vinie Sill, NP Palliative Medicine Team Pager # [phone] (M-F 8a-5p) Team Phone # [phone] (Nights/Weekends)

## 2017-05-18 NOTE — Progress Notes (Signed)
Palliative:  Ms. Recktenwald is snoring and asleep when I enter. I did not awaken her. Husband, [name], is at bedside. [name] tells me that he feels she is slowly getting better. I asked about responsiveness and he tells me that she tries to talk to him. He continues to say he believes she has her days and nights confused. Unfortunately this improvement in responsiveness has not been witnessed by the medical team.   We speak more about the challenges ahead in particular with feeding and nutrition. We discussed the Cortrak as temporary and the findings of MBS. [name] shares that she enjoyed the applesauce and wanted to finish all of it. Discussed that per SLP note Ms. Strong did accept and appeared to be able to swallow on assessment. I did stress to him that if we let her eat that she remains at very high risk for aspiration at any time due to her altered mentation. I also stressed my concern at her ability to meet her nutritional needs with po intake. I also discussed longer term feeding tube/PEG but emphasized that this would provide calories and nutrition to her but will not be pleasurable to her at all (important based on past discussion of her desire for applesauce and love for her family members potato salad and red velvet cake). He seems to understand but gives no indication to his wishes at this time. Would recommend trial of po if Johnny accepting of risks. Will further discuss with him tomorrow.   20 min  Vinie Sill, NP Palliative Medicine Team Pager # [phone] (M-F 8a-5p) Team Phone # [phone] (Nights/Weekends)

## 2017-05-19 LAB — CBC WITH DIFFERENTIAL/PLATELET
BASOS PCT: 0 %
Basophils Absolute: 0 10*3/uL (ref 0.0–0.1)
EOS ABS: 0.2 10*3/uL (ref 0.0–0.7)
Eosinophils Relative: 3 %
HCT: 33.4 % — ABNORMAL LOW (ref 36.0–46.0)
HEMOGLOBIN: 10.8 g/dL — AB (ref 12.0–15.0)
Lymphocytes Relative: 36 %
Lymphs Abs: 1.9 10*3/uL (ref 0.7–4.0)
MCH: 31.5 pg (ref 26.0–34.0)
MCHC: 32.3 g/dL (ref 30.0–36.0)
MCV: 97.4 fL (ref 78.0–100.0)
Monocytes Absolute: 0.5 10*3/uL (ref 0.1–1.0)
Monocytes Relative: 10 %
NEUTROS PCT: 51 %
Neutro Abs: 2.8 10*3/uL (ref 1.7–7.7)
Platelets: 164 10*3/uL (ref 150–400)
RBC: 3.43 MIL/uL — ABNORMAL LOW (ref 3.87–5.11)
RDW: 15.3 % (ref 11.5–15.5)
WBC: 5.4 10*3/uL (ref 4.0–10.5)

## 2017-05-19 LAB — COMPREHENSIVE METABOLIC PANEL
ALBUMIN: 2.3 g/dL — AB (ref 3.5–5.0)
ALK PHOS: 85 U/L (ref 38–126)
ALT: 61 U/L — AB (ref 14–54)
ANION GAP: 6 (ref 5–15)
AST: 26 U/L (ref 15–41)
BUN: 18 mg/dL (ref 6–20)
CALCIUM: 10.2 mg/dL (ref 8.9–10.3)
CHLORIDE: 111 mmol/L (ref 101–111)
CO2: 28 mmol/L (ref 22–32)
Creatinine, Ser: 0.58 mg/dL (ref 0.44–1.00)
GFR calc Af Amer: >60 mL/min (ref >60)
GFR calc non Af Amer: >60 mL/min (ref >60)
GLUCOSE: 217 mg/dL — AB (ref 65–99)
Potassium: 4 mmol/L (ref 3.5–5.1)
SODIUM: 145 mmol/L (ref 135–145)
Total Bilirubin: 0.4 mg/dL (ref 0.3–1.2)
Total Protein: 4.8 g/dL — ABNORMAL LOW (ref 6.5–8.1)

## 2017-05-19 LAB — MAGNESIUM: Magnesium: 1.9 mg/dL (ref 1.7–2.4)

## 2017-05-19 LAB — GLUCOSE, CAPILLARY
GLUCOSE-CAPILLARY: 248 mg/dL — AB (ref 65–99)
GLUCOSE-CAPILLARY: 274 mg/dL — AB (ref 65–99)
GLUCOSE-CAPILLARY: 317 mg/dL — AB (ref 65–99)
Glucose-Capillary: 256 mg/dL — ABNORMAL HIGH (ref 65–99)
Glucose-Capillary: 192 mg/dL — ABNORMAL HIGH (ref 65–99)
Glucose-Capillary: 281 mg/dL — ABNORMAL HIGH (ref 65–99)

## 2017-05-19 LAB — PHOSPHORUS: Phosphorus: 2.1 mg/dL — ABNORMAL LOW (ref 2.5–4.6)

## 2017-05-19 MED ORDER — INSULIN GLARGINE 100 UNIT/ML ~~LOC~~ SOLN
15.0000 [IU] | Freq: Every day | SUBCUTANEOUS | Status: DC
  Administered 2017-05-19 – 2017-05-20 (×2): 15 [IU] via SUBCUTANEOUS
  Filled 2017-05-19 (×3): qty 0.15

## 2017-05-19 NOTE — Progress Notes (Signed)
Palliative:  Ms. Scheier is propped up in bed. Eyes slightly open but not to command, appears comfortable and without distress. Johnny at bedside. Her breakfast tray is 75% gone and Johnny fed her and says she did well. Said she coughed "just a little" a couple times. Updated that she would not eat for SLP yesterday and he laughs and says that what he was told.   We spoke more about continuing with feeding trial knowing the risks of aspiration for her and fluctuating intake. I addressed PEG if feeding trial does not go well in context of another surgery and that he has previously told me "no more cutting." Given these goals I recommended continuing trials for nutrition as well as for comfort and QOL and to follow her lead. Recommended against PEG as this would not add QOL for her. Johnny nods head yes but still unclear if he would for sure not desire PEG BUT PEG placement would not align with previously stated GOC.   Also recommend d/c Cortrak as this has been in place since 09/22/86.  20 min  Vinie Sill, NP Palliative Medicine Team Pager # [phone] (M-F 8a-5p) Team Phone # [phone] (Nights/Weekends)

## 2017-05-19 NOTE — Progress Notes (Signed)
PROGRESS NOTE    Rachel [name]  XTG:[id number] DOB: [birth year] DOA: 04/19/2017 PCP: Iona Beard, MD   Brief Narrative:  Rachel [name] is a 67 y.o. 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 Pt is s/p craniectomy with microdissection on 3/15.     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 Initially attempted stabilization with IV decadron then underwent elective left craniotomy for resection of tumors with microdissection on 3/15 by Neurosurgery. She failed post procedure extubation due to increased somnolence and inability to protect airway.  Keppra dosing was increased due to persistent poor responsiveness while intubated though EEG showed no seizure activity. Repeat CT imaging showed no acute changes with slight improvement in left cerebral vasogenic edema and similar left to right midline shift. Neurology recommended continuing AEDs and decadron with concern swelling was likely culprit of her encephalopathy.  MRI Brain on 3/20 consistent with scattered embolic strokes of right hemisphere, neurology believed these findings were incidental and not contributing to her poor mentation. Given her poor baseline prior to admission (dependent in ADLs and IADLs) believed this would be a very slow process for potential improvement and recommended palliative's involvement.    4/3 after husband spoke with palliative care team decided for one extubation with plan for DNR and no reintubation with plan to transition to comfort care if she was unable  to breathe on her own.  She was extubated to 2 L [place] with no complications.  Family asked for restarting tube feeding and medications to given her a chance to see if she could recover further prior to transfer to [facility] for further management.  Patient's husband working with [facility] and now attempting Feeding trials and is on Dysphagia 1 Diet with Nectar Thick Liquids. Still has Cortrak in place currently Palliative recommending discontinuing Cortrak and attempting feed patient but husband still unclear about PEG placement. She has been offered a bed at [facility].   Assessment & Plan:   Active Problems:   HTN (hypertension)   PAF (paroxysmal atrial fibrillation) (HCC)   Rosai-Dorfman disease (HCC)   Diabetes mellitus type 2 in nonobese (HCC)   Acute encephalopathy   Hypokalemia   Brain edema (HCC)   Neoplasm of brain causing mass effect on adjacent structures (HCC)   Hypercalcemia   Pressure injury of skin   Protein-calorie malnutrition, severe   Failure to wean from mechanical ventilation (HCC)   Goals of care, counseling/discussion   Palliative care encounter   Palliative care by specialist   Dysphagia  Hypercalcemia related to [facility] and dehydration, improved slightly  -Peak of 11.8 during admission, getting worse again. Definitely not helping with mental status thought not suspected to be main driver -Trend since arrival to floor: 10.4 -> 10.5 -> 10.9 -> 11.2 -> 11.4 -> 10.2 -Did not improve with IVF, has had pamidronate during hospitalization w/improvement ( 3/12) and repeated yesterday  -IVF with NS at 125 mL/hr discontinued this Afternoon  -C/w IV Lasix 10 mg q12h   Enlarging left sided extradural masses with vasogenic edema, stable -S/p tumors resection/craniectomy on 3/15.  -  Unchanged mass effect and edema on MRI from 3/20. -Presumed tumors attributed to Rosai-Dorfman disease.  -Continue IV Levitiracetam 500 mg q12h for  prophylaxis  Encephalopathy, stable  -Most likely multifactorial etiology: Related to vasogenic edema and midline shift due to above,  -Throughout day will open eyes to voice, and move extremities but not interactive beyond that -Palliative care discussion with husband given poor prognosis to regain increased cognitive function have been slowly progressing, he thinks she is doing better -Continue Ativan 0.5 mg every 4 as needed. PRN IV Morphine 2 mg q2hprn for severe pain, IV Antiemetics with po/IV Zofran 4 mg q6h  Dysphagia, stable -Cognitively based, also exacerbated with intubation -Speech evaluation on 4/7 and MBS on 4/8: Dysphagia 1 diet with close supervision -Discussed with husband still some concern for aspiration; he understands risk of aspiration and confirmed DNR status and wants to pursue oral trials -On Tube Feeds via cortrak tube and if continues Cortrak to be replaced by 4/161/9 -Shared with husband low likelihood she could maintain nutrition on oral alone and temporary feeding tube, he thinks she just needs more time and does not say if he would agree to a PEG tube and still undecided  -Palliative involved in [place]  -C/w Oral Care with Chlorhexidine 15 mL Mouth Rinse BID  Paroxysmal Atrial Fibrillation,  -Currently rate controlled -Per new speech recs, can try administering medications crushed with puree -Will continue to hold home diltiazem, as has remained at normal rate without meds  Acute Respiratory Failure related to Encephalopathy due to above,resolved. -Stable on room air -s/p one way extubation on 4/3 in ICU  -Previously required intubation shortly after craniectomy on 3/15  -C/w Albuterol 2.5 mg Neb q4hprn Wheezing/SOB  Schizophrenia/Anxiety/Depression -Home Trazodone 50 mg per Tube, Risperidone 2 mg per Tube qHS and Paroxetine 40 mg per Tube via Cortrak feeding tube -C/w PRN IV Lorazepam 0.5 mg q4hprn Anxiety to prevent benzo withdrawal in setting of chronic  benzodiazepine since day of admission  T2DM- A1c 7.4%(04/2017),  -Lantus 10 units increased to 15 units sq qHS -C/w Sensitive Novolog SSI q4h -CBG's ranging from 192-300 -HbA1c was 7.4 on 04/20/17  Severe Malnutrition in the Context of Chronic Illness -Nutritionist consulted and appreciate Recc's -C/w Osmolite 1.2 to 55 mL/hr via Cortrak and C/w 15 mL Liquid MVI Daily -If TF Continues will need to replace Cortrak by 05/25/17 or discuss PEG option with Husband   Hypophosphatemia -Patient's Phos Level was 2.1 -Continue to Monitor Closely and if still low in AM supplement -Repeat Phos Level in AM   Normocytic Anemia -? Dilutional Drop -Hb/Hct went from 12.0/37.3 -> 10.8/33.4 -Continue to Monitor for S/Sx of Bleeding  -Repeat CBC in AM   DVT prophylaxis: Heparin 5,000 units sq q12h Code Status: DO NOT RESUSCITATE  Family Communication: Discussed with her husband at bedside  Disposition Plan: LTAC when [employer] Authorization is received   Consultants:  PCCM Transfer Neurosurgery Neurology SLP Palliative Care Medicine    Procedures:   Craniectomy 3/15   EEG 3/19  TTE 3/21, EF 65-70%, moderate LVH   Antimicrobials:  Anti-infectives (From admission, onward)   Start     Dose/Rate Route Frequency Ordered Stop   04/23/17 1300  ceFAZolin (ANCEF) IVPB 1 g/50 mL premix     1 g 100 mL/hr over 30 Minutes Intravenous Every 8 hours 04/23/17 1258 04/23/17 2110   04/23/17 0856  bacitracin 50,000 Units in sodium chloride irrigation 0.9 % 500 mL irrigation  Status:  Discontinued  As needed 04/23/17 0857 04/23/17 1040   04/23/17 0745  ceFAZolin (ANCEF) IVPB 2g/100 mL premix  Status:  Discontinued     2 g 200 mL/hr over 30 Minutes Intravenous To Surgery 04/21/17 1844 04/23/17 1241     Subjective: Seen and examined at bedside and patient does not interact but does open her eyes to tactile stimuli. Spoke to the Husband and ok with going to [facility] and wants to continue to feed  his wife. CM paged and advised she was offered a bed but pending Ship broker. No other complaints or concerns at this time.   Objective: Vitals:   05/18/17 1500 05/18/17 2050 05/19/17 0441 05/19/17 1419  BP: 133/81 127/78 108/63 (!) 141/74  Pulse: 67 (!) 113 (!) 106 82  Resp: _0 Temp: 98.5 F (36.9 C) 98.6 F (37 C) 98.9 F (37.2 C) 99.2 F (37.3 C)  TempSrc: Oral Oral Oral Axillary  SpO2: 100% 99% 98% 98%  Weight:   49.9 kg (110 lb 0.2 oz)   Height:        Intake/Output Summary (Last 24 hours) at 05/19/2017 1743 Last data filed at 05/19/2017 0530 Gross per 24 hour  Intake 2159.91 ml  Output 1380 ml  Net 779.91 ml   Filed Weights   05/17/17 1257 05/18/17 0500 05/19/17 0441  Weight: 45.9 kg (101 lb 3.1 oz) 46.4 kg (102 lb 4.7 oz) 49.9 kg (110 lb 0.2 oz)    Examination: Physical Exam:  Constitutional: Thin Cachectic chronically ill appearing AAF in NAD and appears calm  Eyes: Lids and conjunctivae normal, sclerae anicteric but opens eyes spontaneously ENMT: Has Cortrak in place. Neck: Appears normal, supple, no cervical masses, normal ROM, no appreciable thyromegaly; no JVD Respiratory: Diminished to auscultation bilaterally, no wheezing, rales, rhonchi or crackles. Normal respiratory effort and patient is not tachypenic. No accessory muscle use.  Cardiovascular: RRR, no murmurs / rubs / gallops. S1 and S2 auscultated. No extremity edema.  Abdomen: Soft, non-tender, non-distended. No masses palpated. No appreciable hepatosplenomegaly. Bowel sounds positive x4.  GU: Deferred. Musculoskeletal: No clubbing / cyanosis of digits/nails. No joint deformity upper and lower extremities.  Skin: No rashes, lesions, ulcers on a limited skin eval. No induration; Warm and dry.  Neurologic: Opens eyes spontaneously to tactile stimuli but does not follow commands; Psychiatric: Impaired judgement and insight  Data Reviewed: I have personally reviewed following labs and  imaging studies  CBC: Recent Labs  Lab 05/18/17 0744 05/19/17 0925  WBC 6.0 5.4  NEUTROABS  --  2.8  HGB 12.0 10.8*  HCT 37.3 33.4*  MCV 95.6 97.4  PLT 150 161   Basic Metabolic Panel: Recent Labs  Lab 05/13/17 1732 05/16/17 0503 05/17/17 0712 05/18/17 0744 05/19/17 0925  NA  --   --   --  142 145  K  --   --   --  4.3 4.0  CL  --   --   --  106 111  CO2  --   --   --  31 28  GLUCOSE  --   --   --  182* 217*  BUN  --   --   --  16 18  CREATININE  --   --   --  0.51 0.58  CALCIUM 10.5* 10.9* 11.2* 11.4* 10.2  MG  --   --   --   --  1.9  PHOS  --   --   --   --  2.1*  GFR: Estimated Creatinine Clearance: 56 mL/min (by C-G formula based on SCr of 0.58 mg/dL). Liver Function Tests: Recent Labs  Lab 05/18/17 0744 05/19/17 0925  AST 71* 26  ALT 86* 61*  ALKPHOS 103 85  BILITOT 0.4 0.4  PROT 5.1* 4.8*  ALBUMIN 2.5* 2.3*   No results for input(s): LIPASE, AMYLASE in the last 168 hours. No results for input(s): AMMONIA in the last 168 hours. Coagulation Profile: No results for input(s): INR, PROTIME in the last 168 hours. Cardiac Enzymes: No results for input(s): CKTOTAL, CKMB, CKMBINDEX, TROPONINI in the last 168 hours. BNP (last 3 results) No results for input(s): PROBNP in the last 8760 hours. HbA1C: No results for input(s): HGBA1C in the last 72 hours. CBG: Recent Labs  Lab 05/19/17 0012 05/19/17 0438 05/19/17 0750 05/19/17 1208 05/19/17 1630  GLUCAP 248* 256* 192* 274* 281*   Lipid Profile: No results for input(s): CHOL, HDL, LDLCALC, TRIG, CHOLHDL, LDLDIRECT in the last 72 hours. Thyroid Function Tests: No results for input(s): TSH, T4TOTAL, FREET4, T3FREE, THYROIDAB in the last 72 hours. Anemia Panel: No results for input(s): VITAMINB12, FOLATE, FERRITIN, TIBC, IRON, RETICCTPCT in the last 72 hours. Sepsis Labs: No results for input(s): PROCALCITON, LATICACIDVEN in the last 168 hours.  No results found for this or any previous visit (from the  past 240 hour(s)).   Radiology Studies: No results found.  Scheduled Meds: . chlorhexidine gluconate (MEDLINE KIT)  15 mL Mouth Rinse BID  . furosemide  10 mg Intravenous Q12H  . heparin injection (subcutaneous)  5,000 Units Subcutaneous Q12H  . insulin aspart  0-9 Units Subcutaneous Q4H  . insulin glargine  10 Units Subcutaneous QHS  . multivitamin  15 mL Per Tube Daily  . pantoprazole sodium  40 mg Per Tube Daily  . PARoxetine  40 mg Per Tube Daily  . polyethylene glycol  17 g Per Tube Daily  . potassium chloride  20 mEq Per Tube Q12H  . risperiDONE  2 mg Per Tube QHS   Continuous Infusions: . feeding supplement (OSMOLITE 1.2 CAL) 1,000 mL (05/19/17 1250)  . levETIRAcetam Stopped (05/19/17 0530)     LOS: 30 days   Kerney Elbe, DO [facility] Pager [phone]  If 7PM-7AM, please contact night-coverage [email or web address] Password [facility] 05/19/2017, 5:43 PM

## 2017-05-19 NOTE — Progress Notes (Signed)
  Speech Language Pathology Treatment: Dysphagia  Patient Details Name: Rachel [name] MRN: [id number] DOB: 1950-03-30 Today's Date: 05/19/2017 Time: 6734-1937 SLP Time Calculation (min) (ACUTE ONLY): 13 min  Assessment / Plan / Recommendation Clinical Impression  SLP provided skilled observation and family training. SLP demonstrated to pt's spouse how to appropriately thicken liquids to a nectar-thick consistency and reviewed additional precautions regarding how to maximize safety with intake while acknowledging the ongoing risk for aspiration. Pt kept her eyes closed but had mildly improved oral acceptance of nectar thick liquids via tsp, needing Mod cues. Pt had consistent subtle throat clear after the swallow, concerning for reduced sensation of aspiration given MBS results. One delayed, wet cough was also noted. Further trials were deferred given seemingly increased risk at the moment. SLP reinforced the significance of her likely fluctuating ability, dependent on mentation/level of alertness, and the importance of trying to maximize the more opportune moments for intake.   HPI HPI: 67 year old female with history of Rosai-Dorfman's disease as well as diabetes mellitus and schizophrenia presents with worsening right-sided weakness and aphasia.  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  Patient admitted for treatment with IV steroids and possible later surgical resection.  No history of seizure.  Patient has a history of blindness.  She is status post a remote occipital craniectomy and resection of extradural lesion years ago. MRI shows LEFT dural thickening with 2 dural-based masses again noted. Anterior temporal extra-axial solidly enhancing mass was 16 x 24 mm, now 15 x 21 mm. The more posterior mass was 0.7 x 1.2 cm, now 2.5 x 3.3 cm corresponding to CT abnormality. Mass effect on  LEFT temporal lobes with severe T2 bright presume vasogenic edema LEFT temporal frontal and parietal lobes including posterior limb of the internal capsule. Regional mass effect with 3 mm new LEFT-to-RIGHT midline shift. LEFT uncal herniation. Partial effacement LEFT lateral ventricle without RIGHT ventricle entrapment or, hydrocephalus. Hemosiderin staining mesial LEFT parietal lobe at site of prior hemorrhage. Numerous scattered chronic micro hemorrhages within supra-and infratentorial brain. Moderate to severe parenchymal brain volume loss. No abnormal extra-axial fluid collections. Pt had BSE earlier this admission on 3/13 with primarily cognitively-based dysphagia with limited oral acceptance of POs. With cueing and precautions, it was recommended that she started with sips of water only. Pt was then intubated 3/15 for L craniotomy. Extubation was attempted after the procedure but she required reintubation. Pt then remained intubated until one-way extubation 4/3.      SLP Plan  Continue with current plan of care       Recommendations  Diet recommendations: Dysphagia 1 (puree);Nectar-thick liquid Liquids provided via: Teaspoon Medication Administration: Crushed with puree Supervision: Staff to assist with self feeding;Full supervision/cueing for compensatory strategies Compensations: Slow rate;Small sips/bites Postural Changes and/or Swallow Maneuvers: Seated upright 90 degrees                Oral Care Recommendations: Oral care QID Follow up Recommendations: LTACH SLP Visit Diagnosis: Dysphagia, oropharyngeal phase (R13.12) Plan: Continue with current plan of care       GO                Germain Osgood 05/19/2017, 3:24 PM  Germain Osgood, M.A. CCC-SLP [phone]

## 2017-05-19 NOTE — Care Management Note (Addendum)
Case Management Note  Patient Details  Name: Rachel [name] MRN: [id number] Date of Birth: [birth year]  Subjective/Objective:                    Action/Plan: [facility] has made a bed offer pending insurance authorization. Husband Rachel [name] prefers [facility] [employer] [facility] [employer] [facility] over [facility] . [facility] submitting for insurance authorization . Text paged MD.  Expected Discharge Date:                  Expected Discharge Plan:  Long Term Acute Care (LTAC)  In-House Referral:  Nutrition, Hospice / Palliative Care  Discharge planning Services  CM Consult  Post Acute Care Choice:  NA Choice offered to:  Spouse  DME Arranged:  N/A DME Agency:  NA  HH Arranged:  NA HH Agency:  NA  Status of Service:  In process, will continue to follow  If discussed at Long Length of Stay Meetings, dates discussed:    Additional Comments:  Marilu Favre, RN 05/19/2017, 11:11 AM

## 2017-05-19 NOTE — Progress Notes (Signed)
Inpatient Diabetes Program Recommendations  AACE/ADA: New Consensus Statement on Inpatient Glycemic Control (2015)  Target Ranges:  Prepandial:   less than 140 mg/dL      Peak postprandial:   less than 180 mg/dL (1-2 hours)      Critically ill patients:  140 - 180 mg/dL   Lab Results  Component Value Date   GLUCAP 274 (H) 05/19/2017   HGBA1C 7.4 (H) 04/20/2017    Review of Glycemic ControlResults for Rachel, [name] (MRN [id number]) as of 05/19/2017 12:17  Ref. Range 05/18/2017 20:11 05/19/2017 00:12 05/19/2017 04:38 05/19/2017 07:50 05/19/2017 12:08  Glucose-Capillary Latest Ref Range: 65 - 99 mg/dL 300 (H) 248 (H) 256 (H) 192 (H) 274 (H)   Diabetes history: Type 2 DM Outpatient Diabetes medications: NPH 18 units bid Current orders for Inpatient glycemic control:  Novolog sensitive q 4 hours, Lantus 10 units q HS  Inpatient Diabetes Program Recommendations:   Note CBG's greater than goal. May consider increasing Lantus to 15 units q HS.   Thanks, Adah Perl, RN, BC-ADM Inpatient Diabetes Coordinator Pager [phone] (8a-5p)

## 2017-05-20 ENCOUNTER — Inpatient Hospital Stay (HOSPITAL_COMMUNITY): Payer: BLUE CROSS/BLUE SHIELD

## 2017-05-20 LAB — URINALYSIS, ROUTINE W REFLEX MICROSCOPIC
BILIRUBIN URINE: NEGATIVE
Glucose, UA: NEGATIVE mg/dL
HGB URINE DIPSTICK: NEGATIVE
KETONES UR: NEGATIVE mg/dL
NITRITE: POSITIVE — AB
PH: 6 (ref 5.0–8.0)
Protein, ur: NEGATIVE mg/dL
SPECIFIC GRAVITY, URINE: 1.016 (ref 1.005–1.030)
Squamous Epithelial / LPF: NONE SEEN

## 2017-05-20 LAB — COMPREHENSIVE METABOLIC PANEL
ALT: 48 U/L (ref 14–54)
AST: 19 U/L (ref 15–41)
Albumin: 2.3 g/dL — ABNORMAL LOW (ref 3.5–5.0)
Alkaline Phosphatase: 79 U/L (ref 38–126)
Anion gap: 6 (ref 5–15)
BILIRUBIN TOTAL: 0.4 mg/dL (ref 0.3–1.2)
BUN: 18 mg/dL (ref 6–20)
CHLORIDE: 110 mmol/L (ref 101–111)
CO2: 27 mmol/L (ref 22–32)
CREATININE: 0.5 mg/dL (ref 0.44–1.00)
Calcium: 10.3 mg/dL (ref 8.9–10.3)
Glucose, Bld: 248 mg/dL — ABNORMAL HIGH (ref 65–99)
Potassium: 3.8 mmol/L (ref 3.5–5.1)
Sodium: 143 mmol/L (ref 135–145)
TOTAL PROTEIN: 4.8 g/dL — AB (ref 6.5–8.1)

## 2017-05-20 LAB — CBC WITH DIFFERENTIAL/PLATELET
BASOS ABS: 0 10*3/uL (ref 0.0–0.1)
Basophils Relative: 0 %
EOS PCT: 1 %
Eosinophils Absolute: 0.1 10*3/uL (ref 0.0–0.7)
HEMATOCRIT: 36.9 % (ref 36.0–46.0)
Hemoglobin: 11.7 g/dL — ABNORMAL LOW (ref 12.0–15.0)
LYMPHS PCT: 20 %
Lymphs Abs: 2.8 10*3/uL (ref 0.7–4.0)
MCH: 31.2 pg (ref 26.0–34.0)
MCHC: 31.7 g/dL (ref 30.0–36.0)
MCV: 98.4 fL (ref 78.0–100.0)
MONO ABS: 0.8 10*3/uL (ref 0.1–1.0)
Monocytes Relative: 6 %
Neutro Abs: 10.3 10*3/uL — ABNORMAL HIGH (ref 1.7–7.7)
Neutrophils Relative %: 73 %
PLATELETS: 173 10*3/uL (ref 150–400)
RBC: 3.75 MIL/uL — AB (ref 3.87–5.11)
RDW: 15.6 % — AB (ref 11.5–15.5)
WBC: 13.9 10*3/uL — ABNORMAL HIGH (ref 4.0–10.5)

## 2017-05-20 LAB — GLUCOSE, CAPILLARY
GLUCOSE-CAPILLARY: 284 mg/dL — AB (ref 65–99)
GLUCOSE-CAPILLARY: 295 mg/dL — AB (ref 65–99)
Glucose-Capillary: 237 mg/dL — ABNORMAL HIGH (ref 65–99)
Glucose-Capillary: 181 mg/dL — ABNORMAL HIGH (ref 65–99)
Glucose-Capillary: 264 mg/dL — ABNORMAL HIGH (ref 65–99)
Glucose-Capillary: 252 mg/dL — ABNORMAL HIGH (ref 65–99)

## 2017-05-20 LAB — MAGNESIUM: MAGNESIUM: 2 mg/dL (ref 1.7–2.4)

## 2017-05-20 LAB — PHOSPHORUS: PHOSPHORUS: 2 mg/dL — AB (ref 2.5–4.6)

## 2017-05-20 MED ORDER — POTASSIUM PHOSPHATES 15 MMOLE/5ML IV SOLN
20.0000 mmol | Freq: Once | INTRAVENOUS | Status: AC
  Administered 2017-05-20: 20 mmol via INTRAVENOUS
  Filled 2017-05-20: qty 6.67

## 2017-05-20 NOTE — Progress Notes (Signed)
PROGRESS NOTE    Rachel [name]  ZOX:[id number] DOB: 06/26/1950 DOA: 04/19/2017 PCP: Iona Beard, MD   Brief Narrative:  Rachel [name] is a 67 y.o. 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 Pt is s/p craniectomy with microdissection on 3/15.     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 Initially attempted stabilization with IV decadron then underwent elective left craniotomy for resection of tumors with microdissection on 3/15 by Neurosurgery. She failed post procedure extubation due to increased somnolence and inability to protect airway.  Keppra dosing was increased due to persistent poor responsiveness while intubated though EEG showed no seizure activity. Repeat CT imaging showed no acute changes with slight improvement in left cerebral vasogenic edema and similar left to right midline shift. Neurology recommended continuing AEDs and decadron with concern swelling was likely culprit of her encephalopathy.  MRI Brain on 3/20 consistent with scattered embolic strokes of right hemisphere, neurology believed these findings were incidental and not contributing to her poor mentation. Given her poor baseline prior to admission (dependent in ADLs and IADLs) believed this would be a very slow process for potential improvement and recommended palliative's involvement.    4/3 after husband spoke with palliative care team decided for one extubation with plan for DNR and no reintubation with plan to transition to comfort care if she was unable  to breathe on her own.  She was extubated to 2 L [place] with no complications.  Family asked for restarting tube feeding and medications to given her a chance to see if she could recover further prior to transfer to [facility] for further management.  Patient's husband working with [facility] and now attempting Feeding trials and is on Dysphagia 1 Diet with Nectar Thick Liquids. Still has Cortrak in place currently Palliative recommending discontinuing Cortrak and attempting feed patient but husband still unclear about PEG placement. She has been offered a bed at [facility] but was unfortunately denied Ship broker so will need to discuss with husband about disposition.  Overnight WBC elevated and Temperature was 99.4 so have pan-Cultured patient. Cortrak will be needed to be replaced in the next few days.   Assessment & Plan:   Active Problems:   HTN (hypertension)   PAF (paroxysmal atrial fibrillation) (HCC)   Rosai-Dorfman disease (HCC)   Diabetes mellitus type 2 in nonobese (HCC)   Acute encephalopathy   Hypokalemia   Brain edema (HCC)   Neoplasm of brain causing mass effect on adjacent structures (HCC)   Hypercalcemia   Pressure injury of skin   Protein-calorie malnutrition, severe   Failure to wean from mechanical ventilation (HCC)   Goals of care, counseling/discussion   Palliative care encounter   Palliative care by specialist   Dysphagia  Hypercalcemia related to [facility] and dehydration, improved slightly  -Peak of 11.8 during admission, getting worse again. Definitely not helping with mental status thought not suspected to be main driver -Trend since arrival to floor: 10.4 -> 10.5 -> 10.9 -> 11.2 -> 11.4 -> 10.2 -> 10.3 -Did not improve with IVF, has had pamidronate during hospitalization w/improvement (  3/12) and repeated yesterday  -IVF with NS at 125 mL/hr discontinued this Afternoon  -C/w IV Lasix 10 mg q12h and Potassium Supplementation   Enlarging left  sided extradural masses with vasogenic edema, stable -S/p tumors resection/craniectomy on 3/15.  -Unchanged mass effect and edema on MRI from 3/20. -Presumed tumors attributed to Rosai-Dorfman disease.  -Continue IV Levitiracetam 500 mg q12h for prophylaxis and change to po when able   Encephalopathy, stable and essentially unchanged  -Most likely multifactorial etiology: Related to vasogenic edema and midline shift due to above,  -Opens eyes spontaneously but not very interactive.  -Palliative care discussion with husband given poor prognosis to regain increased cognitive function have been slowly progressing, he thinks she is doing better -Continue Ativan 0.5 mg every 4 as needed. PRN IV Morphine 2 mg q2hprn for severe pain, IV Antiemetics with po/IV Zofran 4 mg q6h  Dysphagia, stable -Cognitively based, also exacerbated with intubation -Speech evaluation on 4/7 and MBS on 4/8: Dysphagia 1 diet with close supervision -Discussed with husband still some concern for aspiration; he understands risk of aspiration and confirmed DNR status and wants to pursue oral trials -On Tube Feeds via cortrak tube and if continues Cortrak to be replaced by 4/161/9 -Shared with husband low likelihood she could maintain nutrition on oral alone and temporary feeding tube, he thinks she just needs more time and does not say if he would agree to a PEG tube and still undecided  -Palliative involved in [place]  -C/w Oral Care with Chlorhexidine 15 mL Mouth Rinse BID  Paroxysmal Atrial Fibrillation  -Currently rate controlled -Per new speech recs, can try administering medications crushed with puree -Will continue to hold home diltiazem, as has remained at normal rate without meds  Acute Respiratory Failure related to Encephalopathy due to above,resolved. -Stable on room air -s/p one way extubation on 4/3 in ICU  -Previously required intubation shortly after craniectomy on 3/15  -C/w Albuterol 2.5 mg Neb q4hprn  Wheezing/SOB  Schizophrenia/Anxiety/Depression -Home Trazodone 50 mg per Tube, Risperidone 2 mg per Tube qHS and Paroxetine 40 mg per Tube via Cortrak feeding tube -C/w PRN IV Lorazepam 0.5 mg q4hprn Anxiety to prevent benzo withdrawal in setting of chronic benzodiazepine since day of admission  T2DM- A1c 7.4%(04/2017),  -Lantus 10 units increased to 15 units sq qHS -C/w Sensitive Novolog SSI q4h -CBG's ranging from 252-295 -HbA1c was 7.4 on 04/20/17  Severe Malnutrition in the Context of Chronic Illness -Nutritionist consulted and appreciate Recc's -C/w Osmolite 1.2 to 55 mL/hr via Cortrak and C/w 15 mL Liquid MVI Daily -If TF Continues will need to replace Cortrak by 05/25/17 or discuss PEG option with Husband   Hypophosphatemia -Patient's Phos Level was 2.0 -Replete with IV K Phos 20 mmol  -Continue to Monitor Closely and if still low in AM supplement -Repeat Phos Level in AM   Normocytic Anemia -? Dilutional Drop -Hb/Hct went from 12.0/37.3 -> 10.8/33.4 -> 11.7/36.9 -Continue to Monitor for S/Sx of Bleeding  -Repeat CBC in AM   SIRS/Leukocytosis -WBC went from 5.4 -> 13.9 -Had a Temp of 99.4 and HR was 103 -Concern about possible Aspiration  -Checked Blood Cx x2, Urinalysis, Urine Cx and CXR -Repeat CBC in AM and Follow up on Cx's  DVT prophylaxis: Heparin 5,000 units sq q12h Code Status: DO NOT RESUSCITATE  Family Communication: Discussed with her husband at bedside  Disposition Plan: Since Ship broker for LTAC was denied possibly Home with [facility] with replaced Cortrak vs. SNF with  PEG Tube.   Consultants:  PCCM Transfer Neurosurgery Neurology SLP Palliative Care Medicine    Procedures:   Craniectomy 3/15   EEG 3/19  TTE 3/21, EF 65-70%, moderate LVH   Antimicrobials:  Anti-infectives (From admission, onward)   Start     Dose/Rate Route Frequency Ordered Stop   04/23/17 1300  ceFAZolin (ANCEF) IVPB 1 g/50 mL premix     1 g 100 mL/hr  over 30 Minutes Intravenous Every 8 hours 04/23/17 1258 04/23/17 2110   04/23/17 0856  bacitracin 50,000 Units in sodium chloride irrigation 0.9 % 500 mL irrigation  Status:  Discontinued       As needed 04/23/17 0857 04/23/17 1040   04/23/17 0745  ceFAZolin (ANCEF) IVPB 2g/100 mL premix  Status:  Discontinued     2 g 200 mL/hr over 30 Minutes Intravenous To Surgery 04/21/17 1844 04/23/17 1241     Subjective: Seen and examined at bedside and did not interact as much again. Opened eyes to physical stimuli. Husband at bedside. All questions answered to his satisfaction.   Objective: Vitals:   05/19/17 1419 05/19/17 2038 05/20/17 0506 05/20/17 1410  BP: (!) 141/74 131/75 128/78 124/78  Pulse: 82 (!) 120 (!) 113 (!) 103  Resp: 18 18 17 18   Temp: 99.2 F (37.3 C) (!) 97.5 F (36.4 C) 99.4 F (37.4 C) (!) 97.3 F (36.3 C)  TempSrc: Axillary Oral Oral Oral  SpO2: 98% 98% 96% 100%  Weight:      Height:        Intake/Output Summary (Last 24 hours) at 05/20/2017 2003 Last data filed at 05/20/2017 1700 Gross per 24 hour  Intake 2329.17 ml  Output 1500 ml  Net 829.17 ml   Filed Weights   05/17/17 1257 05/18/17 0500 05/19/17 0441  Weight: 45.9 kg (101 lb 3.1 oz) 46.4 kg (102 lb 4.7 oz) 49.9 kg (110 lb 0.2 oz)   Examination: Physical Exam:  Constitutional: Thin Cachectic chronically ill appearing AAF in NAD laying in bed calm Eyes: Lids normal and opens eyes spontaneously  ENMT: Cortrak in place Neck: Supple with no JVD;  Respiratory: Diminished to auscultation; Unlabored breathing; Has a wet sounding cough  Cardiovascular: RRR; No appreciable m/r/g. No extremity edema  Abdomen: Soft, NT, ND. Bowel sounds present  GU: Deferred; Has foley in place with Amber color urine in foley bag Musculoskeletal: No clubbing or cyanosis. No joint deformities noted Skin: Warm and dry; No rashes or lesions on a limited skin eval Neurologic: Opens eyes spontaneously to tactile stimuli Psychiatric:  Impaired judgement and insight  Data Reviewed: I have personally reviewed following labs and imaging studies  CBC: Recent Labs  Lab 05/18/17 0744 05/19/17 0925 05/20/17 0421  WBC 6.0 5.4 13.9*  NEUTROABS  --  2.8 10.3*  HGB 12.0 10.8* 11.7*  HCT 37.3 33.4* 36.9  MCV 95.6 97.4 98.4  PLT 150 164 387   Basic Metabolic Panel: Recent Labs  Lab 05/16/17 0503 05/17/17 0712 05/18/17 0744 05/19/17 0925 05/20/17 0421  NA  --   --  142 145 143  K  --   --  4.3 4.0 3.8  CL  --   --  106 111 110  CO2  --   --  31 28 27   GLUCOSE  --   --  182* 217* 248*  BUN  --   --  16 18 18   CREATININE  --   --  0.51 0.58 0.50  CALCIUM 10.9* 11.2* 11.4* 10.2 10.3  MG  --   --   --  1.9 2.0  PHOS  --   --   --  2.1* 2.0*   GFR: Estimated Creatinine Clearance: 56 mL/min (by C-G formula based on SCr of 0.5 mg/dL). Liver Function Tests: Recent Labs  Lab 05/18/17 0744 05/19/17 0925 05/20/17 0421  AST 71* 26 19  ALT 86* 61* 48  ALKPHOS 103 85 79  BILITOT 0.4 0.4 0.4  PROT 5.1* 4.8* 4.8*  ALBUMIN 2.5* 2.3* 2.3*   No results for input(s): LIPASE, AMYLASE in the last 168 hours. No results for input(s): AMMONIA in the last 168 hours. Coagulation Profile: No results for input(s): INR, PROTIME in the last 168 hours. Cardiac Enzymes: No results for input(s): CKTOTAL, CKMB, CKMBINDEX, TROPONINI in the last 168 hours. BNP (last 3 results) No results for input(s): PROBNP in the last 8760 hours. HbA1C: No results for input(s): HGBA1C in the last 72 hours. CBG: Recent Labs  Lab 05/20/17 0409 05/20/17 0819 05/20/17 1156 05/20/17 1555 05/20/17 1938  GLUCAP 237* 181* 295* 264* 252*   Lipid Profile: No results for input(s): CHOL, HDL, LDLCALC, TRIG, CHOLHDL, LDLDIRECT in the last 72 hours. Thyroid Function Tests: No results for input(s): TSH, T4TOTAL, FREET4, T3FREE, THYROIDAB in the last 72 hours. Anemia Panel: No results for input(s): VITAMINB12, FOLATE, FERRITIN, TIBC, IRON, RETICCTPCT  in the last 72 hours. Sepsis Labs: No results for input(s): PROCALCITON, LATICACIDVEN in the last 168 hours.  No results found for this or any previous visit (from the past 240 hour(s)).   Radiology Studies: Dg Chest Port 1 View  Result Date: 05/20/2017 CLINICAL DATA:  Shortness of breath. EXAM: PORTABLE CHEST 1 VIEW COMPARISON:  05/11/2017 FINDINGS: Cardiac silhouette is normal in size.  No mediastinal or masses. Lung volumes are low. Prominent bronchovascular markings bases accentuated by the low lung volumes. No lung consolidation to suggest pneumonia. No pulmonary edema. No pleural effusion or pneumothorax. Enteric feeding tube passes below the diaphragm and below the included field of view. IMPRESSION: 1. No acute cardiopulmonary disease. 2. Status post removal of the endotracheal tube since the prior exam. Electronically Signed   By: Lajean Manes M.D.   On: 05/20/2017 10:05    Scheduled Meds: . chlorhexidine gluconate (MEDLINE KIT)  15 mL Mouth Rinse BID  . furosemide  10 mg Intravenous Q12H  . heparin injection (subcutaneous)  5,000 Units Subcutaneous Q12H  . insulin aspart  0-9 Units Subcutaneous Q4H  . insulin glargine  15 Units Subcutaneous QHS  . multivitamin  15 mL Per Tube Daily  . pantoprazole sodium  40 mg Per Tube Daily  . PARoxetine  40 mg Per Tube Daily  . polyethylene glycol  17 g Per Tube Daily  . potassium chloride  20 mEq Per Tube Q12H  . risperiDONE  2 mg Per Tube QHS   Continuous Infusions: . feeding supplement (OSMOLITE 1.2 CAL) 1,000 mL (05/19/17 1250)  . levETIRAcetam Stopped (05/19/17 1821)     LOS: 31 days   Kerney Elbe, DO [facility] Pager [phone]  If 7PM-7AM, please contact night-coverage [email or web address] Password [facility] 05/20/2017, 8:03 PM

## 2017-05-20 NOTE — Care Management Note (Addendum)
Case Management Note  Patient Details  Name: Rachel [name] MRN: [id number] Date of Birth: 1950-11-26  Subjective/Objective:                    Action/Plan: Spoke to [place]. Explained Insurance denied LTAC . Johnny voiced understanding . Discussed other options. If insurance approved SNF for short term rehab patient would not be able to go with cortrak she would need a PEG. If patient went home with home health she could have cortrak and tube feedings . Discussed home health would not be visiting daily or for long periods of time.  [name] states he will think over options tonight and discuss with MD in am.   WIll follow up in am.  Received a call from [place] with [facility] . [employer] has denied LTAC. MD can do peer to peer by calling [phone] ext [phone] . Information given to MD. MD called spoke with another MD and patient does not meet LTAC criteria per [place].   Discussed dispostion with MD. If [name] wants his wife to go to SNF for short term rehab she will need a PEG. If he wants to take her home she could do home with a new cortrak and tube feedings and home health.   Patient's son currently in room. Johnny went downstairs to eat and will return to patient's room at 1530. NCM will return to patient's room at 1530 to discuss discharge planning with [name] and son.   Expected Discharge Date:                  Expected Discharge Plan:     In-House Referral:  Nutrition, Hospice / Palliative Care  Discharge planning Services  CM Consult  Post Acute Care Choice:  NA Choice offered to:  Spouse, Adult Children  DME Arranged:    DME Agency:     HH Arranged:    HH Agency:     Status of Service:  In process, will continue to follow  If discussed at Long Length of Stay Meetings, dates discussed:    Additional Comments:  Marilu Favre, RN 05/20/2017, 2:54 PM

## 2017-05-21 LAB — CBC WITH DIFFERENTIAL/PLATELET
BASOS ABS: 0 10*3/uL (ref 0.0–0.1)
Basophils Relative: 0 %
EOS ABS: 0.2 10*3/uL (ref 0.0–0.7)
EOS PCT: 2 %
HCT: 36.5 % (ref 36.0–46.0)
Hemoglobin: 12.2 g/dL (ref 12.0–15.0)
LYMPHS PCT: 37 %
Lymphs Abs: 2.9 10*3/uL (ref 0.7–4.0)
MCH: 32.4 pg (ref 26.0–34.0)
MCHC: 33.4 g/dL (ref 30.0–36.0)
MCV: 96.8 fL (ref 78.0–100.0)
MONO ABS: 0.6 10*3/uL (ref 0.1–1.0)
Monocytes Relative: 8 %
Neutro Abs: 4.2 10*3/uL (ref 1.7–7.7)
Neutrophils Relative %: 53 %
PLATELETS: 189 10*3/uL (ref 150–400)
RBC: 3.77 MIL/uL — AB (ref 3.87–5.11)
RDW: 15.4 % (ref 11.5–15.5)
WBC: 7.9 10*3/uL (ref 4.0–10.5)

## 2017-05-21 LAB — COMPREHENSIVE METABOLIC PANEL
ALT: 40 U/L (ref 14–54)
AST: 18 U/L (ref 15–41)
Albumin: 2.2 g/dL — ABNORMAL LOW (ref 3.5–5.0)
Alkaline Phosphatase: 65 U/L (ref 38–126)
Anion gap: 7 (ref 5–15)
BUN: 18 mg/dL (ref 6–20)
CHLORIDE: 108 mmol/L (ref 101–111)
CO2: 26 mmol/L (ref 22–32)
Calcium: 10 mg/dL (ref 8.9–10.3)
Creatinine, Ser: 0.41 mg/dL — ABNORMAL LOW (ref 0.44–1.00)
GFR calc non Af Amer: >60 mL/min (ref >60)
Glucose, Bld: 214 mg/dL — ABNORMAL HIGH (ref 65–99)
POTASSIUM: 4.2 mmol/L (ref 3.5–5.1)
SODIUM: 141 mmol/L (ref 135–145)
Total Bilirubin: 0.4 mg/dL (ref 0.3–1.2)
Total Protein: 4.8 g/dL — ABNORMAL LOW (ref 6.5–8.1)

## 2017-05-21 LAB — GLUCOSE, CAPILLARY
GLUCOSE-CAPILLARY: 134 mg/dL — AB (ref 65–99)
GLUCOSE-CAPILLARY: 227 mg/dL — AB (ref 65–99)
GLUCOSE-CAPILLARY: 178 mg/dL — AB (ref 65–99)
Glucose-Capillary: 242 mg/dL — ABNORMAL HIGH (ref 65–99)

## 2017-05-21 LAB — MAGNESIUM: MAGNESIUM: 1.9 mg/dL (ref 1.7–2.4)

## 2017-05-21 LAB — PHOSPHORUS: PHOSPHORUS: 2.5 mg/dL (ref 2.5–4.6)

## 2017-05-21 MED ORDER — ADULT MULTIVITAMIN W/MINERALS CH: 1.0000 | ORAL_TABLET | Freq: Every day | ORAL | Status: DC

## 2017-05-21 MED ORDER — ONDANSETRON HCL 4 MG PO TABS: 4.0000 mg | ORAL_TABLET | Freq: Four times a day (QID) | ORAL | 0 refills | Status: DC | PRN

## 2017-05-21 MED ORDER — LEVETIRACETAM 500 MG PO TABS: 500.0000 mg | ORAL_TABLET | Freq: Two times a day (BID) | ORAL | 0 refills | Status: DC

## 2017-05-21 MED ORDER — ALBUTEROL SULFATE (2.5 MG/3ML) 0.083% IN NEBU: 2.5000 mg | INHALATION_SOLUTION | RESPIRATORY_TRACT | 12 refills | Status: DC | PRN

## 2017-05-21 MED ORDER — CERTA-VITE PO LIQD: 5.0000 mL | Freq: Every day | ORAL | 0 refills | Status: DC

## 2017-05-21 MED ORDER — PANTOPRAZOLE SODIUM 40 MG PO TBEC: 40.0000 mg | DELAYED_RELEASE_TABLET | Freq: Every day | ORAL | 1 refills | Status: DC

## 2017-05-21 MED ORDER — POLYETHYLENE GLYCOL 3350 17 G PO PACK: 17.0000 g | PACK | Freq: Every day | ORAL | 0 refills | Status: DC

## 2017-05-21 NOTE — Progress Notes (Signed)
Pt discharged home with husband after going over discharge teaching with husband. Husband provided transportation home after pt was helped into wheelchair. AVS given before leaving unit

## 2017-05-21 NOTE — Progress Notes (Signed)
Nutrition Follow-up  DOCUMENTATION CODES:   Underweight, Severe malnutrition in context of chronic illness  INTERVENTION:   -D/c Osmolite 1.2 @ 55 ml/hr (due to no enteral access) -Magic Cup TID with meals, each supplement provides 290 kcals and 9 grams protein -MVI daily  NUTRITION DIAGNOSIS:   Severe Malnutrition related to chronic illness(brain tumor) as evidenced by severe fat depletion, severe muscle depletion, percent weight loss.  Ongoing  GOAL:   Patient will meet greater than or equal to 90% of their needs  Progressing   MONITOR:   Weight trends, TF tolerance, Labs, I & O's  REASON FOR ASSESSMENT:   Ventilator, Consult Enteral/tube feeding initiation and management  ASSESSMENT:   67 y/o F who presented 3/11 with a 2 day history of worsening right sided weakness. 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 Post procedure, she failed extubation and required re-intubation. PMH of blindness, DM type 1, GERD, hyperlipidemia, schizophrenia, SAH, HTN, and Rosai Dorfman. At baseline pt dependent for all ADL's.  3/154/3intubated 3/16 Cortrak  Reviewed I/O's: +667.5 ml x 24 hours and +10.2 L since 05/07/17  Case discussed with RN. Pt husband has decided to no pursue PEG. Plan to take cortrak out and d/c home today.   Pt has been tolerating diet well. Noted increased intake (PO: 50-100%).   Labs reviewed: CBGS: 032-122 (inpatient orders for glycemic control are 0-9 units insulin aspart every 4 hours and 15 units insulin aspart q HS).   Diet Order:  Fall precautions Seizure precautions Aspiration precautions DIET - DYS 1 Room service appropriate? Yes; Fluid consistency: Nectar Thick Diet - low sodium heart healthy  EDUCATION NEEDS:   Not appropriate for education at this time  Skin:  Skin Assessment: Skin Integrity Issues: Skin Integrity Issues:: Stage III Stage II: progressed to stage  III Stage III: sacrum Incisions: head  Last BM:  05/19/17  Height:   Ht Readings from Last 1 Encounters:  05/04/17 5\' 6"  (1.676 m)    Weight:   Wt Readings from Last 1 Encounters:  05/21/17 108 lb 0.4 oz (49 kg)    Ideal Body Weight:  59.1 kg  BMI:  Body mass index is 17.44 kg/m.  Estimated Nutritional Needs:   Kcal:  1450-1650  Protein:  70-85 grams  Fluid:  > 1.4 L    Rachel [name] A. Jimmye Norman, RD, LDN, CDE Pager: [phone] After hours Pager: [phone]

## 2017-05-21 NOTE — Care Management Note (Addendum)
Case Management Note  Patient Details  Name: Rachel [name] MRN: [id number] Date of Birth: 09-28-1950  Subjective/Objective:                    Action/Plan:Gold DNR form placed on chart and paged MD for signature   Discussed discharge planning with Johnny at bedside. Johnny wants to take his wife home with home health and no cortrak or PEG . Confirmed face sheet information.   Patient has walker, bedside commode and wheelchair at home. Offered to order hospital bed , [name] does not want a hospital bed at this time. Explained if he changes his mind before he leaves today NCM will order or after discharge [facility] can order.   Offered non emergent ambulance transport home . Johnny declined. Johnny stated "I will carry her home in my car". Again if [name] decides he wants ambulance transport home NCM will arrange. PTAR paperwork completed and in shadow chart in case needed.  Johnny and patient's bedside nurse voiced understanding to all of above. Expected Discharge Date:                  Expected Discharge Plan:  [place]  In-House Referral:  Nutrition, Hospice / Palliative Care  Discharge planning Services  CM Consult  Post Acute Care Choice:  Home Health Choice offered to:  Spouse  DME Arranged:  N/A DME Agency:  NA  HH Arranged:  RN, PT, OT, Disease Management, Nurse's Aide, Social Work [employer] Agency:  [place]  Status of Service:  Completed, signed off  If discussed at [employer], dates discussed:    Additional Comments:  Marilu Favre, RN 05/21/2017, 11:07 AM

## 2017-05-21 NOTE — Discharge Summary (Signed)
Physician Discharge Summary  Rachel [name]:[id number] DOB: 08-06-1950 DOA: 04/19/2017  PCP: Iona Beard, MD  Admit date: 04/19/2017 Discharge date: 05/21/2017  Admitted From: Home Disposition: Home with Home Health  Recommendations for Outpatient Follow-up:  1. Follow up with PCP in 1-2 weeks 2. Palliative care to follow at D/C 3. Please obtain CMP/CBC, Mag, Phos in one week 4. 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 Pt is s/p craniectomy with microdissection on 3/15.   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 Initially attempted stabilization with IV decadron then underwent elective left craniotomy for resection of tumors with microdissection on 3/15 by Neurosurgery. She failed post procedure extubation due to increased somnolence and inability to protect airway. Keppra dosing was increased due to persistent poor responsiveness while intubated though EEG showed no seizure activity. Repeat CT imaging showed no acute changes with slight improvement in left cerebral vasogenic edema and similar left to right midline shift. Neurology recommended continuing AEDs and decadron with concern swelling was likely  culprit of her encephalopathy. MRI Brain on 3/20 consistent with scattered embolic strokes of right hemisphere, neurology believed these findings were incidental and not contributing to her poor mentation. Given her poor baseline prior to admission (dependent in ADLs and IADLs) believed this would be a very slow process for potential improvement and recommended palliative's involvement.   4/3 after husband spoke with palliative care team decided for one extubation with plan for DNR and no reintubation with plan to transition to comfort care if she was unable to breathe on her own. She was extubated to 2 L [place] with no complications. Family asked for restarting tube feeding and medications to given her a chance to see if she could recover further prior to transfer to [facility] for further management.  Patient's husband working with [place] and now attempting Feeding trials and is on Dysphagia 1 Diet with Nectar Thick Liquids. Had Cortrak in place currently Palliative recommending discontinuing Cortrak and attempting feed patient but husband still unclear about PEG placement. She has been offered a bed at [facility] but was unfortunately denied [facility] so will need to discuss with husband about disposition.  Overnight WBC elevated and Temperature was 99.4 so have pan-Cultured patient. Cortrak will be needed to be replaced in the next few days.   Unfortunately patient was denied LTAC and does not wish for any more artificial feeding insurance authorization.  After lengthy discussion with the husband he elected to take the patient home and has asked [place] to remove the core track as he believes that she is eating well.  I discussed the case with the patient's been who understands the risks that she may not meet all her nutritional goals at this time and has a risk for aspirating.  He understands these risks and wants to take his wife home.  Will be discharged home with home health at this  time and she will need to follow-up with PCP as well palliative at discharge.  Discharge Diagnoses:  Active Problems:   HTN (hypertension)   PAF (paroxysmal atrial fibrillation) (HCC)   Rosai-Dorfman disease (HCC)   Diabetes mellitus type 2 in nonobese (HCC)   Acute encephalopathy   Hypokalemia   Brain edema (HCC)   Neoplasm of brain causing mass effect on adjacent structures (HCC)   Hypercalcemia   Pressure injury of skin   Protein-calorie malnutrition, severe   Failure to wean from mechanical ventilation (HCC)   Goals of care, counseling/discussion   Palliative care encounter   Palliative care by specialist   Dysphagia  [facility] and dehydration, improved slightly  -Peak of 11.8 during admission, getting worse again. Definitely not helping with mental status thought not suspected to be main driver -Trend since arrival to floor: 10.4 -> 10.5 -> 10.9 -> 11.2 -> 11.4 -> 10.2 -> 10.3 -> 10.0 -Did not improve with IVF, has had pamidronate during hospitalization w/improvement ( 3/12) and repeated yesterday  -IVF with NS at 125 mL/hr discontinued this Afternoon  -C/w IV Lasix 10 mg q12h and Potassium Supplementation now stopped.   Enlarging left sided extradural masses with vasogenic edema, stable -S/p tumors resection/craniectomy on 3/15.  -Unchanged mass effect and edema on MRI from 3/20. -Presumed tumors attributed to Rosai-Dorfman disease.  -Continue IV Levitiracetam 500 mg q12h for prophylaxis and changed to po for discharge.  Encephalopathy, stable and essentially unchanged  -Most likely multifactorial etiology: Related to vasogenic edema and midline shift due to above,  -Opens eyes spontaneously but not very interactive.  -Palliative care discussion with husband given poor prognosis to regain increased cognitive functionhave been slowly progressing, he thinks she is doing better -Given Ativan 0.5 mg every 4 as needed. PRN IV Morphine 2 mg q2hprn  for severe pain, IV Antiemetics with po/IV Zofran 4 mg q6h -Continue home meds as able  Dysphagia, stable -Cognitively based, also exacerbated with intubation -Speech evaluation on 4/7and MBS on 4/8:Dysphagia 1 diet with close supervision -Discussed with husband still some concern for aspiration; he understands risk of aspiration and confirmed DNR status and wants to pursue oral trials -On Tube Feeds via cortrak tube and if continues Cortrak to be replaced by 4/161/9 however her husband does not wish for replacement and wants to take his wife home without it. -Shared with husband low likelihood she could maintain nutrition on oral alone and temporary feeding tube, he thinks she just needs more time and refuses PEG at this time as well as refusing to take a Cortrak home -Palliative involved in [place]  -C/w Oral Care with Chlorhexidine 15 mL Mouth Rinse BID  Paroxysmal Atrial Fibrillation  -Currently rate controlled -Per new speech recs, can try administering medications crushed with puree -Will continue to hold home diltiazem at discharge discharge, as has remained at normal rate without meds  Acute Respiratory Failure related to Encephalopathy due to above,resolved. -Stable on room air -s/p one way extubation on 4/3 in ICU  -Previously required intubation shortly after craniectomy on 3/15  -C/w Albuterol 2.5 mg Neb q4hprn Wheezing/SOB at discharge  Schizophrenia/Anxiety/Depression -Home Trazodone 50 mg per Tube, Risperidone 2 mg per Tube qHS and Paroxetine 40 mg per Tube via Cortrak feeding tube via CorTrak there and has been ruled room p.o. at home -C/w PRN IV Lorazepam 0.5 mg q4hprn Anxiety to prevent benzo withdrawal in setting of chronic benzodiazepine since  day of admission  T2DM- A1c 7.4%(04/2017),  -Lantus 10 units increased to 15 units sq qHS; to new home regimen -C/w Sensitive Novolog SSI q4h -CBG's ranging from  134-242 -HbA1c was 7.4 on 04/20/17  Severe Malnutrition in  the Context of Chronic Illness -Nutritionist consulted and appreciate Recc's -C/w Osmolite 1.2 to 55 mL/hr via Cortrak and C/w 15 mL Liquid MVI Daily Husband elects not to have PEG tube placed and Cortrak will not be placed either patient elects to take patient home wound risks that she may not need a full nutritional needs with p.o. nutrition and has a high risk of aspirating.  Hypophosphatemia -Patient's Phos Level was 2.5 -Repeat Phos Level as an outpatient   Normocytic Anemia -? Dilutional Drop -Hb/Hct went from 12.0/37.3 -> 10.8/33.4 -> 11.7/36.9 -> 12./36.5 -Continue to Monitor for S/Sx of Bleeding  -Repeat CBC in AM   SIRS/Leukocytosis, improved -WBC went from 5.4 -> 13.9 -> 7.9 -Had a Temp of 99.4 and HR was 103 -Concern about possible Aspiration  -Checked Blood Cx x2 and showed No Growth at 1 Day, Urinalysis Appearance was Hazy, Moderate Leukocytes, Positive Nitrites, Rare Bacteria, and 6-30 WBC,  -Urine Cx Pending -CXR showed No acute cardiopulmonary disease. Status post removal of the endotracheal tube since the prior Exam. -Repeat CBC as an Outpatient and and Follow up on Cx's  Discharge Instructions Discharge Instructions    Call MD for:  difficulty breathing, headache or visual disturbances   Complete by:  As directed    Call MD for:  extreme fatigue   Complete by:  As directed    Call MD for:  hives   Complete by:  As directed    Call MD for:  persistant dizziness or light-headedness   Complete by:  As directed    Call MD for:  persistant nausea and vomiting   Complete by:  As directed    Call MD for:  redness, tenderness, or signs of infection (pain, swelling, redness, odor or green/yellow discharge around incision site)   Complete by:  As directed    Call MD for:  severe uncontrolled pain   Complete by:  As directed    Call MD for:  temperature >100.4   Complete by:  As directed    DME Nebulizer/meds   Complete by:  As directed    Patient needs a  nebulizer to treat with the following condition:  Respiratory failure ([place])   Diet - low sodium heart healthy   Complete by:  As directed    DYSPHAGIA 1 (PUREE) [place]   Discharge instructions   Complete by:  As directed    Follow up with PCP and Have Palliative Care follow at Discharge. Take all medications as prescribed. If symptoms change or worsen please return to the ED for evaluation.   Increase activity slowly   Complete by:  As directed      Allergies as of 05/21/2017      Reactions   Sulfa Antibiotics Anaphylaxis   Ibuprofen Other (See Comments)   unknown   Shellfish Allergy Swelling   Mouth      Medication List    TAKE these medications   acetaminophen 500 MG tablet Commonly known as:  TYLENOL Take 1,000 mg by mouth every 6 (six) hours as needed for mild pain.   albuterol (2.5 MG/3ML) 0.083% nebulizer solution Commonly known as:  PROVENTIL Take 3 mLs (2.5 mg total) by nebulization every 4 (four) hours as needed for wheezing  or shortness of breath.   ALPRAZolam 0.25 MG tablet Commonly known as:  XANAX TAKE 1 TABLET BY MOUTH THREE TIMES DAILY AS NEEDED FOR ANXIETY   diltiazem 180 MG 24 hr capsule Commonly known as:  CARDIZEM CD Take 1 capsule (180 mg total) by mouth daily.   famotidine 20 MG tablet Commonly known as:  PEPCID Take 20 mg by mouth daily.   insulin NPH Human 100 UNIT/ML injection Commonly known as:  HUMULIN N,NOVOLIN N Inject 0.18 mLs (18 Units total) into the skin 2 (two) times daily at 8 am and 10 pm.   levETIRAcetam 500 MG tablet Commonly known as:  KEPPRA Take 1 tablet (500 mg total) by mouth 2 (two) times daily.   loratadine 10 MG tablet Commonly known as:  CLARITIN Take 10 mg by mouth daily as needed for allergies.   multivitamin with minerals Liqd Take 5 mLs by mouth daily.   ondansetron 4 MG tablet Commonly known as:  ZOFRAN Take 1 tablet (4 mg total) by mouth every 6 (six) hours as needed for nausea.    pantoprazole 40 MG tablet Commonly known as:  PROTONIX Take 1 tablet (40 mg total) by mouth daily.   PARoxetine 40 MG tablet Commonly known as:  PAXIL Take 1 tablet (40 mg total) at bedtime by mouth.   polyethylene glycol packet Commonly known as:  MIRALAX / GLYCOLAX Place 17 g into feeding tube daily. Start taking on:  05/22/2017   risperiDONE 2 MG tablet Commonly known as:  RISPERDAL Take 1 tablet (2 mg total) by mouth at bedtime.   rosuvastatin 10 MG tablet Commonly known as:  CRESTOR Take 1 tablet (10 mg total) by mouth every evening.   temazepam 15 MG capsule Commonly known as:  RESTORIL Take 1 capsule (15 mg total) at bedtime as needed by mouth for sleep.   traZODone 50 MG tablet Commonly known as:  DESYREL Take 1 tablet (50 mg total) by mouth at bedtime.            Durable Medical Equipment  (From admission, onward)        Start     Ordered   05/21/17 0000  DME Nebulizer/meds    Question:  Patient needs a nebulizer to treat with the following condition  Answer:  Respiratory failure ([place])   05/21/17 1338      Allergies  Allergen Reactions  . Sulfa Antibiotics Anaphylaxis  . Ibuprofen Other (See Comments)    unknown  . Shellfish Allergy Swelling    Mouth   Consultations: PCCM Transfer Neurosurgery Neurology SLP Palliative Care Medicine   Procedures/Studies: Ct Head Wo Contrast  Result Date: 04/26/2017 CLINICAL DATA:  Follow-up examination status post brain tumor excision. EXAM: CT HEAD WITHOUT CONTRAST TECHNIQUE: Contiguous axial images were obtained from the base of the skull through the vertex without intravenous contrast. COMPARISON:  Prior CT from 04/24/2017. FINDINGS: Brain: Postoperative changes from recent left frontotemporal craniotomy for resection of left lower convexity dural-based mass. Persistent small volume postoperative pneumocephalus, improved relative to previous exam. Mixed intra-axial and extra-axial hemorrhage at the resection  cavity is little interval changed. Persistent vasogenic edema within the left cerebral hemisphere appears slightly improved. Mild mass effect on the left lateral ventricle. 4 mm of left-to-right shift not significantly changed. No new hydrocephalus or ventricular trapping. Basilar cisterns remain patent. Otherwise stable appearance of the brain. No new intracranial hemorrhage. No acute large vessel territory infarct. Stable atrophy with chronic small vessel ischemic disease. Sequelae of prior  suboccipital craniectomy. Vascular: No hyperdense vessel. Skull: Postoperative changes from previous left frontotemporal craniotomy. Skin staples remain in place. Sinuses/Orbits: Globes and orbital soft tissues within normal limits. Paranasal sinuses remain largely clear. Small left mastoid effusion. Other: None. IMPRESSION: 1. Postoperative changes from recent left frontotemporal craniotomy for tumor resection of left lower dural-based tumor. Decreasing postoperative pneumocephalus with similar blood products at the resection site. 2. Slightly improved left cerebral vasogenic edema with similar 3 mm of left-to-right shift. 3. No other new acute intracranial abnormality. Electronically Signed   By: Jeannine Boga M.D.   On: 04/26/2017 02:29   Ct Head Wo Contrast  Result Date: 04/24/2017 CLINICAL DATA:  Status post tumor resection. EXAM: CT HEAD WITHOUT CONTRAST TECHNIQUE: Contiguous axial images were obtained from the base of the skull through the vertex without intravenous contrast. COMPARISON:  Brain MRI 04/19/2017 and head CT 04/19/2016 FINDINGS: Brain: Status post resection of lower left convexity mass. There is moderate volume pneumocephalus at the frontal poles and along the left convexity. There is mixed intra-axial and extra-axial blood at the resection site (predominantly extra-axial). Slightly decreased edema in the left hemisphere. Persistent mass effect on the left lateral ventricle. At the level of the  foramina of [place], there is approximately 4 mm of rightward midline shift, unchanged. Vascular: No hyperdense vessel or unexpected vascular calcification. Skull: Status post left pterional craniotomy with overlying soft tissue swelling and skin staples. Remote suboccipital craniectomy. Sinuses/Orbits: No sinus fluid levels or advanced mucosal thickening. No mastoid effusion. Normal orbits. IMPRESSION: 1. Status post resection of lower left convexity tumor with postoperative pneumocephalus and mixed intra-axial/extra-axial blood at the resection site. 2. Unchanged 3-4 mm rightward midline shift. 3. Moderate volume pneumocephalus with flattening of the frontal poles. Attention on follow-up to exclude tension pneumocephalus. Electronically Signed   By: Ulyses Jarred M.D.   On: 04/24/2017 00:59   Mr Jeri Cos XT Contrast  Result Date: 04/28/2017 CLINICAL DATA:  Altered mental status EXAM: MRI HEAD WITHOUT AND WITH CONTRAST TECHNIQUE: Multiplanar, multiecho pulse sequences of the brain and surrounding structures were obtained without and with intravenous contrast. CONTRAST:  2m MULTIHANCE GADOBENATE DIMEGLUMINE 529 MG/ML IV SOLN COMPARISON:  Head CT 04/26/2017 Brain MRI 04/19/2017 FINDINGS: Brain: Partially empty sella. Remote suboccipital craniectomy. Signal abnormality on diffusion-weighted imaging along the peripheral left temporal lobe at the site of recent tumor resection. There are small foci of abnormal diffusion restriction within both parietal lobes. There is mass effect on the left lateral ventricle. Vasogenic edema within the left parietal and temporal white matter extending along the left external capsule. No age-advanced or lobar predominant atrophy. Foci of chronic microhemorrhage in the left cerebellum. Extensive susceptibility of the left temporal lobe at the resection site. There is persistent subacute blood extra axially at the resection site. The post-contrast axial T1-weighted images are degraded  by motion, but there is no discrete enhancement at the resection site. The areas of T1 shortening in this area all are intrinsic. Rightward midline shift is unchanged, measuring 4 mm at the foramina of [place]. Vascular: Major intracranial arterial and venous sinus flow voids are preserved. Skull and upper cervical spine: Left pterional craniotomy. Sinuses/Orbits: No fluid levels or advanced mucosal thickening. No mastoid or middle ear effusion. Normal orbits. IMPRESSION: 1. Multiple punctate foci of acute ischemia within both parietal lobes, likely embolic infarcts. 2. Persistent subacute blood products overlying the left temporal lobe resection site with unchanged vasogenic edema in the left temporal and parietal white matter and along the  left external capsule. The diffusion abnormality along the periphery of the left temporal lobe is likely artifactual secondary to susceptibility effects from the blood products there. 3. No residual contrast enhancing tumor. 4. Unchanged mass effect with 4 mm rightward midline shift. Electronically Signed   By: Ulyses Jarred M.D.   On: 04/28/2017 17:48   Dg Chest Port 1 View  Result Date: 05/20/2017 CLINICAL DATA:  Shortness of breath. EXAM: PORTABLE CHEST 1 VIEW COMPARISON:  05/11/2017 FINDINGS: Cardiac silhouette is normal in size.  No mediastinal or masses. Lung volumes are low. Prominent bronchovascular markings bases accentuated by the low lung volumes. No lung consolidation to suggest pneumonia. No pulmonary edema. No pleural effusion or pneumothorax. Enteric feeding tube passes below the diaphragm and below the included field of view. IMPRESSION: 1. No acute cardiopulmonary disease. 2. Status post removal of the endotracheal tube since the prior exam. Electronically Signed   By: Lajean Manes M.D.   On: 05/20/2017 10:05   Dg Chest Port 1 View  Result Date: 05/11/2017 CLINICAL DATA:  Respiratory failure EXAM: PORTABLE CHEST 1 VIEW COMPARISON:  05/10/2017. FINDINGS:  Support tubes and lines are stable. ET tube 3.6 cm above carina. Normal heart size. Thoracic atherosclerosis. No consolidation or edema. IMPRESSION: Stable chest. Electronically Signed   By: Staci Righter M.D.   On: 05/11/2017 07:44   Dg Chest Port 1 View  Result Date: 05/10/2017 CLINICAL DATA:  Hypoxia EXAM: PORTABLE CHEST 1 VIEW COMPARISON:  May 08, 2017 FINDINGS: Endotracheal tube tip is 2.3 cm above the carina. Feeding tube tip is below the diaphragm. No pneumothorax. There is no edema or consolidation. The heart size and pulmonary vascularity are normal. No adenopathy. There is aortic atherosclerosis. IMPRESSION: Positions as described without pneumothorax. No edema or consolidation. There is aortic atherosclerosis. Aortic Atherosclerosis (ICD10-I70.0). Electronically Signed   By: Lowella Grip III M.D.   On: 05/10/2017 08:18   Dg Chest Port 1 View  Result Date: 05/08/2017 CLINICAL DATA:  Respiratory failure EXAM: PORTABLE CHEST 1 VIEW COMPARISON:  Three days ago FINDINGS: New retrocardiac opacity with volume loss. Normal heart size and mediastinal contours. Endotracheal tube tip is between the clavicular heads and carina. Feeding tube at least reaches the stomach. The left lung is clear. Artifact from EKG leads. IMPRESSION: Interval left lower lobe collapse. Electronically Signed   By: Monte Fantasia M.D.   On: 05/08/2017 08:14   Dg Chest Port 1 View  Result Date: 05/05/2017 CLINICAL DATA:  Failure to wing from mechanical ventilation. EXAM: PORTABLE CHEST 1 VIEW COMPARISON:  Portable chest x-ray of May 02, 2017 FINDINGS: The lungs are adequately inflated and clear. There is no pneumothorax or pleural effusion. The heart and pulmonary vascularity are normal. There is calcification in the wall of the aortic arch. The feeding tube tip projects below the inferior margin of the image. The endotracheal tube tip lies 3 cm above the carina. IMPRESSION: No evidence of pneumonia nor CHF. Thoracic  aortic atherosclerosis. The support tubes are in reasonable position. Electronically Signed   By: David  Martinique M.D.   On: 05/05/2017 09:43   Dg Chest Port 1 View  Result Date: 05/02/2017 CLINICAL DATA:  Hypoxia. EXAM: PORTABLE CHEST 1 VIEW COMPARISON:  04/30/2017. FINDINGS: Normal sized heart. Clear lungs with normal vascularity. Minimal scoliosis. Endotracheal tube in satisfactory position. Feeding tube extending into the stomach. Cholecystectomy clips. IMPRESSION: No acute abnormality. Electronically Signed   By: Claudie Revering M.D.   On: 05/02/2017 10:51   Dg  Chest Port 1 View  Result Date: 04/30/2017 CLINICAL DATA:  Leukocytosis, arm swelling EXAM: PORTABLE CHEST 1 VIEW COMPARISON:  04/29/2017 FINDINGS: There is an endotracheal tube with the tip 3.2 cm above the carina. There is a nasogastric tube coursing below the diaphragm. There is no focal parenchymal opacity. There is no pleural effusion or pneumothorax. The heart and mediastinal contours are unremarkable. The osseous structures are unremarkable. IMPRESSION: 1. Support lines and tubing in satisfactory position. 2. No acute cardiopulmonary disease. Electronically Signed   By: Kathreen Devoid   On: 04/30/2017 14:40   Dg Chest Port 1 View  Result Date: 04/29/2017 CLINICAL DATA:  Respiratory failure EXAM: PORTABLE CHEST 1 VIEW COMPARISON:  04/27/2017 FINDINGS: Endotracheal tube in good position. Feeding tube enters the stomach with the tip not visualized. Progression of left lower lobe atelectasis. Negative for edema or effusion. Right lung clear. IMPRESSION: Endotracheal tube in good position. Progressive left lower lobe atelectasis. Electronically Signed   By: Franchot Gallo M.D.   On: 04/29/2017 11:09   Dg Chest Port 1 View  Result Date: 04/27/2017 CLINICAL DATA:  Acute encephalopathy, cerebral edema, intracranial neoplasm, paroxysmal atrial fibrillation EXAM: PORTABLE CHEST 1 VIEW COMPARISON:  Portable chest x-ray of April 24, 2017 FINDINGS:  The lungs are well-expanded. There are slightly increased lung markings in the left infrahilar region. There is no pleural effusion. The heart and pulmonary vascularity are normal. There is calcification in the wall of the aortic arch. The feeding tube tip projects below the inferior margin of the image. The endotracheal tube tip projects 4.2 cm above the carina. IMPRESSION: Probable developing subsegmental atelectasis in the left lower lobe. Otherwise stable appearing chest since the earlier study with exception of interval placement of a feeding tube whose radiodense tip projects below the inferior margin of the image. Thoracic aortic atherosclerosis. Electronically Signed   By: David  Martinique M.D.   On: 04/27/2017 07:36   Portable Chest Xray  Result Date: 04/24/2017 CLINICAL DATA:  Respiratory insufficiency. EXAM: PORTABLE CHEST 1 VIEW COMPARISON:  04/23/2017. FINDINGS: Unchanged endotracheal tube. Normal cardiomediastinal silhouette. Clear lung fields. No pneumothorax or effusion. IMPRESSION: No active disease. Electronically Signed   By: Staci Righter M.D.   On: 04/24/2017 08:57   Portable Chest X-ray  Result Date: 04/23/2017 CLINICAL DATA:  Endotracheal tube placement. EXAM: PORTABLE CHEST 1 VIEW COMPARISON:  Radiograph of September 17, 2015. FINDINGS: The heart size and mediastinal contours are within normal limits. Endotracheal tube is seen projected over tracheal air shadow with distal tip 4 cm above the carina. Atherosclerosis of thoracic aorta is noted. No pneumothorax or pleural effusion is noted. Both lungs are clear. Minimally displaced right posterior fourth rib fracture is noted. IMPRESSION: Endotracheal tube in grossly good position. Probable minimally displaced right posterior fourth rib fracture. No other cardiopulmonary abnormality seen. Aortic Atherosclerosis (ICD10-I70.0). Electronically Signed   By: Marijo Conception, M.D.   On: 04/23/2017 14:36   Dg Swallowing Func-speech  Pathology  Result Date: 05/17/2017 Objective Swallowing Evaluation: Type of Study: MBS-Modified Barium Swallow Study  Patient Details Name: DEBY ADGER MRN: [id number] Date of Birth: 30-Oct-1950 Today's Date: 05/17/2017 Time: SLP Start Time (ACUTE ONLY): 1340 -SLP Stop Time (ACUTE ONLY): 1405 SLP Time Calculation (min) (ACUTE ONLY): 25 min Past Medical History: Past Medical History: Diagnosis Date . Blindness  . Diabetes mellitus type I ([place])  . GERD (gastroesophageal reflux disease)  . Hyperlipidemia  . Paroxysmal atrial fibrillation ([facility])  . Schizophrenia ([place])  . Sleep apnea  .  Subarachnoid hemorrhage ([place])  . Systemic hypertension  . Vertigo  Past Surgical History: Past Surgical History: Procedure Laterality Date . BRAIN TUMOR EXCISION   . CRANIOTOMY Left 04/23/2017  Procedure: CRANIOTOMY TUMOR EXCISION;  Surgeon: Earnie Larsson, MD;  Location: [facility];  Service: Neurosurgery;  Laterality: Left;  left . NM MYOCAR PERF WALL MOTION  02/01/2007  no significant ischemia . US ECHOCARDIOGRAPHY  12/20/2003  mild mitral annular ca+,mild MR,TR,PI,AOV mildly sclerotic HPI: 67 year old female with history of Rosai-Dorfman's disease as well as diabetes mellitus and schizophrenia presents with worsening right-sided weakness and aphasia.  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  Patient admitted for treatment with IV steroids and possible later surgical resection.  No history of seizure.  Patient has a history of blindness.  She is status post a remote occipital craniectomy and resection of extradural lesion years ago. MRI shows LEFT dural thickening with 2 dural-based masses again noted. Anterior temporal extra-axial solidly enhancing mass was 16 x 24 mm, now 15 x 21 mm. The more posterior mass was 0.7 x 1.2 cm, now 2.5 x 3.3 cm corresponding to CT abnormality. Mass effect on LEFT temporal lobes with severe T2  bright presume vasogenic edema LEFT temporal frontal and parietal lobes including posterior limb of the internal capsule. Regional mass effect with 3 mm new LEFT-to-RIGHT midline shift. LEFT uncal herniation. Partial effacement LEFT lateral ventricle without RIGHT ventricle entrapment or, hydrocephalus. Hemosiderin staining mesial LEFT parietal lobe at site of prior hemorrhage. Numerous scattered chronic micro hemorrhages within supra-and infratentorial brain. Moderate to severe parenchymal brain volume loss. No abnormal extra-axial fluid collections. Pt had BSE earlier this admission on 3/13 with primarily cognitively-based dysphagia with limited oral acceptance of POs. With cueing and precautions, it was recommended that she started with sips of water only. Pt was then intubated 3/15 for L craniotomy. Extubation was attempted after the procedure but she required reintubation. Pt then remained intubated until one-way extubation 4/3.  Subjective: pt is alert and keeps her eyes open but makes no attempts to speak Assessment / Plan / Recommendation CHL IP CLINICAL IMPRESSIONS 05/17/2017 Clinical Impression Patient presents with a moderate oropharyngeal dysphagia, exacerbated by acute changes in mentation. Patient initially asleep, requiring max verbal and tactile cueing to arouse enough for po intake, including completion of oral care. Once aroused however, and despite maintaining eyes closed throughout exam, patient willingly opens mouth for intake of bolus with gentle tactile cue to bottom lip. Oral transit then largely normal with both pureed solids and liquids with only intermittently delayed oral transit. Expectoration of soft solid bolus noted. Patient able to protect airway with pureed solids and nectar thick liquids via tsp, penetrating deeply and with question of aspiration (difficult to view given poor posture) of nectar thick and thin liquid via straw. Only subtle, non-productive throat clear in response.   Unfortunately, patient unable to take cup sips due to combination of poor head control and decreased awareness. Additionally, mild residuals remain post swallow. Although mentation significantly increases risk of aspiration, if family wishes, would consider initiation of pureed solids and nectar thick liquids via spoon when alert. SLP will f/u.  SLP Visit Diagnosis Dysphagia, oropharyngeal phase (R13.12) Attention and concentration deficit following -- Frontal lobe and executive function deficit following -- Impact on safety and function Moderate aspiration risk;Severe aspiration risk   CHL IP TREATMENT RECOMMENDATION 05/17/2017 Treatment Recommendations Therapy as outlined in  treatment plan below   Prognosis 05/17/2017 Prognosis for Safe Diet Advancement Fair Barriers to Reach Goals Cognitive deficits;Other (Comment) Barriers/Prognosis Comment -- CHL IP DIET RECOMMENDATION 05/17/2017 SLP Diet Recommendations Dysphagia 1 (Puree) solids;Nectar thick liquid Liquid Administration via Spoon Medication Administration Crushed with puree Compensations Slow rate;Small sips/bites Postural Changes Seated upright at 90 degrees   CHL IP OTHER RECOMMENDATIONS 05/17/2017 Recommended Consults -- Oral Care Recommendations Oral care BID Other Recommendations Order thickener from pharmacy;Prohibited food (jello, ice cream, thin soups);Remove water pitcher   CHL IP FOLLOW UP RECOMMENDATIONS 05/17/2017 Follow up Recommendations (No Data)   CHL IP FREQUENCY AND DURATION 05/17/2017 Speech Therapy Frequency (ACUTE ONLY) min 3x week Treatment Duration 2 weeks      CHL IP ORAL PHASE 05/17/2017 Oral Phase WFL Oral - Pudding Teaspoon -- Oral - Pudding Cup -- Oral - Honey Teaspoon -- Oral - Honey Cup -- Oral - Nectar Teaspoon -- Oral - Nectar Cup -- Oral - Nectar Straw -- Oral - Thin Teaspoon -- Oral - Thin Cup -- Oral - Thin Straw -- Oral - Puree -- Oral - Mech Soft -- Oral - Regular -- Oral - Multi-Consistency -- Oral - Pill -- Oral Phase - Comment --   CHL IP PHARYNGEAL PHASE 05/17/2017 Pharyngeal Phase Impaired Pharyngeal- Pudding Teaspoon -- Pharyngeal -- Pharyngeal- Pudding Cup -- Pharyngeal -- Pharyngeal- Honey Teaspoon Delayed swallow initiation-vallecula;Reduced tongue base retraction;Pharyngeal residue - valleculae Pharyngeal -- Pharyngeal- Honey Cup -- Pharyngeal -- Pharyngeal- Nectar Teaspoon Delayed swallow initiation-vallecula;Reduced tongue base retraction;Pharyngeal residue - valleculae Pharyngeal -- Pharyngeal- Nectar Cup -- Pharyngeal -- Pharyngeal- Nectar Straw Delayed swallow initiation-vallecula;Reduced tongue base retraction;Pharyngeal residue - valleculae;Penetration/Aspiration during swallow Pharyngeal Material enters airway, remains ABOVE vocal cords and not ejected out Pharyngeal- Thin Teaspoon Delayed swallow initiation-pyriform sinuses;Reduced tongue base retraction;Pharyngeal residue - valleculae Pharyngeal -- Pharyngeal- Thin Cup -- Pharyngeal -- Pharyngeal- Thin Straw Delayed swallow initiation-vallecula;Reduced tongue base retraction;Pharyngeal residue - valleculae;Penetration/Aspiration during swallow Pharyngeal Material enters airway, CONTACTS cords and not ejected out Pharyngeal- Puree Delayed swallow initiation-vallecula;Reduced tongue base retraction;Pharyngeal residue - valleculae Pharyngeal -- Pharyngeal- Mechanical Soft -- Pharyngeal -- Pharyngeal- Regular -- Pharyngeal -- Pharyngeal- Multi-consistency -- Pharyngeal -- Pharyngeal- Pill -- Pharyngeal -- Pharyngeal Comment --  Gabriel Rainwater MA, CCC-SLP [phone] [place] Leah Meryl 05/17/2017, 2:41 PM               Craniectomy 3/15   EEG 3/19  TTE 3/21, EF 65-70%, moderate LVH  Subjective: Seen and examined the patient is unable to interact and give subjective history secondary to her current condition.  She opened her eyes to physical stimuli.  Husband at bedside and after discussion with him he does not want a PEG tube and will not pursue Cortrak feeding at home.  He  understands the risks that patient's nutritional goals may not be met and that she has a high risk for aspirating he understands these risks.  He had no other questions or complaints and all questions were answered to patient's husband satisfaction.  She will be going home with home health at this time  Discharge Exam: Vitals:   05/20/17 2101 05/21/17 0458  BP: 126/86 123/79  Pulse: 94 94  Resp: 15 15  Temp: 99.2 F (37.3 C) (!) 97.4 F (36.3 C)  SpO2: 98% 99%   Vitals:   05/20/17 1410 05/20/17 2101 05/21/17 0458 05/21/17 0500  BP: 124/78 126/86 123/79   Pulse: (!) 103 94 94   Resp: _0 Temp: (!) 97.3 F (  36.3 C) 99.2 F (37.3 C) (!) 97.4 F (36.3 C)   TempSrc: Oral Oral Oral   SpO2: 100% 98% 99%   Weight:    49 kg (108 lb 0.4 oz)  Height:       General: Pt is awake, not in acute distress; she is thin cachectic and chronically ill-appearing Cardiovascular: RRR, S1/S2 +, no rubs, no gallops Respiratory: Diminished bilaterally, no wheezing, no rhonchi Abdominal: Soft, NT, ND, bowel sounds + Extremities: no edema, no cyanosis  The results of significant diagnostics from this hospitalization (including imaging, microbiology, ancillary and laboratory) are listed below for reference.    Microbiology: Recent Results (from the past 240 hour(s))  Culture, blood (routine x 2)     Status: None (Preliminary result)   Collection Time: 05/20/17  9:27 AM  Result Value Ref Range Status   Specimen Description BLOOD RIGHT ANTECUBITAL  Final   Special Requests   Final    BOTTLES DRAWN AEROBIC AND ANAEROBIC Blood Culture adequate volume   Culture   Final    NO GROWTH 1 DAY Performed at [facility], [address] [place], [place] [address]    Report Status PENDING  Incomplete  Culture, blood (routine x 2)     Status: None (Preliminary result)   Collection Time: 05/20/17  9:30 AM  Result Value Ref Range Status   Specimen Description BLOOD LEFT HAND  Final   Special  Requests   Final    BOTTLES DRAWN AEROBIC AND ANAEROBIC Blood Culture adequate volume   Culture   Final    NO GROWTH 1 DAY Performed at [facility], [place] [address], [place], [place] [address]    Report Status PENDING  Incomplete    Labs: BNP (last 3 results) No results for input(s): BNP in the last 8760 hours. Basic Metabolic Panel: Recent Labs  Lab 05/17/17 0712 05/18/17 0744 05/19/17 0925 05/20/17 0421 05/21/17 0725  NA  --  142 145 143 141  K  --  4.3 4.0 3.8 4.2  CL  --  106 111 110 108  CO2  --  _0 GLUCOSE  --  182* 217* 248* 214*  BUN  --  _1 CREATININE  --  0.51 0.58 0.50 0.41*  CALCIUM 11.2* 11.4* 10.2 10.3 10.0  MG  --   --  1.9 2.0 1.9  PHOS  --   --  2.1* 2.0* 2.5   Liver Function Tests: Recent Labs  Lab 05/18/17 0744 05/19/17 0925 05/20/17 0421 05/21/17 0725  AST 71* _2 ALT 86* 61* 48 40  ALKPHOS 103 85 79 65  BILITOT 0.4 0.4 0.4 0.4  PROT 5.1* 4.8* 4.8* 4.8*  ALBUMIN 2.5* 2.3* 2.3* 2.2*   No results for input(s): LIPASE, AMYLASE in the last 168 hours. No results for input(s): AMMONIA in the last 168 hours. CBC: Recent Labs  Lab 05/18/17 0744 05/19/17 0925 05/20/17 0421 05/21/17 0725  WBC 6.0 5.4 13.9* 7.9  NEUTROABS  --  2.8 10.3* 4.2  HGB 12.0 10.8* 11.7* 12.2  HCT 37.3 33.4* 36.9 36.5  MCV 95.6 97.4 98.4 96.8  PLT 150 164 173 189   Cardiac Enzymes: No results for input(s): CKTOTAL, CKMB, CKMBINDEX, TROPONINI in the last 168 hours. BNP: Invalid input(s): POCBNP CBG: Recent Labs  Lab 05/20/17 1938 05/21/17 0004 05/21/17 0406 05/21/17 0801 05/21/17 1201  GLUCAP 252* 242* 134* 227* 178*   D-Dimer No results for  input(s): DDIMER in the last 72 hours. Hgb A1c No results for input(s): HGBA1C in the last 72 hours. Lipid Profile No results for input(s): CHOL, HDL, LDLCALC, TRIG, CHOLHDL, LDLDIRECT in the last 72 hours. Thyroid function studies No results for input(s): TSH, T4TOTAL, T3FREE,  THYROIDAB in the last 72 hours.  Invalid input(s): FREET3 Anemia work up No results for input(s): VITAMINB12, FOLATE, FERRITIN, TIBC, IRON, RETICCTPCT in the last 72 hours. Urinalysis    Component Value Date/Time   COLORURINE YELLOW 05/20/2017 2044   APPEARANCEUR HAZY (A) 05/20/2017 2044   LABSPEC 1.016 05/20/2017 2044   PHURINE 6.0 05/20/2017 2044   GLUCOSEU NEGATIVE 05/20/2017 2044   HGBUR NEGATIVE 05/20/2017 2044   BILIRUBINUR NEGATIVE 05/20/2017 2044   KETONESUR NEGATIVE 05/20/2017 2044   PROTEINUR NEGATIVE 05/20/2017 2044   UROBILINOGEN 0.2 12/04/2013 1518   NITRITE POSITIVE (A) 05/20/2017 2044   LEUKOCYTESUR MODERATE (A) 05/20/2017 2044   Sepsis Labs Invalid input(s): PROCALCITONIN,  WBC,  LACTICIDVEN Microbiology Recent Results (from the past 240 hour(s))  Culture, blood (routine x 2)     Status: None (Preliminary result)   Collection Time: 05/20/17  9:27 AM  Result Value Ref Range Status   Specimen Description BLOOD RIGHT ANTECUBITAL  Final   Special Requests   Final    BOTTLES DRAWN AEROBIC AND ANAEROBIC Blood Culture adequate volume   Culture   Final    NO GROWTH 1 DAY Performed at [facility], [place] [address] [place], [place] [address]    Report Status PENDING  Incomplete  Culture, blood (routine x 2)     Status: None (Preliminary result)   Collection Time: 05/20/17  9:30 AM  Result Value Ref Range Status   Specimen Description BLOOD LEFT HAND  Final   Special Requests   Final    BOTTLES DRAWN AEROBIC AND ANAEROBIC Blood Culture adequate volume   Culture   Final    NO GROWTH 1 DAY Performed at [facility], [place] [address] [place], [place] [address]    Report Status PENDING  Incomplete   Time coordinating discharge: 35 minutes  SIGNED:  Kerney Elbe, DO [facility] 05/21/2017, 1:38 PM Pager [phone]  If 7PM-7AM, please contact night-coverage [email or web address] Password [email or web address]

## 2017-05-24 LAB — URINE CULTURE

## 2017-05-25 LAB — CULTURE, BLOOD (ROUTINE X 2)
Culture: NO GROWTH
Culture: NO GROWTH
SPECIAL REQUESTS: ADEQUATE
Special Requests: ADEQUATE

## 2017-05-31 ENCOUNTER — Inpatient Hospital Stay (HOSPITAL_COMMUNITY): Payer: BLUE CROSS/BLUE SHIELD

## 2017-05-31 ENCOUNTER — Other Ambulatory Visit: Payer: Self-pay

## 2017-05-31 ENCOUNTER — Emergency Department (HOSPITAL_COMMUNITY): Payer: BLUE CROSS/BLUE SHIELD

## 2017-05-31 ENCOUNTER — Inpatient Hospital Stay (HOSPITAL_COMMUNITY)
Admission: EM | Admit: 2017-05-31 | Discharge: 2017-06-04 | DRG: 871 | Disposition: A | Discharge status: Home / Self Care | Payer: BLUE CROSS/BLUE SHIELD | Attending: Internal Medicine | Admitting: Internal Medicine

## 2017-05-31 ENCOUNTER — Encounter (HOSPITAL_COMMUNITY): Payer: Self-pay | Admitting: Emergency Medicine

## 2017-05-31 DIAGNOSIS — R6510 Systemic inflammatory response syndrome (SIRS) of non-infectious origin without acute organ dysfunction: Secondary | ICD-10-CM | POA: Diagnosis not present

## 2017-05-31 DIAGNOSIS — R262 Difficulty in walking, not elsewhere classified: Secondary | ICD-10-CM | POA: Diagnosis present

## 2017-05-31 DIAGNOSIS — E119 Type 2 diabetes mellitus without complications: Secondary | ICD-10-CM | POA: Diagnosis not present

## 2017-05-31 DIAGNOSIS — N390 Urinary tract infection, site not specified: Secondary | ICD-10-CM | POA: Diagnosis not present

## 2017-05-31 DIAGNOSIS — D763 Other histiocytosis syndromes: Secondary | ICD-10-CM | POA: Diagnosis not present

## 2017-05-31 DIAGNOSIS — J690 Pneumonitis due to inhalation of food and vomit: Secondary | ICD-10-CM

## 2017-05-31 DIAGNOSIS — G4733 Obstructive sleep apnea (adult) (pediatric): Secondary | ICD-10-CM | POA: Diagnosis present

## 2017-05-31 DIAGNOSIS — K219 Gastro-esophageal reflux disease without esophagitis: Secondary | ICD-10-CM | POA: Diagnosis present

## 2017-05-31 DIAGNOSIS — Z886 Allergy status to analgesic agent status: Secondary | ICD-10-CM

## 2017-05-31 DIAGNOSIS — I480 Paroxysmal atrial fibrillation: Secondary | ICD-10-CM | POA: Diagnosis present

## 2017-05-31 DIAGNOSIS — F329 Major depressive disorder, single episode, unspecified: Secondary | ICD-10-CM | POA: Diagnosis present

## 2017-05-31 DIAGNOSIS — F209 Schizophrenia, unspecified: Secondary | ICD-10-CM | POA: Diagnosis present

## 2017-05-31 DIAGNOSIS — R000 Tachycardia, unspecified: Secondary | ICD-10-CM | POA: Diagnosis not present

## 2017-05-31 DIAGNOSIS — F419 Anxiety disorder, unspecified: Secondary | ICD-10-CM | POA: Diagnosis present

## 2017-05-31 DIAGNOSIS — R1310 Dysphagia, unspecified: Secondary | ICD-10-CM | POA: Diagnosis present

## 2017-05-31 DIAGNOSIS — G40909 Epilepsy, unspecified, not intractable, without status epilepticus: Secondary | ICD-10-CM | POA: Diagnosis present

## 2017-05-31 DIAGNOSIS — I69351 Hemiplegia and hemiparesis following cerebral infarction affecting right dominant side: Secondary | ICD-10-CM

## 2017-05-31 DIAGNOSIS — I10 Essential (primary) hypertension: Secondary | ICD-10-CM | POA: Diagnosis present

## 2017-05-31 DIAGNOSIS — Z515 Encounter for palliative care: Secondary | ICD-10-CM | POA: Diagnosis not present

## 2017-05-31 DIAGNOSIS — A419 Sepsis, unspecified organism: Principal | ICD-10-CM | POA: Diagnosis present

## 2017-05-31 DIAGNOSIS — Z7401 Bed confinement status: Secondary | ICD-10-CM

## 2017-05-31 DIAGNOSIS — E43 Unspecified severe protein-calorie malnutrition: Secondary | ICD-10-CM | POA: Diagnosis present

## 2017-05-31 DIAGNOSIS — R627 Adult failure to thrive: Secondary | ICD-10-CM | POA: Diagnosis present

## 2017-05-31 DIAGNOSIS — I6990 Unspecified sequelae of unspecified cerebrovascular disease: Secondary | ICD-10-CM | POA: Diagnosis not present

## 2017-05-31 DIAGNOSIS — E785 Hyperlipidemia, unspecified: Secondary | ICD-10-CM | POA: Diagnosis present

## 2017-05-31 DIAGNOSIS — R64 Cachexia: Secondary | ICD-10-CM | POA: Diagnosis present

## 2017-05-31 DIAGNOSIS — R531 Weakness: Secondary | ICD-10-CM | POA: Diagnosis present

## 2017-05-31 DIAGNOSIS — Z452 Encounter for adjustment and management of vascular access device: Secondary | ICD-10-CM

## 2017-05-31 DIAGNOSIS — Z86011 Personal history of benign neoplasm of the brain: Secondary | ICD-10-CM

## 2017-05-31 DIAGNOSIS — T4275XA Adverse effect of unspecified antiepileptic and sedative-hypnotic drugs, initial encounter: Secondary | ICD-10-CM | POA: Diagnosis present

## 2017-05-31 DIAGNOSIS — Z681 Body mass index (BMI) 19 or less, adult: Secondary | ICD-10-CM | POA: Diagnosis not present

## 2017-05-31 DIAGNOSIS — H547 Unspecified visual loss: Secondary | ICD-10-CM | POA: Diagnosis present

## 2017-05-31 DIAGNOSIS — Z7189 Other specified counseling: Secondary | ICD-10-CM | POA: Diagnosis not present

## 2017-05-31 DIAGNOSIS — I509 Heart failure, unspecified: Secondary | ICD-10-CM | POA: Diagnosis not present

## 2017-05-31 DIAGNOSIS — E876 Hypokalemia: Secondary | ICD-10-CM | POA: Diagnosis present

## 2017-05-31 DIAGNOSIS — G9341 Metabolic encephalopathy: Secondary | ICD-10-CM

## 2017-05-31 DIAGNOSIS — Z882 Allergy status to sulfonamides status: Secondary | ICD-10-CM

## 2017-05-31 DIAGNOSIS — Z66 Do not resuscitate: Secondary | ICD-10-CM | POA: Diagnosis present

## 2017-05-31 DIAGNOSIS — Z91013 Allergy to seafood: Secondary | ICD-10-CM

## 2017-05-31 DIAGNOSIS — Z79899 Other long term (current) drug therapy: Secondary | ICD-10-CM

## 2017-05-31 DIAGNOSIS — Z794 Long term (current) use of insulin: Secondary | ICD-10-CM

## 2017-05-31 DIAGNOSIS — B9620 Unspecified Escherichia coli [E. coli] as the cause of diseases classified elsewhere: Secondary | ICD-10-CM | POA: Diagnosis present

## 2017-05-31 LAB — CBC WITH DIFFERENTIAL/PLATELET
BASOS PCT: 0 %
Basophils Absolute: 0 10*3/uL (ref 0.0–0.1)
EOS ABS: 0 10*3/uL (ref 0.0–0.7)
Eosinophils Relative: 0 %
HCT: 42.5 % (ref 36.0–46.0)
HEMOGLOBIN: 13.8 g/dL (ref 12.0–15.0)
LYMPHS ABS: 2 10*3/uL (ref 0.7–4.0)
Lymphocytes Relative: 8 %
MCH: 31 pg (ref 26.0–34.0)
MCHC: 32.5 g/dL (ref 30.0–36.0)
MCV: 95.5 fL (ref 78.0–100.0)
MONO ABS: 1.8 10*3/uL — AB (ref 0.1–1.0)
MONOS PCT: 7 %
NEUTROS PCT: 85 %
Neutro Abs: 21.1 10*3/uL — ABNORMAL HIGH (ref 1.7–7.7)
Platelets: 301 10*3/uL (ref 150–400)
RBC: 4.45 MIL/uL (ref 3.87–5.11)
RDW: 14.2 % (ref 11.5–15.5)
WBC: 24.8 10*3/uL — ABNORMAL HIGH (ref 4.0–10.5)

## 2017-05-31 LAB — D-DIMER, QUANTITATIVE: D-Dimer, Quant: 3.39 ug/mL-FEU — ABNORMAL HIGH (ref 0.00–0.50)

## 2017-05-31 LAB — COMPREHENSIVE METABOLIC PANEL
ALBUMIN: 3.1 g/dL — AB (ref 3.5–5.0)
ALK PHOS: 82 U/L (ref 38–126)
ALT: 19 U/L (ref 14–54)
ANION GAP: 13 (ref 5–15)
AST: 18 U/L (ref 15–41)
BILIRUBIN TOTAL: 1.3 mg/dL — AB (ref 0.3–1.2)
BUN: 15 mg/dL (ref 6–20)
CALCIUM: 11.4 mg/dL — AB (ref 8.9–10.3)
CO2: 24 mmol/L (ref 22–32)
Chloride: 101 mmol/L (ref 101–111)
Creatinine, Ser: 0.75 mg/dL (ref 0.44–1.00)
GFR calc non Af Amer: >60 mL/min (ref >60)
Glucose, Bld: 151 mg/dL — ABNORMAL HIGH (ref 65–99)
POTASSIUM: 3.2 mmol/L — AB (ref 3.5–5.1)
SODIUM: 138 mmol/L (ref 135–145)
TOTAL PROTEIN: 6.4 g/dL — AB (ref 6.5–8.1)

## 2017-05-31 LAB — URINALYSIS, ROUTINE W REFLEX MICROSCOPIC
BILIRUBIN URINE: NEGATIVE
GLUCOSE, UA: NEGATIVE mg/dL
KETONES UR: 20 mg/dL — AB
NITRITE: NEGATIVE
PH: 5 (ref 5.0–8.0)
Protein, ur: 100 mg/dL — AB
SPECIFIC GRAVITY, URINE: 1.019 (ref 1.005–1.030)
Squamous Epithelial / LPF: NONE SEEN

## 2017-05-31 LAB — I-STAT CG4 LACTIC ACID, ED: Lactic Acid, Venous: 2.36 mmol/L (ref 0.5–1.9)

## 2017-05-31 LAB — BRAIN NATRIURETIC PEPTIDE: B Natriuretic Peptide: 63 pg/mL (ref 0.0–100.0)

## 2017-05-31 LAB — PHOSPHORUS: PHOSPHORUS: 3.4 mg/dL (ref 2.5–4.6)

## 2017-05-31 LAB — MAGNESIUM: Magnesium: 2.4 mg/dL (ref 1.7–2.4)

## 2017-05-31 MED ORDER — PANTOPRAZOLE SODIUM 40 MG PO TBEC
40.0000 mg | DELAYED_RELEASE_TABLET | Freq: Every day | ORAL | Status: DC
  Administered 2017-06-01 – 2017-06-04 (×4): 40 mg via ORAL
  Filled 2017-05-31 (×3): qty 1

## 2017-05-31 MED ORDER — SODIUM CHLORIDE 0.9 % IV BOLUS
30.0000 mL/kg | Freq: Once | INTRAVENOUS | Status: AC
  Administered 2017-05-31: 1470 mL via INTRAVENOUS

## 2017-05-31 MED ORDER — BISACODYL 5 MG PO TBEC
5.0000 mg | DELAYED_RELEASE_TABLET | Freq: Every day | ORAL | Status: DC
  Administered 2017-06-01 – 2017-06-04 (×3): 5 mg via ORAL
  Filled 2017-05-31 (×3): qty 1

## 2017-05-31 MED ORDER — DILTIAZEM HCL ER COATED BEADS 180 MG PO CP24
180.0000 mg | ORAL_CAPSULE | Freq: Every day | ORAL | Status: DC
  Administered 2017-06-01 – 2017-06-04 (×4): 180 mg via ORAL
  Filled 2017-05-31 (×4): qty 1

## 2017-05-31 MED ORDER — LEVETIRACETAM 500 MG PO TABS
500.0000 mg | ORAL_TABLET | Freq: Two times a day (BID) | ORAL | Status: DC
  Administered 2017-05-31 – 2017-06-04 (×7): 500 mg via ORAL
  Filled 2017-05-31 (×8): qty 1

## 2017-05-31 MED ORDER — VANCOMYCIN HCL IN DEXTROSE 1-5 GM/200ML-% IV SOLN
1000.0000 mg | INTRAVENOUS | Status: DC
  Administered 2017-06-02: 1000 mg via INTRAVENOUS
  Filled 2017-05-31 (×2): qty 200

## 2017-05-31 MED ORDER — PIPERACILLIN-TAZOBACTAM 3.375 G IVPB
3.3750 g | Freq: Three times a day (TID) | INTRAVENOUS | Status: DC
  Administered 2017-06-01 – 2017-06-03 (×6): 3.375 g via INTRAVENOUS
  Filled 2017-05-31 (×7): qty 50

## 2017-05-31 MED ORDER — ROSUVASTATIN CALCIUM 10 MG PO TABS
10.0000 mg | ORAL_TABLET | Freq: Every evening | ORAL | Status: DC
  Administered 2017-05-31 – 2017-06-03 (×3): 10 mg via ORAL
  Filled 2017-05-31 (×3): qty 1

## 2017-05-31 MED ORDER — TEMAZEPAM 15 MG PO CAPS: 15.0000 mg | ORAL_CAPSULE | Freq: Every evening | ORAL | Status: DC | PRN

## 2017-05-31 MED ORDER — ALPRAZOLAM 0.25 MG PO TABS
0.2500 mg | ORAL_TABLET | Freq: Three times a day (TID) | ORAL | Status: DC | PRN
Start: 2017-05-31 — End: 2017-06-04
  Administered 2017-05-31: 0.25 mg via ORAL
  Filled 2017-05-31: qty 1

## 2017-05-31 MED ORDER — ACETAMINOPHEN 325 MG PO TABS: 650.0000 mg | ORAL_TABLET | Freq: Four times a day (QID) | ORAL | Status: DC | PRN

## 2017-05-31 MED ORDER — POTASSIUM CHLORIDE IN NACL 20-0.9 MEQ/L-% IV SOLN
INTRAVENOUS | Status: AC
  Administered 2017-06-01: via INTRAVENOUS

## 2017-05-31 MED ORDER — PAROXETINE HCL 20 MG PO TABS
40.0000 mg | ORAL_TABLET | Freq: Every day | ORAL | Status: DC
  Administered 2017-05-31 – 2017-06-03 (×3): 40 mg via ORAL
  Filled 2017-05-31 (×4): qty 2

## 2017-05-31 MED ORDER — VANCOMYCIN HCL IN DEXTROSE 1-5 GM/200ML-% IV SOLN
1000.0000 mg | Freq: Once | INTRAVENOUS | Status: AC
  Administered 2017-05-31: 1000 mg via INTRAVENOUS
  Filled 2017-05-31: qty 200

## 2017-05-31 MED ORDER — ALBUTEROL SULFATE (2.5 MG/3ML) 0.083% IN NEBU: 2.5000 mg | INHALATION_SOLUTION | RESPIRATORY_TRACT | Status: DC | PRN

## 2017-05-31 MED ORDER — ACETAMINOPHEN 650 MG RE SUPP: 650.0000 mg | Freq: Four times a day (QID) | RECTAL | Status: DC | PRN

## 2017-05-31 MED ORDER — RISPERIDONE 1 MG PO TABS
2.0000 mg | ORAL_TABLET | Freq: Every day | ORAL | Status: DC
  Administered 2017-05-31 – 2017-06-03 (×3): 2 mg via ORAL
  Filled 2017-05-31 (×4): qty 2

## 2017-05-31 MED ORDER — FAMOTIDINE 20 MG PO TABS
20.0000 mg | ORAL_TABLET | Freq: Every day | ORAL | Status: DC
  Administered 2017-06-01 – 2017-06-04 (×4): 20 mg via ORAL
  Filled 2017-05-31 (×4): qty 1

## 2017-05-31 MED ORDER — ASPIRIN EC 81 MG PO TBEC
81.0000 mg | DELAYED_RELEASE_TABLET | Freq: Every day | ORAL | Status: DC
  Administered 2017-06-01 – 2017-06-04 (×4): 81 mg via ORAL
  Filled 2017-05-31 (×4): qty 1

## 2017-05-31 MED ORDER — PIPERACILLIN-TAZOBACTAM 3.375 G IVPB 30 MIN
3.3750 g | Freq: Once | INTRAVENOUS | Status: AC
  Administered 2017-05-31: 3.375 g via INTRAVENOUS
  Filled 2017-05-31: qty 50

## 2017-05-31 MED ORDER — LORATADINE 10 MG PO TABS
10.0000 mg | ORAL_TABLET | Freq: Every day | ORAL | Status: DC | PRN
Start: 2017-05-31 — End: 2017-06-04

## 2017-05-31 NOTE — ED Provider Notes (Signed)
[facility] DEPT Provider Note: Georgena Spurling, MD, FACEP  CSN: [id number] MRN: [id number] ARRIVAL: 05/31/17 at [place]: [place]  Weakness  Level 5 Caveat: altered mental status HISTORY OF PRESENT ILLNESS  05/31/17 5:08 PM Rachel [name] is a 67 y.o. female who is status post stroke and brain tumor resection with resultant blindness and right hemiparesis.  She is here with 2 days of generalized weakness, increased somnolence, decreased oral intake and altered mental status.  Specifically she is nonverbal and not cooperative with meals.  He is not aware of her having a fever.  She was noted to be tachycardic on arrival with hypotension and decreased oxygen saturation.   Past Medical History:  Diagnosis Date  . Blindness   . Diabetes mellitus type I ([place])   . GERD (gastroesophageal reflux disease)   . Hyperlipidemia   . Paroxysmal atrial fibrillation (HCC)   . Schizophrenia ([place])   . Sleep apnea   . Subarachnoid hemorrhage ([place])   . Systemic hypertension   . Vertigo     Past Surgical History:  Procedure Laterality Date  . BRAIN TUMOR EXCISION    . CRANIOTOMY Left 04/23/2017   Procedure: CRANIOTOMY TUMOR EXCISION;  Surgeon: Earnie Larsson, MD;  Location: [facility];  Service: Neurosurgery;  Laterality: Left;  left  . NM MYOCAR PERF WALL MOTION  02/01/2007   no significant ischemia  . US ECHOCARDIOGRAPHY  12/20/2003   mild mitral annular ca+,mild MR,TR,PI,AOV mildly sclerotic    Family History  Problem Relation Age of Onset  . ADD / ADHD Neg Hx   . Alcohol abuse Neg Hx   . Drug abuse Neg Hx   . Anxiety disorder Neg Hx   . Bipolar disorder Neg Hx   . Dementia Neg Hx   . Depression Neg Hx   . OCD Neg Hx   . Paranoid behavior Neg Hx   . Schizophrenia Neg Hx   . Seizures Neg Hx   . Sexual abuse Neg Hx   . Physical abuse Neg Hx     Social History   Tobacco Use  . Smoking status: Never Smoker  . Smokeless tobacco: Never Used  Substance Use  Topics  . Alcohol use: No  . Drug use: No    Prior to Admission medications   Medication Sig Start Date End Date Taking? Authorizing Provider  acetaminophen (TYLENOL) 500 MG tablet Take 1,000 mg by mouth every 6 (six) hours as needed for mild pain.    [provider]  albuterol (PROVENTIL) (2.5 MG/3ML) 0.083% nebulizer solution Take 3 mLs (2.5 mg total) by nebulization every 4 (four) hours as needed for wheezing or shortness of breath. 05/21/17   Sheikh, Georgina Quint Latif, DO  ALPRAZolam Duanne Moron) 0.25 MG tablet TAKE 1 TABLET BY MOUTH THREE TIMES DAILY AS NEEDED FOR ANXIETY 03/29/17   Cloria Spring, MD  diltiazem (CARDIZEM CD) 180 MG 24 hr capsule Take 1 capsule (180 mg total) by mouth daily. 12/29/16   Croitoru, Mihai, MD  famotidine (PEPCID) 20 MG tablet Take 20 mg by mouth daily.  01/08/11   [provider]  insulin NPH Human (HUMULIN N,NOVOLIN N) 100 UNIT/ML injection Inject 0.18 mLs (18 Units total) into the skin 2 (two) times daily at 8 am and 10 pm. 09/26/15   Rosalin Hawking, MD  levETIRAcetam (KEPPRA) 500 MG tablet Take 1 tablet (500 mg total) by mouth 2 (two) times daily. 05/21/17   Raiford Noble Latif, DO  loratadine (CLARITIN)  10 MG tablet Take 10 mg by mouth daily as needed for allergies.  09/11/15   [provider]  multivitamin with minerals (CERTA-VITE) LIQD Take 5 mLs by mouth daily. 05/21/17   Sheikh, Omair Latif, DO  ondansetron (ZOFRAN) 4 MG tablet Take 1 tablet (4 mg total) by mouth every 6 (six) hours as needed for nausea. 05/21/17   Raiford Noble Latif, DO  pantoprazole (PROTONIX) 40 MG tablet Take 1 tablet (40 mg total) by mouth daily. 05/21/17 05/21/18  Raiford Noble Latif, DO  PARoxetine (PAXIL) 40 MG tablet Take 1 tablet (40 mg total) at bedtime by mouth. 12/22/16   Cloria Spring, MD  polyethylene glycol [facility] / Floria Raveling) packet Place 17 g into feeding tube daily. 05/22/17   Raiford Noble Latif, DO  risperiDONE (RISPERDAL) 2 MG tablet Take 1 tablet (2 mg  total) by mouth at bedtime. 10/22/16   Cloria Spring, MD  rosuvastatin (CRESTOR) 10 MG tablet Take 1 tablet (10 mg total) by mouth every evening. 12/29/16   Croitoru, Mihai, MD  temazepam (RESTORIL) 15 MG capsule Take 1 capsule (15 mg total) at bedtime as needed by mouth for sleep. 12/22/16   Cloria Spring, MD  traZODone (DESYREL) 50 MG tablet Take 1 tablet (50 mg total) by mouth at bedtime. 07/22/16   Cloria Spring, MD    Allergies Sulfa antibiotics; Ibuprofen; and Shellfish allergy   REVIEW OF SYSTEMS  Negative except as noted here or in the History of Present Illness.   PHYSICAL EXAMINATION  Initial Vital Signs Blood pressure (!) 77/41, pulse (!) 133, temperature 97.9 F (36.6 C), temperature source Tympanic, resp. rate 20, height 5\' 9"  (1.753 m), weight 49 kg (108 lb), SpO2 (!) 88 %.  Examination General: Well-developed, cachectic female in no acute distress; appearance consistent with age of record HENT: normocephalic; atraumatic Eyes: pupils equal, round and sluggish; leftward gaze Neck: supple Heart: regular rate and rhythm Lungs: clear to auscultation bilaterally Abdomen: soft; nondistended; nontender; no masses or hepatosplenomegaly; bowel sounds present Extremities: Contractures; pulses weak Neurologic: Lethargic, nonverbal; left hemiparesis Skin: Warm and dry   RESULTS  Summary of this visit's results, reviewed by myself:   EKG Interpretation  Date/Time:  Monday May 31 2017 16:40:40 EDT Ventricular Rate:  112 PR Interval:    QRS Duration: 88 QT Interval:  319 QTC Calculation: 436 R Axis:   -84 Text Interpretation:  Sinus tachycardia Right atrial enlargement Consider right ventricular hypertrophy Probable left ventricular hypertrophy Inferior infarct, old No significant change was found Confirmed by Shanon Rosser [phone]) on 05/31/2017 5:06:50 PM      Laboratory Studies: Results for orders placed or performed during the hospital encounter of 05/31/17  (from the past 24 hour(s))  CBC with Differential/Platelet     Status: Abnormal   Collection Time: 05/31/17  5:10 PM  Result Value Ref Range   WBC 24.8 (H) 4.0 - 10.5 K/uL   RBC 4.45 3.87 - 5.11 MIL/uL   Hemoglobin 13.8 12.0 - 15.0 g/dL   HCT 42.5 36.0 - 46.0 %   MCV 95.5 78.0 - 100.0 fL   MCH 31.0 26.0 - 34.0 pg   MCHC 32.5 30.0 - 36.0 g/dL   RDW 14.2 11.5 - 15.5 %   Platelets 301 150 - 400 K/uL   Neutrophils Relative % 85 %   Neutro Abs 21.1 (H) 1.7 - 7.7 K/uL   Lymphocytes Relative 8 %   Lymphs Abs 2.0 0.7 - 4.0 K/uL   Monocytes Relative  7 %   Monocytes Absolute 1.8 (H) 0.1 - 1.0 K/uL   Eosinophils Relative 0 %   Eosinophils Absolute 0.0 0.0 - 0.7 K/uL   Basophils Relative 0 %   Basophils Absolute 0.0 0.0 - 0.1 K/uL  Comprehensive metabolic panel     Status: Abnormal   Collection Time: 05/31/17  5:10 PM  Result Value Ref Range   Sodium 138 135 - 145 mmol/L   Potassium 3.2 (L) 3.5 - 5.1 mmol/L   Chloride 101 101 - 111 mmol/L   CO2 24 22 - 32 mmol/L   Glucose, Bld 151 (H) 65 - 99 mg/dL   BUN 15 6 - 20 mg/dL   Creatinine, Ser 0.75 0.44 - 1.00 mg/dL   Calcium 11.4 (H) 8.9 - 10.3 mg/dL   Total Protein 6.4 (L) 6.5 - 8.1 g/dL   Albumin 3.1 (L) 3.5 - 5.0 g/dL   AST 18 15 - 41 U/L   ALT 19 14 - 54 U/L   Alkaline Phosphatase 82 38 - 126 U/L   Total Bilirubin 1.3 (H) 0.3 - 1.2 mg/dL   GFR calc non Af Amer >60 >60 mL/min   GFR calc Af Amer >60 >60 mL/min   Anion gap 13 5 - 15  Urinalysis, Routine w reflex microscopic     Status: Abnormal   Collection Time: 05/31/17  5:10 PM  Result Value Ref Range   Color, Urine AMBER (A) YELLOW   APPearance CLOUDY (A) CLEAR   Specific Gravity, Urine 1.019 1.005 - 1.030   pH 5.0 5.0 - 8.0   Glucose, UA NEGATIVE NEGATIVE mg/dL   Hgb urine dipstick SMALL (A) NEGATIVE   Bilirubin Urine NEGATIVE NEGATIVE   Ketones, ur 20 (A) NEGATIVE mg/dL   Protein, ur 100 (A) NEGATIVE mg/dL   Nitrite NEGATIVE NEGATIVE   Leukocytes, UA TRACE (A) NEGATIVE     RBC / HPF 0-5 0 - 5 RBC/hpf   WBC, UA 6-30 0 - 5 WBC/hpf   Bacteria, UA MANY (A) NONE SEEN   Squamous Epithelial / LPF NONE SEEN NONE SEEN   Mucus PRESENT   I-Stat CG4 Lactic Acid, ED     Status: Abnormal   Collection Time: 05/31/17  5:27 PM  Result Value Ref Range   Lactic Acid, Venous 2.36 (HH) 0.5 - 1.9 mmol/L   Imaging Studies: Dg Chest Port 1 View  Result Date: 05/31/2017 CLINICAL DATA:  Rhonchi.  Low oxygen saturation. EXAM: PORTABLE CHEST 1 VIEW COMPARISON:  05/20/2017 FINDINGS: Cardiomediastinal silhouette is normal. Mediastinal contours appear intact. Tortuosity and calcific atherosclerotic disease of the aorta. Curvilinear densities overlying the right hemithorax may be secondary to skin folds however anterior pneumothorax in a supine patient cannot be excluded. Interval development of interstitial pulmonary edema. There is no evidence of focal airspace consolidation, pleural effusion or pneumothorax. Osseous structures are without acute abnormality. Soft tissues are grossly normal. IMPRESSION: Interval development of interstitial pulmonary edema. Curvilinear densities overlying the right hemithorax may be secondary to skin folds however anterior pneumothorax in a supine patient cannot be excluded. Confirmation with a decubital radiograph, left side down, is recommended. Electronically Signed   By: Fidela Salisbury M.D.   On: 05/31/2017 18:03    ED COURSE and MDM  Nursing notes and initial vitals signs, including pulse oximetry, reviewed.  Vitals:   05/31/17 1803 05/31/17 1809 05/31/17 1830 05/31/17 1840  BP:  104/76  123/68  Pulse: 89 89  90  Resp: 14 14  15   Temp:  98.5 F (36.9 C)   TempSrc:   Rectal   SpO2: 99% 99%  95%  Weight:      Height:       5:20 PM Patient's vital signs had improved after initial IV fluid bolus started by nursing staff.  Sepsis protocol orders placed.  7:17 PM Zosyn and vancomycin ordered for possible bacterial sepsis.  There is no  obvious source.  7:25 PM Dr. Maudie Mercury to admit.  PROCEDURES   CRITICAL CARE Performed by: Shanon Rosser L Total critical care time: 30 minutes Critical care time was exclusive of separately billable procedures and treating other patients. Critical care was necessary to treat or prevent imminent or life-threatening deterioration. Critical care was time spent personally by me on the following activities: development of treatment plan with patient and/or surrogate as well as nursing, discussions with consultants, evaluation of patient's response to treatment, examination of patient, obtaining history from patient or surrogate, ordering and performing treatments and interventions, ordering and review of laboratory studies, ordering and review of radiographic studies, pulse oximetry and re-evaluation of patient's condition.   ED DIAGNOSES     ICD-10-CM   1. SIRS (systemic inflammatory response syndrome) (HCC) R65.10   2. Hypokalemia E87.6        Rayon Mcchristian, Jenny Reichmann, MD 05/31/17 [phone]

## 2017-05-31 NOTE — Progress Notes (Signed)
Pharmacy Antibiotic Note  Rachel [name] is a 67 y.o. female admitted on 05/31/2017 with sepsis.  Pharmacy has been consulted for Vancomycin and Zosyn dosing.  Plan:  Vancomycin 1000mg  IV q24h Check trough at steady state Zosyn 3.375gm IV q8h, EID Monitor labs, renal fxn, progress and c/s Deescalate ABX when improved / appropriate.     Height: 5\' 9"  (175.3 cm) Weight: 108 lb (49 kg) IBW/kg (Calculated) : 66.2  Temp (24hrs), Avg:98.1 F (36.7 C), Min:97.9 F (36.6 C), Max:98.5 F (36.9 C)  Recent Labs  Lab 05/31/17 1710 05/31/17 1727  WBC 24.8*  --   CREATININE 0.75  --   LATICACIDVEN  --  2.36*    Estimated Creatinine Clearance: 55 mL/min (by C-G formula based on SCr of 0.75 mg/dL).    Allergies  Allergen Reactions  . Sulfa Antibiotics Anaphylaxis  . Ibuprofen Other (See Comments)    unknown  . Shellfish Allergy Swelling    Mouth    Antimicrobials this admission: vancomycin 4/22 >>  zosyn 4/22 >>   Dose adjustments this admission:   Microbiology results:  BCx: pending  UCx: pending   Sputum:    MRSA PCR:   Thank you for allowing pharmacy to be a part of this patient's care.  Hart Robinsons A 05/31/2017 10:35 PM

## 2017-05-31 NOTE — H&P (Signed)
[facility] H&P   Patient Demographics:    Rachel [name], is a 67 y.o. female  MRN: [id number]   DOB - 05/03/1950  Admit Date - 05/31/2017  Outpatient Primary MD for the patient is Iona Beard, MD  Referring MD/NP/PA: [place]  Outpatient Specialists:   Patient coming from: home  Chief Complaint  Patient presents with  . Weakness      HPI:    Rachel [name]  is a 67 y.o. 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 Pt is s/p craniectomy with microdissection on 04/23/2017=> meningioma.  Apparently per her family she has been less responsive for the past 2 days, and eating less.  Pt was brought to ED for evaluation of less responsiveness and generalized weakness.   In ED,  CXR IMPRESSION: Interval development of interstitial pulmonary edema.  Curvilinear densities overlying the right hemithorax may be secondary to skin folds however anterior pneumothorax in a supine patient cannot be excluded. Confirmation with a decubital radiograph, left side down, is recommended.  Wbc 24.8, Hgb 13.8, Plt 301 Na 138, K 3.2, Bun 15, Creatinine 0.75 Calcium 11.4, Alb 3.1 Ast 18, Alt 19  Urinalysis wbc 6-30 rbc 0-5 Lactic acid 2.36  Pt will be admitted for fever, tachycardia, leukocytosis, sepsis likely due to UTI, and also hypercalcemia, and hypokalemia     Review of systems:    In addition to the HPI above,   No Headache, No changes with Vision or hearing, No problems swallowing food or Liquids, No Chest pain,  Cough or Shortness of Breath, No Abdominal pain, No Nausea or Vommitting, Bowel movements are regular, No Blood in stool or Urine, No dysuria, No new skin rashes or bruises, No new joints pains-aches,  No new weakness, tingling, numbness in any extremity, No recent weight gain or loss, No polyuria, polydypsia or polyphagia, No significant Mental Stressors.  A full 10 point Review of Systems was done, except as stated above, all other Review of Systems were negative.   With Past History of the following :    Past Medical History:  Diagnosis Date  . Blindness   . Diabetes mellitus type I ([place])   . GERD (gastroesophageal reflux disease)   . Hyperlipidemia   . Paroxysmal atrial fibrillation (HCC)   . Schizophrenia ([place])   . Sleep apnea   . Subarachnoid hemorrhage ([place])   . Systemic hypertension   . Vertigo       Past Surgical History:  Procedure Laterality Date  . BRAIN TUMOR EXCISION    . CRANIOTOMY Left 04/23/2017  Procedure: CRANIOTOMY TUMOR EXCISION;  Surgeon: Earnie Larsson, MD;  Location: [facility];  Service: Neurosurgery;  Laterality: Left;  left  . NM MYOCAR PERF WALL MOTION  02/01/2007   no significant ischemia  . US ECHOCARDIOGRAPHY  12/20/2003   mild mitral annular ca+,mild MR,TR,PI,AOV mildly sclerotic      Social History:     Social History   Tobacco Use  . Smoking status: Never Smoker  . Smokeless tobacco: Never Used  Substance Use Topics  . Alcohol use: No     Lives - at home Mobility -unable to walk   Family History :     Family History  Problem Relation Age of Onset  . ADD / ADHD Neg Hx   . Alcohol abuse Neg Hx   . Drug abuse Neg Hx   . Anxiety disorder Neg Hx   . Bipolar disorder Neg Hx   . Dementia Neg Hx   . Depression Neg Hx   . OCD Neg Hx   . Paranoid behavior Neg Hx   . Schizophrenia Neg Hx   . Seizures Neg Hx   . Sexual abuse Neg Hx   . Physical abuse Neg Hx       Home Medications:   Prior to Admission medications     Medication Sig Start Date End Date Taking? Authorizing Provider  acetaminophen (TYLENOL) 500 MG tablet Take 1,000 mg by mouth every 6 (six) hours as needed for mild pain.   Yes [provider]  albuterol (PROVENTIL) (2.5 MG/3ML) 0.083% nebulizer solution Take 3 mLs (2.5 mg total) by nebulization every 4 (four) hours as needed for wheezing or shortness of breath. 05/21/17  Yes Sheikh, Omair Latif, DO  ALPRAZolam Duanne Moron) 0.25 MG tablet TAKE 1 TABLET BY MOUTH THREE TIMES DAILY AS NEEDED FOR ANXIETY 03/29/17  Yes Cloria Spring, MD  bisacodyl (DULCOLAX) 5 MG EC tablet Take 5 mg by mouth daily.   Yes [provider]  diltiazem (CARDIZEM CD) 180 MG 24 hr capsule Take 1 capsule (180 mg total) by mouth daily. 12/29/16  Yes Croitoru, Mihai, MD  famotidine (PEPCID) 20 MG tablet Take 20 mg by mouth daily.  01/08/11  Yes [provider]  insulin NPH Human (HUMULIN N,NOVOLIN N) 100 UNIT/ML injection Inject 0.18 mLs (18 Units total) into the skin 2 (two) times daily at 8 am and 10 pm. 09/26/15  Yes Rosalin Hawking, MD  levETIRAcetam (KEPPRA) 500 MG tablet Take 1 tablet (500 mg total) by mouth 2 (two) times daily. 05/21/17  Yes Sheikh, Omair Latif, DO  loratadine (CLARITIN) 10 MG tablet Take 10 mg by mouth daily as needed for allergies.  09/11/15  Yes [provider]  Multiple Vitamin (MULTIVITAMIN WITH MINERALS) TABS tablet Take 1 tablet by mouth daily.   Yes [provider]  ondansetron (ZOFRAN) 4 MG tablet Take 1 tablet (4 mg total) by mouth every 6 (six) hours as needed for nausea. 05/21/17  Yes Sheikh, Omair Latif, DO  pantoprazole (PROTONIX) 40 MG tablet Take 1 tablet (40 mg total) by mouth daily. 05/21/17 05/21/18 Yes Sheikh, Omair Latif, DO  PARoxetine (PAXIL) 40 MG tablet Take 1 tablet (40 mg total) at bedtime by mouth. 12/22/16  Yes Cloria Spring, MD  risperiDONE (RISPERDAL) 2 MG tablet Take 1 tablet (2 mg total) by mouth at bedtime. 10/22/16  Yes Cloria Spring, MD   rosuvastatin (CRESTOR) 10 MG tablet Take 1 tablet (10 mg total) by mouth every evening. 12/29/16  Yes Croitoru, Mihai,  MD  temazepam (RESTORIL) 15 MG capsule Take 1 capsule (15 mg total) at bedtime as needed by mouth for sleep. 12/22/16  Yes Cloria Spring, MD  traZODone (DESYREL) 50 MG tablet Take 1 tablet (50 mg total) by mouth at bedtime. 07/22/16  Yes Cloria Spring, MD  polyethylene glycol [facility] / Floria Raveling) packet Place 17 g into feeding tube daily. Patient not taking: Reported on 05/31/2017 05/22/17   Kerney Elbe, DO     Allergies:     Allergies  Allergen Reactions  . Sulfa Antibiotics Anaphylaxis  . Ibuprofen Other (See Comments)    unknown  . Shellfish Allergy Swelling    Mouth     Physical Exam:   Vitals  Blood pressure 123/68, pulse 90, temperature 98.5 F (36.9 C), temperature source Rectal, resp. rate 15, height 5\' 9"  (1.753 m), weight 49 kg (108 lb), SpO2 95 %.   1. General  lying in bed in NAD,   2. Normal affect and insight, Not Suicidal or Homicidal, Awake Alert, Oriented X 1.  3. No F.N deficits, ALL C.Nerves Intact, Strength 5/5 all 4 extremities, Sensation intact all 4 extremities, Plantars down going.  4. Ears and Eyes appear Normal, Conjunctivae clear, PERRLA. Moist Oral Mucosa.  5. Supple Neck, No JVD, No cervical lymphadenopathy appriciated, No Carotid Bruits.  6. Symmetrical Chest wall movement, Good air movement bilaterally, CTAB.  7. RRR, No Gallops, Rubs or Murmurs, No Parasternal Heave.  8. Positive Bowel Sounds, Abdomen Soft, No tenderness, No organomegaly appriciated,No rebound -guarding or rigidity.  9.  No Cyanosis, Normal Skin Turgor, No Skin Rash or Bruise.  10. Good muscle tone,  joints appear normal , no effusions, Normal ROM.  11. No Palpable Lymph Nodes in Neck or Axillae     Data Review:    CBC Recent Labs  Lab 05/31/17 1710  WBC 24.8*  HGB 13.8  HCT 42.5  PLT 301  MCV 95.5  MCH 31.0  MCHC 32.5  RDW  14.2  LYMPHSABS 2.0  MONOABS 1.8*  EOSABS 0.0  BASOSABS 0.0   ------------------------------------------------------------------------------------------------------------------  Chemistries  Recent Labs  Lab 05/31/17 1710  NA 138  K 3.2*  CL 101  CO2 24  GLUCOSE 151*  BUN 15  CREATININE 0.75  CALCIUM 11.4*  AST 18  ALT 19  ALKPHOS 82  BILITOT 1.3*   ------------------------------------------------------------------------------------------------------------------ estimated creatinine clearance is 55 mL/min (by C-G formula based on SCr of 0.75 mg/dL). ------------------------------------------------------------------------------------------------------------------ No results for input(s): TSH, T4TOTAL, T3FREE, THYROIDAB in the last 72 hours.  Invalid input(s): FREET3  Coagulation profile No results for input(s): INR, PROTIME in the last 168 hours. ------------------------------------------------------------------------------------------------------------------- No results for input(s): DDIMER in the last 72 hours. -------------------------------------------------------------------------------------------------------------------  Cardiac Enzymes No results for input(s): CKMB, TROPONINI, MYOGLOBIN in the last 168 hours.  Invalid input(s): CK ------------------------------------------------------------------------------------------------------------------ No results found for: BNP   ---------------------------------------------------------------------------------------------------------------  Urinalysis    Component Value Date/Time   COLORURINE AMBER (A) 05/31/2017 1710   APPEARANCEUR CLOUDY (A) 05/31/2017 1710   LABSPEC 1.019 05/31/2017 1710   PHURINE 5.0 05/31/2017 1710   GLUCOSEU NEGATIVE 05/31/2017 1710   HGBUR SMALL (A) 05/31/2017 1710   BILIRUBINUR NEGATIVE 05/31/2017 1710   KETONESUR 20 (A) 05/31/2017 1710   PROTEINUR 100 (A) 05/31/2017 1710    UROBILINOGEN 0.2 12/04/2013 1518   NITRITE NEGATIVE 05/31/2017 1710   LEUKOCYTESUR TRACE (A) 05/31/2017 1710    ----------------------------------------------------------------------------------------------------------------   Imaging Results:    Dg Chest Port 1 View  Result Date: 05/31/2017  CLINICAL DATA:  Rhonchi.  Low oxygen saturation. EXAM: PORTABLE CHEST 1 VIEW COMPARISON:  05/20/2017 FINDINGS: Cardiomediastinal silhouette is normal. Mediastinal contours appear intact. Tortuosity and calcific atherosclerotic disease of the aorta. Curvilinear densities overlying the right hemithorax may be secondary to skin folds however anterior pneumothorax in a supine patient cannot be excluded. Interval development of interstitial pulmonary edema. There is no evidence of focal airspace consolidation, pleural effusion or pneumothorax. Osseous structures are without acute abnormality. Soft tissues are grossly normal. IMPRESSION: Interval development of interstitial pulmonary edema. Curvilinear densities overlying the right hemithorax may be secondary to skin folds however anterior pneumothorax in a supine patient cannot be excluded. Confirmation with a decubital radiograph, left side down, is recommended. Electronically Signed   By: Fidela Salisbury M.D.   On: 05/31/2017 18:03       Assessment & Plan:    Principal Problem:   Sepsis ([place]) Active Problems:   Diabetes mellitus type 2 in nonobese (HCC)   Hypokalemia   Hypercalcemia   UTI (urinary tract infection)   Tachycardia    Sepsis Blood culture x2 Urine culture pending vanco iv , zosyn iv pharmacy to dose  UTI Urine culture pending abx as above  Hypokalemia Replete Check cmp in am  Hypercalcemia Check pth, pth rp, tsh, vitamin D, myeloma panel, mag, phos Hydrate with ns iv  Tachycardia and hypotension Tele Trop I q6h x3 Cortisol D dimer, if positive then CTA chest Cardiac echo  ? Pneumothorax CT chest  Seizure  do Cont keppra  Dm2 fsbs q4h , ISS  Pafib Cont aspirin Cont Cardizem  Gerd Cont PPI  Hyperlipidemia Cont Crestor    DVT Prophylaxis  - SCDs  AM Labs Ordered, also please review Full Orders  Family Communication: Admission, patients condition and plan of care including tests being ordered have been discussed with the patient who indicate understanding and agree with the plan and Code Status.  Code Status DNR  Likely DC to  home  Condition GUARDED    Consults called: none  Admission status: inpatient   Time spent in minutes : 45   Jani Gravel M.D on 05/31/2017 at 7:55 PM  Between 7am to 7pm - Pager - [phone]   After 7pm go to [email or web address] - password [facility]  [facility] - Office  [phone]

## 2017-05-31 NOTE — ED Triage Notes (Signed)
Pt's husband reports pt is very lethargic. Husband reports pt hasn't ate good in about 2 days. Pt's O2 sat 89% on RA and HR 133 in triage.

## 2017-06-01 ENCOUNTER — Inpatient Hospital Stay (HOSPITAL_COMMUNITY): Payer: BLUE CROSS/BLUE SHIELD

## 2017-06-01 DIAGNOSIS — N390 Urinary tract infection, site not specified: Secondary | ICD-10-CM

## 2017-06-01 DIAGNOSIS — I6990 Unspecified sequelae of unspecified cerebrovascular disease: Secondary | ICD-10-CM

## 2017-06-01 DIAGNOSIS — I509 Heart failure, unspecified: Secondary | ICD-10-CM

## 2017-06-01 LAB — COMPREHENSIVE METABOLIC PANEL
ALK PHOS: 67 U/L (ref 38–126)
ALT: 16 U/L (ref 14–54)
ANION GAP: 10 (ref 5–15)
AST: 15 U/L (ref 15–41)
Albumin: 2.5 g/dL — ABNORMAL LOW (ref 3.5–5.0)
BUN: 15 mg/dL (ref 6–20)
CALCIUM: 10.6 mg/dL — AB (ref 8.9–10.3)
CO2: 24 mmol/L (ref 22–32)
CREATININE: 0.64 mg/dL (ref 0.44–1.00)
Chloride: 105 mmol/L (ref 101–111)
GFR calc non Af Amer: >60 mL/min (ref >60)
Glucose, Bld: 264 mg/dL — ABNORMAL HIGH (ref 65–99)
Potassium: 3.2 mmol/L — ABNORMAL LOW (ref 3.5–5.1)
Sodium: 139 mmol/L (ref 135–145)
Total Bilirubin: 1 mg/dL (ref 0.3–1.2)
Total Protein: 5.5 g/dL — ABNORMAL LOW (ref 6.5–8.1)

## 2017-06-01 LAB — CBC
HEMATOCRIT: 38.7 % (ref 36.0–46.0)
Hemoglobin: 12.7 g/dL (ref 12.0–15.0)
MCH: 31.2 pg (ref 26.0–34.0)
MCHC: 32.8 g/dL (ref 30.0–36.0)
MCV: 95.1 fL (ref 78.0–100.0)
Platelets: 282 10*3/uL (ref 150–400)
RBC: 4.07 MIL/uL (ref 3.87–5.11)
RDW: 14.1 % (ref 11.5–15.5)
WBC: 21.7 10*3/uL — ABNORMAL HIGH (ref 4.0–10.5)

## 2017-06-01 LAB — ECHOCARDIOGRAM COMPLETE
Height: 69 in
WEIGHTICAEL: 1679.02 [oz_av]

## 2017-06-01 LAB — CORTISOL: CORTISOL PLASMA: 27.6 ug/dL

## 2017-06-01 LAB — TROPONIN I
TROPONIN I: 0.09 ng/mL — AB (ref <0.03)
Troponin I: 0.09 ng/mL (ref <0.03)

## 2017-06-01 MED ORDER — POTASSIUM CHLORIDE IN NACL 20-0.9 MEQ/L-% IV SOLN
INTRAVENOUS | Status: AC
  Administered 2017-06-01: 23:00:00 via INTRAVENOUS

## 2017-06-01 MED ORDER — GLUCERNA SHAKE PO LIQD
237.0000 mL | Freq: Three times a day (TID) | ORAL | Status: DC
  Administered 2017-06-02: 21:00:00 via ORAL
  Administered 2017-06-02 – 2017-06-03 (×2): 237 mL via ORAL

## 2017-06-01 MED ORDER — ADULT MULTIVITAMIN W/MINERALS CH
1.0000 | ORAL_TABLET | Freq: Every day | ORAL | Status: DC
  Administered 2017-06-02 – 2017-06-04 (×3): 1 via ORAL
  Filled 2017-06-01 (×3): qty 1

## 2017-06-01 MED ORDER — ENSURE ENLIVE PO LIQD: 237.0000 mL | Freq: Two times a day (BID) | ORAL | Status: DC

## 2017-06-01 NOTE — Progress Notes (Signed)
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  GOAL:   Patient will meet greater than or equal to 90% of their needs MONITOR:   PO intake, Supplement acceptance, Weight trends, Skin, Labs REASON FOR ASSESSMENT:   Malnutrition Screening Tool    ASSESSMENT: Patient presents to [facility] with complaint of weakness.  Patient dependent for all ADL's and she is blind. Husband is here today for lunch and dinner to feed patient. At breakfast staff attempted to feed  but pt refused to eat per discussion with her nurse. At lunch when RD arrived tray had been 50-75% consumed. The husband had already gone but when RD returned he is here to say since she was discharged he has been giving her 3 meals daily. Patient has been eating soft foods. He says she did not respond well to the pureed diet she was on during last hospitalization.  Overall her weight is down 33% over the past year which is clinically severe. She weighed 156.9 lb (71 kg) May 8th, 2018. The patients weight was 108 lb at discharge < 2 weeks ago.  Recent discharge from [facility] on 4/12 following a month long hospitalization. She presented to [facility] with left dural masses and cerebral edema.  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 Patient remained on vent following surgery patient mental status poor-slow to wake up following surgery.   Nutrition consulted for initiation and management of tube feeding. Tube placed by [facility] team on 3/16. Patient met criteria for malnutrition and diagnosed with severe malnutrition in  chronic illness (brain tumor).   Progressive aphasia and right sided weakness present. She was extubated on 4/3 palliative care following for Burns.   Family asked for tube feeding to be resumed and on 4/3-RD initated Osmolite 1.2 @ 50 ml/hr via Cortrak. Which provided: 1440 kcal, 67 grams protein, and 973 ml free water. Patient was evaluated  by ST on 4/7 bedside- findings cognitive based-oropharyngeal dysphagia. Severe aspiration risk and recommended NPO. On 4/8 MBS performed. Patient was able to protect her airway and diet was advanced to Pureed /Nectar-thick liquids, oral care BID.   On 4/12  Per case manager - the husband decided against PEG tube/Coretrak placement. Pt was made a DNR and she was discharged home with husband on 4/12.  Labs: BMP Latest Ref Rng & Units 06/01/2017 05/31/2017 05/21/2017  Glucose 65 - 99 mg/dL 264(H) 151(H) 214(H)  BUN 6 - 20 mg/dL 15 15 18   Creatinine 0.44 - 1.00 mg/dL 0.64 0.75 0.41(L)  Sodium 135 - 145 mmol/L 139 138 141  Potassium 3.5 - 5.1 mmol/L 3.2(L) 3.2(L) 4.2  Chloride 101 - 111 mmol/L 105 101 108  CO2 22 - 32 mmol/L 24 24 26   Calcium 8.9 - 10.3 mg/dL 10.6(H) 11.4(H) 10.0     NUTRITION - FOCUSED PHYSICAL EXAM:    Most Recent Value  Orbital Region  Severe depletion  Upper Arm Region  Severe depletion  Thoracic and Lumbar Region  Moderate depletion  Buccal Region  Severe depletion  Temple Region  Moderate depletion  Clavicle Bone Region  Severe depletion  Clavicle and  Acromion Bone Region  Severe depletion  Scapular Bone Region  Severe depletion  Dorsal Hand  Severe depletion  Patellar Region  Severe depletion  Anterior Thigh Region  Severe depletion  Posterior Calf Region  Moderate depletion  Edema (RD Assessment)  None  Hair  Reviewed  Eyes  Unable to assess  Mouth  Unable to assess  Skin  Reviewed  Nails  Reviewed     Diet Order:  Diet heart healthy/carb modified Room service appropriate? Yes; Fluid consistency: Thin  EDUCATION  NEEDS:     Skin:  Skin Assessment: Skin Integrity Issues:(pt had multiple pressure injuries documented during last hospital stay. Current status unclear at this time)  Last BM:   unknown   Height:   Ht Readings from Last 1 Encounters:  05/31/17 5' 9"  (1.753 m)    Weight:   Wt Readings from Last 1 Encounters:  06/01/17 104 lb 15 oz (47.6 kg)    Ideal Body Weight:  66 kg  BMI:  Body mass index is 15.5 kg/m.  Estimated Nutritional Needs:   Kcal:  2992-4268  Protein:  67-77 gr   Fluid:  >1400 ml daily   Colman Cater MS,RD,CSG,LDN Office: [phone] Pager: [phone]

## 2017-06-01 NOTE — Progress Notes (Signed)
Inpatient Diabetes Program Recommendations  AACE/ADA: New Consensus Statement on Inpatient Glycemic Control (2015)  Target Ranges:  Prepandial:   less than 140 mg/dL      Peak postprandial:   less than 180 mg/dL (1-2 hours)      Critically ill patients:  140 - 180 mg/dL   Lab Results  Component Value Date   GLUCAP 178 (H) 05/21/2017   HGBA1C 7.4 (H) 04/20/2017    Review of Glycemic Control  Diabetes history: DM2 Outpatient Diabetes medications: NPH 18 units bid Current orders for Inpatient glycemic control: None  Inpatient Diabetes Program Recommendations:    -Lantus 15 units qd while in the hospital -Glycemic control order set qid -Custom Novolog correction scale 0-5 units       151-200  1 unit      201-250  2 units      251-300  3 units      301-350  4 units      351-400  5 units  Thank you, Bethena Roys E. Medard Decuir, RN, MSN, CDE  Diabetes Coordinator Inpatient Glycemic Control Team Team Pager [phone] (8am-5pm) 06/01/2017 9:21 AM

## 2017-06-01 NOTE — Progress Notes (Signed)
Held IV medications because patient has no IV access. PICC has been ordered by the doctor. Will give when the PICC is placed.

## 2017-06-01 NOTE — Progress Notes (Signed)
[facility]  Patient Status: Active (receiving services up to time of hospitalization)  [facility] is providing the following services: RN, PT, OT and ST  If patient discharges after hours, please call [phone].   [place] 06/01/2017, 1:52 PM

## 2017-06-01 NOTE — Progress Notes (Signed)
[facility] PROGRESS NOTE  Rachel [name] QIO:[id number] DOB: 1950/07/14 DOA: 05/31/2017 PCP: Iona Beard, MD  Interim summary and HPI 67 y.o. 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 Pt is s/p craniectomy with microdissection on 04/23/2017=> meningioma.  Apparently per her family she has been less responsive for the past 2 days, and eating less.  Pt was brought to ED for evaluation of less responsiveness and generalized weakness.   Assessment/Plan: 1-Sepsis: most likely from UTI -continue IVF's -follow cultures results -continue current IV antibiotics -follow clinical response  -given guarded prognosis and poor quality of life will ask palliative care to review GOC and advance directives. Family in agreement and not interested in heroic measures.  2-toxic encephalopathy  -follow mentation and neurologic effects  -continue ABX's  3-hypokalemia and hypercalcemia -continue IVF's -replete electrolytes as needed  -check Mg level and follow PTH, PTH RP, TSH, Vit D, MM panel and phosphorus (as previously ordered), results pending   4-tachycardia and hypotension -improved with IVF's resuscitation  -D-dimer was elevated -unable to doe CTA given lack of access -PICC line ordered -currently denying CP and SOB -2-d echo ordered   5-seizure disorder  -continue keppra -no tongue biting -no seizure appreciated -if encephalopathy persist, check EEG to r/o absence seizure.  6-PAF -continue ASA and Cardizem -monitoring on telemetry   7-GERD -continue PPI  8-HLD -continue statins.   Code Status:  DNR Family Communication: husband at bedside  Disposition Plan: PICC line requested, family not looking for heroic measures or intervention. Continue IVF's and IV antibiotics. Guarded prognosis   Consultants:  Palliative care  Procedures:  See below for x-ray reports   Antibiotics:  Vancomycin and zosyn 4/22  HPI/Subjective: Afebrile currently, in no acute distress. Still somnolent and oriented X1 only. No CP, no SOB. Patient lost IV and had very poor access.   Objective: Vitals:   06/01/17 0506 06/01/17 1407  BP: 124/86 135/69  Pulse: 73 67  Resp:  16  Temp: 98.2 F (36.8 C) 98.3 F (36.8 C)  SpO2: 100% 100%    Intake/Output Summary (Last 24 hours) at 06/01/2017 2102 Last data filed at 06/01/2017 1920 Gross per 24 hour  Intake 430 ml  Output -  Net 430 ml   Filed Weights   05/31/17 1645 05/31/17 2204 06/01/17 0506  Weight: 49 kg (108 lb) 47.5 kg (104 lb 11.5 oz) 47.6 kg (104 lb 15 oz)    Exam:   General: oriented X1, somnolent. No fever, no CP, no nausea or vomiting. Patient is frail and underweight.  Cardiovascular: mild tachycardia, no murmurs, no rubs, no gallops.  Respiratory: CTA bilaterally, no using accessory muscles.  Abdomen: soft, NT, ND, positive BS  Musculoskeletal: no edema, no cyanosis   Skin: patient big well healed scar in her head from recent brain tumor excision.  Data Reviewed: Basic Metabolic Panel: Recent Labs  Lab 05/31/17 1710 05/31/17 1838 06/01/17 0459  NA 138  --  139  K 3.2*  --  3.2*  CL 101  --  105  CO2 24  --  24  GLUCOSE 151*  --  264*  BUN 15  --  15  CREATININE  0.75  --  0.64  CALCIUM 11.4*  --  10.6*  MG  --  2.4  --   PHOS  --  3.4  --    Liver Function Tests: Recent Labs  Lab 05/31/17 1710 06/01/17 0459  AST 18 15  ALT 19 16  ALKPHOS 82 67  BILITOT 1.3* 1.0  PROT 6.4* 5.5*  ALBUMIN 3.1* 2.5*   CBC: Recent Labs  Lab 05/31/17 1710 05/31/17 2329  WBC 24.8* 21.7*  NEUTROABS 21.1*  --   HGB  13.8 12.7  HCT 42.5 38.7  MCV 95.5 95.1  PLT 301 282   Cardiac Enzymes: Recent Labs  Lab 05/31/17 2329 06/01/17 0459  TROPONINI 0.09* 0.09*   BNP (last 3 results) Recent Labs    05/31/17 1838  BNP 63.0    Recent Results (from the past 240 hour(s))  Blood culture (routine x 2)     Status: None (Preliminary result)   Collection Time: 05/31/17  5:21 PM  Result Value Ref Range Status   Specimen Description RIGHT ANTECUBITAL DRAWN BY RN  Final   Special Requests   Final    BOTTLES DRAWN AEROBIC AND ANAEROBIC Blood Culture adequate volume   Culture   Final    NO GROWTH < 24 HOURS Performed at [facility], [address], [place], [place] [address]    Report Status PENDING  Incomplete  Blood culture (routine x 2)     Status: None (Preliminary result)   Collection Time: 05/31/17  5:30 PM  Result Value Ref Range Status   Specimen Description LEFT ANTECUBITAL  Final   Special Requests   Final    BOTTLES DRAWN AEROBIC AND ANAEROBIC Blood Culture adequate volume   Culture   Final    NO GROWTH < 24 HOURS Performed at [facility], [address] [place], [place] [address]    Report Status PENDING  Incomplete     Studies: Ct Head Wo Contrast  Result Date: 06/01/2017 CLINICAL DATA:  History of Rosai-Dorfman disease with multiple prior cranial surgeries for extradural tumor resection. Altered mental status. EXAM: CT HEAD WITHOUT CONTRAST TECHNIQUE: Contiguous axial images were obtained from the base of the skull through the vertex without intravenous contrast. COMPARISON:  Brain MRI 04/28/2017 and head CT 04/26/2017 FINDINGS: Brain: No mass lesion, intraparenchymal hemorrhage or extra-axial collection. No evidence of acute cortical infarct. There is encephalomalacia of the left anterior temporal lobe. The degree of edema seen at this location on the prior study has resolved. There is smooth density along the dura of the left convexity, underlying the craniotomy site. No hydrocephalus.  Generalized atrophy pattern is unchanged. Vascular: No hyperdense vessel or unexpected vascular calcification. Skull: Remote suboccipital craniectomy and left parietotemporal craniotomy. Sinuses/Orbits: No sinus fluid levels or advanced mucosal thickening. No mastoid effusion. Normal orbits. IMPRESSION: Postoperative changes of relatively recent resection of left middle cranial fossa extra-axial tumor with resolution of previously seen extra-axial hemorrhage, pneumocephalus and parenchymal edema. No acute intracranial abnormality. Electronically Signed   By: Ulyses Jarred M.D.   On: 06/01/2017 03:13   Dg Chest Port 1 View  Result Date: 05/31/2017 CLINICAL DATA:  Rhonchi.  Low oxygen saturation. EXAM: PORTABLE CHEST 1 VIEW COMPARISON:  05/20/2017 FINDINGS: Cardiomediastinal silhouette is normal. Mediastinal contours appear intact. Tortuosity and calcific atherosclerotic disease of the aorta. Curvilinear densities overlying the right hemithorax may be secondary to skin folds however anterior pneumothorax in a supine patient cannot be excluded. Interval development of interstitial pulmonary edema. There is no  evidence of focal airspace consolidation, pleural effusion or pneumothorax. Osseous structures are without acute abnormality. Soft tissues are grossly normal. IMPRESSION: Interval development of interstitial pulmonary edema. Curvilinear densities overlying the right hemithorax may be secondary to skin folds however anterior pneumothorax in a supine patient cannot be excluded. Confirmation with a decubital radiograph, left side down, is recommended. Electronically Signed   By: Fidela Salisbury M.D.   On: 05/31/2017 18:03    Scheduled Meds: . aspirin EC  81 mg Oral Daily  . bisacodyl  5 mg Oral Daily  . diltiazem  180 mg Oral Daily  . famotidine  20 mg Oral Daily  . feeding supplement (GLUCERNA SHAKE)  237 mL Oral TID BM  . levETIRAcetam  500 mg Oral BID  . multivitamin with minerals  1 tablet Oral  Daily  . pantoprazole  40 mg Oral Daily  . PARoxetine  40 mg Oral QHS  . risperiDONE  2 mg Oral QHS  . rosuvastatin  10 mg Oral QPM   Continuous Infusions: . 0.9 % NaCl with KCl 20 mEq / L    . piperacillin-tazobactam (ZOSYN)  IV Stopped (06/01/17 1020)  . vancomycin       Time spent: 35 minutes    [facility] Pager [phone]. If 7PM-7AM, please contact night-coverage at [email or web address], password [facility] 06/01/2017, 9:02 PM  LOS: 1 day

## 2017-06-01 NOTE — Progress Notes (Signed)
Attempted to give patient scheduled PO meds was unable to due to patient not being awake enough to take meds. Paged Mid level and got order to hold all PO meds tonight. Patient also has no IV access and is scheduled to get a PICC line placed.

## 2017-06-01 NOTE — Progress Notes (Signed)
*  PRELIMINARY RESULTS* Echocardiogram 2D Echocardiogram has been performed.  Samuel Germany 06/01/2017, 2:06 PM

## 2017-06-01 NOTE — ED Notes (Signed)
  Lactic Acid: 2.36.  MD notified

## 2017-06-01 NOTE — Plan of Care (Signed)
Not progressing 

## 2017-06-02 ENCOUNTER — Inpatient Hospital Stay: Payer: Self-pay

## 2017-06-02 ENCOUNTER — Inpatient Hospital Stay (HOSPITAL_COMMUNITY): Payer: BLUE CROSS/BLUE SHIELD

## 2017-06-02 ENCOUNTER — Encounter (HOSPITAL_COMMUNITY): Payer: Self-pay | Admitting: Primary Care

## 2017-06-02 DIAGNOSIS — Z515 Encounter for palliative care: Secondary | ICD-10-CM

## 2017-06-02 DIAGNOSIS — G9341 Metabolic encephalopathy: Secondary | ICD-10-CM

## 2017-06-02 DIAGNOSIS — A419 Sepsis, unspecified organism: Principal | ICD-10-CM

## 2017-06-02 DIAGNOSIS — Z7189 Other specified counseling: Secondary | ICD-10-CM

## 2017-06-02 DIAGNOSIS — R6510 Systemic inflammatory response syndrome (SIRS) of non-infectious origin without acute organ dysfunction: Secondary | ICD-10-CM

## 2017-06-02 DIAGNOSIS — I6990 Unspecified sequelae of unspecified cerebrovascular disease: Secondary | ICD-10-CM

## 2017-06-02 DIAGNOSIS — Z452 Encounter for adjustment and management of vascular access device: Secondary | ICD-10-CM

## 2017-06-02 DIAGNOSIS — R000 Tachycardia, unspecified: Secondary | ICD-10-CM

## 2017-06-02 DIAGNOSIS — D763 Other histiocytosis syndromes: Secondary | ICD-10-CM

## 2017-06-02 DIAGNOSIS — J690 Pneumonitis due to inhalation of food and vomit: Secondary | ICD-10-CM

## 2017-06-02 LAB — COMPREHENSIVE METABOLIC PANEL
ALK PHOS: 69 U/L (ref 38–126)
ALT: 15 U/L (ref 14–54)
ANION GAP: 10 (ref 5–15)
AST: 15 U/L (ref 15–41)
Albumin: 2.5 g/dL — ABNORMAL LOW (ref 3.5–5.0)
BUN: 10 mg/dL (ref 6–20)
CO2: 27 mmol/L (ref 22–32)
Calcium: 10.7 mg/dL — ABNORMAL HIGH (ref 8.9–10.3)
Chloride: 105 mmol/L (ref 101–111)
Creatinine, Ser: 0.52 mg/dL (ref 0.44–1.00)
GFR calc non Af Amer: >60 mL/min (ref >60)
GLUCOSE: 261 mg/dL — AB (ref 65–99)
Potassium: 3 mmol/L — ABNORMAL LOW (ref 3.5–5.1)
Sodium: 142 mmol/L (ref 135–145)
Total Bilirubin: 0.7 mg/dL (ref 0.3–1.2)
Total Protein: 5.5 g/dL — ABNORMAL LOW (ref 6.5–8.1)

## 2017-06-02 LAB — GLUCOSE, CAPILLARY
GLUCOSE-CAPILLARY: 295 mg/dL — AB (ref 65–99)
Glucose-Capillary: 240 mg/dL — ABNORMAL HIGH (ref 65–99)

## 2017-06-02 LAB — PARATHYROID HORMONE, INTACT (NO CA): PTH: 35 pg/mL (ref 15–65)

## 2017-06-02 LAB — MRSA PCR SCREENING: MRSA BY PCR: NEGATIVE

## 2017-06-02 LAB — VITAMIN D 25 HYDROXY (VIT D DEFICIENCY, FRACTURES): VIT D 25 HYDROXY: 32.5 ng/mL (ref 30.0–100.0)

## 2017-06-02 LAB — CALCITRIOL (1,25 DI-OH VIT D): VIT D 1 25 DIHYDROXY: 54 pg/mL (ref 19.9–79.3)

## 2017-06-02 LAB — CREATININE, SERUM
CREATININE: 0.53 mg/dL (ref 0.44–1.00)
GFR calc Af Amer: >60 mL/min (ref >60)
GFR calc non Af Amer: >60 mL/min (ref >60)

## 2017-06-02 MED ORDER — SODIUM CHLORIDE 0.9% FLUSH
10.0000 mL | Freq: Two times a day (BID) | INTRAVENOUS | Status: DC
  Administered 2017-06-02: 10 mL
  Administered 2017-06-03: 20 mL

## 2017-06-02 MED ORDER — SODIUM CHLORIDE 0.9% FLUSH: 10.0000 mL | INTRAVENOUS | Status: DC | PRN

## 2017-06-02 MED ORDER — INSULIN ASPART 100 UNIT/ML ~~LOC~~ SOLN
0.0000 [IU] | Freq: Every day | SUBCUTANEOUS | Status: DC
  Administered 2017-06-02: 2 [IU] via SUBCUTANEOUS

## 2017-06-02 MED ORDER — SODIUM CHLORIDE 0.9 % IV SOLN
60.0000 mg | Freq: Once | INTRAVENOUS | Status: AC
  Administered 2017-06-02: 60 mg via INTRAVENOUS
  Filled 2017-06-02: qty 20

## 2017-06-02 MED ORDER — INSULIN NPH (HUMAN) (ISOPHANE) 100 UNIT/ML ~~LOC~~ SUSP
5.0000 [IU] | Freq: Two times a day (BID) | SUBCUTANEOUS | Status: DC
  Administered 2017-06-02 – 2017-06-03 (×2): 5 [IU] via SUBCUTANEOUS
  Filled 2017-06-02: qty 10

## 2017-06-02 MED ORDER — POTASSIUM CHLORIDE 20 MEQ/15ML (10%) PO SOLN
20.0000 meq | Freq: Once | ORAL | Status: AC
  Administered 2017-06-02: 20 meq via ORAL
  Filled 2017-06-02: qty 30

## 2017-06-02 MED ORDER — POTASSIUM CHLORIDE IN NACL 40-0.9 MEQ/L-% IV SOLN
INTRAVENOUS | Status: DC
  Administered 2017-06-02 – 2017-06-03 (×2): 75 mL/h via INTRAVENOUS

## 2017-06-02 MED ORDER — INSULIN ASPART 100 UNIT/ML ~~LOC~~ SOLN
0.0000 [IU] | Freq: Three times a day (TID) | SUBCUTANEOUS | Status: DC
  Administered 2017-06-02: 5 [IU] via SUBCUTANEOUS
  Administered 2017-06-03 (×2): 3 [IU] via SUBCUTANEOUS
  Administered 2017-06-04: 1 [IU] via SUBCUTANEOUS

## 2017-06-02 NOTE — Progress Notes (Signed)
Peripherally Inserted Central Catheter/Midline Placement  The IV Nurse has discussed with the patient and/or persons authorized to consent for the patient, the purpose of this procedure and the potential benefits and risks involved with this procedure.  The benefits include less needle sticks, lab draws from the catheter, and the patient may be discharged home with the catheter. Risks include, but not limited to, infection, bleeding, blood clot (thrombus formation), and puncture of an artery; nerve damage and irregular heartbeat and possibility to perform a PICC exchange if needed/ordered by physician.  Alternatives to this procedure were also discussed.  Bard Power PICC patient education guide, fact sheet on infection prevention and patient information card has been provided to patient /or left at bedside.  Consent signed by husband due to altered mental status.  PICC/Midline Placement Documentation  PICC Single Lumen 17/79/39 PICC Right Basilic 42 cm 0 cm (Active)  Indication for Insertion or Continuance of Line Prolonged intravenous therapies;Poor Vasculature-patient has had multiple peripheral attempts or PIVs lasting less than 24 hours 06/02/2017  1:45 PM  Exposed Catheter (cm) 0 cm 06/02/2017  1:45 PM  Site Assessment Clean;Dry;Intact 06/02/2017  1:45 PM  Line Status Flushed;Saline locked;Blood return noted 06/02/2017  1:45 PM  Dressing Type Transparent 06/02/2017  1:45 PM  Dressing Status Clean;Dry;Intact;Antimicrobial disc in place 06/02/2017  1:45 PM  Dressing Change Due 06/09/17 06/02/2017  1:45 PM       Cadyn Fann, Nicolette Bang 06/02/2017, 1:46 PM

## 2017-06-02 NOTE — Progress Notes (Addendum)
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  At baseline, the patient requires maximal assistance with all her activities of daily living including feeding.  At baseline, the patient is not able to carry on a conversation, and she speaks incoherently.  The patient was recently discharged from the hospital after a prolonged stay from 04/19/2017 through 05/21/2017.  During that hospital stay, the patient was initially admitted secondary to concerns with seizure.  She had a craniotomy with resection of extra-axial masses on 04/23/2017.  Her hospital stay was prolonged secondary to somnolence respiratory failure, and failure to thrive.  Ultimately, the patient underwent LTAC evaluation, but was refused.  The patient subsequently went home with total care under her husband.  Prior to this admission, there were no complaints of vomiting, diarrhea, uncontrolled pain, respiratory distress.  There were no new changes in her medications since her discharge on 05/21/2017.   Assessment/Plan: Acute metabolic encephalopathy -Multifactorial including UTI, hypercalcemia, and hypnotic medications -Patient is improving, but remains somnolent -At baseline, the patient is essentially bedbound and requires maximal assistance with all her IADLs -During her last hospitalization, the patient was noted to have prolonged somnolence -423 CT brain--postoperative changes, no acute changes; resolution of extra-axial hemorrhage and edema  Hypercalcemia -Likely secondary to her Roasi-Dorfman's syndrome in setting of immobility -Restart IV fluids -Redose  pamidronate -check PTH-rp  Aspiration pneumonitis -Personally reviewed 4/22 chest x-ray--coarse interstitial changes -I feel pt's clinical presentation with CXR findings represents aspiration -check MRSA PCR -continue zosyn  UTI -Urine culture with >100K GNR -continue zosyn pending culture data  Dysphagia --Speech evaluation on 4/7and MBS on 4/8:Dysphagia 1 diet with close supervision -Speech therapy evaluation -Continue oral care  Paroxysmal atrial fibrillation -Rate controlled -Continue diltiazem CD -Continue aspirin -Not a good candidate for anticoagulation secondary intracranial tumors  Diabetes mellitus type 2 -Start NovoLog sliding scale -Start half home dose NPH -04/20/2017 hemoglobin A1c--7.4  Schizophrenia/Anxiety/Depression -Home Trazodone 50 mg per Tube, Risperidone 2 mg per Tube qHS and Paroxetine 40   Hypokalemia -Replete -Check magnesium  Severe malnutrition -continue supplements   Disposition Plan:   Home in 2-3 days  Family Communication:   Spouse updated at bedside  Consultants:  palliative  Code Status:  DNR  DVT Prophylaxis:  SCDs   Procedures: As Listed in Progress Note Above  Antibiotics: None    Subjective: Patient is awake, but does not follow commands.  She requires full assistance with meals and activities of daily living.  There is no reports of vomiting, diarrhea, respiratory distress, uncontrolled pain.  Review of systems otherwise unobtainable secondary to her mental status.  Objective: Vitals:   06/01/17 1407 06/01/17 2026 06/01/17 2134 06/02/17 0328  BP: 135/69  134/76 137/83  Pulse: 67  72 72  Resp: 16  15 16   Temp: 98.3 F (36.8 C)  98.2 F (36.8 C) 98.6 F (37 C)  TempSrc: Oral  Oral Oral  SpO2: 100% 99% 100% 100%  Weight:    48.7 kg (107 lb 5.8 oz)  Height:        Intake/Output Summary (Last 24 hours) at 06/02/2017 1334 Last data filed at 06/02/2017 0300 Gross per 24 hour  Intake 485.83 ml  Output -   Net 485.83 ml   Weight change: -0.289 kg (-10.2 oz) Exam:   General:  Pt is alert, follows commands appropriately, not in acute distress  HEENT: No icterus, No thrush, No neck mass, [place]/AT  Cardiovascular: RRR, S1/S2, no rubs, no gallops  Respiratory: Scattered bilateral rales.  No wheezing.  Abdomen: Soft/+BS, non tender, non distended, no guarding  Extremities: No edema, No lymphangitis, No petechiae, No rashes, no synovitis   Data Reviewed: I have personally reviewed following labs and imaging studies Basic Metabolic Panel: Recent Labs  Lab 05/31/17 1710 05/31/17 1838 06/01/17 0459 06/02/17 0432  NA 138  --  139 142  K 3.2*  --  3.2* 3.0*  CL 101  --  105 105  CO2 24  --  24 27  GLUCOSE 151*  --  264* 261*  BUN 15  --  15 10  CREATININE 0.75  --  0.64 0.52  0.53  CALCIUM 11.4*  --  10.6* 10.7*  MG  --  2.4  --   --   PHOS  --  3.4  --   --    Liver Function Tests: Recent Labs  Lab 05/31/17 1710 06/01/17 0459 06/02/17 0432  AST 18 15 15   ALT 19 16 15   ALKPHOS 82 67 69  BILITOT 1.3* 1.0 0.7  PROT 6.4* 5.5* 5.5*  ALBUMIN 3.1* 2.5* 2.5*   No results for input(s): LIPASE, AMYLASE in the last 168 hours. No results for input(s): AMMONIA in the last 168 hours. Coagulation Profile: No results for input(s): INR, PROTIME in the last 168 hours. CBC: Recent Labs  Lab 05/31/17 1710 05/31/17 2329  WBC 24.8* 21.7*  NEUTROABS 21.1*  --   HGB 13.8 12.7  HCT 42.5 38.7  MCV 95.5 95.1  PLT 301 282   Cardiac Enzymes: Recent Labs  Lab 05/31/17 2329 06/01/17 0459  TROPONINI 0.09* 0.09*   BNP: Invalid input(s): POCBNP CBG: No results for input(s): GLUCAP in the last 168 hours. HbA1C: No results for input(s): HGBA1C in the last 72 hours. Urine analysis:    Component Value Date/Time   COLORURINE AMBER (A) 05/31/2017 1710   APPEARANCEUR CLOUDY (A) 05/31/2017 1710   LABSPEC 1.019 05/31/2017 1710   PHURINE 5.0 05/31/2017 1710   GLUCOSEU NEGATIVE  05/31/2017 1710   HGBUR SMALL (A) 05/31/2017 1710   BILIRUBINUR NEGATIVE 05/31/2017 1710   KETONESUR 20 (A) 05/31/2017 1710   PROTEINUR 100 (A) 05/31/2017 1710   UROBILINOGEN 0.2 12/04/2013 1518   NITRITE NEGATIVE 05/31/2017 1710   LEUKOCYTESUR TRACE (A) 05/31/2017 1710   Sepsis Labs: @LABRCNTIP (procalcitonin:4,lacticidven:4) ) Recent Results (from the past 240 hour(s))  Blood culture (routine x 2)     Status: None (Preliminary result)   Collection Time: 05/31/17  5:21 PM  Result Value Ref Range Status   Specimen Description RIGHT ANTECUBITAL DRAWN BY RN  Final   Special Requests   Final    BOTTLES DRAWN AEROBIC AND ANAEROBIC Blood Culture adequate volume   Culture   Final    NO GROWTH 2 DAYS Performed at [facility], [address], [place], [place] [address]    Report Status PENDING  Incomplete  Urine culture     Status: Abnormal (Preliminary result)   Collection Time: 05/31/17  5:25 PM  Result Value Ref Range Status   Specimen Description   Final    URINE, RANDOM Performed at [facility], [address], [place], [place] [address]  Special Requests   Final    NONE Performed at [facility], [address] [place], [place] [address]    Culture >=100,000 COLONIES/mL GRAM NEGATIVE RODS (A)  Final   Report Status PENDING  Incomplete  Blood culture (routine x 2)     Status: None (Preliminary result)   Collection Time: 05/31/17  5:30 PM  Result Value Ref Range Status   Specimen Description LEFT ANTECUBITAL  Final   Special Requests   Final    BOTTLES DRAWN AEROBIC AND ANAEROBIC Blood Culture adequate volume   Culture   Final    NO GROWTH 2 DAYS Performed at [facility], [address], [place], [place] [address]    Report Status PENDING  Incomplete     Scheduled Meds: . aspirin EC  81 mg Oral Daily  . bisacodyl  5 mg Oral Daily  . diltiazem  180 mg Oral Daily  . famotidine  20 mg Oral Daily  . feeding supplement (GLUCERNA SHAKE)  237 mL Oral TID BM  .  levETIRAcetam  500 mg Oral BID  . multivitamin with minerals  1 tablet Oral Daily  . pantoprazole  40 mg Oral Daily  . PARoxetine  40 mg Oral QHS  . risperiDONE  2 mg Oral QHS  . rosuvastatin  10 mg Oral QPM   Continuous Infusions: . piperacillin-tazobactam (ZOSYN)  IV 3.375 g (06/02/17 1149)  . vancomycin      Procedures/Studies: Ct Head Wo Contrast  Result Date: 06/01/2017 CLINICAL DATA:  History of Rosai-Dorfman disease with multiple prior cranial surgeries for extradural tumor resection. Altered mental status. EXAM: CT HEAD WITHOUT CONTRAST TECHNIQUE: Contiguous axial images were obtained from the base of the skull through the vertex without intravenous contrast. COMPARISON:  Brain MRI 04/28/2017 and head CT 04/26/2017 FINDINGS: Brain: No mass lesion, intraparenchymal hemorrhage or extra-axial collection. No evidence of acute cortical infarct. There is encephalomalacia of the left anterior temporal lobe. The degree of edema seen at this location on the prior study has resolved. There is smooth density along the dura of the left convexity, underlying the craniotomy site. No hydrocephalus. Generalized atrophy pattern is unchanged. Vascular: No hyperdense vessel or unexpected vascular calcification. Skull: Remote suboccipital craniectomy and left parietotemporal craniotomy. Sinuses/Orbits: No sinus fluid levels or advanced mucosal thickening. No mastoid effusion. Normal orbits. IMPRESSION: Postoperative changes of relatively recent resection of left middle cranial fossa extra-axial tumor with resolution of previously seen extra-axial hemorrhage, pneumocephalus and parenchymal edema. No acute intracranial abnormality. Electronically Signed   By: Ulyses Jarred M.D.   On: 06/01/2017 03:13   Dg Chest Port 1 View  Result Date: 05/31/2017 CLINICAL DATA:  Rhonchi.  Low oxygen saturation. EXAM: PORTABLE CHEST 1 VIEW COMPARISON:  05/20/2017 FINDINGS: Cardiomediastinal silhouette is normal. Mediastinal  contours appear intact. Tortuosity and calcific atherosclerotic disease of the aorta. Curvilinear densities overlying the right hemithorax may be secondary to skin folds however anterior pneumothorax in a supine patient cannot be excluded. Interval development of interstitial pulmonary edema. There is no evidence of focal airspace consolidation, pleural effusion or pneumothorax. Osseous structures are without acute abnormality. Soft tissues are grossly normal. IMPRESSION: Interval development of interstitial pulmonary edema. Curvilinear densities overlying the right hemithorax may be secondary to skin folds however anterior pneumothorax in a supine patient cannot be excluded. Confirmation with a decubital radiograph, left side down, is recommended. Electronically Signed   By: Fidela Salisbury M.D.   On: 05/31/2017 18:03   Dg Chest [facility] 1 View  Result  Date: 05/20/2017 CLINICAL DATA:  Shortness of breath. EXAM: PORTABLE CHEST 1 VIEW COMPARISON:  05/11/2017 FINDINGS: Cardiac silhouette is normal in size.  No mediastinal or masses. Lung volumes are low. Prominent bronchovascular markings bases accentuated by the low lung volumes. No lung consolidation to suggest pneumonia. No pulmonary edema. No pleural effusion or pneumothorax. Enteric feeding tube passes below the diaphragm and below the included field of view. IMPRESSION: 1. No acute cardiopulmonary disease. 2. Status post removal of the endotracheal tube since the prior exam. Electronically Signed   By: Lajean Manes M.D.   On: 05/20/2017 10:05   Dg Chest Port 1 View  Result Date: 05/11/2017 CLINICAL DATA:  Respiratory failure EXAM: PORTABLE CHEST 1 VIEW COMPARISON:  05/10/2017. FINDINGS: Support tubes and lines are stable. ET tube 3.6 cm above carina. Normal heart size. Thoracic atherosclerosis. No consolidation or edema. IMPRESSION: Stable chest. Electronically Signed   By: Staci Righter M.D.   On: 05/11/2017 07:44   Dg Chest Port 1 View  Result  Date: 05/10/2017 CLINICAL DATA:  Hypoxia EXAM: PORTABLE CHEST 1 VIEW COMPARISON:  May 08, 2017 FINDINGS: Endotracheal tube tip is 2.3 cm above the carina. Feeding tube tip is below the diaphragm. No pneumothorax. There is no edema or consolidation. The heart size and pulmonary vascularity are normal. No adenopathy. There is aortic atherosclerosis. IMPRESSION: Positions as described without pneumothorax. No edema or consolidation. There is aortic atherosclerosis. Aortic Atherosclerosis (ICD10-I70.0). Electronically Signed   By: Lowella Grip III M.D.   On: 05/10/2017 08:18   Dg Chest Port 1 View  Result Date: 05/08/2017 CLINICAL DATA:  Respiratory failure EXAM: PORTABLE CHEST 1 VIEW COMPARISON:  Three days ago FINDINGS: New retrocardiac opacity with volume loss. Normal heart size and mediastinal contours. Endotracheal tube tip is between the clavicular heads and carina. Feeding tube at least reaches the stomach. The left lung is clear. Artifact from EKG leads. IMPRESSION: Interval left lower lobe collapse. Electronically Signed   By: Monte Fantasia M.D.   On: 05/08/2017 08:14   Dg Chest Port 1 View  Result Date: 05/05/2017 CLINICAL DATA:  Failure to wing from mechanical ventilation. EXAM: PORTABLE CHEST 1 VIEW COMPARISON:  Portable chest x-ray of May 02, 2017 FINDINGS: The lungs are adequately inflated and clear. There is no pneumothorax or pleural effusion. The heart and pulmonary vascularity are normal. There is calcification in the wall of the aortic arch. The feeding tube tip projects below the inferior margin of the image. The endotracheal tube tip lies 3 cm above the carina. IMPRESSION: No evidence of pneumonia nor CHF. Thoracic aortic atherosclerosis. The support tubes are in reasonable position. Electronically Signed   By: Khalaya Mcgurn  [place] M.D.   On: 05/05/2017 09:43   Dg Swallowing Func-speech Pathology  Result Date: 05/17/2017 Objective Swallowing Evaluation: Type of Study: MBS-Modified  Barium Swallow Study  Patient Details Name: CECILIE HEIDEL MRN: [id number] Date of Birth: 07/10/1950 Today's Date: 05/17/2017 Time: SLP Start Time (ACUTE ONLY): 1340 -SLP Stop Time (ACUTE ONLY): 1405 SLP Time Calculation (min) (ACUTE ONLY): 25 min Past Medical History: Past Medical History: Diagnosis Date . Blindness  . Diabetes mellitus type I ([place])  . GERD (gastroesophageal reflux disease)  . Hyperlipidemia  . Paroxysmal atrial fibrillation ([facility])  . Schizophrenia ([place])  . Sleep apnea  . Subarachnoid hemorrhage ([place])  . Systemic hypertension  . Vertigo  Past Surgical History: Past Surgical History: Procedure Laterality Date . BRAIN TUMOR EXCISION   . CRANIOTOMY Left 04/23/2017  Procedure: CRANIOTOMY TUMOR  EXCISION;  Surgeon: Earnie Larsson, MD;  Location: [place];  Service: Neurosurgery;  Laterality: Left;  left . NM MYOCAR PERF WALL MOTION  02/01/2007  no significant ischemia . US ECHOCARDIOGRAPHY  12/20/2003  mild mitral annular ca+,mild MR,TR,PI,AOV mildly sclerotic HPI: 67 year old female with history of Rosai-Dorfman's disease as well as diabetes mellitus and schizophrenia presents with worsening right-sided weakness and aphasia.  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  Patient admitted for treatment with IV steroids and possible later surgical resection.  No history of seizure.  Patient has a history of blindness.  She is status post a remote occipital craniectomy and resection of extradural lesion years ago. MRI shows LEFT dural thickening with 2 dural-based masses again noted. Anterior temporal extra-axial solidly enhancing mass was 16 x 24 mm, now 15 x 21 mm. The more posterior mass was 0.7 x 1.2 cm, now 2.5 x 3.3 cm corresponding to CT abnormality. Mass effect on LEFT temporal lobes with severe T2 bright presume vasogenic edema LEFT temporal frontal and parietal lobes including posterior limb of  the internal capsule. Regional mass effect with 3 mm new LEFT-to-RIGHT midline shift. LEFT uncal herniation. Partial effacement LEFT lateral ventricle without RIGHT ventricle entrapment or, hydrocephalus. Hemosiderin staining mesial LEFT parietal lobe at site of prior hemorrhage. Numerous scattered chronic micro hemorrhages within supra-and infratentorial brain. Moderate to severe parenchymal brain volume loss. No abnormal extra-axial fluid collections. Pt had BSE earlier this admission on 3/13 with primarily cognitively-based dysphagia with limited oral acceptance of POs. With cueing and precautions, it was recommended that she started with sips of water only. Pt was then intubated 3/15 for L craniotomy. Extubation was attempted after the procedure but she required reintubation. Pt then remained intubated until one-way extubation 4/3.  Subjective: pt is alert and keeps her eyes open but makes no attempts to speak Assessment / Plan / Recommendation CHL IP CLINICAL IMPRESSIONS 05/17/2017 Clinical Impression Patient presents with a moderate oropharyngeal dysphagia, exacerbated by acute changes in mentation. Patient initially asleep, requiring max verbal and tactile cueing to arouse enough for po intake, including completion of oral care. Once aroused however, and despite maintaining eyes closed throughout exam, patient willingly opens mouth for intake of bolus with gentle tactile cue to bottom lip. Oral transit then largely normal with both pureed solids and liquids with only intermittently delayed oral transit. Expectoration of soft solid bolus noted. Patient able to protect airway with pureed solids and nectar thick liquids via tsp, penetrating deeply and with question of aspiration (difficult to view given poor posture) of nectar thick and thin liquid via straw. Only subtle, non-productive throat clear in response.  Unfortunately, patient unable to take cup sips due to combination of poor head control and decreased  awareness. Additionally, mild residuals remain post swallow. Although mentation significantly increases risk of aspiration, if family wishes, would consider initiation of pureed solids and nectar thick liquids via spoon when alert. SLP will f/u.  SLP Visit Diagnosis Dysphagia, oropharyngeal phase (R13.12) Attention and concentration deficit following -- Frontal lobe and executive function deficit following -- Impact on safety and function Moderate aspiration risk;Severe aspiration risk   CHL IP TREATMENT RECOMMENDATION 05/17/2017 Treatment Recommendations Therapy as outlined in treatment plan below   Prognosis 05/17/2017 Prognosis for Safe Diet Advancement Fair Barriers to Reach Goals Cognitive deficits;Other (Comment) Barriers/Prognosis Comment -- CHL IP DIET RECOMMENDATION 05/17/2017 SLP Diet Recommendations Dysphagia 1 (Puree)  solids;Nectar thick liquid Liquid Administration via Spoon Medication Administration Crushed with puree Compensations Slow rate;Small sips/bites Postural Changes Seated upright at 90 degrees   CHL IP OTHER RECOMMENDATIONS 05/17/2017 Recommended Consults -- Oral Care Recommendations Oral care BID Other Recommendations Order thickener from pharmacy;Prohibited food (jello, ice cream, thin soups);Remove water pitcher   CHL IP FOLLOW UP RECOMMENDATIONS 05/17/2017 Follow up Recommendations (No Data)   CHL IP FREQUENCY AND DURATION 05/17/2017 Speech Therapy Frequency (ACUTE ONLY) min 3x week Treatment Duration 2 weeks      CHL IP ORAL PHASE 05/17/2017 Oral Phase WFL Oral - Pudding Teaspoon -- Oral - Pudding Cup -- Oral - Honey Teaspoon -- Oral - Honey Cup -- Oral - Nectar Teaspoon -- Oral - Nectar Cup -- Oral - Nectar Straw -- Oral - Thin Teaspoon -- Oral - Thin Cup -- Oral - Thin Straw -- Oral - Puree -- Oral - Mech Soft -- Oral - Regular -- Oral - Multi-Consistency -- Oral - Pill -- Oral Phase - Comment --  CHL IP PHARYNGEAL PHASE 05/17/2017 Pharyngeal Phase Impaired Pharyngeal- Pudding Teaspoon -- Pharyngeal  -- Pharyngeal- Pudding Cup -- Pharyngeal -- Pharyngeal- Honey Teaspoon Delayed swallow initiation-vallecula;Reduced tongue base retraction;Pharyngeal residue - valleculae Pharyngeal -- Pharyngeal- Honey Cup -- Pharyngeal -- Pharyngeal- Nectar Teaspoon Delayed swallow initiation-vallecula;Reduced tongue base retraction;Pharyngeal residue - valleculae Pharyngeal -- Pharyngeal- Nectar Cup -- Pharyngeal -- Pharyngeal- Nectar Straw Delayed swallow initiation-vallecula;Reduced tongue base retraction;Pharyngeal residue - valleculae;Penetration/Aspiration during swallow Pharyngeal Material enters airway, remains ABOVE vocal cords and not ejected out Pharyngeal- Thin Teaspoon Delayed swallow initiation-pyriform sinuses;Reduced tongue base retraction;Pharyngeal residue - valleculae Pharyngeal -- Pharyngeal- Thin Cup -- Pharyngeal -- Pharyngeal- Thin Straw Delayed swallow initiation-vallecula;Reduced tongue base retraction;Pharyngeal residue - valleculae;Penetration/Aspiration during swallow Pharyngeal Material enters airway, CONTACTS cords and not ejected out Pharyngeal- Puree Delayed swallow initiation-vallecula;Reduced tongue base retraction;Pharyngeal residue - valleculae Pharyngeal -- Pharyngeal- Mechanical Soft -- Pharyngeal -- Pharyngeal- Regular -- Pharyngeal -- Pharyngeal- Multi-consistency -- Pharyngeal -- Pharyngeal- Pill -- Pharyngeal -- Pharyngeal Comment --  Gabriel Rainwater MA, CCC-SLP [phone] Gabriel Rainwater Meryl 05/17/2017, 2:41 PM              [place] Ekg Site Rite  Result Date: 06/02/2017 If Site Rite image not attached, placement could not be confirmed due to current cardiac rhythm.   Orson Eva, DO  [facility] Pager [phone]  If 7PM-7AM, please contact night-coverage [email or web address] Password [email or web address] 06/02/2017, 1:34 PM   LOS: 2 days

## 2017-06-02 NOTE — Progress Notes (Signed)
Daily Progress Note   Patient Name: Rachel [name]       Date: 06/02/2017 DOB: June 07, 1950  Age: 67 y.o. MRN#: [id number] Attending Physician: Orson Eva, MD Primary Care Physician: Iona Beard, MD Admit Date: 05/31/2017  Reason for Consultation/Follow-up: Establishing goals of care and Psychosocial/spiritual support  Subjective: Rachel [name] is lying quietly in bed.  She does not open her eyes or respond when I call her name or touch her leg.  She appears cachectic, very weak and frail.  She appears acutely/chronically ill.  Present today at bedside is her Rachel of 42 years, Rachel [name].  He states that they have grown children, they are aware of the severity of their mother's illness per [facility], but he states he is able to manage her care on his own.  Mr. Carneiro is retired, he shares that Rachel [name] did not work.  We talked about her current illness, sepsis related to UTI, urine cultures, blood cultures.  We talked about IV antibiotics and fluids.  We talked about 24 to 48 hours for outcomes.  I draw a diagram showing recovery trajectories (quicker recovery equals likely more recovery versus slower recovery equals less recovery).  Also share a diagram of the chronic illness pathway, what is normal and is expected as we near end-of-life.  We briefly talked about Rachel [name]'s weight and nutrition.  Mr. Zuch shares that he has seen a quick turnaround after the antibiotics in the ED.  He states prior to arrival she was not eating, now she can eat.  We talked about her nutritional state since leaving the hospital last time.  Mr. [name] states "she is eating good now".  We plan for follow-up meeting tomorrow.  Length of Stay: 2  Current Medications: Scheduled  Meds:  . aspirin EC  81 mg Oral Daily  . bisacodyl  5 mg Oral Daily  . diltiazem  180 mg Oral Daily  . famotidine  20 mg Oral Daily  . feeding supplement (GLUCERNA SHAKE)  237 mL Oral TID BM  . levETIRAcetam  500 mg Oral BID  . multivitamin with minerals  1 tablet Oral Daily  . pantoprazole  40 mg Oral Daily  . PARoxetine  40 mg Oral QHS  . risperiDONE  2 mg Oral QHS  . rosuvastatin  10 mg Oral QPM  Continuous Infusions: . piperacillin-tazobactam (ZOSYN)  IV Stopped (06/02/17 0902)  . vancomycin      PRN Meds: acetaminophen **OR** acetaminophen, albuterol, ALPRAZolam, loratadine, temazepam  Physical Exam  Constitutional: No distress.  Does not open eyes to voice or touch, appears acutely/chronically ill, frail  Cardiovascular: Normal rate.  Pulmonary/Chest: Effort normal. No respiratory distress.  Abdominal: Soft. She exhibits no distension.  Musculoskeletal: She exhibits no edema.  Very frail, cachectic  Neurological:  Does not open eyes to voice or touch at this time  Skin: Skin is warm and dry.  Psychiatric:  Calm, not fearful  Nursing note and vitals reviewed.           Vital Signs: BP 137/83 (BP Location: Left Arm)   Pulse 72   Temp 98.6 F (37 C) (Oral)   Resp 16   Ht 5\' 9"  (1.753 m)   Wt 48.7 kg (107 lb 5.8 oz)   SpO2 100%   BMI 15.85 kg/m  SpO2: SpO2: 100 % O2 Device: O2 Device: Nasal Cannula O2 Flow Rate: O2 Flow Rate (L/min): 2 L/min  Intake/output summary:   Intake/Output Summary (Last 24 hours) at 06/02/2017 1107 Last data filed at 06/02/2017 0300 Gross per 24 hour  Intake 485.83 ml  Output -  Net 485.83 ml   LBM: Last BM Date: 05/30/17 Baseline Weight: Weight: 49 kg (108 lb) Most recent weight: Weight: 48.7 kg (107 lb 5.8 oz)       Palliative Assessment/Data:    Flowsheet Rows     Most Recent Value  Intake Tab  Referral Department  Hospitalist  Unit at Time of Referral  Cardiac/Telemetry Unit  Palliative Care Primary Diagnosis   Sepsis/Infectious Disease  Date Notified  06/01/17  Palliative Care Type  Return patient Palliative Care  Reason for referral  Clarify Goals of Care  Date of Admission  05/31/17  Date first seen by Palliative Care  06/02/17  # of days Palliative referral response time  1 Day(s)  # of days IP prior to Palliative referral  1  Clinical Assessment  Palliative Performance Scale Score  20%  Pain Max last 24 hours  Not able to report  Pain Min Last 24 hours  Not able to report  Dyspnea Max Last 24 Hours  Not able to report  Dyspnea Min Last 24 hours  Not able to report  Psychosocial & Spiritual Assessment  Palliative Care Outcomes  Patient/Family meeting held?  Yes  Who was at the meeting?  Rachel [name] at bedside  [place] goals of care, Provided psychosocial or spiritual support  Patient/Family wishes: Interventions discontinued/not started   Mechanical Ventilation      Patient Active Problem List   Diagnosis Date Noted  . Sepsis ([place]) 05/31/2017  . UTI (urinary tract infection) 05/31/2017  . Tachycardia 05/31/2017  . Dysphagia 05/17/2017  . Palliative care by specialist   . Failure to wean from mechanical ventilation ([place])   . Goals of care, counseling/discussion   . Palliative care encounter   . Pressure injury of skin 04/23/2017  . Protein-calorie malnutrition, severe 04/23/2017  . Hypercalcemia 04/20/2017  . Hypokalemia 04/19/2017  . Brain edema ([place]) 04/19/2017  . Neoplasm of brain causing mass effect on adjacent structures ([place]) 04/19/2017  . Fatigue 06/16/2016  . Somnolence, daytime 06/16/2016  . 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 Blind   . Schizophrenia ([place])   . Diabetes mellitus type 2 in nonobese ([facility])   . Acute encephalopathy   .  Brain mass   . Subarachnoid hemorrhage following injury, no loss of consciousness ([place]) 09/18/2015  . SAH (subarachnoid hemorrhage) ([place]) 09/17/2015  . Facial droop 12/04/2013  . 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  Blind in both eyes 05/31/2013  . HTN (hypertension) 10/11/2012  . PAF (paroxysmal atrial fibrillation) ([place]) 10/11/2012  . Dyslipidemia 10/11/2012  . H/O brain tumor 10/11/2012  . Insomnia due to mental disorder 12/29/2011  . Libido, decreased 12/29/2011  . Depression 04/14/2011    Palliative Care Assessment & Plan   Patient Profile: 67 y.o. female  with past medical history of Rosai-Dorfman's disease, atrial fibrillation, HTN, HLD, DM type 2, schizophrenia, blindness admitted on 05/31/2017 with flaccid right arm and aphasia r/t brain edema s/t increasing size of known brain lesions treated with steroids and surgical resection 04/23/17, ventilated with poor neurological recovery.  She was able to extubate, but continued to have poor by mouth intake.  She was able to return home with her Rachel, home health services with [facility].  She has been less responsive over the last 2 days and eating less, therefore her Rachel brought her to the hospital  Assessment: Sepsis most likely from UTI; urine culture pending, blood cultures pending, continue IV antibiotic and fluid resuscitation.  24 to 48 hours for outcomes.  Recommendations/Plan:  24 to 48 hours for outcomes.  Goals of Care and Additional Recommendations:  Limitations on Scope of Treatment: Continue to treat the treatable but no CPR, no intubation, no surgery.  Code Status:    Code Status Orders  (From admission, onward)        Start     Ordered   05/31/17 2259  Do not attempt resuscitation (DNR)  Continuous    Question Answer Comment  In the event of cardiac or respiratory ARREST Do not call a "code blue"   In the event of cardiac or respiratory ARREST Do not perform Intubation, CPR, defibrillation or ACLS   In the event of cardiac or respiratory ARREST Use medication by any route, position, wound care, and other measures to relive pain and suffering. May use oxygen, suction and manual treatment of airway obstruction as  needed for comfort.      05/31/17 2259    Code Status History    Date Active Date Inactive Code Status Order ID Comments User Context   05/12/2017 0956 05/21/2017 1756 DNR [id number]  Pershing Proud, NP Inpatient   05/11/2017 1011 05/12/2017 0956 DNR [id number]  Pershing Proud, NP Inpatient   04/19/2017 2239 05/11/2017 1011 Full Code [id number]  Toy Baker, MD ED   09/17/2015 2213 09/19/2015 1516 Full Code [id number]  Wallie Char Inpatient   08/15/2015 1546 08/18/2015 2319 Full Code [id number]  Julianne Rice, MD ED   12/04/2013 1844 12/05/2013 1730 Full Code [id number]  Doree Albee, MD ED    Advance Directive Documentation     Most Recent Value  Type of Advance Directive  Healthcare Power of Attorney  Pre-existing out of facility DNR order (yellow form or pink MOST form)  -  "MOST" Form in Place?  -       Prognosis:   < 3 months, or less would not be surprising based on functional status, frailty, BMI 15.8, sepsis due to UTI.  Discharge Planning:  To be determined, based on outcomes.  Care plan was discussed with nursing staff and Dr. Carles Collet.  Thank you for allowing the Palliative Medicine Team to assist in the care of this patient.   Time In:  0 925 Time Out:  1000 Total Time  35 minutes Prolonged Time Billed  no       Greater than 50%  of this time was spent counseling and coordinating care related to the above assessment and plan.  Rachel Novel, NP  Please contact Palliative Medicine Team phone at [phone] for questions and concerns.

## 2017-06-03 DIAGNOSIS — G9341 Metabolic encephalopathy: Secondary | ICD-10-CM

## 2017-06-03 LAB — MULTIPLE MYELOMA PANEL, SERUM
Albumin SerPl Elph-Mcnc: 2.7 g/dL — ABNORMAL LOW (ref 2.9–4.4)
Albumin/Glob SerPl: 1.1 (ref 0.7–1.7)
Alpha 1: 0.3 g/dL (ref 0.0–0.4)
Alpha2 Glob SerPl Elph-Mcnc: 0.6 g/dL (ref 0.4–1.0)
B-Globulin SerPl Elph-Mcnc: 0.8 g/dL (ref 0.7–1.3)
Gamma Glob SerPl Elph-Mcnc: 0.9 g/dL (ref 0.4–1.8)
Globulin, Total: 2.5 g/dL (ref 2.2–3.9)
IGM (IMMUNOGLOBULIN M), SRM: 79 mg/dL (ref 26–217)
IgA: 239 mg/dL (ref 87–352)
IgG (Immunoglobin G), Serum: 936 mg/dL (ref 700–1600)
TOTAL PROTEIN ELP: 5.2 g/dL — AB (ref 6.0–8.5)

## 2017-06-03 LAB — COMPREHENSIVE METABOLIC PANEL
ALK PHOS: 64 U/L (ref 38–126)
ALT: 13 U/L — ABNORMAL LOW (ref 14–54)
ANION GAP: 8 (ref 5–15)
AST: 10 U/L — ABNORMAL LOW (ref 15–41)
Albumin: 2.4 g/dL — ABNORMAL LOW (ref 3.5–5.0)
BUN: 7 mg/dL (ref 6–20)
CALCIUM: 10 mg/dL (ref 8.9–10.3)
CO2: 27 mmol/L (ref 22–32)
Chloride: 107 mmol/L (ref 101–111)
Creatinine, Ser: 0.37 mg/dL — ABNORMAL LOW (ref 0.44–1.00)
GFR calc non Af Amer: >60 mL/min (ref >60)
Glucose, Bld: 277 mg/dL — ABNORMAL HIGH (ref 65–99)
Potassium: 3.1 mmol/L — ABNORMAL LOW (ref 3.5–5.1)
SODIUM: 142 mmol/L (ref 135–145)
TOTAL PROTEIN: 5.2 g/dL — AB (ref 6.5–8.1)
Total Bilirubin: 0.7 mg/dL (ref 0.3–1.2)

## 2017-06-03 LAB — GLUCOSE, CAPILLARY
GLUCOSE-CAPILLARY: 211 mg/dL — AB (ref 65–99)
GLUCOSE-CAPILLARY: 223 mg/dL — AB (ref 65–99)
Glucose-Capillary: 99 mg/dL (ref 65–99)
Glucose-Capillary: 158 mg/dL — ABNORMAL HIGH (ref 65–99)

## 2017-06-03 LAB — URINE CULTURE: Culture: >=100000 — AB

## 2017-06-03 LAB — CBC
HCT: 32.6 % — ABNORMAL LOW (ref 36.0–46.0)
HEMOGLOBIN: 10.8 g/dL — AB (ref 12.0–15.0)
MCH: 31.1 pg (ref 26.0–34.0)
MCHC: 33.1 g/dL (ref 30.0–36.0)
MCV: 93.9 fL (ref 78.0–100.0)
Platelets: 268 10*3/uL (ref 150–400)
RBC: 3.47 MIL/uL — AB (ref 3.87–5.11)
RDW: 13.1 % (ref 11.5–15.5)
WBC: 9.1 10*3/uL (ref 4.0–10.5)

## 2017-06-03 LAB — MAGNESIUM: MAGNESIUM: 1.8 mg/dL (ref 1.7–2.4)

## 2017-06-03 MED ORDER — INSULIN NPH (HUMAN) (ISOPHANE) 100 UNIT/ML ~~LOC~~ SUSP
7.0000 [IU] | Freq: Two times a day (BID) | SUBCUTANEOUS | Status: DC
  Administered 2017-06-03 – 2017-06-04 (×2): 7 [IU] via SUBCUTANEOUS
  Filled 2017-06-03 (×2): qty 10

## 2017-06-03 MED ORDER — SODIUM CHLORIDE 0.9 % IV SOLN
3.0000 g | Freq: Three times a day (TID) | INTRAVENOUS | Status: DC
  Administered 2017-06-03 – 2017-06-04 (×3): 3 g via INTRAVENOUS
  Filled 2017-06-03 (×12): qty 3

## 2017-06-03 MED ORDER — POTASSIUM CHLORIDE 20 MEQ/15ML (10%) PO SOLN
40.0000 meq | Freq: Once | ORAL | Status: AC
  Administered 2017-06-03: 40 meq via ORAL
  Filled 2017-06-03: qty 30

## 2017-06-03 NOTE — Progress Notes (Signed)
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  At baseline, the patient requires maximal assistance with all her activities of daily living including feeding.  At baseline, the patient is not able to carry on a conversation, and she speaks incoherently.  The patient was recently discharged from the hospital after a prolonged stay from 04/19/2017 through 05/21/2017.  During that hospital stay, the patient was initially admitted secondary to concerns with seizure.  She had a craniotomy with resection of extra-axial masses on 04/23/2017.  Her hospital stay was prolonged secondary to somnolence respiratory failure, and failure to thrive.  Ultimately, the patient underwent LTAC evaluation, but was refused.  The patient subsequently went home with total care under her husband.  Prior to this admission, there were no complaints of vomiting, diarrhea, uncontrolled pain, respiratory distress.  There were no new changes in her medications since her discharge on 05/21/2017.   Assessment/Plan: Acute metabolic encephalopathy -Multifactorial including UTI, hypercalcemia, and hypnotic medications -Patient is improving, but remains somnolent--spouse states pt is near baseline -At baseline, the patient is essentially bedbound and requires maximal assistance with all her IADLs -During her last hospitalization, the patient was noted to have prolonged episodes of somnolence -423 CT brain--postoperative changes, no acute changes; resolution of extra-axial hemorrhage and edema  Hypercalcemia -Likely secondary to her Roasi-Dorfman's syndrome in setting of  immobility -intact PTH--32 -vitamin D levels normal -Restart IV fluids -Redose pamidronate x 1 4/24 -check PTH-rp -corrected calcium 11.9>>11.2 -am CMP  Aspiration pneumonitis -Personally reviewed 4/22 chest x-ray--coarse interstitial changes -I feel pt's clinical presentation with CXR findings represents aspiration -check MRSA PCR -de-escalate zosyn>>>unasyn 4/25 -06/02/2017 CT chest--asymmetric right perihilar groundglass opacities  UTI--EColi -de-escalate zosyn>>>unasyn 4/25 -WBC improved  Dysphagia --Speech evaluation on 4/7and MBS on 4/8:Dysphagia 1 diet with close supervision -Speech therapy evaluation -Continue oral care  Paroxysmal atrial fibrillation -Rate controlled -Continue diltiazem CD -Continue aspirin -Not a good candidate for anticoagulation secondary intracranial tumors  Diabetes mellitus type 2 -Start NovoLog sliding scale -Increase NPH to 7 units bid -04/20/2017 hemoglobin A1c--7.4  Schizophrenia/Anxiety/Depression -Home Trazodone 50 mg per Tube, Risperidone 2 mg per Tube qHS and Paroxetine 40   Hypokalemia -Replete -Check magnesium--1.8  Severe malnutrition -continue supplements   Disposition Plan:   Home in 1-2 days  Family Communication:   Spouse updated at bedside  Consultants:  palliative  Code Status:  DNR  DVT Prophylaxis:  SCDs   Procedures: As Listed in Progress Note Above  Antibiotics: Zosyn 4/22>>>4/25 vanco 4/22>>>4/25 unasyn 4/25>>>     Subjective: Patient remains somnolent, but opens her eyes.  She awakens to allow feedings.  The patient does not answer any questions.  Review of systems not possible.  Objective: Vitals:   06/02/17 1433 06/02/17 2044 06/03/17 0404 06/03/17 1422  BP: 124/71 (!) 149/78 (!) 162/71 (!) 154/104  Pulse: 61 69 68 68  Resp: 16 18 15 16   Temp: 98.1 F (36.7 C) 98.5 F (36.9 C) 97.9 F (36.6 C) 97.9 F (36.6 C)  TempSrc: Oral Oral Oral Oral  SpO2: 100% 93% 100%  100%  Weight:   49.9 kg (110 lb 0.2 oz)   Height:  Intake/Output Summary (Last 24 hours) at 06/03/2017 1751 Last data filed at 06/03/2017 1200 Gross per 24 hour  Intake 440 ml  Output -  Net 440 ml   Weight change: 1.2 kg (2 lb 10.3 oz) Exam:   General:  Pt is alert, does not follow commands appropriately, not in acute distress  HEENT: No icterus, No thrush, No neck mass, [place]/AT  Cardiovascular: RRR, S1/S2, no rubs, no gallops  Respiratory: Bilateral scattered rales.  No wheezing.  Good air movement.  Abdomen: Soft/+BS, non tender, non distended, no guarding  Extremities: No edema, No lymphangitis, No petechiae, No rashes, no synovitis   Data Reviewed: I have personally reviewed following labs and imaging studies Basic Metabolic Panel: Recent Labs  Lab 05/31/17 1710 05/31/17 1838 06/01/17 0459 06/02/17 0432 06/03/17 0533  NA 138  --  139 142 142  K 3.2*  --  3.2* 3.0* 3.1*  CL 101  --  105 105 107  CO2 24  --  24 27 27   GLUCOSE 151*  --  264* 261* 277*  BUN 15  --  15 10 7   CREATININE 0.75  --  0.64 0.52  0.53 0.37*  CALCIUM 11.4*  --  10.6* 10.7* 10.0  MG  --  2.4  --   --  1.8  PHOS  --  3.4  --   --   --    Liver Function Tests: Recent Labs  Lab 05/31/17 1710 06/01/17 0459 06/02/17 0432 06/03/17 0533  AST 18 15 15  10*  ALT 19 16 15  13*  ALKPHOS 82 67 69 64  BILITOT 1.3* 1.0 0.7 0.7  PROT 6.4* 5.5* 5.5* 5.2*  ALBUMIN 3.1* 2.5* 2.5* 2.4*   No results for input(s): LIPASE, AMYLASE in the last 168 hours. No results for input(s): AMMONIA in the last 168 hours. Coagulation Profile: No results for input(s): INR, PROTIME in the last 168 hours. CBC: Recent Labs  Lab 05/31/17 1710 05/31/17 2329 06/03/17 0533  WBC 24.8* 21.7* 9.1  NEUTROABS 21.1*  --   --   HGB 13.8 12.7 10.8*  HCT 42.5 38.7 32.6*  MCV 95.5 95.1 93.9  PLT 301 282 268   Cardiac Enzymes: Recent Labs  Lab 05/31/17 2329 06/01/17 0459  TROPONINI 0.09* 0.09*    BNP: Invalid input(s): POCBNP CBG: Recent Labs  Lab 06/02/17 1637 06/02/17 2124 06/03/17 0727 06/03/17 1121 06/03/17 1608  GLUCAP 295* 240* 211* 223* 99   HbA1C: No results for input(s): HGBA1C in the last 72 hours. Urine analysis:    Component Value Date/Time   COLORURINE AMBER (A) 05/31/2017 1710   APPEARANCEUR CLOUDY (A) 05/31/2017 1710   LABSPEC 1.019 05/31/2017 1710   PHURINE 5.0 05/31/2017 1710   GLUCOSEU NEGATIVE 05/31/2017 1710   HGBUR SMALL (A) 05/31/2017 1710   BILIRUBINUR NEGATIVE 05/31/2017 1710   KETONESUR 20 (A) 05/31/2017 1710   PROTEINUR 100 (A) 05/31/2017 1710   UROBILINOGEN 0.2 12/04/2013 1518   NITRITE NEGATIVE 05/31/2017 1710   LEUKOCYTESUR TRACE (A) 05/31/2017 1710   Sepsis Labs: @LABRCNTIP (procalcitonin:4,lacticidven:4) ) Recent Results (from the past 240 hour(s))  Blood culture (routine x 2)     Status: None (Preliminary result)   Collection Time: 05/31/17  5:21 PM  Result Value Ref Range Status   Specimen Description RIGHT ANTECUBITAL DRAWN BY RN  Final   Special Requests   Final    BOTTLES DRAWN AEROBIC AND ANAEROBIC Blood Culture adequate volume   Culture   Final    NO GROWTH 3  DAYS Performed at [facility] [address] [place], [place] [address]    Report Status PENDING  Incomplete  Urine culture     Status: Abnormal   Collection Time: 05/31/17  5:25 PM  Result Value Ref Range Status   Specimen Description   Final    URINE, RANDOM Performed at [facility], [address], [place], [place] [address]    Special Requests   Final    NONE Performed at [facility], [address] [place], [place] [address]    Culture >=100,000 COLONIES/mL ESCHERICHIA COLI (A)  Final   Report Status 06/03/2017 FINAL  Final   Organism ID, Bacteria ESCHERICHIA COLI (A)  Final      Susceptibility   Escherichia coli - MIC*    AMPICILLIN 8 SENSITIVE Sensitive     CEFAZOLIN <=4 SENSITIVE Sensitive     CEFTRIAXONE <=1 SENSITIVE Sensitive      CIPROFLOXACIN <=0.25 SENSITIVE Sensitive     GENTAMICIN <=1 SENSITIVE Sensitive     IMIPENEM <=0.25 SENSITIVE Sensitive     NITROFURANTOIN 32 SENSITIVE Sensitive     TRIMETH/SULFA <=20 SENSITIVE Sensitive     AMPICILLIN/SULBACTAM 4 SENSITIVE Sensitive     PIP/TAZO <=4 SENSITIVE Sensitive     Extended ESBL NEGATIVE Sensitive     * >=100,000 COLONIES/mL ESCHERICHIA COLI  Blood culture (routine x 2)     Status: None (Preliminary result)   Collection Time: 05/31/17  5:30 PM  Result Value Ref Range Status   Specimen Description LEFT ANTECUBITAL  Final   Special Requests   Final    BOTTLES DRAWN AEROBIC AND ANAEROBIC Blood Culture adequate volume   Culture   Final    NO GROWTH 3 DAYS Performed at [facility], [address] [place], [place] [address]    Report Status PENDING  Incomplete  MRSA PCR Screening     Status: None   Collection Time: 06/02/17  2:00 PM  Result Value Ref Range Status   MRSA by PCR NEGATIVE NEGATIVE Final    Comment:        The GeneXpert MRSA Assay (FDA approved for NASAL specimens only), is one component of a comprehensive MRSA colonization surveillance program. It is not intended to diagnose MRSA infection nor to guide or monitor treatment for MRSA infections. Performed at [facility], [address] [place], [place] [address]      Scheduled Meds: . aspirin EC  81 mg Oral Daily  . bisacodyl  5 mg Oral Daily  . diltiazem  180 mg Oral Daily  . famotidine  20 mg Oral Daily  . feeding supplement (GLUCERNA SHAKE)  237 mL Oral TID BM  . insulin aspart  0-5 Units Subcutaneous QHS  . insulin aspart  0-9 Units Subcutaneous TID WC  . insulin NPH Human  7 Units Subcutaneous BID AC & HS  . levETIRAcetam  500 mg Oral BID  . multivitamin with minerals  1 tablet Oral Daily  . pantoprazole  40 mg Oral Daily  . PARoxetine  40 mg Oral QHS  . risperiDONE  2 mg Oral QHS  . rosuvastatin  10 mg Oral QPM  . sodium chloride flush  10-40 mL Intracatheter Q12H    Continuous Infusions: . 0.9 % NaCl with KCl 40 mEq / L 75 mL/hr (06/02/17 1514)  . ampicillin-sulbactam (UNASYN) IV      Procedures/Studies: Ct Head Wo Contrast  Result Date: 06/01/2017 CLINICAL DATA:  History of Rosai-Dorfman disease with multiple prior cranial surgeries for extradural  tumor resection. Altered mental status. EXAM: CT HEAD WITHOUT CONTRAST TECHNIQUE: Contiguous axial images were obtained from the base of the skull through the vertex without intravenous contrast. COMPARISON:  Brain MRI 04/28/2017 and head CT 04/26/2017 FINDINGS: Brain: No mass lesion, intraparenchymal hemorrhage or extra-axial collection. No evidence of acute cortical infarct. There is encephalomalacia of the left anterior temporal lobe. The degree of edema seen at this location on the prior study has resolved. There is smooth density along the dura of the left convexity, underlying the craniotomy site. No hydrocephalus. Generalized atrophy pattern is unchanged. Vascular: No hyperdense vessel or unexpected vascular calcification. Skull: Remote suboccipital craniectomy and left parietotemporal craniotomy. Sinuses/Orbits: No sinus fluid levels or advanced mucosal thickening. No mastoid effusion. Normal orbits. IMPRESSION: Postoperative changes of relatively recent resection of left middle cranial fossa extra-axial tumor with resolution of previously seen extra-axial hemorrhage, pneumocephalus and parenchymal edema. No acute intracranial abnormality. Electronically Signed   By: Ulyses Jarred M.D.   On: 06/01/2017 03:13   Ct Chest Wo Contrast  Result Date: 06/03/2017 CLINICAL DATA:  Dyspnea. Abnormal chest radiograph. Question pneumothorax. History of diabetes. EXAM: CT CHEST WITHOUT CONTRAST TECHNIQUE: Multidetector CT imaging of the chest was performed following the standard protocol without IV contrast. COMPARISON:  Chest radiographs 05/31/2017 and 06/02/2017. No previous CT. FINDINGS: Cardiovascular: Mild aortic  atherosclerosis and cardiomegaly. No acute vascular findings on noncontrast imaging. There is no pericardial effusion. Right arm PICC extends to the superior cavoatrial junction. Mediastinum/Nodes: There are no enlarged mediastinal, hilar or axillary lymph nodes. Suspected previous resection of the left thyroid lobe. Lungs/Pleura: Minimal dependent bilateral pleural effusions. No pneumothorax. There are patchy perihilar ground-glass opacities in both lungs which are worse on the right. There is mild dependent atelectasis and central airway thickening in both lower lobes. No suspicious pulmonary nodule. Upper abdomen: There is soft tissue edema within the omental fat. The pancreas appears unremarkable, although is only partially imaged. Previous cholecystectomy. Musculoskeletal/Chest wall: There is no chest wall mass or suspicious osseous finding. Minimal superior endplate compression deformities in the thoracic spine, similar to lateral chest radiographs 08/15/2015. IMPRESSION: 1. No pneumothorax. 2. Asymmetric right perihilar ground-glass pulmonary opacities consistent with asymmetric edema or early pneumonia. No consolidation. 3. Trace pleural effusions and bibasilar atelectasis. 4. Nonspecific edema within the omental fat, incompletely visualized. Electronically Signed   By: Richardean Sale M.D.   On: 06/03/2017 14:24   Dg Chest Port 1 View  Result Date: 06/02/2017 CLINICAL DATA:  PICC line placement. EXAM: PORTABLE CHEST 1 VIEW COMPARISON:  05/31/2017 chest radiograph FINDINGS: Right PICC line tip projects over the cavoatrial junction. Stable cardiac silhouette given projection and technique. Calcific aortic atherosclerosis. Linear left lung base platelike atelectasis. No new consolidation, effusion, or pneumothorax. Bones are unremarkable. IMPRESSION: Right PICC line tip projects over the cavoatrial junction. Electronically Signed   By: Kristine Garbe M.D.   On: 06/02/2017 14:29   Dg Chest Port  1 View  Result Date: 05/31/2017 CLINICAL DATA:  Rhonchi.  Low oxygen saturation. EXAM: PORTABLE CHEST 1 VIEW COMPARISON:  05/20/2017 FINDINGS: Cardiomediastinal silhouette is normal. Mediastinal contours appear intact. Tortuosity and calcific atherosclerotic disease of the aorta. Curvilinear densities overlying the right hemithorax may be secondary to skin folds however anterior pneumothorax in a supine patient cannot be excluded. Interval development of interstitial pulmonary edema. There is no evidence of focal airspace consolidation, pleural effusion or pneumothorax. Osseous structures are without acute abnormality. Soft tissues are grossly normal. IMPRESSION: Interval development of interstitial pulmonary edema.  Curvilinear densities overlying the right hemithorax may be secondary to skin folds however anterior pneumothorax in a supine patient cannot be excluded. Confirmation with a decubital radiograph, left side down, is recommended. Electronically Signed   By: Fidela Salisbury M.D.   On: 05/31/2017 18:03   Dg Chest Port 1 View  Result Date: 05/20/2017 CLINICAL DATA:  Shortness of breath. EXAM: PORTABLE CHEST 1 VIEW COMPARISON:  05/11/2017 FINDINGS: Cardiac silhouette is normal in size.  No mediastinal or masses. Lung volumes are low. Prominent bronchovascular markings bases accentuated by the low lung volumes. No lung consolidation to suggest pneumonia. No pulmonary edema. No pleural effusion or pneumothorax. Enteric feeding tube passes below the diaphragm and below the included field of view. IMPRESSION: 1. No acute cardiopulmonary disease. 2. Status post removal of the endotracheal tube since the prior exam. Electronically Signed   By: Lajean Manes M.D.   On: 05/20/2017 10:05   Dg Chest Port 1 View  Result Date: 05/11/2017 CLINICAL DATA:  Respiratory failure EXAM: PORTABLE CHEST 1 VIEW COMPARISON:  05/10/2017. FINDINGS: Support tubes and lines are stable. ET tube 3.6 cm above carina. Normal  heart size. Thoracic atherosclerosis. No consolidation or edema. IMPRESSION: Stable chest. Electronically Signed   By: Staci Righter M.D.   On: 05/11/2017 07:44   Dg Chest Port 1 View  Result Date: 05/10/2017 CLINICAL DATA:  Hypoxia EXAM: PORTABLE CHEST 1 VIEW COMPARISON:  May 08, 2017 FINDINGS: Endotracheal tube tip is 2.3 cm above the carina. Feeding tube tip is below the diaphragm. No pneumothorax. There is no edema or consolidation. The heart size and pulmonary vascularity are normal. No adenopathy. There is aortic atherosclerosis. IMPRESSION: Positions as described without pneumothorax. No edema or consolidation. There is aortic atherosclerosis. Aortic Atherosclerosis (ICD10-I70.0). Electronically Signed   By: Lowella Grip III M.D.   On: 05/10/2017 08:18   Dg Chest Port 1 View  Result Date: 05/08/2017 CLINICAL DATA:  Respiratory failure EXAM: PORTABLE CHEST 1 VIEW COMPARISON:  Three days ago FINDINGS: New retrocardiac opacity with volume loss. Normal heart size and mediastinal contours. Endotracheal tube tip is between the clavicular heads and carina. Feeding tube at least reaches the stomach. The left lung is clear. Artifact from EKG leads. IMPRESSION: Interval left lower lobe collapse. Electronically Signed   By: Monte Fantasia M.D.   On: 05/08/2017 08:14   Dg Chest Port 1 View  Result Date: 05/05/2017 CLINICAL DATA:  Failure to wing from mechanical ventilation. EXAM: PORTABLE CHEST 1 VIEW COMPARISON:  Portable chest x-ray of May 02, 2017 FINDINGS: The lungs are adequately inflated and clear. There is no pneumothorax or pleural effusion. The heart and pulmonary vascularity are normal. There is calcification in the wall of the aortic arch. The feeding tube tip projects below the inferior margin of the image. The endotracheal tube tip lies 3 cm above the carina. IMPRESSION: No evidence of pneumonia nor CHF. Thoracic aortic atherosclerosis. The support tubes are in reasonable position.  Electronically Signed   By: Anchor Dwan  [place] M.D.   On: 05/05/2017 09:43   Dg Swallowing Func-speech Pathology  Result Date: 05/17/2017 Objective Swallowing Evaluation: Type of Study: MBS-Modified Barium Swallow Study  Patient Details Name: JOSIAH WOJTASZEK MRN: [id number] Date of Birth: [birth year] Today's Date: 05/17/2017 Time: SLP Start Time (ACUTE ONLY): 1340 -SLP Stop Time (ACUTE ONLY): 1405 SLP Time Calculation (min) (ACUTE ONLY): 25 min Past Medical History: Past Medical History: Diagnosis Date . Blindness  . Diabetes mellitus type I ([place])  . GERD (gastroesophageal  reflux disease)  . Hyperlipidemia  . Paroxysmal atrial fibrillation ([facility])  . Schizophrenia ([place])  . Sleep apnea  . Subarachnoid hemorrhage ([place])  . Systemic hypertension  . Vertigo  Past Surgical History: Past Surgical History: Procedure Laterality Date . BRAIN TUMOR EXCISION   . CRANIOTOMY Left 04/23/2017  Procedure: CRANIOTOMY TUMOR EXCISION;  Surgeon: Earnie Larsson, MD;  Location: [facility];  Service: Neurosurgery;  Laterality: Left;  left . NM MYOCAR PERF WALL MOTION  02/01/2007  no significant ischemia . US ECHOCARDIOGRAPHY  12/20/2003  mild mitral annular ca+,mild MR,TR,PI,AOV mildly sclerotic HPI: 67 year old female with history of Rosai-Dorfman's disease as well as diabetes mellitus and schizophrenia presents with worsening right-sided weakness and aphasia.  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  Patient admitted for treatment with IV steroids and possible later surgical resection.  No history of seizure.  Patient has a history of blindness.  She is status post a remote occipital craniectomy and resection of extradural lesion years ago. MRI shows LEFT dural thickening with 2 dural-based masses again noted. Anterior temporal extra-axial solidly enhancing mass was 16 x 24 mm, now 15 x 21 mm. The more posterior mass was 0.7 x 1.2 cm,  now 2.5 x 3.3 cm corresponding to CT abnormality. Mass effect on LEFT temporal lobes with severe T2 bright presume vasogenic edema LEFT temporal frontal and parietal lobes including posterior limb of the internal capsule. Regional mass effect with 3 mm new LEFT-to-RIGHT midline shift. LEFT uncal herniation. Partial effacement LEFT lateral ventricle without RIGHT ventricle entrapment or, hydrocephalus. Hemosiderin staining mesial LEFT parietal lobe at site of prior hemorrhage. Numerous scattered chronic micro hemorrhages within supra-and infratentorial brain. Moderate to severe parenchymal brain volume loss. No abnormal extra-axial fluid collections. Pt had BSE earlier this admission on 3/13 with primarily cognitively-based dysphagia with limited oral acceptance of POs. With cueing and precautions, it was recommended that she started with sips of water only. Pt was then intubated 3/15 for L craniotomy. Extubation was attempted after the procedure but she required reintubation. Pt then remained intubated until one-way extubation 4/3.  Subjective: pt is alert and keeps her eyes open but makes no attempts to speak Assessment / Plan / Recommendation CHL IP CLINICAL IMPRESSIONS 05/17/2017 Clinical Impression Patient presents with a moderate oropharyngeal dysphagia, exacerbated by acute changes in mentation. Patient initially asleep, requiring max verbal and tactile cueing to arouse enough for po intake, including completion of oral care. Once aroused however, and despite maintaining eyes closed throughout exam, patient willingly opens mouth for intake of bolus with gentle tactile cue to bottom lip. Oral transit then largely normal with both pureed solids and liquids with only intermittently delayed oral transit. Expectoration of soft solid bolus noted. Patient able to protect airway with pureed solids and nectar thick liquids via tsp, penetrating deeply and with question of aspiration (difficult to view given poor posture)  of nectar thick and thin liquid via straw. Only subtle, non-productive throat clear in response.  Unfortunately, patient unable to take cup sips due to combination of poor head control and decreased awareness. Additionally, mild residuals remain post swallow. Although mentation significantly increases risk of aspiration, if family wishes, would consider initiation of pureed solids and nectar thick liquids via spoon when alert. SLP will f/u.  SLP Visit Diagnosis Dysphagia, oropharyngeal phase (R13.12) Attention and concentration deficit following -- Frontal lobe and executive function deficit following -- Impact on  safety and function Moderate aspiration risk;Severe aspiration risk   CHL IP TREATMENT RECOMMENDATION 05/17/2017 Treatment Recommendations Therapy as outlined in treatment plan below   Prognosis 05/17/2017 Prognosis for Safe Diet Advancement Fair Barriers to Reach Goals Cognitive deficits;Other (Comment) Barriers/Prognosis Comment -- CHL IP DIET RECOMMENDATION 05/17/2017 SLP Diet Recommendations Dysphagia 1 (Puree) solids;Nectar thick liquid Liquid Administration via Spoon Medication Administration Crushed with puree Compensations Slow rate;Small sips/bites Postural Changes Seated upright at 90 degrees   CHL IP OTHER RECOMMENDATIONS 05/17/2017 Recommended Consults -- Oral Care Recommendations Oral care BID Other Recommendations Order thickener from pharmacy;Prohibited food (jello, ice cream, thin soups);Remove water pitcher   CHL IP FOLLOW UP RECOMMENDATIONS 05/17/2017 Follow up Recommendations (No Data)   CHL IP FREQUENCY AND DURATION 05/17/2017 Speech Therapy Frequency (ACUTE ONLY) min 3x week Treatment Duration 2 weeks      CHL IP ORAL PHASE 05/17/2017 Oral Phase WFL Oral - Pudding Teaspoon -- Oral - Pudding Cup -- Oral - Honey Teaspoon -- Oral - Honey Cup -- Oral - Nectar Teaspoon -- Oral - Nectar Cup -- Oral - Nectar Straw -- Oral - Thin Teaspoon -- Oral - Thin Cup -- Oral - Thin Straw -- Oral - Puree -- Oral -  Mech Soft -- Oral - Regular -- Oral - Multi-Consistency -- Oral - Pill -- Oral Phase - Comment --  CHL IP PHARYNGEAL PHASE 05/17/2017 Pharyngeal Phase Impaired Pharyngeal- Pudding Teaspoon -- Pharyngeal -- Pharyngeal- Pudding Cup -- Pharyngeal -- Pharyngeal- Honey Teaspoon Delayed swallow initiation-vallecula;Reduced tongue base retraction;Pharyngeal residue - valleculae Pharyngeal -- Pharyngeal- Honey Cup -- Pharyngeal -- Pharyngeal- Nectar Teaspoon Delayed swallow initiation-vallecula;Reduced tongue base retraction;Pharyngeal residue - valleculae Pharyngeal -- Pharyngeal- Nectar Cup -- Pharyngeal -- Pharyngeal- Nectar Straw Delayed swallow initiation-vallecula;Reduced tongue base retraction;Pharyngeal residue - valleculae;Penetration/Aspiration during swallow Pharyngeal Material enters airway, remains ABOVE vocal cords and not ejected out Pharyngeal- Thin Teaspoon Delayed swallow initiation-pyriform sinuses;Reduced tongue base retraction;Pharyngeal residue - valleculae Pharyngeal -- Pharyngeal- Thin Cup -- Pharyngeal -- Pharyngeal- Thin Straw Delayed swallow initiation-vallecula;Reduced tongue base retraction;Pharyngeal residue - valleculae;Penetration/Aspiration during swallow Pharyngeal Material enters airway, CONTACTS cords and not ejected out Pharyngeal- Puree Delayed swallow initiation-vallecula;Reduced tongue base retraction;Pharyngeal residue - valleculae Pharyngeal -- Pharyngeal- Mechanical Soft -- Pharyngeal -- Pharyngeal- Regular -- Pharyngeal -- Pharyngeal- Multi-consistency -- Pharyngeal -- Pharyngeal- Pill -- Pharyngeal -- Pharyngeal Comment --  Gabriel Rainwater MA, CCC-SLP [phone] Gabriel Rainwater Meryl 05/17/2017, 2:41 PM              [place] Ekg Site Rite  Result Date: 06/02/2017 If Site Rite image not attached, placement could not be confirmed due to current cardiac rhythm.   Orson Eva, DO  [facility] Pager [phone]  If 7PM-7AM, please contact night-coverage [email or web address] Password  [email or web address] 06/03/2017, 5:51 PM   LOS: 3 days

## 2017-06-03 NOTE — Evaluation (Signed)
Clinical/Bedside Swallow Evaluation Patient Details  Name: Rachel [name] MRN: [id number] Date of Birth: [birth year]  Today's Date: 06/03/2017 Time: SLP Start Time (ACUTE ONLY): 1761 SLP Stop Time (ACUTE ONLY): 1810 SLP Time Calculation (min) (ACUTE ONLY): 26 min  Past Medical History:  Past Medical History:  Diagnosis Date  . Blindness   . Diabetes mellitus type I ([place])   . GERD (gastroesophageal reflux disease)   . Hyperlipidemia   . Paroxysmal atrial fibrillation (HCC)   . Schizophrenia ([place])   . Sleep apnea   . Subarachnoid hemorrhage ([place])   . Systemic hypertension   . Vertigo    Past Surgical History:  Past Surgical History:  Procedure Laterality Date  . BRAIN TUMOR EXCISION    . CRANIOTOMY Left 04/23/2017   Procedure: CRANIOTOMY TUMOR EXCISION;  Surgeon: Earnie Larsson, MD;  Location: [facility];  Service: Neurosurgery;  Laterality: Left;  left  . NM MYOCAR PERF WALL MOTION  02/01/2007   no significant ischemia  . US ECHOCARDIOGRAPHY  12/20/2003   mild mitral annular ca+,mild MR,TR,PI,AOV mildly sclerotic   HPI:  67 y.o. 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 Pt is s/p craniectomy with microdissection on 04/23/2017=> meningioma.  Apparently per her family she has been less responsive for the past 2 days, and eating less.  CT chest 06/03/17 indicated No pneumothorax. 2. Asymmetric right perihilar ground-glass pulmonary opacities consistent with asymmetric edema or early pneumonia. No Consolidation. 3. Trace pleural effusions and bibasilar atelectasis. 4. Nonspecific edema within the omental fat, incompletely  visualized; MBS on 05/17/17 indicated need for Dysphagia 1 (puree)/nectar-thick via tsp d/t mod-severe aspiration risk/aphonic vocal quality and length of intubation being 20 days during last hospitalization following surgery on 04/23/17.  Assessment / Plan / Recommendation Clinical Impression   Pt with overt s/s of aspiration during intake of Regular/thin liquids (full assist for feeding) with husband providing pt with intake during BSE; aspiration signs/symptoms included cognitive-based dysphagia with decreased mastication, prolonged oral transit, suspected delay in the initiation of the swallow and delayed throat clearing noted during consumption.  MBS completed on 05/17/17 indicating need for puree/nectar-thickened liquids via tsp d/t Patient being able to protect airway with pureed solids and nectar thick liquids via tsp, penetrating deeply and with question of aspiration (difficult to view given poor posture) of nectar thick and thin liquid via straw; pt was aphonic at this time as well; husband reports improvements in vocal quality and response with eating/drinking since D/C; discussed need for objective assessment d/t pt's s/s of potential aspiration/penetration and modifying pt diet with husband voicing request to continue current diet and possibly complete testing during acute stay and/or as an outpatient d/t gains made with consumption at home; ST will f/u while in acute setting for objective testing and diet tolerance/education re: dysphagia/aspiration risk.  SLP Visit Diagnosis: Dysphagia, oropharyngeal phase (R13.12)    Aspiration Risk  Moderate aspiration risk    Diet Recommendation   Dysphagia 2/nectar-thick liquids (via tsp)  Medication Administration: Crushed with puree    Other  Recommendations Oral Care Recommendations: Oral care BID   Follow up Recommendations 24 hour supervision/assistance;Other (comment)(TBD)      Frequency and  Duration min 2x/week  1 week       Prognosis  Prognosis for Safe Diet Advancement: Fair Barriers to Reach Goals: Cognitive deficits;Severity of deficits      Swallow Study   General HPI: 67 y.o. 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 Pt is s/p craniectomy with microdissection on 04/23/2017=> meningioma.  Apparently per her family she has been less responsive for the past 2 days, and eating less.  Type of Study: Bedside Swallow Evaluation Previous Swallow Assessment: MBS completed on 05/17/17 indicating moderate oropharyngeal dysphagia; severe aspiration risk; Dysphagia 1/nectar-thick liquids; family is feeding regular/thin at home Diet Prior to this Study: Regular;Thin liquids Temperature Spikes Noted: No Respiratory Status: Room air History of Recent Intubation: Yes Length of Intubations (days): 20 days Date extubated: 05/12/17 Behavior/Cognition: Alert;Requires cueing Oral Cavity Assessment: Other (comment)(DTA) Oral Care Completed by SLP: Other (Comment)(no; pt eating dinner tray when SLP arrived) Oral Cavity - Dentition: Adequate natural dentition Self-Feeding Abilities: Total assist Patient Positioning: Upright in bed Baseline Vocal Quality: Hoarse;Low vocal intensity;Other (comment)(only observed for one vocalization) Volitional Cough: Cognitively unable to elicit    Oral/Motor/Sensory Function Overall Oral Motor/Sensory Function: Other (comment)(DTA; appears WFL)   Ice Chips Ice chips: Not tested   Thin Liquid Thin Liquid: Impaired Presentation: Cup Pharyngeal  Phase Impairments: Suspected delayed Swallow;Multiple swallows;Throat Clearing - Delayed    Nectar  Thick Nectar Thick Liquid: Not tested Other Comments: (husband doesn't want nectar;discussed MBS results)   Honey Thick Honey Thick Liquid: Not tested   Puree Puree: Not tested Other Comments: (husband feeding regular foods during BSE)   Solid      Solid: Impaired Presentation: Spoon Oral Phase Functional Implications: Prolonged oral transit;Impaired mastication Pharyngeal Phase Impairments: Suspected delayed Swallow;Multiple swallows;Throat Clearing - Delayed        Elvina Sidle, M.S., CCC-SLP 06/03/2017,6:35 PM

## 2017-06-03 NOTE — Progress Notes (Signed)
Daily Progress Note   Patient Name: Rachel [name]       Date: 06/03/2017 DOB: 1950-04-03  Age: 67 y.o. MRN#: [id number] Attending Physician: Orson Eva, MD Primary Care Physician: Iona Beard, MD Admit Date: 05/31/2017  Reason for Consultation/Follow-up: Establishing goals of care and Psychosocial/spiritual support  Subjective: Rachel [name] is resting quietly in bed.  She appears relatively comfortable.  She does not respond to voice or touch.  She minimally flutters her eyes when I do a hard sternal rub.  There is no family at bedside at this time.  Call to husband, Rachel [name].  Left generic voicemail message.  Conference with hospitalist, Dr. Carles Collet related to plan of care and disposition.  Length of Stay: 3  Current Medications: Scheduled Meds:  . aspirin EC  81 mg Oral Daily  . bisacodyl  5 mg Oral Daily  . diltiazem  180 mg Oral Daily  . famotidine  20 mg Oral Daily  . feeding supplement (GLUCERNA SHAKE)  237 mL Oral TID BM  . insulin aspart  0-5 Units Subcutaneous QHS  . insulin aspart  0-9 Units Subcutaneous TID WC  . insulin NPH Human  7 Units Subcutaneous BID AC & HS  . levETIRAcetam  500 mg Oral BID  . multivitamin with minerals  1 tablet Oral Daily  . pantoprazole  40 mg Oral Daily  . PARoxetine  40 mg Oral QHS  . risperiDONE  2 mg Oral QHS  . rosuvastatin  10 mg Oral QPM  . sodium chloride flush  10-40 mL Intracatheter Q12H    Continuous Infusions: . 0.9 % NaCl with KCl 40 mEq / L 75 mL/hr (06/02/17 1514)  . ampicillin-sulbactam (UNASYN) IV      PRN Meds: acetaminophen **OR** acetaminophen, albuterol, ALPRAZolam, loratadine, sodium chloride flush, temazepam  Physical Exam  Constitutional: No distress.  Appears acutely/chronically ill, does not  respond to hard sternal rub.  HENT:  Temporal wasting  Cardiovascular: Normal rate.  Pulmonary/Chest: Effort normal. No respiratory distress.  Abdominal: Soft. She exhibits no distension.  Musculoskeletal: She exhibits no edema.  Neurological:  Does not respond to hard sternal rub  Skin: Skin is warm and dry.  Psychiatric:  Appears relatively comfortable, nonresponsive  Nursing note and vitals reviewed.           Vital Signs:  BP (!) 154/104 (BP Location: Left Arm)   Pulse 68   Temp 97.9 F (36.6 C) (Oral)   Resp 16   Ht 5\' 9"  (1.753 m)   Wt 49.9 kg (110 lb 0.2 oz)   SpO2 100%   BMI 16.25 kg/m  SpO2: SpO2: 100 % O2 Device: O2 Device: Room Air O2 Flow Rate: O2 Flow Rate (L/min): 2 L/min  Intake/output summary:   Intake/Output Summary (Last 24 hours) at 06/03/2017 1525 Last data filed at 06/03/2017 0600 Gross per 24 hour  Intake 997.5 ml  Output -  Net 997.5 ml   LBM: Last BM Date: 05/30/17 Baseline Weight: Weight: 49 kg (108 lb) Most recent weight: Weight: 49.9 kg (110 lb 0.2 oz)       Palliative Assessment/Data:    Flowsheet Rows     Most Recent Value  Intake Tab  Referral Department  Hospitalist  Unit at Time of Referral  Cardiac/Telemetry Unit  Palliative Care Primary Diagnosis  Sepsis/Infectious Disease  Date Notified  06/01/17  Palliative Care Type  Return patient Palliative Care  Reason for referral  Clarify Goals of Care  Date of Admission  05/31/17  Date first seen by Palliative Care  06/02/17  # of days Palliative referral response time  1 Day(s)  # of days IP prior to Palliative referral  1  Clinical Assessment  Palliative Performance Scale Score  20%  Pain Max last 24 hours  Not able to report  Pain Min Last 24 hours  Not able to report  Dyspnea Max Last 24 Hours  Not able to report  Dyspnea Min Last 24 hours  Not able to report  Psychosocial & Spiritual Assessment  Palliative Care Outcomes  Patient/Family meeting held?  Yes  Who was at the  meeting?  Husband Johnny at bedside  [place] goals of care, Provided psychosocial or spiritual support  Patient/Family wishes: Interventions discontinued/not started   Mechanical Ventilation      Patient Active Problem List   Diagnosis Date Noted  . Acute metabolic encephalopathy 42/59/5638  . Aspiration pneumonitis ([place]) 06/02/2017  . Late, effect, cerebrovascular disease   . PICC (peripherally inserted central catheter) in place   . SIRS (systemic inflammatory response syndrome) (HCC)   . Sepsis ([place]) 05/31/2017  . UTI (urinary tract infection) 05/31/2017  . Tachycardia 05/31/2017  . Dysphagia 05/17/2017  . Palliative care by specialist   . Failure to wean from mechanical ventilation ([place])   . Goals of care, counseling/discussion   . Palliative care encounter   . Pressure injury of skin 04/23/2017  . Protein-calorie malnutrition, severe 04/23/2017  . Hypercalcemia 04/20/2017  . Hypokalemia 04/19/2017  . Brain edema ([place]) 04/19/2017  . Neoplasm of brain causing mass effect on adjacent structures ([place]) 04/19/2017  . Fatigue 06/16/2016  . Somnolence, daytime 06/16/2016  . Rosai-Dorfman disease ([place])   . Blind   . Schizophrenia ([place])   . Diabetes mellitus type 2 in nonobese (HCC)   . Acute encephalopathy   . Brain mass   . Subarachnoid hemorrhage following injury, no loss of consciousness ([place]) 09/18/2015  . SAH (subarachnoid hemorrhage) ([place]) 09/17/2015  . Facial droop 12/04/2013  . 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 Blind in both eyes 05/31/2013  . HTN (hypertension) 10/11/2012  . PAF (paroxysmal atrial fibrillation) ([place]) 10/11/2012  . Dyslipidemia 10/11/2012  . H/O brain tumor 10/11/2012  . Insomnia due to mental disorder 12/29/2011  . Libido, decreased 12/29/2011  . Depression 04/14/2011  Palliative Care Assessment & Plan   Patient Profile: 67 y.o.femalewith past medical history of Rosai-Dorfman's disease, atrial fibrillation, HTN, HLD,  DM type 2, schizophrenia, blindnessadmitted on 4/22/2019with flaccid right arm and aphasia r/t brain edema s/t increasing size of known brain lesions treated with steroids and surgical resection 04/23/17, ventilated with poor neurological recovery.  She was able to extubate, but continued to have poor by mouth intake.  She was able to return home with her husband, home health services with [facility].  She has been less responsive over the last 2 days and eating less, therefore her husband brought her to the hospital  Assessment: Sepsis most likely from UTI; urine culture pending, blood cultures pending, continue IV antibiotic and fluid resuscitation.  24 to 48 hours for outcomes.  Recommendations/Plan:  24 to 48 hours for outcomes Seems less responsive today.  No family at bedside.  Goals of Care and Additional Recommendations:  Limitations on Scope of Treatment: Treating the treatable, no CPR, no intubation.  Code Status:    Code Status Orders  (From admission, onward)        Start     Ordered   05/31/17 2259  Do not attempt resuscitation (DNR)  Continuous    Question Answer Comment  In the event of cardiac or respiratory ARREST Do not call a "code blue"   In the event of cardiac or respiratory ARREST Do not perform Intubation, CPR, defibrillation or ACLS   In the event of cardiac or respiratory ARREST Use medication by any route, position, wound care, and other measures to relive pain and suffering. May use oxygen, suction and manual treatment of airway obstruction as needed for comfort.      05/31/17 2259    Code Status History    Date Active Date Inactive Code Status Order ID Comments User Context   05/12/2017 0956 05/21/2017 1756 DNR [id number]  Pershing Proud, NP Inpatient   05/11/2017 1011 05/12/2017 0956 DNR [id number]  Pershing Proud, NP Inpatient   04/19/2017 2239 05/11/2017 1011 Full Code [id number]  Toy Baker, MD ED   09/17/2015 2213 09/19/2015 1516 Full Code [id number]   Wallie Char Inpatient   08/15/2015 1546 08/18/2015 2319 Full Code [id number]  Julianne Rice, MD ED   12/04/2013 1844 12/05/2013 1730 Full Code [id number]  Doree Albee, MD ED    Advance Directive Documentation     Most Recent Value  Type of Advance Directive  Healthcare Power of Attorney  Pre-existing out of facility DNR order (yellow form or pink MOST form)  -  "MOST" Form in Place?  -       Prognosis:   < 4 weeks or less would not be surprising based on functional status, frailty, BMI 15.8, sepsis due to UTI.   Discharge Planning:  To be determined, based on outcomes.  Care plan was discussed with nursing staff, Dr. Carles Collet.  Thank you for allowing the Palliative Medicine Team to assist in the care of this patient.   Time In: 1440 Time Out: 1500 Total Time 20 minutes Prolonged Time Billed  no       Greater than 50%  of this time was spent counseling and coordinating care related to the above assessment and plan.  Drue Novel, NP  Please contact Palliative Medicine Team phone at [phone] for questions and concerns.

## 2017-06-03 NOTE — Progress Notes (Signed)
Inpatient Diabetes Program Recommendations  AACE/ADA: New Consensus Statement on Inpatient Glycemic Control (2015)  Target Ranges:  Prepandial:   less than 140 mg/dL      Peak postprandial:   less than 180 mg/dL (1-2 hours)      Critically ill patients:  140 - 180 mg/dL   Results for Rachel [name], Rachel [name] (MRN [id number]) as of 06/03/2017 07:57  Ref. Range 06/02/2017 16:37 06/02/2017 21:24 06/03/2017 07:27  Glucose-Capillary Latest Ref Range: 65 - 99 mg/dL 295 (H) 240 (H) 211 (H)   Review of Glycemic Control  Diabetes history: DM2 Outpatient Diabetes medications: NPH 18 units BID Current orders for Inpatient glycemic control: NPH 5 units BID, Novolog 0-9 units TID with meals, Novolog 0-5 units QHS  Inpatient Diabetes Program Recommendations: Insulin - Basal: Please consider increasing NPH to 7 untis BID.  Thanks, Barnie Alderman, RN, MSN, CDE Diabetes Coordinator Inpatient Diabetes Program [phone] (Team Pager from 8am to 5pm)

## 2017-06-04 LAB — COMPREHENSIVE METABOLIC PANEL
ALBUMIN: 2.4 g/dL — AB (ref 3.5–5.0)
ALT: 14 U/L (ref 14–54)
AST: 12 U/L — ABNORMAL LOW (ref 15–41)
Alkaline Phosphatase: 56 U/L (ref 38–126)
Anion gap: 6 (ref 5–15)
BILIRUBIN TOTAL: 0.6 mg/dL (ref 0.3–1.2)
BUN: 5 mg/dL — ABNORMAL LOW (ref 6–20)
CO2: 28 mmol/L (ref 22–32)
Calcium: 9.6 mg/dL (ref 8.9–10.3)
Chloride: 106 mmol/L (ref 101–111)
Creatinine, Ser: <0.3 mg/dL — ABNORMAL LOW (ref 0.44–1.00)
GLUCOSE: 139 mg/dL — AB (ref 65–99)
POTASSIUM: 3.6 mmol/L (ref 3.5–5.1)
Sodium: 140 mmol/L (ref 135–145)
TOTAL PROTEIN: 5 g/dL — AB (ref 6.5–8.1)

## 2017-06-04 LAB — GLUCOSE, CAPILLARY: GLUCOSE-CAPILLARY: 121 mg/dL — AB (ref 65–99)

## 2017-06-04 MED ORDER — AMOXICILLIN-POT CLAVULANATE 875-125 MG PO TABS: 1.0000 | ORAL_TABLET | Freq: Two times a day (BID) | ORAL | 0 refills | Status: DC

## 2017-06-04 NOTE — Discharge Summary (Signed)
Physician Discharge Summary  NELMA PHAGAN ASN:[id number] DOB: September 19, 1950 DOA: 05/31/2017  PCP: Iona Beard, MD  Admit date: 05/31/2017 Discharge date: 06/04/2017  Admitted From: Home Disposition:  Home   Recommendations for Outpatient Follow-up:  1. Follow up with PCP in 1-2 weeks 2. Please obtain BMP/CBC in one week 3. 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 At baseline, the patient requires maximal assistance with all her activities of daily living including feeding. At baseline, the patient is not able to carry on a conversation, and she speaks incoherently. The patient was recently discharged from the hospital after a prolonged stay from 04/19/2017 through 05/21/2017. During that hospital stay, the patient was initially admitted secondary to concerns with seizure. She had a craniotomy with resection of extra-axial masses on 04/23/2017. Her hospital stay was prolonged secondary to somnolence respiratory failure, and failure to thrive.Ultimately, the patient underwent LTAC evaluation, but was refused. The patient subsequently went home with total care under her husband. Prior to this admission, there were no complaints of vomiting, diarrhea, uncontrolled pain, respiratory distress. There were no new changes in her medications since her discharge on 05/21/2017.     Discharge Diagnoses:  Acute metabolic encephalopathy -Multifactorial including UTI, aspiration pneumonia, hypercalcemia, and hypnotic medications -Patient  is improving, but remains somnolent intermittently--spouse states pt is near baseline--rarely utters 1 or 2 words at baseline -At baseline, the patient is essentially bedbound and requires maximal assistance with all herIADLs -During her last hospitalization, the patient was noted to have intermittent prolonged episodes of somnolence --EEG neg for seizure  -423 CT brain--postoperative changes, no acute changes;resolution of extra-axial hemorrhage and edema  Hypercalcemia -Likely secondary to herRoasi-Dorfman's syndrome in setting of immobility -intact PTH--32 -vitamin D levels normal -Restart IV fluids -Redose pamidronate x 1 on  4/24 -check PTH-rp--pending -corrected calcium 11.9>>11.2>>10.7  Aspiration pneumonitis -Personally reviewed4/22chest x-ray--coarse interstitial changes -I feel pt's clinical presentation with CXR findings represents aspiration -check MRSA PCR -de-escalate zosyn>>>unasyn 4/25 -06/02/2017 CT chest--asymmetric right perihilar groundglass opacities -home with amox/clav x 3 more days to complete one week of tx  UTI--EColi -de-escalate zosyn>>>unasyn 4/25 -WBC improved  Dysphagia --Speech evaluation on 4/7and MBS on 4/8:Dysphagia 1 diet with close supervision -06/03/17 speech eval-->dys 2 with nectar thickened liquids -Speech therapy evaluation -Continue oral care  Paroxysmal atrial fibrillation -Rate controlled -Continue diltiazem CD -Continue aspirin -Not a good candidate for anticoagulation secondary intracranial tumors  Diabetes mellitus type 2 -Start NovoLog sliding scale -Increase NPH to 7 units bid during hospitalization -04/20/2017 hemoglobin A1c--7.4  Schizophrenia/Anxiety/Depression -Home Trazodone 50 mg per Tube, Risperidone 2 mg per Tube qHS and Paroxetine 40  Hypokalemia -Replete -Check magnesium--1.8  Severe malnutrition -continue supplements  Goals of Care -palliative medicine consulted-->remain DNR -spouse  appears to have some denial about pt's medical condition       Discharge Instructions   Allergies as of 06/04/2017      Reactions   Sulfa Antibiotics Anaphylaxis   Ibuprofen Other (See Comments)   unknown   Shellfish Allergy Swelling   Mouth      Medication List    TAKE these medications   acetaminophen 500 MG tablet Commonly known as:  TYLENOL Take 1,000 mg by mouth every 6 (six) hours as needed for mild pain.   albuterol (2.5 MG/3ML) 0.083% nebulizer solution Commonly known as:  PROVENTIL Take 3 mLs (2.5 mg total) by nebulization every 4 (four) hours as needed for wheezing or shortness of breath.   ALPRAZolam 0.25 MG tablet Commonly known as:  XANAX TAKE 1 TABLET BY MOUTH THREE TIMES DAILY AS NEEDED FOR ANXIETY   amoxicillin-clavulanate 875-125 MG tablet Commonly known as:  AUGMENTIN Take 1 tablet by mouth 2 (two) times daily.   bisacodyl 5 MG EC tablet Commonly known as:  DULCOLAX Take 5 mg by mouth daily.   diltiazem 180 MG 24 hr capsule Commonly known as:  CARDIZEM CD Take 1 capsule (180 mg total) by mouth daily.   famotidine 20 MG tablet Commonly known as:  PEPCID Take 20 mg by mouth daily.   insulin NPH Human 100 UNIT/ML injection Commonly known as:  HUMULIN N,NOVOLIN N Inject 0.18 mLs (18 Units total) into the skin 2 (two) times daily at 8 am and 10 pm.   levETIRAcetam 500 MG tablet Commonly known as:  KEPPRA Take 1 tablet (500 mg total) by mouth 2 (two) times daily.   loratadine 10 MG tablet Commonly known as:  CLARITIN Take 10 mg by mouth daily as needed for allergies.   multivitamin with minerals Tabs tablet Take 1 tablet by mouth daily.   ondansetron 4 MG tablet Commonly known as:  ZOFRAN Take 1 tablet (4 mg total) by mouth every 6 (six) hours as needed for nausea.   pantoprazole 40 MG tablet Commonly known as:  PROTONIX Take 1 tablet (40 mg total) by mouth daily.   PARoxetine 40 MG tablet Commonly known as:  PAXIL Take 1 tablet  (40 mg total) at bedtime by mouth.   polyethylene glycol packet Commonly known as:  MIRALAX / GLYCOLAX Place 17 g into feeding tube daily.   risperiDONE 2 MG tablet Commonly known as:  RISPERDAL Take 1 tablet (2 mg total) by mouth at bedtime.   rosuvastatin 10 MG tablet Commonly known as:  CRESTOR Take 1 tablet (10 mg total) by mouth every evening.   temazepam 15 MG capsule Commonly known as:  RESTORIL Take 1 capsule (15 mg total) at bedtime as needed by mouth for sleep.   traZODone 50 MG tablet Commonly known as:  DESYREL Take 1 tablet (50 mg total) by mouth at bedtime.       Allergies  Allergen Reactions  . Sulfa Antibiotics Anaphylaxis  . Ibuprofen Other (See Comments)    unknown  . Shellfish Allergy Swelling    Mouth    Consultations:  palliative   Procedures/Studies: Ct Head Wo Contrast  Result Date: 06/01/2017 CLINICAL DATA:  History of Rosai-Dorfman disease with multiple prior cranial surgeries for extradural tumor resection. Altered mental status. EXAM: CT HEAD WITHOUT CONTRAST TECHNIQUE: Contiguous axial images were obtained from the base of the skull through the vertex without intravenous contrast. COMPARISON:  Brain MRI 04/28/2017 and head CT 04/26/2017 FINDINGS: Brain: No mass lesion, intraparenchymal hemorrhage or extra-axial collection. No evidence of acute cortical infarct. There is encephalomalacia of the left anterior temporal lobe. The degree of edema seen at this location on the prior study has resolved. There is smooth density along the dura of the left convexity, underlying the craniotomy site. No hydrocephalus. Generalized atrophy pattern is unchanged. Vascular: No hyperdense vessel or unexpected vascular calcification. Skull: Remote suboccipital craniectomy and left parietotemporal craniotomy. Sinuses/Orbits: No sinus fluid levels  or advanced mucosal thickening. No mastoid effusion. Normal orbits. IMPRESSION: Postoperative changes of relatively recent  resection of left middle cranial fossa extra-axial tumor with resolution of previously seen extra-axial hemorrhage, pneumocephalus and parenchymal edema. No acute intracranial abnormality. Electronically Signed   By: Ulyses Jarred M.D.   On: 06/01/2017 03:13   Ct Chest Wo Contrast  Result Date: 06/03/2017 CLINICAL DATA:  Dyspnea. Abnormal chest radiograph. Question pneumothorax. History of diabetes. EXAM: CT CHEST WITHOUT CONTRAST TECHNIQUE: Multidetector CT imaging of the chest was performed following the standard protocol without IV contrast. COMPARISON:  Chest radiographs 05/31/2017 and 06/02/2017. No previous CT. FINDINGS: Cardiovascular: Mild aortic atherosclerosis and cardiomegaly. No acute vascular findings on noncontrast imaging. There is no pericardial effusion. Right arm PICC extends to the superior cavoatrial junction. Mediastinum/Nodes: There are no enlarged mediastinal, hilar or axillary lymph nodes. Suspected previous resection of the left thyroid lobe. Lungs/Pleura: Minimal dependent bilateral pleural effusions. No pneumothorax. There are patchy perihilar ground-glass opacities in both lungs which are worse on the right. There is mild dependent atelectasis and central airway thickening in both lower lobes. No suspicious pulmonary nodule. Upper abdomen: There is soft tissue edema within the omental fat. The pancreas appears unremarkable, although is only partially imaged. Previous cholecystectomy. Musculoskeletal/Chest wall: There is no chest wall mass or suspicious osseous finding. Minimal superior endplate compression deformities in the thoracic spine, similar to lateral chest radiographs 08/15/2015. IMPRESSION: 1. No pneumothorax. 2. Asymmetric right perihilar ground-glass pulmonary opacities consistent with asymmetric edema or early pneumonia. No consolidation. 3. Trace pleural effusions and bibasilar atelectasis. 4. Nonspecific edema within the omental fat, incompletely visualized.  Electronically Signed   By: Richardean Sale M.D.   On: 06/03/2017 14:24   Dg Chest Port 1 View  Result Date: 06/02/2017 CLINICAL DATA:  PICC line placement. EXAM: PORTABLE CHEST 1 VIEW COMPARISON:  05/31/2017 chest radiograph FINDINGS: Right PICC line tip projects over the cavoatrial junction. Stable cardiac silhouette given projection and technique. Calcific aortic atherosclerosis. Linear left lung base platelike atelectasis. No new consolidation, effusion, or pneumothorax. Bones are unremarkable. IMPRESSION: Right PICC line tip projects over the cavoatrial junction. Electronically Signed   By: Kristine Garbe M.D.   On: 06/02/2017 14:29   Dg Chest Port 1 View  Result Date: 05/31/2017 CLINICAL DATA:  Rhonchi.  Low oxygen saturation. EXAM: PORTABLE CHEST 1 VIEW COMPARISON:  05/20/2017 FINDINGS: Cardiomediastinal silhouette is normal. Mediastinal contours appear intact. Tortuosity and calcific atherosclerotic disease of the aorta. Curvilinear densities overlying the right hemithorax may be secondary to skin folds however anterior pneumothorax in a supine patient cannot be excluded. Interval development of interstitial pulmonary edema. There is no evidence of focal airspace consolidation, pleural effusion or pneumothorax. Osseous structures are without acute abnormality. Soft tissues are grossly normal. IMPRESSION: Interval development of interstitial pulmonary edema. Curvilinear densities overlying the right hemithorax may be secondary to skin folds however anterior pneumothorax in a supine patient cannot be excluded. Confirmation with a decubital radiograph, left side down, is recommended. Electronically Signed   By: Fidela Salisbury M.D.   On: 05/31/2017 18:03   Dg Chest Port 1 View  Result Date: 05/20/2017 CLINICAL DATA:  Shortness of breath. EXAM: PORTABLE CHEST 1 VIEW COMPARISON:  05/11/2017 FINDINGS: Cardiac silhouette is normal in size.  No mediastinal or masses. Lung volumes are low.  Prominent bronchovascular markings bases accentuated by the low lung volumes. No lung consolidation to suggest pneumonia. No pulmonary edema. No pleural effusion or pneumothorax. Enteric feeding tube passes below the diaphragm  and below the included field of view. IMPRESSION: 1. No acute cardiopulmonary disease. 2. Status post removal of the endotracheal tube since the prior exam. Electronically Signed   By: Lajean Manes M.D.   On: 05/20/2017 10:05   Dg Chest Port 1 View  Result Date: 05/11/2017 CLINICAL DATA:  Respiratory failure EXAM: PORTABLE CHEST 1 VIEW COMPARISON:  05/10/2017. FINDINGS: Support tubes and lines are stable. ET tube 3.6 cm above carina. Normal heart size. Thoracic atherosclerosis. No consolidation or edema. IMPRESSION: Stable chest. Electronically Signed   By: Staci Righter M.D.   On: 05/11/2017 07:44   Dg Chest Port 1 View  Result Date: 05/10/2017 CLINICAL DATA:  Hypoxia EXAM: PORTABLE CHEST 1 VIEW COMPARISON:  May 08, 2017 FINDINGS: Endotracheal tube tip is 2.3 cm above the carina. Feeding tube tip is below the diaphragm. No pneumothorax. There is no edema or consolidation. The heart size and pulmonary vascularity are normal. No adenopathy. There is aortic atherosclerosis. IMPRESSION: Positions as described without pneumothorax. No edema or consolidation. There is aortic atherosclerosis. Aortic Atherosclerosis (ICD10-I70.0). Electronically Signed   By: Lowella Grip III M.D.   On: 05/10/2017 08:18   Dg Chest Port 1 View  Result Date: 05/08/2017 CLINICAL DATA:  Respiratory failure EXAM: PORTABLE CHEST 1 VIEW COMPARISON:  Three days ago FINDINGS: New retrocardiac opacity with volume loss. Normal heart size and mediastinal contours. Endotracheal tube tip is between the clavicular heads and carina. Feeding tube at least reaches the stomach. The left lung is clear. Artifact from EKG leads. IMPRESSION: Interval left lower lobe collapse. Electronically Signed   By: Monte Fantasia  M.D.   On: 05/08/2017 08:14   Dg Swallowing Func-speech Pathology  Result Date: 05/17/2017 Objective Swallowing Evaluation: Type of Study: MBS-Modified Barium Swallow Study  Patient Details Name: KATERINA ZURN MRN: [id number] Date of Birth: [birth year] Today's Date: 05/17/2017 Time: SLP Start Time (ACUTE ONLY): 1340 -SLP Stop Time (ACUTE ONLY): 1405 SLP Time Calculation (min) (ACUTE ONLY): 25 min Past Medical History: Past Medical History: Diagnosis Date . Blindness  . Diabetes mellitus type I ([place])  . GERD (gastroesophageal reflux disease)  . Hyperlipidemia  . Paroxysmal atrial fibrillation ([facility])  . Schizophrenia ([place])  . Sleep apnea  . Subarachnoid hemorrhage ([place])  . Systemic hypertension  . Vertigo  Past Surgical History: Past Surgical History: Procedure Laterality Date . BRAIN TUMOR EXCISION   . CRANIOTOMY Left 04/23/2017  Procedure: CRANIOTOMY TUMOR EXCISION;  Surgeon: Earnie Larsson, MD;  Location: [facility];  Service: Neurosurgery;  Laterality: Left;  left . NM MYOCAR PERF WALL MOTION  02/01/2007  no significant ischemia . US ECHOCARDIOGRAPHY  12/20/2003  mild mitral annular ca+,mild MR,TR,PI,AOV mildly sclerotic HPI: 67 year old female with history of Rosai-Dorfman's disease as well as diabetes mellitus and schizophrenia presents with worsening right-sided weakness and aphasia.  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  Patient admitted for treatment with IV steroids and possible later surgical resection.  No history of seizure.  Patient has a history of blindness.  She is status post a remote occipital craniectomy and resection of extradural lesion years ago. MRI shows LEFT dural thickening with 2 dural-based masses again noted. Anterior temporal extra-axial solidly enhancing mass was 16 x 24 mm, now 15 x 21 mm. The more posterior mass was 0.7 x 1.2 cm, now 2.5 x 3.3 cm corresponding to CT  abnormality. Mass effect on LEFT temporal lobes  with severe T2 bright presume vasogenic edema LEFT temporal frontal and parietal lobes including posterior limb of the internal capsule. Regional mass effect with 3 mm new LEFT-to-RIGHT midline shift. LEFT uncal herniation. Partial effacement LEFT lateral ventricle without RIGHT ventricle entrapment or, hydrocephalus. Hemosiderin staining mesial LEFT parietal lobe at site of prior hemorrhage. Numerous scattered chronic micro hemorrhages within supra-and infratentorial brain. Moderate to severe parenchymal brain volume loss. No abnormal extra-axial fluid collections. Pt had BSE earlier this admission on 3/13 with primarily cognitively-based dysphagia with limited oral acceptance of POs. With cueing and precautions, it was recommended that she started with sips of water only. Pt was then intubated 3/15 for L craniotomy. Extubation was attempted after the procedure but she required reintubation. Pt then remained intubated until one-way extubation 4/3.  Subjective: pt is alert and keeps her eyes open but makes no attempts to speak Assessment / Plan / Recommendation CHL IP CLINICAL IMPRESSIONS 05/17/2017 Clinical Impression Patient presents with a moderate oropharyngeal dysphagia, exacerbated by acute changes in mentation. Patient initially asleep, requiring max verbal and tactile cueing to arouse enough for po intake, including completion of oral care. Once aroused however, and despite maintaining eyes closed throughout exam, patient willingly opens mouth for intake of bolus with gentle tactile cue to bottom lip. Oral transit then largely normal with both pureed solids and liquids with only intermittently delayed oral transit. Expectoration of soft solid bolus noted. Patient able to protect airway with pureed solids and nectar thick liquids via tsp, penetrating deeply and with question of aspiration (difficult to view given poor posture) of nectar thick and thin liquid via  straw. Only subtle, non-productive throat clear in response.  Unfortunately, patient unable to take cup sips due to combination of poor head control and decreased awareness. Additionally, mild residuals remain post swallow. Although mentation significantly increases risk of aspiration, if family wishes, would consider initiation of pureed solids and nectar thick liquids via spoon when alert. SLP will f/u.  SLP Visit Diagnosis Dysphagia, oropharyngeal phase (R13.12) Attention and concentration deficit following -- Frontal lobe and executive function deficit following -- Impact on safety and function Moderate aspiration risk;Severe aspiration risk   CHL IP TREATMENT RECOMMENDATION 05/17/2017 Treatment Recommendations Therapy as outlined in treatment plan below   Prognosis 05/17/2017 Prognosis for Safe Diet Advancement Fair Barriers to Reach Goals Cognitive deficits;Other (Comment) Barriers/Prognosis Comment -- CHL IP DIET RECOMMENDATION 05/17/2017 SLP Diet Recommendations Dysphagia 1 (Puree) solids;Nectar thick liquid Liquid Administration via Spoon Medication Administration Crushed with puree Compensations Slow rate;Small sips/bites Postural Changes Seated upright at 90 degrees   CHL IP OTHER RECOMMENDATIONS 05/17/2017 Recommended Consults -- Oral Care Recommendations Oral care BID Other Recommendations Order thickener from pharmacy;Prohibited food (jello, ice cream, thin soups);Remove water pitcher   CHL IP FOLLOW UP RECOMMENDATIONS 05/17/2017 Follow up Recommendations (No Data)   CHL IP FREQUENCY AND DURATION 05/17/2017 Speech Therapy Frequency (ACUTE ONLY) min 3x week Treatment Duration 2 weeks      CHL IP ORAL PHASE 05/17/2017 Oral Phase WFL Oral - Pudding Teaspoon -- Oral - Pudding Cup -- Oral - Honey Teaspoon -- Oral - Honey Cup -- Oral - Nectar Teaspoon -- Oral - Nectar Cup -- Oral - Nectar Straw -- Oral - Thin Teaspoon -- Oral - Thin Cup -- Oral - Thin Straw -- Oral - Puree -- Oral - Mech Soft -- Oral - Regular -- Oral -  Multi-Consistency -- Oral - Pill -- Oral Phase - Comment --  CHL IP PHARYNGEAL PHASE 05/17/2017 Pharyngeal  Phase Impaired Pharyngeal- Pudding Teaspoon -- Pharyngeal -- Pharyngeal- Pudding Cup -- Pharyngeal -- Pharyngeal- Honey Teaspoon Delayed swallow initiation-vallecula;Reduced tongue base retraction;Pharyngeal residue - valleculae Pharyngeal -- Pharyngeal- Honey Cup -- Pharyngeal -- Pharyngeal- Nectar Teaspoon Delayed swallow initiation-vallecula;Reduced tongue base retraction;Pharyngeal residue - valleculae Pharyngeal -- Pharyngeal- Nectar Cup -- Pharyngeal -- Pharyngeal- Nectar Straw Delayed swallow initiation-vallecula;Reduced tongue base retraction;Pharyngeal residue - valleculae;Penetration/Aspiration during swallow Pharyngeal Material enters airway, remains ABOVE vocal cords and not ejected out Pharyngeal- Thin Teaspoon Delayed swallow initiation-pyriform sinuses;Reduced tongue base retraction;Pharyngeal residue - valleculae Pharyngeal -- Pharyngeal- Thin Cup -- Pharyngeal -- Pharyngeal- Thin Straw Delayed swallow initiation-vallecula;Reduced tongue base retraction;Pharyngeal residue - valleculae;Penetration/Aspiration during swallow Pharyngeal Material enters airway, CONTACTS cords and not ejected out Pharyngeal- Puree Delayed swallow initiation-vallecula;Reduced tongue base retraction;Pharyngeal residue - valleculae Pharyngeal -- Pharyngeal- Mechanical Soft -- Pharyngeal -- Pharyngeal- Regular -- Pharyngeal -- Pharyngeal- Multi-consistency -- Pharyngeal -- Pharyngeal- Pill -- Pharyngeal -- Pharyngeal Comment --  Gabriel Rainwater MA, CCC-SLP [phone] Gabriel Rainwater Meryl 05/17/2017, 2:41 PM              [place] Ekg Site Rite  Result Date: 06/02/2017 If Site Rite image not attached, placement could not be confirmed due to current cardiac rhythm.       Discharge Exam: Vitals:   06/04/17 0630 06/04/17 0651  BP:  (!) 152/76  Pulse:  (!) 59  Resp:    Temp:    SpO2: 92%    Vitals:   06/03/17 2247 06/04/17  0625 06/04/17 0630 06/04/17 0651  BP: (!) 142/76 (!) 197/87  (!) 152/76  Pulse: 65 62  (!) 59  Resp: 16 (!) 22    Temp: 98.5 F (36.9 C) 98.6 F (37 C)    TempSrc: Axillary Oral    SpO2: 98% (!) 85% 92%   Weight:   50.7 kg (111 lb 12.4 oz)   Height:        General: Pt is alert, awake, not in acute distress Cardiovascular: RRR, S1/S2 +, no rubs, no gallops Respiratory: CTA bilaterally, no wheezing, no rhonchi Abdominal: Soft, NT, ND, bowel sounds + Extremities: no edema, no cyanosis   The results of significant diagnostics from this hospitalization (including imaging, microbiology, ancillary and laboratory) are listed below for reference.    Significant Diagnostic Studies: Ct Head Wo Contrast  Result Date: 06/01/2017 CLINICAL DATA:  History of Rosai-Dorfman disease with multiple prior cranial surgeries for extradural tumor resection. Altered mental status. EXAM: CT HEAD WITHOUT CONTRAST TECHNIQUE: Contiguous axial images were obtained from the base of the skull through the vertex without intravenous contrast. COMPARISON:  Brain MRI 04/28/2017 and head CT 04/26/2017 FINDINGS: Brain: No mass lesion, intraparenchymal hemorrhage or extra-axial collection. No evidence of acute cortical infarct. There is encephalomalacia of the left anterior temporal lobe. The degree of edema seen at this location on the prior study has resolved. There is smooth density along the dura of the left convexity, underlying the craniotomy site. No hydrocephalus. Generalized atrophy pattern is unchanged. Vascular: No hyperdense vessel or unexpected vascular calcification. Skull: Remote suboccipital craniectomy and left parietotemporal craniotomy. Sinuses/Orbits: No sinus fluid levels or advanced mucosal thickening. No mastoid effusion. Normal orbits. IMPRESSION: Postoperative changes of relatively recent resection of left middle cranial fossa extra-axial tumor with resolution of previously seen extra-axial hemorrhage,  pneumocephalus and parenchymal edema. No acute intracranial abnormality. Electronically Signed   By: Ulyses Jarred M.D.   On: 06/01/2017 03:13   Ct Chest Wo Contrast  Result Date: 06/03/2017 CLINICAL DATA:  Dyspnea. Abnormal chest radiograph. Question pneumothorax. History of diabetes. EXAM: CT CHEST WITHOUT CONTRAST TECHNIQUE: Multidetector CT imaging of the chest was performed following the standard protocol without IV contrast. COMPARISON:  Chest radiographs 05/31/2017 and 06/02/2017. No previous CT. FINDINGS: Cardiovascular: Mild aortic atherosclerosis and cardiomegaly. No acute vascular findings on noncontrast imaging. There is no pericardial effusion. Right arm PICC extends to the superior cavoatrial junction. Mediastinum/Nodes: There are no enlarged mediastinal, hilar or axillary lymph nodes. Suspected previous resection of the left thyroid lobe. Lungs/Pleura: Minimal dependent bilateral pleural effusions. No pneumothorax. There are patchy perihilar ground-glass opacities in both lungs which are worse on the right. There is mild dependent atelectasis and central airway thickening in both lower lobes. No suspicious pulmonary nodule. Upper abdomen: There is soft tissue edema within the omental fat. The pancreas appears unremarkable, although is only partially imaged. Previous cholecystectomy. Musculoskeletal/Chest wall: There is no chest wall mass or suspicious osseous finding. Minimal superior endplate compression deformities in the thoracic spine, similar to lateral chest radiographs 08/15/2015. IMPRESSION: 1. No pneumothorax. 2. Asymmetric right perihilar ground-glass pulmonary opacities consistent with asymmetric edema or early pneumonia. No consolidation. 3. Trace pleural effusions and bibasilar atelectasis. 4. Nonspecific edema within the omental fat, incompletely visualized. Electronically Signed   By: Richardean Sale M.D.   On: 06/03/2017 14:24   Dg Chest Port 1 View  Result Date:  06/02/2017 CLINICAL DATA:  PICC line placement. EXAM: PORTABLE CHEST 1 VIEW COMPARISON:  05/31/2017 chest radiograph FINDINGS: Right PICC line tip projects over the cavoatrial junction. Stable cardiac silhouette given projection and technique. Calcific aortic atherosclerosis. Linear left lung base platelike atelectasis. No new consolidation, effusion, or pneumothorax. Bones are unremarkable. IMPRESSION: Right PICC line tip projects over the cavoatrial junction. Electronically Signed   By: Kristine Garbe M.D.   On: 06/02/2017 14:29   Dg Chest Port 1 View  Result Date: 05/31/2017 CLINICAL DATA:  Rhonchi.  Low oxygen saturation. EXAM: PORTABLE CHEST 1 VIEW COMPARISON:  05/20/2017 FINDINGS: Cardiomediastinal silhouette is normal. Mediastinal contours appear intact. Tortuosity and calcific atherosclerotic disease of the aorta. Curvilinear densities overlying the right hemithorax may be secondary to skin folds however anterior pneumothorax in a supine patient cannot be excluded. Interval development of interstitial pulmonary edema. There is no evidence of focal airspace consolidation, pleural effusion or pneumothorax. Osseous structures are without acute abnormality. Soft tissues are grossly normal. IMPRESSION: Interval development of interstitial pulmonary edema. Curvilinear densities overlying the right hemithorax may be secondary to skin folds however anterior pneumothorax in a supine patient cannot be excluded. Confirmation with a decubital radiograph, left side down, is recommended. Electronically Signed   By: Fidela Salisbury M.D.   On: 05/31/2017 18:03   Dg Chest Port 1 View  Result Date: 05/20/2017 CLINICAL DATA:  Shortness of breath. EXAM: PORTABLE CHEST 1 VIEW COMPARISON:  05/11/2017 FINDINGS: Cardiac silhouette is normal in size.  No mediastinal or masses. Lung volumes are low. Prominent bronchovascular markings bases accentuated by the low lung volumes. No lung consolidation to suggest  pneumonia. No pulmonary edema. No pleural effusion or pneumothorax. Enteric feeding tube passes below the diaphragm and below the included field of view. IMPRESSION: 1. No acute cardiopulmonary disease. 2. Status post removal of the endotracheal tube since the prior exam. Electronically Signed   By: Lajean Manes M.D.   On: 05/20/2017 10:05   Dg Chest Port 1 View  Result Date: 05/11/2017 CLINICAL DATA:  Respiratory failure EXAM: PORTABLE CHEST 1 VIEW COMPARISON:  05/10/2017. FINDINGS: Support tubes  and lines are stable. ET tube 3.6 cm above carina. Normal heart size. Thoracic atherosclerosis. No consolidation or edema. IMPRESSION: Stable chest. Electronically Signed   By: Staci Righter M.D.   On: 05/11/2017 07:44   Dg Chest Port 1 View  Result Date: 05/10/2017 CLINICAL DATA:  Hypoxia EXAM: PORTABLE CHEST 1 VIEW COMPARISON:  May 08, 2017 FINDINGS: Endotracheal tube tip is 2.3 cm above the carina. Feeding tube tip is below the diaphragm. No pneumothorax. There is no edema or consolidation. The heart size and pulmonary vascularity are normal. No adenopathy. There is aortic atherosclerosis. IMPRESSION: Positions as described without pneumothorax. No edema or consolidation. There is aortic atherosclerosis. Aortic Atherosclerosis (ICD10-I70.0). Electronically Signed   By: Lowella Grip III M.D.   On: 05/10/2017 08:18   Dg Chest Port 1 View  Result Date: 05/08/2017 CLINICAL DATA:  Respiratory failure EXAM: PORTABLE CHEST 1 VIEW COMPARISON:  Three days ago FINDINGS: New retrocardiac opacity with volume loss. Normal heart size and mediastinal contours. Endotracheal tube tip is between the clavicular heads and carina. Feeding tube at least reaches the stomach. The left lung is clear. Artifact from EKG leads. IMPRESSION: Interval left lower lobe collapse. Electronically Signed   By: Monte Fantasia M.D.   On: 05/08/2017 08:14   Dg Swallowing Func-speech Pathology  Result Date: 05/17/2017 Objective Swallowing  Evaluation: Type of Study: MBS-Modified Barium Swallow Study  Patient Details Name: ATINA FEELEY MRN: [id number] Date of Birth: Aug 17, 1950 Today's Date: 05/17/2017 Time: SLP Start Time (ACUTE ONLY): 1340 -SLP Stop Time (ACUTE ONLY): 1405 SLP Time Calculation (min) (ACUTE ONLY): 25 min Past Medical History: Past Medical History: Diagnosis Date . Blindness  . Diabetes mellitus type I ([place])  . GERD (gastroesophageal reflux disease)  . Hyperlipidemia  . Paroxysmal atrial fibrillation ([facility])  . Schizophrenia ([place])  . Sleep apnea  . Subarachnoid hemorrhage ([place])  . Systemic hypertension  . Vertigo  Past Surgical History: Past Surgical History: Procedure Laterality Date . BRAIN TUMOR EXCISION   . CRANIOTOMY Left 04/23/2017  Procedure: CRANIOTOMY TUMOR EXCISION;  Surgeon: Earnie Larsson, MD;  Location: [facility];  Service: Neurosurgery;  Laterality: Left;  left . NM MYOCAR PERF WALL MOTION  02/01/2007  no significant ischemia . US ECHOCARDIOGRAPHY  12/20/2003  mild mitral annular ca+,mild MR,TR,PI,AOV mildly sclerotic HPI: 67 year old female with history of Rosai-Dorfman's disease as well as diabetes mellitus and schizophrenia presents with worsening right-sided weakness and aphasia.  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  Patient admitted for treatment with IV steroids and possible later surgical resection.  No history of seizure.  Patient has a history of blindness.  She is status post a remote occipital craniectomy and resection of extradural lesion years ago. MRI shows LEFT dural thickening with 2 dural-based masses again noted. Anterior temporal extra-axial solidly enhancing mass was 16 x 24 mm, now 15 x 21 mm. The more posterior mass was 0.7 x 1.2 cm, now 2.5 x 3.3 cm corresponding to CT abnormality. Mass effect on LEFT temporal lobes with severe T2 bright presume vasogenic edema LEFT temporal frontal and  parietal lobes including posterior limb of the internal capsule. Regional mass effect with 3 mm new LEFT-to-RIGHT midline shift. LEFT uncal herniation. Partial effacement LEFT lateral ventricle without RIGHT ventricle entrapment or, hydrocephalus. Hemosiderin staining mesial LEFT parietal lobe at site of prior hemorrhage. Numerous scattered chronic micro hemorrhages within supra-and infratentorial brain. Moderate to  severe parenchymal brain volume loss. No abnormal extra-axial fluid collections. Pt had BSE earlier this admission on 3/13 with primarily cognitively-based dysphagia with limited oral acceptance of POs. With cueing and precautions, it was recommended that she started with sips of water only. Pt was then intubated 3/15 for L craniotomy. Extubation was attempted after the procedure but she required reintubation. Pt then remained intubated until one-way extubation 4/3.  Subjective: pt is alert and keeps her eyes open but makes no attempts to speak Assessment / Plan / Recommendation CHL IP CLINICAL IMPRESSIONS 05/17/2017 Clinical Impression Patient presents with a moderate oropharyngeal dysphagia, exacerbated by acute changes in mentation. Patient initially asleep, requiring max verbal and tactile cueing to arouse enough for po intake, including completion of oral care. Once aroused however, and despite maintaining eyes closed throughout exam, patient willingly opens mouth for intake of bolus with gentle tactile cue to bottom lip. Oral transit then largely normal with both pureed solids and liquids with only intermittently delayed oral transit. Expectoration of soft solid bolus noted. Patient able to protect airway with pureed solids and nectar thick liquids via tsp, penetrating deeply and with question of aspiration (difficult to view given poor posture) of nectar thick and thin liquid via straw. Only subtle, non-productive throat clear in response.  Unfortunately, patient unable to take cup sips due to  combination of poor head control and decreased awareness. Additionally, mild residuals remain post swallow. Although mentation significantly increases risk of aspiration, if family wishes, would consider initiation of pureed solids and nectar thick liquids via spoon when alert. SLP will f/u.  SLP Visit Diagnosis Dysphagia, oropharyngeal phase (R13.12) Attention and concentration deficit following -- Frontal lobe and executive function deficit following -- Impact on safety and function Moderate aspiration risk;Severe aspiration risk   CHL IP TREATMENT RECOMMENDATION 05/17/2017 Treatment Recommendations Therapy as outlined in treatment plan below   Prognosis 05/17/2017 Prognosis for Safe Diet Advancement Fair Barriers to Reach Goals Cognitive deficits;Other (Comment) Barriers/Prognosis Comment -- CHL IP DIET RECOMMENDATION 05/17/2017 SLP Diet Recommendations Dysphagia 1 (Puree) solids;Nectar thick liquid Liquid Administration via Spoon Medication Administration Crushed with puree Compensations Slow rate;Small sips/bites Postural Changes Seated upright at 90 degrees   CHL IP OTHER RECOMMENDATIONS 05/17/2017 Recommended Consults -- Oral Care Recommendations Oral care BID Other Recommendations Order thickener from pharmacy;Prohibited food (jello, ice cream, thin soups);Remove water pitcher   CHL IP FOLLOW UP RECOMMENDATIONS 05/17/2017 Follow up Recommendations (No Data)   CHL IP FREQUENCY AND DURATION 05/17/2017 Speech Therapy Frequency (ACUTE ONLY) min 3x week Treatment Duration 2 weeks      CHL IP ORAL PHASE 05/17/2017 Oral Phase WFL Oral - Pudding Teaspoon -- Oral - Pudding Cup -- Oral - Honey Teaspoon -- Oral - Honey Cup -- Oral - Nectar Teaspoon -- Oral - Nectar Cup -- Oral - Nectar Straw -- Oral - Thin Teaspoon -- Oral - Thin Cup -- Oral - Thin Straw -- Oral - Puree -- Oral - Mech Soft -- Oral - Regular -- Oral - Multi-Consistency -- Oral - Pill -- Oral Phase - Comment --  CHL IP PHARYNGEAL PHASE 05/17/2017 Pharyngeal Phase  Impaired Pharyngeal- Pudding Teaspoon -- Pharyngeal -- Pharyngeal- Pudding Cup -- Pharyngeal -- Pharyngeal- Honey Teaspoon Delayed swallow initiation-vallecula;Reduced tongue base retraction;Pharyngeal residue - valleculae Pharyngeal -- Pharyngeal- Honey Cup -- Pharyngeal -- Pharyngeal- Nectar Teaspoon Delayed swallow initiation-vallecula;Reduced tongue base retraction;Pharyngeal residue - valleculae Pharyngeal -- Pharyngeal- Nectar Cup -- Pharyngeal -- Pharyngeal- Nectar Straw Delayed swallow initiation-vallecula;Reduced tongue base retraction;Pharyngeal residue - valleculae;Penetration/Aspiration  during swallow Pharyngeal Material enters airway, remains ABOVE vocal cords and not ejected out Pharyngeal- Thin Teaspoon Delayed swallow initiation-pyriform sinuses;Reduced tongue base retraction;Pharyngeal residue - valleculae Pharyngeal -- Pharyngeal- Thin Cup -- Pharyngeal -- Pharyngeal- Thin Straw Delayed swallow initiation-vallecula;Reduced tongue base retraction;Pharyngeal residue - valleculae;Penetration/Aspiration during swallow Pharyngeal Material enters airway, CONTACTS cords and not ejected out Pharyngeal- Puree Delayed swallow initiation-vallecula;Reduced tongue base retraction;Pharyngeal residue - valleculae Pharyngeal -- Pharyngeal- Mechanical Soft -- Pharyngeal -- Pharyngeal- Regular -- Pharyngeal -- Pharyngeal- Multi-consistency -- Pharyngeal -- Pharyngeal- Pill -- Pharyngeal -- Pharyngeal Comment --  Gabriel Rainwater MA, CCC-SLP [phone] Gabriel Rainwater Meryl 05/17/2017, 2:41 PM              [place] Ekg Site Rite  Result Date: 06/02/2017 If Site Rite image not attached, placement could not be confirmed due to current cardiac rhythm.    Microbiology: Recent Results (from the past 240 hour(s))  Blood culture (routine x 2)     Status: None (Preliminary result)   Collection Time: 05/31/17  5:21 PM  Result Value Ref Range Status   Specimen Description RIGHT ANTECUBITAL DRAWN BY RN  Final   Special Requests    Final    BOTTLES DRAWN AEROBIC AND ANAEROBIC Blood Culture adequate volume   Culture   Final    NO GROWTH 4 DAYS Performed at [facility], [address], [place], [place] [address]    Report Status PENDING  Incomplete  Urine culture     Status: Abnormal   Collection Time: 05/31/17  5:25 PM  Result Value Ref Range Status   Specimen Description   Final    URINE, RANDOM Performed at [facility], [address] [place], [place] [address]    Special Requests   Final    NONE Performed at [facility], [address] [place], [place] [address]    Culture >=100,000 COLONIES/mL ESCHERICHIA COLI (A)  Final   Report Status 06/03/2017 FINAL  Final   Organism ID, Bacteria ESCHERICHIA COLI (A)  Final      Susceptibility   Escherichia coli - MIC*    AMPICILLIN 8 SENSITIVE Sensitive     CEFAZOLIN <=4 SENSITIVE Sensitive     CEFTRIAXONE <=1 SENSITIVE Sensitive     CIPROFLOXACIN <=0.25 SENSITIVE Sensitive     GENTAMICIN <=1 SENSITIVE Sensitive     IMIPENEM <=0.25 SENSITIVE Sensitive     NITROFURANTOIN 32 SENSITIVE Sensitive     TRIMETH/SULFA <=20 SENSITIVE Sensitive     AMPICILLIN/SULBACTAM 4 SENSITIVE Sensitive     PIP/TAZO <=4 SENSITIVE Sensitive     Extended ESBL NEGATIVE Sensitive     * >=100,000 COLONIES/mL ESCHERICHIA COLI  Blood culture (routine x 2)     Status: None (Preliminary result)   Collection Time: 05/31/17  5:30 PM  Result Value Ref Range Status   Specimen Description LEFT ANTECUBITAL  Final   Special Requests   Final    BOTTLES DRAWN AEROBIC AND ANAEROBIC Blood Culture adequate volume   Culture   Final    NO GROWTH 4 DAYS Performed at [facility], [address], [place],  [address]    Report Status PENDING  Incomplete  MRSA PCR Screening     Status: None   Collection Time: 06/02/17  2:00 PM  Result Value Ref Range Status   MRSA by PCR NEGATIVE NEGATIVE Final    Comment:        The GeneXpert MRSA Assay (FDA approved for NASAL specimens only), is one  component of a comprehensive  MRSA colonization surveillance program. It is not intended to diagnose MRSA infection nor to guide or monitor treatment for MRSA infections. Performed at [facility], [address] [place], [place] [address]      Labs: Basic Metabolic Panel: Recent Labs  Lab 05/31/17 1710 05/31/17 1838 06/01/17 0459 06/02/17 0432 06/03/17 0533 06/04/17 0630  NA 138  --  139 142 142 140  K 3.2*  --  3.2* 3.0* 3.1* 3.6  CL 101  --  105 105 107 106  CO2 24  --  24 27 27 28   GLUCOSE 151*  --  264* 261* 277* 139*  BUN 15  --  15 10 7  5*  CREATININE 0.75  --  0.64 0.52  0.53 0.37* <0.30*  CALCIUM 11.4*  --  10.6* 10.7* 10.0 9.6  MG  --  2.4  --   --  1.8  --   PHOS  --  3.4  --   --   --   --    Liver Function Tests: Recent Labs  Lab 05/31/17 1710 06/01/17 0459 06/02/17 0432 06/03/17 0533 06/04/17 0630  AST 18 15 15  10* 12*  ALT 19 16 15  13* 14  ALKPHOS 82 67 69 64 56  BILITOT 1.3* 1.0 0.7 0.7 0.6  PROT 6.4* 5.5* 5.5* 5.2* 5.0*  ALBUMIN 3.1* 2.5* 2.5* 2.4* 2.4*   No results for input(s): LIPASE, AMYLASE in the last 168 hours. No results for input(s): AMMONIA in the last 168 hours. CBC: Recent Labs  Lab 05/31/17 1710 05/31/17 2329 06/03/17 0533  WBC 24.8* 21.7* 9.1  NEUTROABS 21.1*  --   --   HGB 13.8 12.7 10.8*  HCT 42.5 38.7 32.6*  MCV 95.5 95.1 93.9  PLT 301 282 268   Cardiac Enzymes: Recent Labs  Lab 05/31/17 2329 06/01/17 0459  TROPONINI 0.09* 0.09*   BNP: Invalid input(s): POCBNP CBG: Recent Labs  Lab 06/03/17 0727 06/03/17 1121 06/03/17 1608 06/03/17 2307 06/04/17 0733  GLUCAP 211* 223* 99 158* 121*    Time coordinating discharge:  36 minutes  Signed:  Orson Eva, DO [facility] Pager: [phone] 06/04/2017, 9:09 AM

## 2017-06-05 LAB — CULTURE, BLOOD (ROUTINE X 2)
CULTURE: NO GROWTH
Culture: NO GROWTH
SPECIAL REQUESTS: ADEQUATE
Special Requests: ADEQUATE

## 2017-06-06 LAB — PTH-RELATED PEPTIDE

## 2017-06-08 LAB — PTH-RELATED PEPTIDE

## 2017-06-10 DIAGNOSIS — N390 Urinary tract infection, site not specified: Secondary | ICD-10-CM | POA: Diagnosis not present

## 2017-06-10 DIAGNOSIS — L89152 Pressure ulcer of sacral region, stage 2: Secondary | ICD-10-CM | POA: Diagnosis not present

## 2017-06-10 DIAGNOSIS — D763 Other histiocytosis syndromes: Secondary | ICD-10-CM | POA: Diagnosis not present

## 2017-06-10 DIAGNOSIS — J690 Pneumonitis due to inhalation of food and vomit: Secondary | ICD-10-CM | POA: Diagnosis not present

## 2017-06-10 DIAGNOSIS — E43 Unspecified severe protein-calorie malnutrition: Secondary | ICD-10-CM | POA: Diagnosis not present

## 2017-06-10 DIAGNOSIS — G9341 Metabolic encephalopathy: Secondary | ICD-10-CM | POA: Diagnosis not present

## 2017-06-10 DIAGNOSIS — E119 Type 2 diabetes mellitus without complications: Secondary | ICD-10-CM | POA: Diagnosis not present

## 2017-06-10 DIAGNOSIS — B9620 Unspecified Escherichia coli [E. coli] as the cause of diseases classified elsewhere: Secondary | ICD-10-CM | POA: Diagnosis not present

## 2017-06-10 DIAGNOSIS — R1312 Dysphagia, oropharyngeal phase: Secondary | ICD-10-CM | POA: Diagnosis not present

## 2017-06-15 DIAGNOSIS — D763 Other histiocytosis syndromes: Secondary | ICD-10-CM | POA: Diagnosis not present

## 2017-06-15 DIAGNOSIS — E43 Unspecified severe protein-calorie malnutrition: Secondary | ICD-10-CM | POA: Diagnosis not present

## 2017-06-15 DIAGNOSIS — R1312 Dysphagia, oropharyngeal phase: Secondary | ICD-10-CM | POA: Diagnosis not present

## 2017-06-15 DIAGNOSIS — J690 Pneumonitis due to inhalation of food and vomit: Secondary | ICD-10-CM | POA: Diagnosis not present

## 2017-06-15 DIAGNOSIS — L89152 Pressure ulcer of sacral region, stage 2: Secondary | ICD-10-CM | POA: Diagnosis not present

## 2017-06-15 DIAGNOSIS — E119 Type 2 diabetes mellitus without complications: Secondary | ICD-10-CM | POA: Diagnosis not present

## 2017-06-15 DIAGNOSIS — G9341 Metabolic encephalopathy: Secondary | ICD-10-CM | POA: Diagnosis not present

## 2017-06-15 DIAGNOSIS — N390 Urinary tract infection, site not specified: Secondary | ICD-10-CM | POA: Diagnosis not present

## 2017-06-15 DIAGNOSIS — B9620 Unspecified Escherichia coli [E. coli] as the cause of diseases classified elsewhere: Secondary | ICD-10-CM | POA: Diagnosis not present

## 2017-06-17 DIAGNOSIS — L89152 Pressure ulcer of sacral region, stage 2: Secondary | ICD-10-CM | POA: Diagnosis not present

## 2017-06-17 DIAGNOSIS — N390 Urinary tract infection, site not specified: Secondary | ICD-10-CM | POA: Diagnosis not present

## 2017-06-17 DIAGNOSIS — E119 Type 2 diabetes mellitus without complications: Secondary | ICD-10-CM | POA: Diagnosis not present

## 2017-06-17 DIAGNOSIS — R1312 Dysphagia, oropharyngeal phase: Secondary | ICD-10-CM | POA: Diagnosis not present

## 2017-06-17 DIAGNOSIS — E43 Unspecified severe protein-calorie malnutrition: Secondary | ICD-10-CM | POA: Diagnosis not present

## 2017-06-17 DIAGNOSIS — B9620 Unspecified Escherichia coli [E. coli] as the cause of diseases classified elsewhere: Secondary | ICD-10-CM | POA: Diagnosis not present

## 2017-06-17 DIAGNOSIS — G9341 Metabolic encephalopathy: Secondary | ICD-10-CM | POA: Diagnosis not present

## 2017-06-17 DIAGNOSIS — D763 Other histiocytosis syndromes: Secondary | ICD-10-CM | POA: Diagnosis not present

## 2017-06-17 DIAGNOSIS — J690 Pneumonitis due to inhalation of food and vomit: Secondary | ICD-10-CM | POA: Diagnosis not present

## 2017-06-18 DIAGNOSIS — N390 Urinary tract infection, site not specified: Secondary | ICD-10-CM | POA: Diagnosis not present

## 2017-06-18 DIAGNOSIS — R1312 Dysphagia, oropharyngeal phase: Secondary | ICD-10-CM | POA: Diagnosis not present

## 2017-06-18 DIAGNOSIS — G9341 Metabolic encephalopathy: Secondary | ICD-10-CM | POA: Diagnosis not present

## 2017-06-18 DIAGNOSIS — D763 Other histiocytosis syndromes: Secondary | ICD-10-CM | POA: Diagnosis not present

## 2017-06-18 DIAGNOSIS — E119 Type 2 diabetes mellitus without complications: Secondary | ICD-10-CM | POA: Diagnosis not present

## 2017-06-18 DIAGNOSIS — E43 Unspecified severe protein-calorie malnutrition: Secondary | ICD-10-CM | POA: Diagnosis not present

## 2017-06-18 DIAGNOSIS — B9620 Unspecified Escherichia coli [E. coli] as the cause of diseases classified elsewhere: Secondary | ICD-10-CM | POA: Diagnosis not present

## 2017-06-18 DIAGNOSIS — L89152 Pressure ulcer of sacral region, stage 2: Secondary | ICD-10-CM | POA: Diagnosis not present

## 2017-06-18 DIAGNOSIS — J690 Pneumonitis due to inhalation of food and vomit: Secondary | ICD-10-CM | POA: Diagnosis not present

## 2017-06-22 DIAGNOSIS — E119 Type 2 diabetes mellitus without complications: Secondary | ICD-10-CM | POA: Diagnosis not present

## 2017-06-22 DIAGNOSIS — L89152 Pressure ulcer of sacral region, stage 2: Secondary | ICD-10-CM | POA: Diagnosis not present

## 2017-06-22 DIAGNOSIS — G9341 Metabolic encephalopathy: Secondary | ICD-10-CM | POA: Diagnosis not present

## 2017-06-22 DIAGNOSIS — R1312 Dysphagia, oropharyngeal phase: Secondary | ICD-10-CM | POA: Diagnosis not present

## 2017-06-22 DIAGNOSIS — D763 Other histiocytosis syndromes: Secondary | ICD-10-CM | POA: Diagnosis not present

## 2017-06-22 DIAGNOSIS — N390 Urinary tract infection, site not specified: Secondary | ICD-10-CM | POA: Diagnosis not present

## 2017-06-22 DIAGNOSIS — B9620 Unspecified Escherichia coli [E. coli] as the cause of diseases classified elsewhere: Secondary | ICD-10-CM | POA: Diagnosis not present

## 2017-06-22 DIAGNOSIS — E43 Unspecified severe protein-calorie malnutrition: Secondary | ICD-10-CM | POA: Diagnosis not present

## 2017-06-22 DIAGNOSIS — J690 Pneumonitis due to inhalation of food and vomit: Secondary | ICD-10-CM | POA: Diagnosis not present

## 2017-06-25 DIAGNOSIS — G9341 Metabolic encephalopathy: Secondary | ICD-10-CM | POA: Diagnosis not present

## 2017-06-25 DIAGNOSIS — N390 Urinary tract infection, site not specified: Secondary | ICD-10-CM | POA: Diagnosis not present

## 2017-06-25 DIAGNOSIS — J690 Pneumonitis due to inhalation of food and vomit: Secondary | ICD-10-CM | POA: Diagnosis not present

## 2017-06-25 DIAGNOSIS — E43 Unspecified severe protein-calorie malnutrition: Secondary | ICD-10-CM | POA: Diagnosis not present

## 2017-06-25 DIAGNOSIS — D763 Other histiocytosis syndromes: Secondary | ICD-10-CM | POA: Diagnosis not present

## 2017-06-25 DIAGNOSIS — L89152 Pressure ulcer of sacral region, stage 2: Secondary | ICD-10-CM | POA: Diagnosis not present

## 2017-06-25 DIAGNOSIS — B9620 Unspecified Escherichia coli [E. coli] as the cause of diseases classified elsewhere: Secondary | ICD-10-CM | POA: Diagnosis not present

## 2017-06-25 DIAGNOSIS — R1312 Dysphagia, oropharyngeal phase: Secondary | ICD-10-CM | POA: Diagnosis not present

## 2017-06-25 DIAGNOSIS — E119 Type 2 diabetes mellitus without complications: Secondary | ICD-10-CM | POA: Diagnosis not present

## 2017-06-29 DIAGNOSIS — E43 Unspecified severe protein-calorie malnutrition: Secondary | ICD-10-CM | POA: Diagnosis not present

## 2017-06-29 DIAGNOSIS — D763 Other histiocytosis syndromes: Secondary | ICD-10-CM | POA: Diagnosis not present

## 2017-06-29 DIAGNOSIS — G9341 Metabolic encephalopathy: Secondary | ICD-10-CM | POA: Diagnosis not present

## 2017-06-29 DIAGNOSIS — B9620 Unspecified Escherichia coli [E. coli] as the cause of diseases classified elsewhere: Secondary | ICD-10-CM | POA: Diagnosis not present

## 2017-06-29 DIAGNOSIS — R1312 Dysphagia, oropharyngeal phase: Secondary | ICD-10-CM | POA: Diagnosis not present

## 2017-06-29 DIAGNOSIS — N390 Urinary tract infection, site not specified: Secondary | ICD-10-CM | POA: Diagnosis not present

## 2017-06-29 DIAGNOSIS — L89152 Pressure ulcer of sacral region, stage 2: Secondary | ICD-10-CM | POA: Diagnosis not present

## 2017-06-29 DIAGNOSIS — J690 Pneumonitis due to inhalation of food and vomit: Secondary | ICD-10-CM | POA: Diagnosis not present

## 2017-06-29 DIAGNOSIS — E119 Type 2 diabetes mellitus without complications: Secondary | ICD-10-CM | POA: Diagnosis not present

## 2017-07-01 ENCOUNTER — Encounter (HOSPITAL_COMMUNITY): Payer: Self-pay | Admitting: Psychiatry

## 2017-07-01 ENCOUNTER — Ambulatory Visit (HOSPITAL_COMMUNITY): Payer: Medicare HMO | Admitting: Psychiatry

## 2017-07-01 VITALS — BP 138/85 | HR 67 | Ht 69.0 in

## 2017-07-01 DIAGNOSIS — G9341 Metabolic encephalopathy: Secondary | ICD-10-CM | POA: Diagnosis not present

## 2017-07-01 DIAGNOSIS — E43 Unspecified severe protein-calorie malnutrition: Secondary | ICD-10-CM | POA: Diagnosis not present

## 2017-07-01 DIAGNOSIS — N390 Urinary tract infection, site not specified: Secondary | ICD-10-CM | POA: Diagnosis not present

## 2017-07-01 DIAGNOSIS — R1312 Dysphagia, oropharyngeal phase: Secondary | ICD-10-CM | POA: Diagnosis not present

## 2017-07-01 DIAGNOSIS — J690 Pneumonitis due to inhalation of food and vomit: Secondary | ICD-10-CM | POA: Diagnosis not present

## 2017-07-01 DIAGNOSIS — F200 Paranoid schizophrenia: Secondary | ICD-10-CM | POA: Diagnosis not present

## 2017-07-01 DIAGNOSIS — Z993 Dependence on wheelchair: Secondary | ICD-10-CM

## 2017-07-01 DIAGNOSIS — L89152 Pressure ulcer of sacral region, stage 2: Secondary | ICD-10-CM | POA: Diagnosis not present

## 2017-07-01 DIAGNOSIS — D763 Other histiocytosis syndromes: Secondary | ICD-10-CM

## 2017-07-01 DIAGNOSIS — F329 Major depressive disorder, single episode, unspecified: Secondary | ICD-10-CM | POA: Diagnosis not present

## 2017-07-01 DIAGNOSIS — R451 Restlessness and agitation: Secondary | ICD-10-CM

## 2017-07-01 DIAGNOSIS — B9620 Unspecified Escherichia coli [E. coli] as the cause of diseases classified elsewhere: Secondary | ICD-10-CM | POA: Diagnosis not present

## 2017-07-01 DIAGNOSIS — E119 Type 2 diabetes mellitus without complications: Secondary | ICD-10-CM | POA: Diagnosis not present

## 2017-07-01 MED ORDER — TRAZODONE HCL 50 MG PO TABS: 50.0000 mg | ORAL_TABLET | Freq: Every day | ORAL | 2 refills | Status: DC

## 2017-07-01 MED ORDER — PAROXETINE HCL 40 MG PO TABS: 40.0000 mg | ORAL_TABLET | Freq: Every day | ORAL | 2 refills | Status: DC

## 2017-07-01 MED ORDER — RISPERIDONE 2 MG PO TABS: 2.0000 mg | ORAL_TABLET | Freq: Every day | ORAL | 2 refills | Status: DC

## 2017-07-01 MED ORDER — ALPRAZOLAM 0.25 MG PO TABS: 0.2500 mg | ORAL_TABLET | Freq: Three times a day (TID) | ORAL | 2 refills | Status: DC | PRN

## 2017-07-01 NOTE — Progress Notes (Signed)
BH MD/PA/NP OP Progress Note  07/01/2017 10:45 AM Rachel [name]  MRN:  [id number]  Chief Complaint:  Chief Complaint    Schizophrenia; Follow-up     HPI: This patient is a 67 year old married black female who lives with her husband in [place].  She is on disability.  The patient returns after 6 months.  She is followed here for diagnosis of schizophrenia but she also has Renee Dorfman disease.  Since I last saw her she was hospitalized for almost a month in March.  She had to have some brain tissue resected-left-sided extradural masses with vasogenic edema.  She had a micro-dissection on 04/23/2017.  Apparently she was difficult to extubate and it was touch and go to determine if she would even make at home again.  For a while she was only able to eat thick liquids.  She had to be rehospitalized again last month because of encephalopathy which was possibly due to aspiration pneumonia her husband has elected not to put her in a nursing home but is caring for her himself.  She is wheelchair-bound and she needs total care with feeding and all other ADLs including toileting.  He does have some assistance from a home health nurse.  His children come in on weekends.  The patient is wheelchair-bound today with her head down and voices absolutely nothing.  She makes no eye contact.  Husband states that she speaks several words phrases at times.  He states that the Xanax 0.25 mg is a bit too strong and he breaks it into little pieces and gives her 1 of these about twice a day when she gets agitated.  She is no longer on temazepam because it made her too drowsy but she still takes trazodone at night.  Without it she does not sleep well and gets agitated.  She continues on Paxil and Risperdal and has not voiced any paranoid delusions and does not seem to be hallucinating. Visit Diagnosis:    ICD-10-CM   1. Paranoid schizophrenia, chronic condition ([place]) F20.0     Past Psychiatric History: Long-term  treatment for schizophrenia  Past Medical History:  Past Medical History:  Diagnosis Date  . Blindness   . Diabetes mellitus type I ([place])   . GERD (gastroesophageal reflux disease)   . Hyperlipidemia   . Paroxysmal atrial fibrillation ([facility])   . Schizophrenia ([place])   . Sleep apnea   . Subarachnoid hemorrhage ([place])   . Systemic hypertension   . Vertigo     Past Surgical History:  Procedure Laterality Date  . BRAIN TUMOR EXCISION    . CRANIOTOMY Left 04/23/2017   Procedure: CRANIOTOMY TUMOR EXCISION;  Surgeon: Earnie Larsson, MD;  Location: [facility];  Service: Neurosurgery;  Laterality: Left;  left  . NM MYOCAR PERF WALL MOTION  02/01/2007   no significant ischemia  . US ECHOCARDIOGRAPHY  12/20/2003   mild mitral annular ca+,mild MR,TR,PI,AOV mildly sclerotic    Family Psychiatric History: None  Family History:  Family History  Problem Relation Age of Onset  . ADD / ADHD Neg Hx   . Alcohol abuse Neg Hx   . Drug abuse Neg Hx   . Anxiety disorder Neg Hx   . Bipolar disorder Neg Hx   . Dementia Neg Hx   . Depression Neg Hx   . OCD Neg Hx   . Paranoid behavior Neg Hx   . Schizophrenia Neg Hx   . Seizures Neg Hx   . Sexual abuse Neg Hx   .  Physical abuse Neg Hx     Social History:  Social History   Socioeconomic History  . Marital status: Married    Spouse name: Not on file  . Number of children: Not on file  . Years of education: Not on file  . Highest education level: Not on file  Occupational History  . Not on file  Social Needs  . Financial resource strain: Not on file  . Food insecurity:    Worry: Not on file    Inability: Not on file  . Transportation needs:    Medical: Not on file    Non-medical: Not on file  Tobacco Use  . Smoking status: Never Smoker  . Smokeless tobacco: Never Used  Substance and Sexual Activity  . Alcohol use: No  . Drug use: No  . Sexual activity: Not on file  Lifestyle  . Physical activity:    Days per week: Not on file     Minutes per session: Not on file  . Stress: Not on file  Relationships  . Social connections:    Talks on phone: Not on file    Gets together: Not on file    Attends religious service: Not on file    Active member of club or organization: Not on file    Attends meetings of clubs or organizations: Not on file    Relationship status: Not on file  Other Topics Concern  . Not on file  Social History Narrative   Living home with husband, [name].  Wheelchair bound, Husband walks her around house.  Children Grown 2.  Caffiene- rare.      Allergies:  Allergies  Allergen Reactions  . Sulfa Antibiotics Anaphylaxis  . Ibuprofen Other (See Comments)    unknown  . Shellfish Allergy Swelling    Mouth    Metabolic Disorder Labs: Lab Results  Component Value Date   HGBA1C 7.4 (H) 04/20/2017   MPG 165.68 04/20/2017   MPG 180 09/19/2015   No results found for: PROLACTIN Lab Results  Component Value Date   CHOL 127 09/11/2013   TRIG 45 09/11/2013   HDL 59 09/11/2013   CHOLHDL 2.2 09/11/2013   VLDL 9 09/11/2013   LDLCALC 59 09/11/2013   Lab Results  Component Value Date   TSH 1.017 04/30/2017   TSH 0.529 04/27/2017    Therapeutic Level Labs: No results found for: LITHIUM No results found for: VALPROATE No components found for:  CBMZ  Current Medications: Current Outpatient Medications  Medication Sig Dispense Refill  . acetaminophen (TYLENOL) 500 MG tablet Take 1,000 mg by mouth every 6 (six) hours as needed for mild pain.    Marland Kitchen albuterol (PROVENTIL) (2.5 MG/3ML) 0.083% nebulizer solution Take 3 mLs (2.5 mg total) by nebulization every 4 (four) hours as needed for wheezing or shortness of breath. 75 mL 12  . ALPRAZolam (XANAX) 0.25 MG tablet Take 1 tablet (0.25 mg total) by mouth 3 (three) times daily as needed. for anxiety 90 tablet 2  . amoxicillin-clavulanate (AUGMENTIN) 875-125 MG tablet Take 1 tablet by mouth 2 (two) times daily. 7 tablet 0  . bisacodyl (DULCOLAX) 5 MG EC  tablet Take 5 mg by mouth daily.    Marland Kitchen diltiazem (CARDIZEM CD) 180 MG 24 hr capsule Take 1 capsule (180 mg total) by mouth daily. 90 capsule 3  . famotidine (PEPCID) 20 MG tablet Take 20 mg by mouth daily.     . insulin NPH Human (HUMULIN N,NOVOLIN N) 100 UNIT/ML injection Inject  0.18 mLs (18 Units total) into the skin 2 (two) times daily at 8 am and 10 pm. 10 mL 11  . levETIRAcetam (KEPPRA) 500 MG tablet Take 1 tablet (500 mg total) by mouth 2 (two) times daily. 60 tablet 0  . loratadine (CLARITIN) 10 MG tablet Take 10 mg by mouth daily as needed for allergies.   1  . Multiple Vitamin (MULTIVITAMIN WITH MINERALS) TABS tablet Take 1 tablet by mouth daily.    . ondansetron (ZOFRAN) 4 MG tablet Take 1 tablet (4 mg total) by mouth every 6 (six) hours as needed for nausea. 20 tablet 0  . pantoprazole (PROTONIX) 40 MG tablet Take 1 tablet (40 mg total) by mouth daily. 30 tablet 1  . PARoxetine (PAXIL) 40 MG tablet Take 1 tablet (40 mg total) by mouth at bedtime. 90 tablet 2  . polyethylene glycol (MIRALAX / GLYCOLAX) packet Place 17 g into feeding tube daily. 14 each 0  . risperiDONE (RISPERDAL) 2 MG tablet Take 1 tablet (2 mg total) by mouth at bedtime. 90 tablet 2  . rosuvastatin (CRESTOR) 10 MG tablet Take 1 tablet (10 mg total) by mouth every evening. 90 tablet 3  . traZODone (DESYREL) 50 MG tablet Take 1 tablet (50 mg total) by mouth at bedtime. 90 tablet 2   Current Facility-Administered Medications  Medication Dose Route Frequency Provider Last Rate Last Dose  . aspirin EC tablet 81 mg  81 mg Oral Daily Garvin Fila, MD         Musculoskeletal: Strength & Muscle Tone: decreased and atrophy Gait & Station: unable to stand Patient leans: N/A  Psychiatric Specialty Exam: Review of Systems  Unable to perform ROS: Patient nonverbal    Blood pressure 138/85, pulse 67, height 5\' 9"  (1.753 m), SpO2 100 %.Body mass index is 16.51 kg/m.  General Appearance: Fairly Groomed very thin sitting  in a wheelchair with head down.  She is clean and neatly dressed  Eye Contact:  None  Speech:  None  Volume:  None  Mood:  NA  Affect:  Flat  Thought Process:  NA  Orientation:  NA  Thought Content: NA   Suicidal Thoughts:  No  Homicidal Thoughts:  No  Memory:  NA  Judgement:  Impaired  Insight:  Lacking  Psychomotor Activity:  Decreased  Concentration:  Concentration: NA and Attention Span: NA  Recall:  NA  Fund of Knowledge: Poor  Language: NA  Akathisia:  No  Handed:  Right  AIMS (if indicated): not done  Assets:  Resilience Social Support  ADL's:  Impaired  Cognition: Impaired,  Severe  Sleep:  Fair   Screenings:   Assessment and Plan: Patient is a 67 year old female with a history of schizophrenia and Renee Dorfman syndrome.  She has had 2 recent hospitalizations and probably needs total care in the assisted living facility but her insurance denied this.  Her husband is doing his best taking care of her at home.  He seems positive and tearful about his role.  The patient herself was totally noncommunicative today.  The medications however seem to be holding her at baseline so she will continue Xanax 0.25 or less twice a day as needed for agitation, Risperdal 2 mg at bedtime for schizophrenia, Paxil 40 mg daily for depression and trazodone 50 mg at bedtime for sleep.  The patient will return to see me in 6 months or call sooner if needed   Levonne Spiller, MD 07/01/2017, 10:45 AM

## 2017-07-07 DIAGNOSIS — R1312 Dysphagia, oropharyngeal phase: Secondary | ICD-10-CM | POA: Diagnosis not present

## 2017-07-07 DIAGNOSIS — N390 Urinary tract infection, site not specified: Secondary | ICD-10-CM | POA: Diagnosis not present

## 2017-07-07 DIAGNOSIS — B9620 Unspecified Escherichia coli [E. coli] as the cause of diseases classified elsewhere: Secondary | ICD-10-CM | POA: Diagnosis not present

## 2017-07-07 DIAGNOSIS — J690 Pneumonitis due to inhalation of food and vomit: Secondary | ICD-10-CM | POA: Diagnosis not present

## 2017-07-07 DIAGNOSIS — G9341 Metabolic encephalopathy: Secondary | ICD-10-CM | POA: Diagnosis not present

## 2017-07-07 DIAGNOSIS — E43 Unspecified severe protein-calorie malnutrition: Secondary | ICD-10-CM | POA: Diagnosis not present

## 2017-07-07 DIAGNOSIS — L89152 Pressure ulcer of sacral region, stage 2: Secondary | ICD-10-CM | POA: Diagnosis not present

## 2017-07-07 DIAGNOSIS — E119 Type 2 diabetes mellitus without complications: Secondary | ICD-10-CM | POA: Diagnosis not present

## 2017-07-07 DIAGNOSIS — D763 Other histiocytosis syndromes: Secondary | ICD-10-CM | POA: Diagnosis not present

## 2017-07-15 DIAGNOSIS — E119 Type 2 diabetes mellitus without complications: Secondary | ICD-10-CM | POA: Diagnosis not present

## 2017-07-15 DIAGNOSIS — N390 Urinary tract infection, site not specified: Secondary | ICD-10-CM | POA: Diagnosis not present

## 2017-07-15 DIAGNOSIS — G9341 Metabolic encephalopathy: Secondary | ICD-10-CM | POA: Diagnosis not present

## 2017-07-15 DIAGNOSIS — B9620 Unspecified Escherichia coli [E. coli] as the cause of diseases classified elsewhere: Secondary | ICD-10-CM | POA: Diagnosis not present

## 2017-07-15 DIAGNOSIS — R1312 Dysphagia, oropharyngeal phase: Secondary | ICD-10-CM | POA: Diagnosis not present

## 2017-07-15 DIAGNOSIS — J690 Pneumonitis due to inhalation of food and vomit: Secondary | ICD-10-CM | POA: Diagnosis not present

## 2017-07-15 DIAGNOSIS — E43 Unspecified severe protein-calorie malnutrition: Secondary | ICD-10-CM | POA: Diagnosis not present

## 2017-07-15 DIAGNOSIS — L89152 Pressure ulcer of sacral region, stage 2: Secondary | ICD-10-CM | POA: Diagnosis not present

## 2017-07-15 DIAGNOSIS — D763 Other histiocytosis syndromes: Secondary | ICD-10-CM | POA: Diagnosis not present

## 2017-08-24 DIAGNOSIS — F209 Schizophrenia, unspecified: Secondary | ICD-10-CM | POA: Diagnosis not present

## 2017-08-24 DIAGNOSIS — E11649 Type 2 diabetes mellitus with hypoglycemia without coma: Secondary | ICD-10-CM | POA: Diagnosis not present

## 2017-08-24 DIAGNOSIS — I10 Essential (primary) hypertension: Secondary | ICD-10-CM | POA: Diagnosis not present

## 2017-11-23 DIAGNOSIS — Z794 Long term (current) use of insulin: Secondary | ICD-10-CM | POA: Diagnosis not present

## 2017-11-23 DIAGNOSIS — I10 Essential (primary) hypertension: Secondary | ICD-10-CM | POA: Diagnosis not present

## 2017-11-23 DIAGNOSIS — F209 Schizophrenia, unspecified: Secondary | ICD-10-CM | POA: Diagnosis not present

## 2017-11-23 DIAGNOSIS — D329 Benign neoplasm of meninges, unspecified: Secondary | ICD-10-CM | POA: Diagnosis not present

## 2017-11-23 DIAGNOSIS — E118 Type 2 diabetes mellitus with unspecified complications: Secondary | ICD-10-CM | POA: Diagnosis not present

## 2017-12-02 DIAGNOSIS — R799 Abnormal finding of blood chemistry, unspecified: Secondary | ICD-10-CM | POA: Diagnosis not present

## 2017-12-23 ENCOUNTER — Encounter: Payer: Self-pay | Admitting: *Deleted

## 2018-01-03 ENCOUNTER — Encounter (HOSPITAL_COMMUNITY): Payer: Self-pay | Admitting: Psychiatry

## 2018-01-03 ENCOUNTER — Ambulatory Visit (HOSPITAL_COMMUNITY): Payer: Medicare HMO | Admitting: Psychiatry

## 2018-01-03 VITALS — BP 152/94 | HR 81 | Ht 69.0 in

## 2018-01-03 DIAGNOSIS — F200 Paranoid schizophrenia: Secondary | ICD-10-CM | POA: Diagnosis not present

## 2018-01-03 IMAGING — CT CT HEAD W/O CM
3 of 4 series · 15 of 47 positions shown, 18 images · non-contrast
Comparison: Brain MRI and noncontrast head CT 12/04/2013, and
earlier

CLINICAL DATA: 65-year-old female with frequent headaches for 6
months. Initial encounter. Personal history of Rosai-Dorfman
Disease.

EXAM:
CT HEAD WITHOUT CONTRAST
TECHNIQUE: Contiguous axial images were obtained from the base of the skull
through the vertex without intravenous contrast.

[Series 2: head w/o · axial · non-contrast · 0.44mm/px · z∈[+116,+256]mm · 9 of 41 slices shown, 12 images]
[im 3/41  brain]
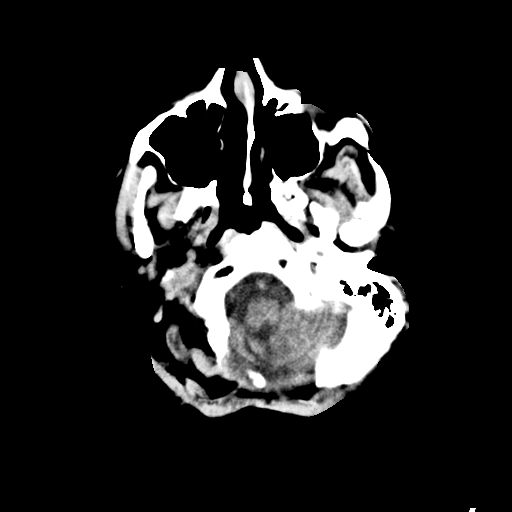
[im 3/41  bone]
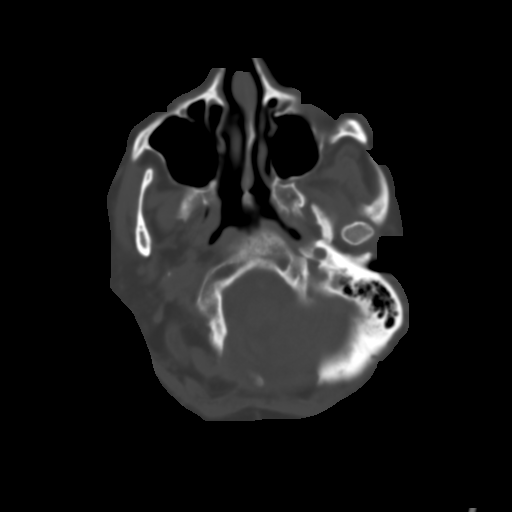
[im 9/41  brain]
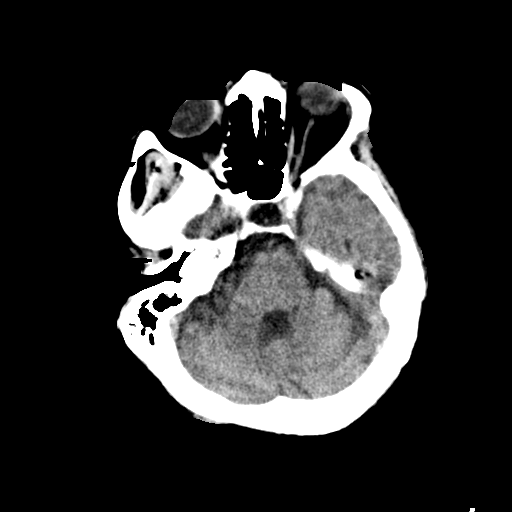
[im 12/41  brain]
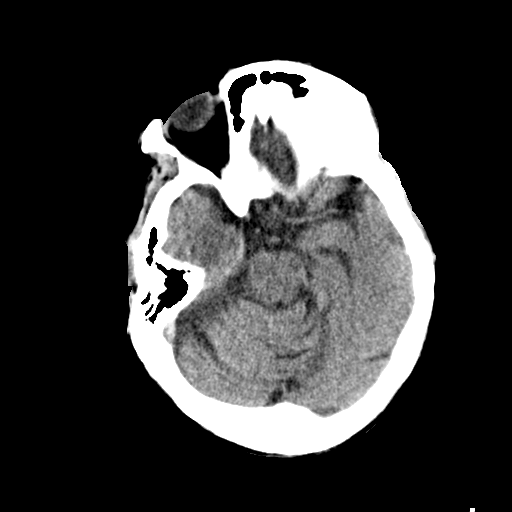
[im 18/41  brain]
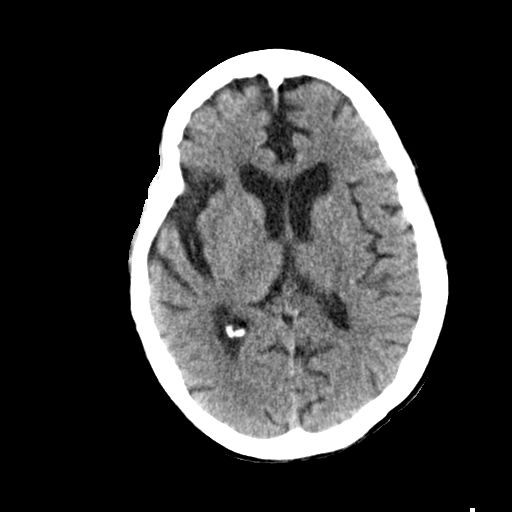
[im 21/41  brain]
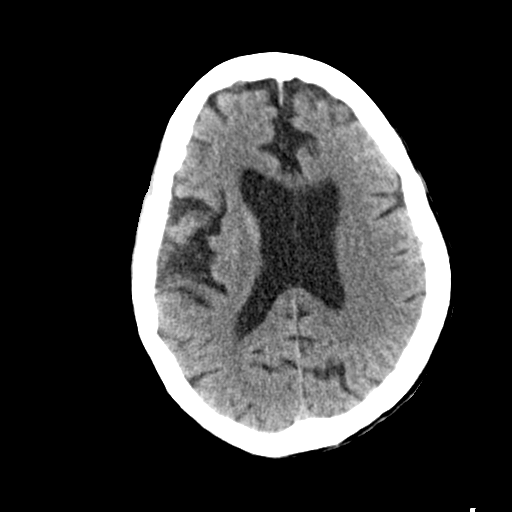
[im 21/41  bone]
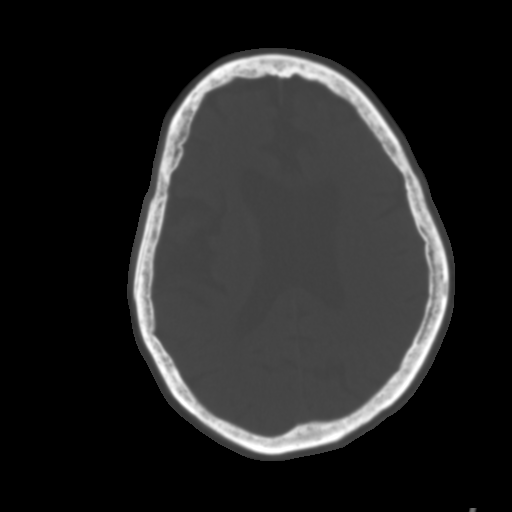
[im 23/41  brain]
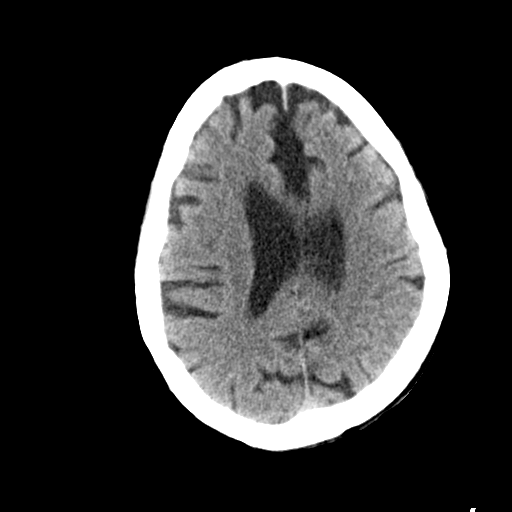
[im 29/41  brain]
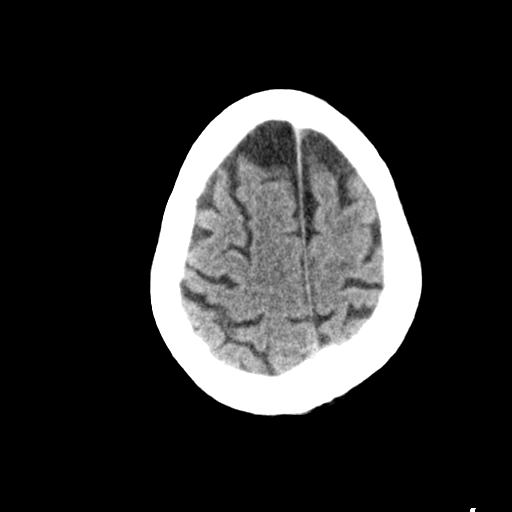
[im 32/41  brain]
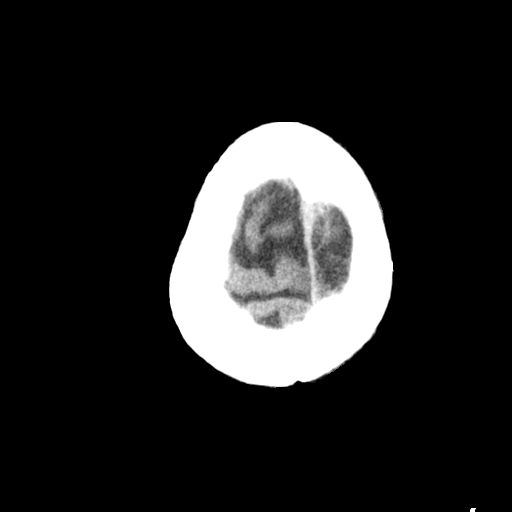
[im 38/41  brain]
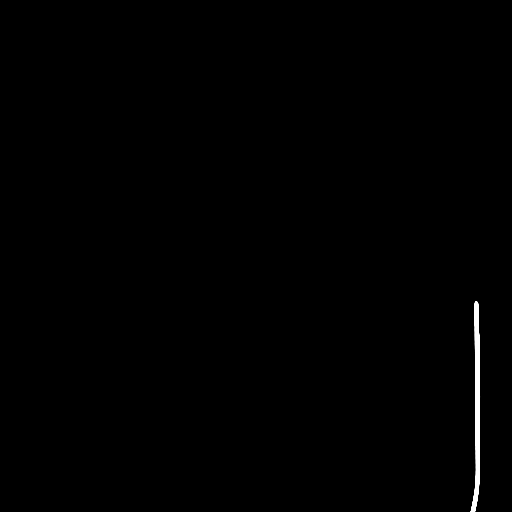
[im 38/41  bone]
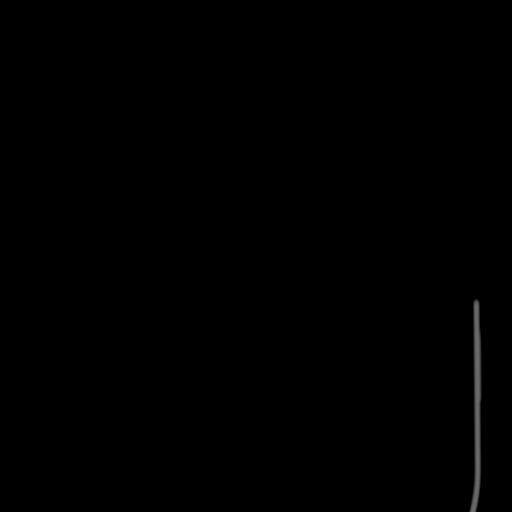

[Series 4: coronal · coronal · 0.32mm/px · 3 of 66 slices shown]
[im 22/66  brain]
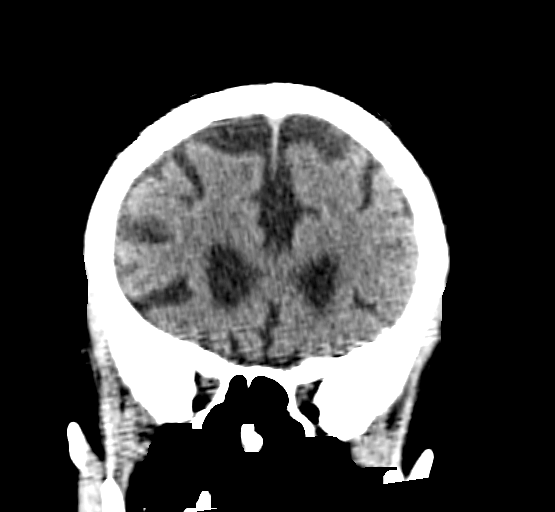
[im 29/66  brain]
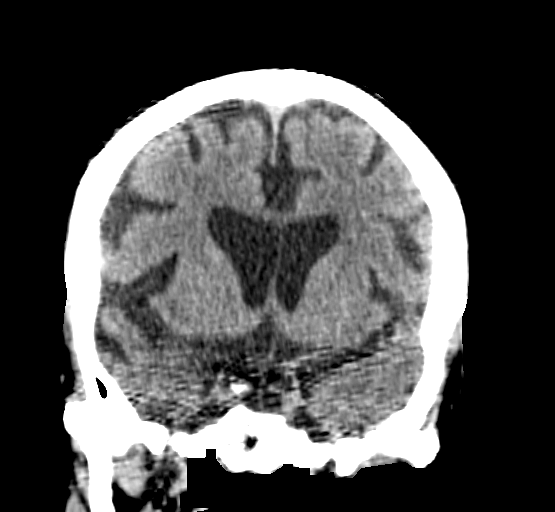
[im 37/66  brain]
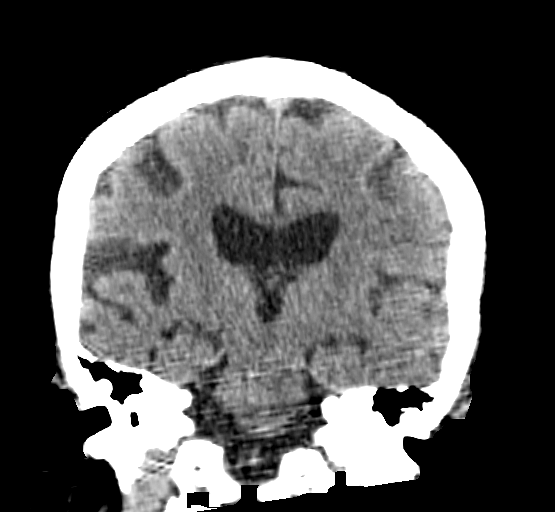

[Series 5: sagittal · sagittal · 0.32mm/px · 3 of 54 slices shown]
[im 20/54  brain]
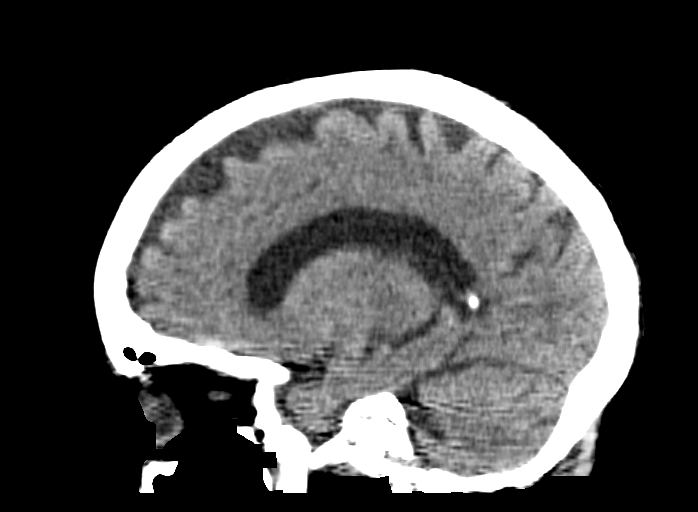
[im 27/54  brain]
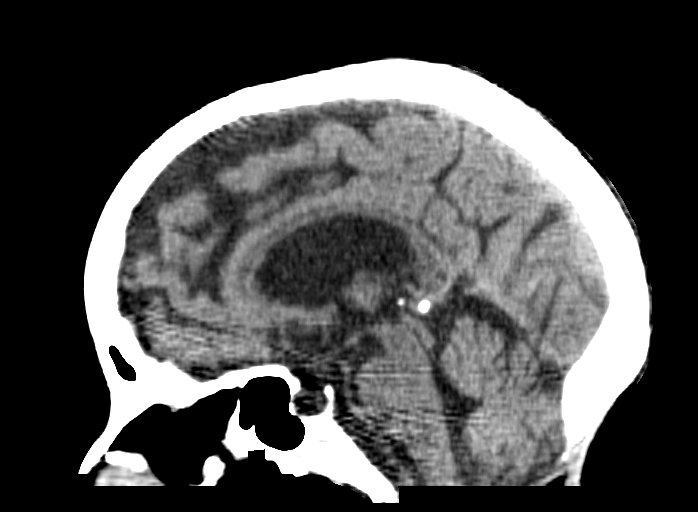
[im 34/54  brain]
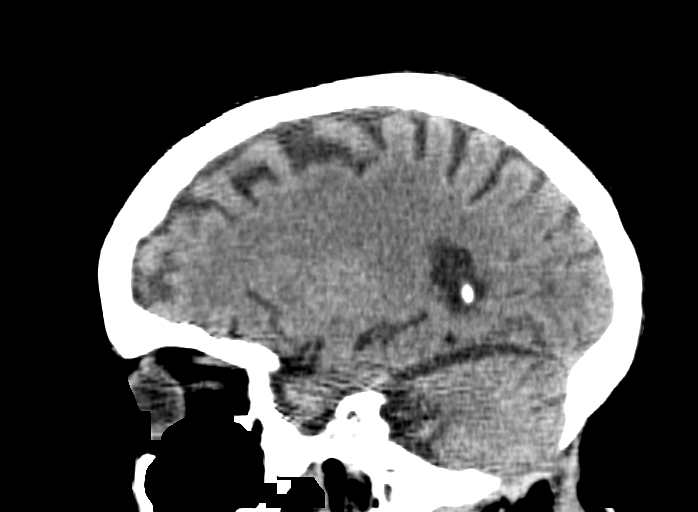

[15 of 47 positions shown; findings below may reference images not displayed]

FINDINGS: Stable and well pneumatized visualized paranasal sinuses and
mastoids. No acute osseous abnormality identified. Previous
suboccipital craniectomy. Mildly heterogeneous bone mineralization
is stable with no destructive osseous lesion identified.

Visualized orbits and scalp soft tissues are within normal limits.

Cerebral volume is not significantly changed since 1034. No
ventriculomegaly. Stable basilar cisterns. No midline shift or
significant intracranial mass effect. No acute intracranial
hemorrhage identified. No cortically based acute infarct identified.
No suspicious intracranial vascular hyperdensity. Chronic left
middle cranial fossa enhancing extra-axial lesion is subtle on
noncontrast CT (series 2, image 9) an grossly stable. Partially
empty sella re- demonstrated.
IMPRESSION: Stable noncontrast CT appearance of the brain since 1034 with no
acute abnormality identified.

## 2018-01-03 MED ORDER — PAROXETINE HCL 40 MG PO TABS: 40.0000 mg | ORAL_TABLET | Freq: Every day | ORAL | 2 refills | Status: DC

## 2018-01-03 MED ORDER — TRAZODONE HCL 50 MG PO TABS: 50.0000 mg | ORAL_TABLET | Freq: Every day | ORAL | 2 refills | Status: AC

## 2018-01-03 MED ORDER — RISPERIDONE 2 MG PO TABS: 2.0000 mg | ORAL_TABLET | Freq: Every day | ORAL | 2 refills | Status: AC

## 2018-01-03 MED ORDER — ALPRAZOLAM 0.25 MG PO TABS: 0.2500 mg | ORAL_TABLET | Freq: Three times a day (TID) | ORAL | 2 refills | Status: AC | PRN

## 2018-01-03 MED ORDER — RISPERIDONE 2 MG PO TABS: 2.0000 mg | ORAL_TABLET | Freq: Every day | ORAL | 2 refills | Status: DC

## 2018-01-03 MED ORDER — ALPRAZOLAM 0.25 MG PO TABS: 0.2500 mg | ORAL_TABLET | Freq: Three times a day (TID) | ORAL | 2 refills | Status: DC | PRN

## 2018-01-03 MED ORDER — TRAZODONE HCL 50 MG PO TABS: 50.0000 mg | ORAL_TABLET | Freq: Every day | ORAL | 2 refills | Status: DC

## 2018-01-03 MED ORDER — PAROXETINE HCL 40 MG PO TABS: 40.0000 mg | ORAL_TABLET | Freq: Every day | ORAL | 2 refills | Status: AC

## 2018-01-03 NOTE — Progress Notes (Signed)
BH MD/PA/NP OP Progress Note  01/03/2018 10:22 AM Rachel [name]  MRN:  [id number]  Chief Complaint:  Chief Complaint    Schizophrenia; Follow-up     HPI: This patient is a 67 year old married black female who lives with her husband in [place].  She is on disability.  The patient returns after 6 months.  She is followed here for diagnosis of schizophrenia but she also has Renee Dorfman disease.   she was hospitalized for almost a month in March.  She had to have some brain tissue resected-left-sided extradural masses with vasogenic edema.  She had a micro-dissection on 04/23/2017.  Apparently she was difficult to extubate and it was touch and go to determine if she would even make at home again.  For a while she was only able to eat thick liquids.  She had to be rehospitalized again l because of encephalopathy which was possibly due to aspiration pneumonia her husband has elected not to put her in a nursing home but is caring for her himself.  She is wheelchair-bound and she needs total care with feeding and all other ADLs including toileting.  He does have some assistance from a home health nurse.  His children come in on weekends.  The patient returns with her husband after 6 months.  Nothing much is changed.  She is totally wheelchair-bound.  She appears cachectic and has her head down.  She only can say hello.  Husband states that she does very little but sit in a chair all day.  He is totally caring for her and refuses any mention of getting home health assistance.  He uses a Xanax but takes a little piece of it and gives it to her when he thinks she is becoming a little agitated.  She is sleeping with the trazodone.  She has not voiced any paranoid thoughts and does not voice any thoughts of suicide.  She is totally mentally disabled however and is difficult to tell.  He really does not want any other medications change at this point. Visit Diagnosis:    ICD-10-CM   1. Paranoid  schizophrenia, chronic condition ([place]) F20.0     Past Psychiatric History: Long term treatment for schizophrenia  Past Medical History:  Past Medical History:  Diagnosis Date  . Blindness   . Diabetes mellitus type I ([place])   . GERD (gastroesophageal reflux disease)   . Hyperlipidemia   . Paroxysmal atrial fibrillation ([facility])   . Schizophrenia ([place])   . Sleep apnea   . Subarachnoid hemorrhage ([place])   . Systemic hypertension   . Vertigo     Past Surgical History:  Procedure Laterality Date  . BRAIN TUMOR EXCISION    . CRANIOTOMY Left 04/23/2017   Procedure: CRANIOTOMY TUMOR EXCISION;  Surgeon: Earnie Larsson, MD;  Location: [facility];  Service: Neurosurgery;  Laterality: Left;  left  . NM MYOCAR PERF WALL MOTION  02/01/2007   no significant ischemia  . US ECHOCARDIOGRAPHY  12/20/2003   mild mitral annular ca+,mild MR,TR,PI,AOV mildly sclerotic    Family Psychiatric History: see below  Family History:  Family History  Problem Relation Age of Onset  . ADD / ADHD Neg Hx   . Alcohol abuse Neg Hx   . Drug abuse Neg Hx   . Anxiety disorder Neg Hx   . Bipolar disorder Neg Hx   . Dementia Neg Hx   . Depression Neg Hx   . OCD Neg Hx   . Paranoid behavior Neg  Hx   . Schizophrenia Neg Hx   . Seizures Neg Hx   . Sexual abuse Neg Hx   . Physical abuse Neg Hx     Social History:  Social History   Socioeconomic History  . Marital status: Married    Spouse name: Not on file  . Number of children: Not on file  . Years of education: Not on file  . Highest education level: Not on file  Occupational History  . Not on file  Social Needs  . Financial resource strain: Not on file  . Food insecurity:    Worry: Not on file    Inability: Not on file  . Transportation needs:    Medical: Not on file    Non-medical: Not on file  Tobacco Use  . Smoking status: Never Smoker  . Smokeless tobacco: Never Used  Substance and Sexual Activity  . Alcohol use: No  . Drug use: No  . Sexual  activity: Not on file  Lifestyle  . Physical activity:    Days per week: Not on file    Minutes per session: Not on file  . Stress: Not on file  Relationships  . Social connections:    Talks on phone: Not on file    Gets together: Not on file    Attends religious service: Not on file    Active member of club or organization: Not on file    Attends meetings of clubs or organizations: Not on file    Relationship status: Not on file  Other Topics Concern  . Not on file  Social History Narrative   Living home with husband, [name].  Wheelchair bound, Husband walks her around house.  Children Grown 2.  Caffiene- rare.      Allergies:  Allergies  Allergen Reactions  . Sulfa Antibiotics Anaphylaxis  . Ibuprofen Other (See Comments)    unknown  . Shellfish Allergy Swelling    Mouth    Metabolic Disorder Labs: Lab Results  Component Value Date   HGBA1C 7.4 (H) 04/20/2017   MPG 165.68 04/20/2017   MPG 180 09/19/2015   No results found for: PROLACTIN Lab Results  Component Value Date   CHOL 127 09/11/2013   TRIG 45 09/11/2013   HDL 59 09/11/2013   CHOLHDL 2.2 09/11/2013   VLDL 9 09/11/2013   LDLCALC 59 09/11/2013   Lab Results  Component Value Date   TSH 1.017 04/30/2017   TSH 0.529 04/27/2017    Therapeutic Level Labs: No results found for: LITHIUM No results found for: VALPROATE No components found for:  CBMZ  Current Medications: Current Outpatient Medications  Medication Sig Dispense Refill  . ALPRAZolam (XANAX) 0.25 MG tablet Take 1 tablet (0.25 mg total) by mouth 3 (three) times daily as needed. for anxiety 90 tablet 2  . bisacodyl (DULCOLAX) 5 MG EC tablet Take 5 mg by mouth daily.    Marland Kitchen diltiazem (CARDIZEM CD) 180 MG 24 hr capsule Take 1 capsule (180 mg total) by mouth daily. 90 capsule 3  . insulin NPH Human (HUMULIN N,NOVOLIN N) 100 UNIT/ML injection Inject 0.18 mLs (18 Units total) into the skin 2 (two) times daily at 8 am and 10 pm. 10 mL 11  . Multiple  Vitamin (MULTIVITAMIN WITH MINERALS) TABS tablet Take 1 tablet by mouth daily.    Marland Kitchen PARoxetine (PAXIL) 40 MG tablet Take 1 tablet (40 mg total) by mouth at bedtime. 90 tablet 2  . risperiDONE (RISPERDAL) 2 MG tablet Take 1  tablet (2 mg total) by mouth at bedtime. 90 tablet 2  . rosuvastatin (CRESTOR) 10 MG tablet Take 1 tablet (10 mg total) by mouth every evening. 90 tablet 3  . traZODone (DESYREL) 50 MG tablet Take 1 tablet (50 mg total) by mouth at bedtime. 90 tablet 2  . potassium chloride (K-DUR) 10 MEQ tablet Take by mouth.     Current Facility-Administered Medications  Medication Dose Route Frequency Provider Last Rate Last Dose  . aspirin EC tablet 81 mg  81 mg Oral Daily Garvin Fila, MD         Musculoskeletal: Strength & Muscle Tone: decreased and atrophy Gait & Station: unable to stand Patient leans: N/A  Psychiatric Specialty Exam: Review of Systems  Unable to perform ROS: Medical condition    Blood pressure (!) 152/94, pulse 81, height 5\' 9"  (1.753 m), SpO2 98 %.Body mass index is 16.51 kg/m.  General Appearance: Casual and Fairly Groomed  Eye Contact:  None  Speech:  none  Volume:  Decreased  Mood:  NA  Affect:  Flat  Thought Process:  NA  Orientation:  NA  Thought Content: NA   Suicidal Thoughts:  No  Homicidal Thoughts:  No  Memory:  NA  Judgement:  Impaired  Insight:  Lacking  Psychomotor Activity:  Decreased and Flacid  Concentration:  Concentration: Poor and Attention Span: Poor  Recall:  Poor  Fund of Knowledge: Poor  Language: Poor  Akathisia:  No  Handed:  Right  AIMS (if indicated): not done  Assets:  Resilience Social Support  ADL's:  Impaired  Cognition: Impaired,  Severe  Sleep:  Good   Screenings:   Assessment and Plan: This patient is an unfortunate 67 year old female who is totally impaired due to neurological disease.  Her husband is her sole caregiver and does not want it any other way.  He thinks the medications are still  helped her and that she would be worse without them.  Therefore she will continue Xanax 0.25 mg 3 times daily as needed for anxiety, trazodone 50 mg at bedtime for sleep, Risperdal 2 mg daily at bedtime for psychotic symptoms and Paxil 40 mg daily for depression.  She will return to see me in 6 months   Levonne Spiller, MD 01/03/2018, 10:22 AM

## 2018-01-04 ENCOUNTER — Ambulatory Visit: Payer: Medicare HMO | Admitting: Cardiovascular Disease

## 2018-01-04 ENCOUNTER — Encounter: Payer: Self-pay | Admitting: Cardiovascular Disease

## 2018-01-04 VITALS — BP 118/70 | HR 96 | Ht 68.0 in

## 2018-01-04 DIAGNOSIS — I10 Essential (primary) hypertension: Secondary | ICD-10-CM

## 2018-01-04 DIAGNOSIS — E7800 Pure hypercholesterolemia, unspecified: Secondary | ICD-10-CM

## 2018-01-04 DIAGNOSIS — R9431 Abnormal electrocardiogram [ECG] [EKG]: Secondary | ICD-10-CM

## 2018-01-04 DIAGNOSIS — I480 Paroxysmal atrial fibrillation: Secondary | ICD-10-CM | POA: Diagnosis not present

## 2018-01-04 NOTE — Patient Instructions (Signed)
Medication Instructions:  Dr Sallyanne Kuster has recommended making the following medication changes: 1. STOP Crestor (Rosuvastatin)  If you need a refill on your cardiac medications before your next appointment, please call your pharmacy.   Follow-Up: At [facility], you and your health needs are our priority.  As part of our continuing mission to provide you with exceptional heart care, we have created designated Provider Care Teams.  These Care Teams include your primary Cardiologist (physician) and Advanced Practice Providers (APPs -  Physician Assistants and Nurse Practitioners) who all work together to provide you with the care you need, when you need it. You will need a follow up appointment in 12 months.  Please call our office 2 months in advance to schedule this appointment.  You may see Sanda Klein, MD or one of the following Advanced Practice Providers on your designated Care Team: [place], [place] . Fabian Sharp, PA-C

## 2018-01-04 NOTE — Progress Notes (Signed)
Cardiology Office Note    Date:  01/04/2018   ID:  Rachel [name], Rachel [name] August 26, 1950, MRN [id number]  PCP:  Iona Beard, MD  Cardiologist:   Sanda Klein, MD   Chief Complaint  Patient presents with  . Follow-up    12 months.    History of Present Illness:  STEPHAINE [name] is a 67 y.o. female with a history of paroxysmal atrial fibrillation, hypertension, hyperlipidemia and diabetes mellitus type 2 on insulin, intracranial lymphoproliferative disorder and schizophrenia presents for routine follow-up. She has a history of previous resection of an occipital mass and Rosai-Dorfman's disease.  She was hospitalized twice in the spring.  In March she had a seizure and had a repeat craniotomy with resection of extra-axial masses, followed by a month-long hospitalization, mostly in ICU.  She had a repeat hospitalization in late April.  Since then she has been at home but she is not doing well.  Her level of alertness has decreased substantially.  She rarely speaks.  Oral intake is poor and she has lost a lot of weight, her BMI is now down to 17.  She sleeps most of the day.  She has not had any suggestion of dyspnea and does not have leg edema.  She has not complained of chest pain.  She has had 3 echocardiograms during the last 12 months all of them showing normal left ventricular systolic function despite her previously abnormal ECG.  As before she has prominent R waves in leads V1-V2, left axis deviation with poor R wave progression V3-V6.  No recent atrial fibrillation has been documented.  As far as I can tell there were no episodes of atrial fibrillation during her hospitalization.  At least on the ECGs available for review the rhythm was normal sinus.  She is not on anticoagulation in view of previous intracranial hemorrhage.  As always, her husband is very dedicated and takes care of all of her needs.  In the past he has always seemed very comfortable and that goal.  Today he  looks tired and is clearly more anxious than usual, although he reports that he feels he is in control of the situation.  They have 2 sons which check on them on the weekends.    Past Medical History:  Diagnosis Date  . Blindness   . Diabetes mellitus type I ([place])   . GERD (gastroesophageal reflux disease)   . Hyperlipidemia   . Paroxysmal atrial fibrillation (H[facility])   . Schizophrenia ([place])   . Sleep apnea   . Subarachnoid hemorrhage ([place])   . Systemic hypertension   . Vertigo     Past Surgical History:  Procedure Laterality Date  . BRAIN TUMOR EXCISION    . CRANIOTOMY Left 04/23/2017   Procedure: CRANIOTOMY TUMOR EXCISION;  Surgeon: Earnie Larsson, MD;  Location: [facility];  Service: Neurosurgery;  Laterality: Left;  left  . NM MYOCAR PERF WALL MOTION  02/01/2007   no significant ischemia  . US ECHOCARDIOGRAPHY  12/20/2003   mild mitral annular ca+,mild MR,TR,PI,AOV mildly sclerotic    Current Medications: Outpatient Medications Prior to Visit  Medication Sig Dispense Refill  . ALPRAZolam (XANAX) 0.25 MG tablet Take 1 tablet (0.25 mg total) by mouth 3 (three) times daily as needed. for anxiety 90 tablet 2  . bisacodyl (DULCOLAX) 5 MG EC tablet Take 5 mg by mouth daily.    Marland Kitchen diltiazem (CARDIZEM CD) 180 MG 24 hr capsule Take 1 capsule (180 mg total) by mouth daily.  90 capsule 3  . insulin NPH Human (HUMULIN N,NOVOLIN N) 100 UNIT/ML injection Inject 0.18 mLs (18 Units total) into the skin 2 (two) times daily at 8 am and 10 pm. 10 mL 11  . Multiple Vitamin (MULTIVITAMIN WITH MINERALS) TABS tablet Take 1 tablet by mouth daily.    Marland Kitchen PARoxetine (PAXIL) 40 MG tablet Take 1 tablet (40 mg total) by mouth at bedtime. 90 tablet 2  . potassium chloride (K-DUR) 10 MEQ tablet Take by mouth.    . risperiDONE (RISPERDAL) 2 MG tablet Take 1 tablet (2 mg total) by mouth at bedtime. 90 tablet 2  . traZODone (DESYREL) 50 MG tablet Take 1 tablet (50 mg total) by mouth at bedtime. 90 tablet 2  . rosuvastatin  (CRESTOR) 10 MG tablet Take 1 tablet (10 mg total) by mouth every evening. 90 tablet 3   Facility-Administered Medications Prior to Visit  Medication Dose Route Frequency Provider Last Rate Last Dose  . aspirin EC tablet 81 mg  81 mg Oral Daily Garvin Fila, MD         Allergies:   Sulfa antibiotics; Ibuprofen; and Shellfish allergy   Social History   Socioeconomic History  . Marital status: Married    Spouse name: Not on file  . Number of children: Not on file  . Years of education: Not on file  . Highest education level: Not on file  Occupational History  . Not on file  Social Needs  . Financial resource strain: Not on file  . Food insecurity:    Worry: Not on file    Inability: Not on file  . Transportation needs:    Medical: Not on file    Non-medical: Not on file  Tobacco Use  . Smoking status: Never Smoker  . Smokeless tobacco: Never Used  Substance and Sexual Activity  . Alcohol use: No  . Drug use: No  . Sexual activity: Not on file  Lifestyle  . Physical activity:    Days per week: Not on file    Minutes per session: Not on file  . Stress: Not on file  Relationships  . Social connections:    Talks on phone: Not on file    Gets together: Not on file    Attends religious service: Not on file    Active member of club or organization: Not on file    Attends meetings of clubs or organizations: Not on file    Relationship status: Not on file  Other Topics Concern  . Not on file  Social History Narrative   Living home with husband, Rachel [name].  Wheelchair bound, Husband walks her around house.  Children Grown 2.  Caffiene- rare.        ROS:   Please see the history of present illness.    ROS unable to obtain review of systems.  Patient is not communicative PHYSICAL EXAM:   VS:  BP 118/70 (BP Location: Right Arm, Patient Position: Sitting, Cuff Size: Normal)   Pulse 96   Ht 5\' 8"  (1.727 m)   BMI 17.00 kg/m     General: Asleep in a wheelchair.  She very  briefly opens her eyes but intense stimulation.  She is cachectic and appears ill. Head: no evidence of trauma, PERRL, EOMI, no exophtalmos or lid lag, no myxedema, no xanthelasma; normal ears, nose and oropharynx Neck: normal jugular venous pulsations and no hepatojugular reflux; brisk carotid pulses without delay and no carotid bruits Chest: clear to auscultation, no  signs of consolidation by percussion or palpation, normal fremitus, symmetrical and full respiratory excursions Cardiovascular: normal position and quality of the apical impulse, regular rhythm, normal first and second heart sounds, no murmurs, rubs or gallops Abdomen: no tenderness or distention, no masses by palpation, no abnormal pulsatility or arterial bruits, normal bowel sounds, no hepatosplenomegaly Extremities: no clubbing, cyanosis or edema; 2+ radial, ulnar and brachial pulses bilaterally; 2+ right femoral, posterior tibial and dorsalis pedis pulses; 2+ left femoral, posterior tibial and dorsalis pedis pulses; no subclavian or femoral bruits.  Appears to have extensive muscle weakness/wasting Neurological: Unable to evaluate Psych: Unable to assess   Wt Readings from Last 3 Encounters:  06/04/17 111 lb 12.4 oz (50.7 kg)  05/21/17 108 lb 0.4 oz (49 kg)  06/16/16 156 lb 9.6 oz (71 kg)      Studies/Labs Reviewed:   EKG:  EKG is not ordered today.  Sinus rhythm, left axis deviation, positive R waves in lead V1-V2 but marked reduction in amplitude anterolateral leads.  No change from previous tracing.  Recent Labs: 04/30/2017: TSH 1.017 05/31/2017: B Natriuretic Peptide 63.0 06/03/2017: Hemoglobin 10.8; Magnesium 1.8; Platelets 268 06/04/2017: ALT 14; BUN 5; Creatinine, Ser <0.30; Potassium 3.6; Sodium 140   Lipid Panel    Component Value Date/Time   CHOL 127 09/11/2013 1101   TRIG 45 09/11/2013 1101   HDL 59 09/11/2013 1101   CHOLHDL 2.2 09/11/2013 1101   VLDL 9 09/11/2013 1101   LDLCALC 59 09/11/2013 1101     Additional studies/ records that were reviewed today include:  Records from recent hospitalization, [facility]    ASSESSMENT:    1. Paroxysmal atrial fibrillation (HCC)   2. Essential hypertension   3. Hypercholesteremia   4. Abnormal electrocardiogram      PLAN:  In order of problems listed above:  1. Afib: Asymptomatic, not documented in many years.  She cannot receive anticoagulation due to intracranial hemorrhage/intracranial tumors. 2. HTN: Blood pressure in normal range.  Continue diltiazem in place she has rapid ventricular rates. 3. HLP: On statin with very low LDL at her last appointment and now with massive weight loss.  We will stop her statin. 4. Abnormal EKG: This is secondary to a conduction abnormality.  3 consecutive echocardiograms have not shown evidence of regional wall abnormalities abnormalities in LV function.  Mrs. Petron appears severely chronically ill.  I am afraid that her overall survival is likely to be very limited.  I am also very worried about Mr. Dwyer who seems to be exhausted.  He puts on a good face and reports that he can manage, but I think he needs support.  Encouraged him to call upon his family and friends, church congregation and consider a palliative care consultation.   Medication Adjustments/Labs and Tests Ordered: Current medicines are reviewed at length with the patient today.  Concerns regarding medicines are outlined above.  Medication changes, Labs and Tests ordered today are listed in the Patient Instructions below. Patient Instructions  Medication Instructions:  Dr Sallyanne Kuster has recommended making the following medication changes: 1. STOP Crestor (Rosuvastatin)  If you need a refill on your cardiac medications before your next appointment, please call your pharmacy.   Follow-Up: At [facility], you and your health needs are our priority.  As part of our continuing mission to provide you with  exceptional heart care, we have created designated Provider Care Teams.  These Care Teams include your primary Cardiologist (physician) and Advanced Practice Providers (APPs -  Physician Assistants and Nurse Practitioners) who all work together to provide you with the care you need, when you need it. You will need a follow up appointment in 12 months.  Please call our office 2 months in advance to schedule this appointment.  You may see Sanda Klein, MD or one of the following Advanced Practice Providers on your designated Care Team: [place], [place] . Fabian Sharp, PA-C    Signed, Sanda Klein, MD  01/04/2018 11:06 AM    [address], [place], [place]  [address] Phone: [phone]; Fax: [phone]

## 2018-03-01 DIAGNOSIS — E118 Type 2 diabetes mellitus with unspecified complications: Secondary | ICD-10-CM | POA: Diagnosis not present

## 2018-03-01 DIAGNOSIS — D329 Benign neoplasm of meninges, unspecified: Secondary | ICD-10-CM | POA: Diagnosis not present

## 2018-03-01 DIAGNOSIS — Z794 Long term (current) use of insulin: Secondary | ICD-10-CM | POA: Diagnosis not present

## 2018-03-01 DIAGNOSIS — F209 Schizophrenia, unspecified: Secondary | ICD-10-CM | POA: Diagnosis not present

## 2018-03-01 DIAGNOSIS — I10 Essential (primary) hypertension: Secondary | ICD-10-CM | POA: Diagnosis not present

## 2018-03-03 ENCOUNTER — Other Ambulatory Visit: Payer: Self-pay | Admitting: Cardiovascular Disease

## 2018-04-10 DEATH — deceased

## 2018-07-05 ENCOUNTER — Ambulatory Visit (HOSPITAL_COMMUNITY): Payer: Medicare HMO | Admitting: Psychiatry

## 2019-10-17 IMAGING — DX DG CHEST 1V PORT
1 series · 1 of 1 positions shown · non-contrast
Comparison: Three days ago

CLINICAL DATA: Respiratory failure

EXAM:
PORTABLE CHEST 1 VIEW

[chest ap]
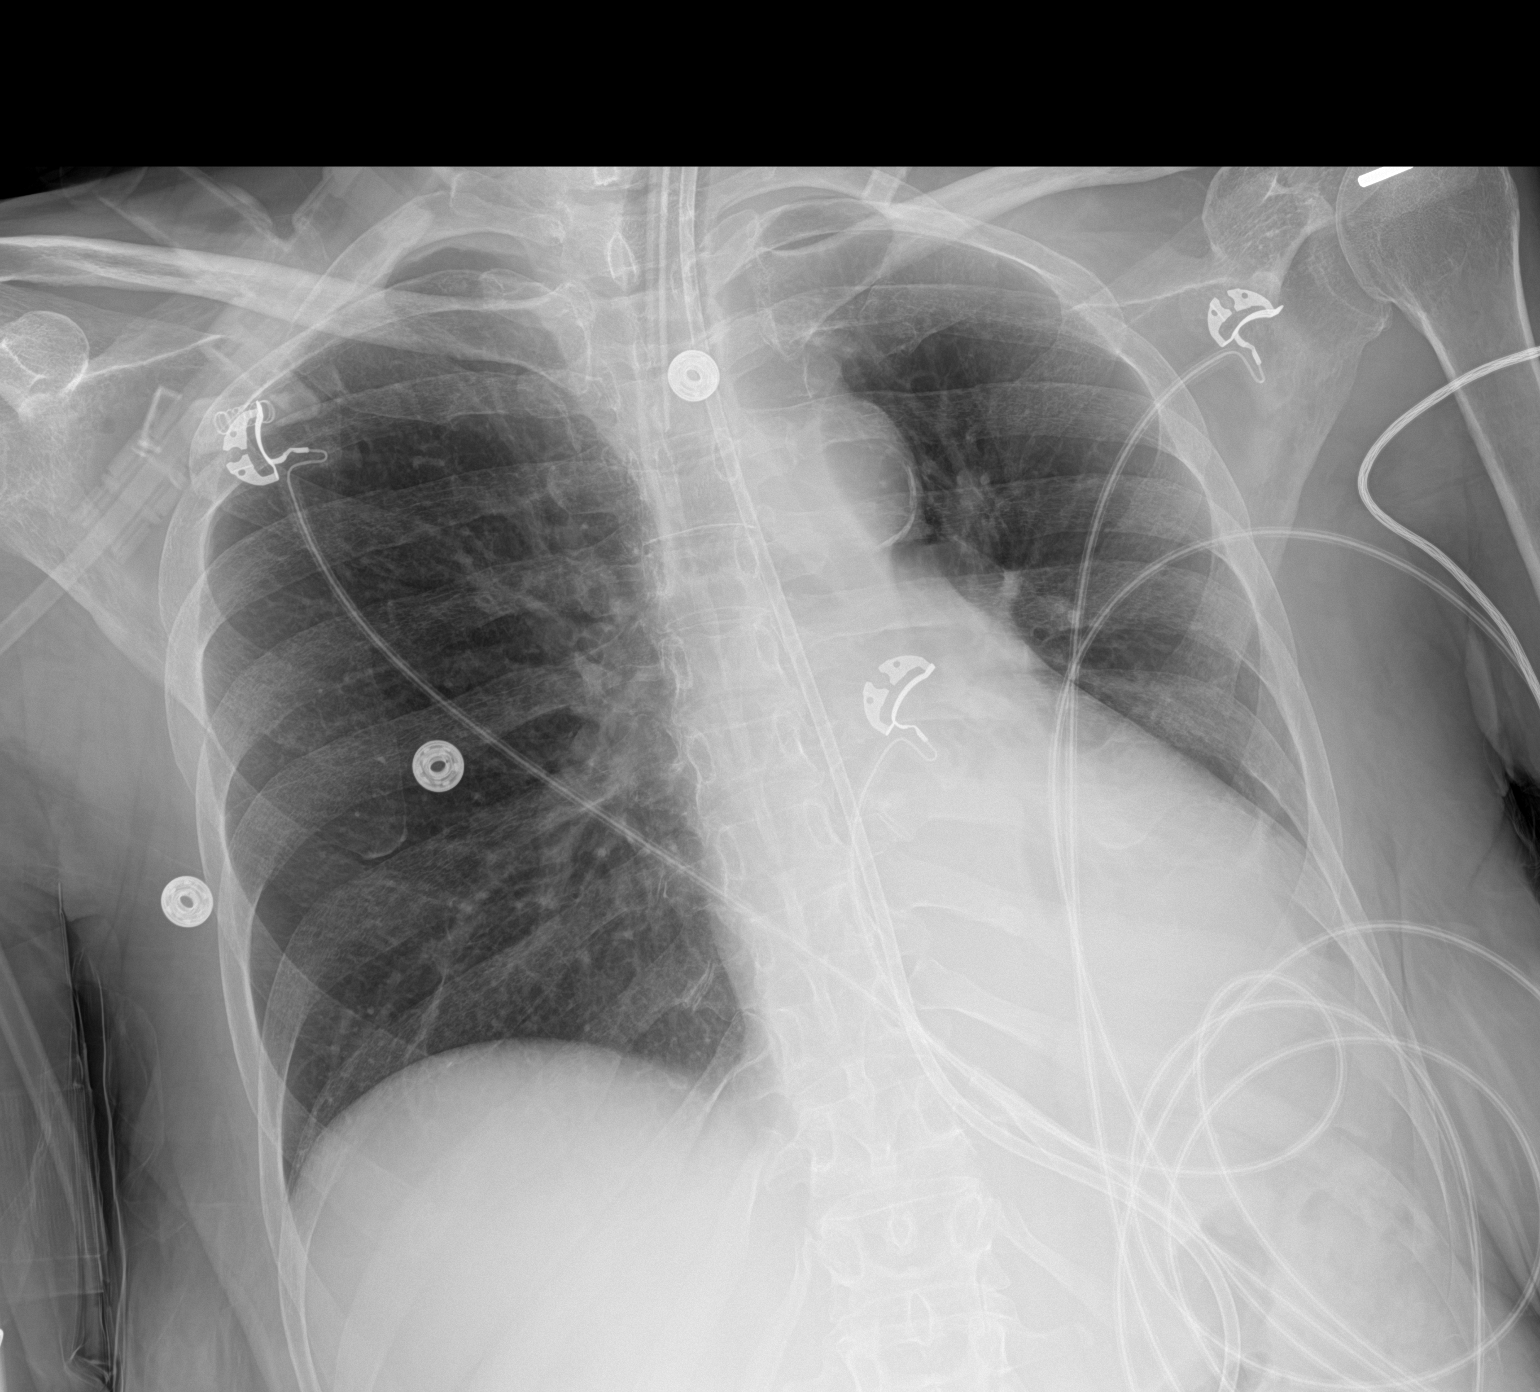

[1 of 1 positions shown; findings below may reference images not displayed]

FINDINGS: New retrocardiac opacity with volume loss. Normal heart size and
mediastinal contours. Endotracheal tube tip is between the
clavicular heads and carina. Feeding tube at least reaches the
stomach. The left lung is clear. Artifact from EKG leads.
IMPRESSION: Interval left lower lobe collapse.

## 2019-10-19 IMAGING — CR DG CHEST 1V PORT
1 series · 1 of 1 positions shown · non-contrast
Comparison: May 08, 2017

CLINICAL DATA: Hypoxia

EXAM:
PORTABLE CHEST 1 VIEW

[AP]
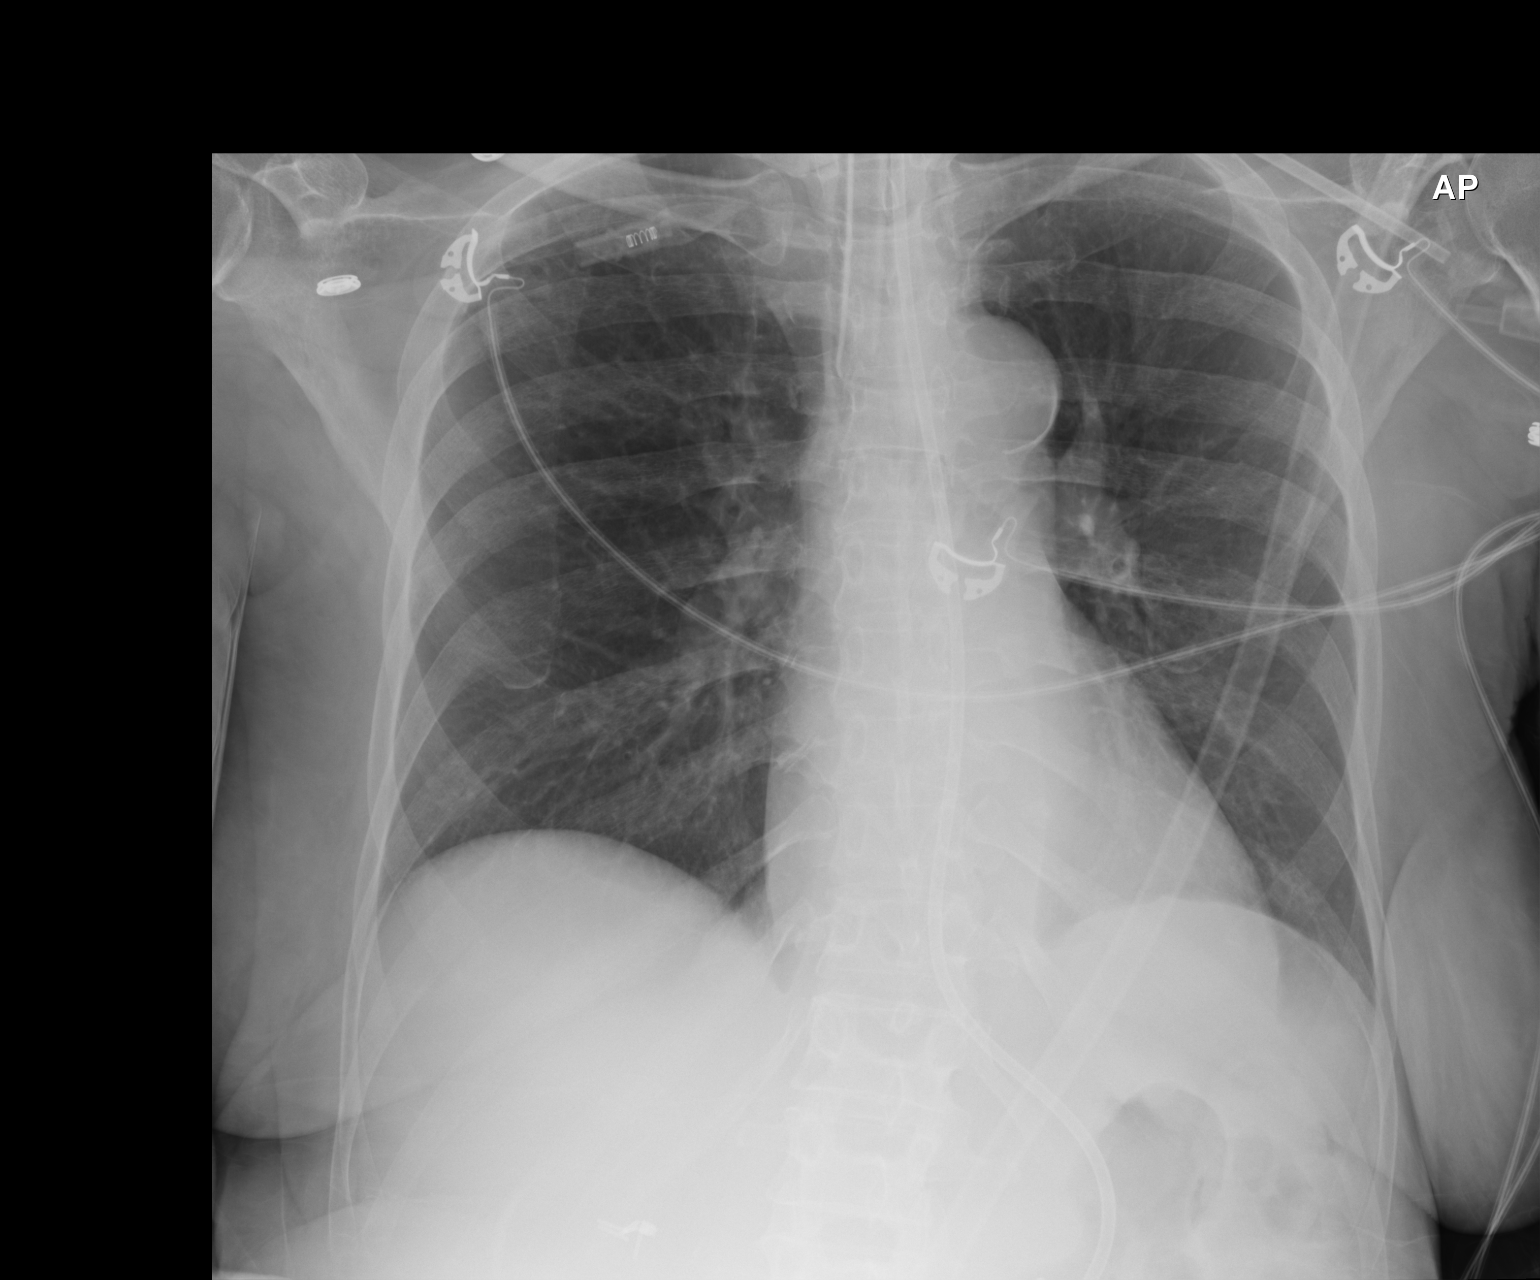

[1 of 1 positions shown; findings below may reference images not displayed]

FINDINGS: Endotracheal tube tip is 2.3 cm above the carina. Feeding tube tip
is below the diaphragm. No pneumothorax. There is no edema or
consolidation. The heart size and pulmonary vascularity are normal.
No adenopathy. There is aortic atherosclerosis.
IMPRESSION: Positions as described without pneumothorax. No edema or
consolidation. There is aortic atherosclerosis.

Aortic Atherosclerosis ([id number]).
